# Patient Record
Sex: Female | Born: 1950
Health system: Southern US, Community
[De-identification: ages and names within clinical notes are randomized; demographics above are authoritative.]

## PROBLEM LIST (undated history)

## (undated) DIAGNOSIS — Z9889 Other specified postprocedural states: Secondary | ICD-10-CM

## (undated) DIAGNOSIS — Z8719 Personal history of other diseases of the digestive system: Secondary | ICD-10-CM

## (undated) DIAGNOSIS — I4891 Unspecified atrial fibrillation: Secondary | ICD-10-CM

## (undated) DIAGNOSIS — Z95810 Presence of automatic (implantable) cardiac defibrillator: Secondary | ICD-10-CM

## (undated) DIAGNOSIS — M1990 Unspecified osteoarthritis, unspecified site: Secondary | ICD-10-CM

## (undated) DIAGNOSIS — I959 Hypotension, unspecified: Secondary | ICD-10-CM

## (undated) DIAGNOSIS — R112 Nausea with vomiting, unspecified: Secondary | ICD-10-CM

## (undated) DIAGNOSIS — I639 Cerebral infarction, unspecified: Secondary | ICD-10-CM

## (undated) DIAGNOSIS — I447 Left bundle-branch block, unspecified: Secondary | ICD-10-CM

## (undated) DIAGNOSIS — I5020 Unspecified systolic (congestive) heart failure: Secondary | ICD-10-CM

## (undated) DIAGNOSIS — I340 Nonrheumatic mitral (valve) insufficiency: Secondary | ICD-10-CM

## (undated) DIAGNOSIS — E785 Hyperlipidemia, unspecified: Secondary | ICD-10-CM

## (undated) DIAGNOSIS — K219 Gastro-esophageal reflux disease without esophagitis: Secondary | ICD-10-CM

## (undated) HISTORY — PX: KNEE ARTHROSCOPY: SHX127

## (undated) HISTORY — PX: TONSILLECTOMY: SUR1361

---

## 1997-10-30 ENCOUNTER — Emergency Department (HOSPITAL_COMMUNITY): Admission: EM | Admit: 1997-10-30 | Discharge: 1997-10-30 | Payer: Self-pay | Admitting: Emergency Medicine

## 2001-09-23 ENCOUNTER — Encounter: Payer: Self-pay | Admitting: Emergency Medicine

## 2001-09-23 ENCOUNTER — Emergency Department (HOSPITAL_COMMUNITY): Admission: EM | Admit: 2001-09-23 | Discharge: 2001-09-23 | Payer: Self-pay | Admitting: Emergency Medicine

## 2001-10-08 ENCOUNTER — Emergency Department (HOSPITAL_COMMUNITY): Admission: EM | Admit: 2001-10-08 | Discharge: 2001-10-08 | Payer: Self-pay | Admitting: Emergency Medicine

## 2001-11-25 ENCOUNTER — Emergency Department (HOSPITAL_COMMUNITY): Admission: EM | Admit: 2001-11-25 | Discharge: 2001-11-26 | Payer: Self-pay | Admitting: Emergency Medicine

## 2002-02-03 ENCOUNTER — Emergency Department (HOSPITAL_COMMUNITY): Admission: EM | Admit: 2002-02-03 | Discharge: 2002-02-03 | Payer: Self-pay | Admitting: Emergency Medicine

## 2003-11-27 ENCOUNTER — Emergency Department (HOSPITAL_COMMUNITY): Admission: EM | Admit: 2003-11-27 | Discharge: 2003-11-27 | Payer: Self-pay | Admitting: Emergency Medicine

## 2004-08-17 ENCOUNTER — Emergency Department (HOSPITAL_COMMUNITY): Admission: EM | Admit: 2004-08-17 | Discharge: 2004-08-17 | Payer: Self-pay | Admitting: Emergency Medicine

## 2005-04-29 ENCOUNTER — Emergency Department (HOSPITAL_COMMUNITY): Admission: EM | Admit: 2005-04-29 | Discharge: 2005-04-29 | Payer: Self-pay | Admitting: Emergency Medicine

## 2007-04-15 ENCOUNTER — Ambulatory Visit: Payer: Self-pay | Admitting: Internal Medicine

## 2007-04-19 ENCOUNTER — Ambulatory Visit: Payer: Self-pay | Admitting: *Deleted

## 2007-07-31 ENCOUNTER — Encounter: Payer: Self-pay | Admitting: Family Medicine

## 2007-07-31 ENCOUNTER — Ambulatory Visit: Payer: Self-pay | Admitting: Internal Medicine

## 2007-07-31 LAB — CONVERTED CEMR LAB
Chlamydia, DNA Probe: NEGATIVE
GC Probe Amp, Genital: NEGATIVE

## 2007-08-07 ENCOUNTER — Ambulatory Visit (HOSPITAL_COMMUNITY): Admission: RE | Admit: 2007-08-07 | Discharge: 2007-08-07 | Payer: Self-pay | Admitting: Family Medicine

## 2007-08-15 ENCOUNTER — Encounter: Admission: RE | Admit: 2007-08-15 | Discharge: 2007-08-15 | Payer: Self-pay | Admitting: Family Medicine

## 2007-08-29 ENCOUNTER — Ambulatory Visit: Payer: Self-pay | Admitting: Internal Medicine

## 2007-08-29 ENCOUNTER — Encounter: Payer: Self-pay | Admitting: Family Medicine

## 2007-08-29 LAB — CONVERTED CEMR LAB
ALT: 10 units/L (ref 0–35)
AST: 14 units/L (ref 0–37)
Albumin: 4.2 g/dL (ref 3.5–5.2)
Alkaline Phosphatase: 90 units/L (ref 39–117)
BUN: 14 mg/dL (ref 6–23)
Basophils Absolute: 0 10*3/uL (ref 0.0–0.1)
Basophils Relative: 0 % (ref 0–1)
CO2: 24 meq/L (ref 19–32)
Calcium: 9.3 mg/dL (ref 8.4–10.5)
Chloride: 108 meq/L (ref 96–112)
Cholesterol: 172 mg/dL (ref 0–200)
Creatinine, Ser: 0.74 mg/dL (ref 0.40–1.20)
Eosinophils Absolute: 0.3 10*3/uL (ref 0.0–0.7)
Eosinophils Relative: 4 % (ref 0–5)
Glucose, Bld: 99 mg/dL (ref 70–99)
HCT: 39 % (ref 36.0–46.0)
HDL: 52 mg/dL (ref >39)
Helicobacter Pylori Antibody-IgG: >8 — ABNORMAL HIGH
Hemoglobin: 12.2 g/dL (ref 12.0–15.0)
LDL Cholesterol: 101 mg/dL — ABNORMAL HIGH (ref 0–99)
Lymphocytes Relative: 23 % (ref 12–46)
Lymphs Abs: 1.6 10*3/uL (ref 0.7–4.0)
MCHC: 31.3 g/dL (ref 30.0–36.0)
MCV: 93.3 fL (ref 78.0–100.0)
Monocytes Absolute: 0.5 10*3/uL (ref 0.1–1.0)
Monocytes Relative: 7 % (ref 3–12)
Neutro Abs: 4.5 10*3/uL (ref 1.7–7.7)
Neutrophils Relative %: 66 % (ref 43–77)
Platelets: 199 10*3/uL (ref 150–400)
Potassium: 4.1 meq/L (ref 3.5–5.3)
RBC: 4.18 M/uL (ref 3.87–5.11)
RDW: 13.9 % (ref 11.5–15.5)
Sodium: 142 meq/L (ref 135–145)
TSH: 2.472 microintl units/mL (ref 0.350–4.50)
Total Bilirubin: 0.4 mg/dL (ref 0.3–1.2)
Total CHOL/HDL Ratio: 3.3
Total Protein: 6.9 g/dL (ref 6.0–8.3)
Triglycerides: 97 mg/dL (ref <150)
VLDL: 19 mg/dL (ref 0–40)
WBC: 6.8 10*3/uL (ref 4.0–10.5)

## 2007-09-03 ENCOUNTER — Ambulatory Visit: Payer: Self-pay | Admitting: Internal Medicine

## 2007-09-16 ENCOUNTER — Ambulatory Visit: Payer: Self-pay | Admitting: Internal Medicine

## 2007-09-16 LAB — CONVERTED CEMR LAB
ALT: 9 units/L (ref 0–35)
AST: 14 units/L (ref 0–37)
Albumin: 4.3 g/dL (ref 3.5–5.2)
Alkaline Phosphatase: 83 units/L (ref 39–117)
BUN: 15 mg/dL (ref 6–23)
Basophils Absolute: 0 10*3/uL (ref 0.0–0.1)
Basophils Relative: 0 % (ref 0–1)
CO2: 22 meq/L (ref 19–32)
Calcium: 9.7 mg/dL (ref 8.4–10.5)
Chloride: 106 meq/L (ref 96–112)
Cholesterol: 171 mg/dL (ref 0–200)
Creatinine, Ser: 0.84 mg/dL (ref 0.40–1.20)
Eosinophils Absolute: 0.2 10*3/uL (ref 0.0–0.7)
Eosinophils Relative: 3 % (ref 0–5)
Glucose, Bld: 135 mg/dL — ABNORMAL HIGH (ref 70–99)
HCT: 37.2 % (ref 36.0–46.0)
HDL: 49 mg/dL (ref >39)
Hemoglobin: 12.1 g/dL (ref 12.0–15.0)
LDL Cholesterol: 111 mg/dL — ABNORMAL HIGH (ref 0–99)
Lymphocytes Relative: 28 % (ref 12–46)
Lymphs Abs: 1.9 10*3/uL (ref 0.7–4.0)
MCHC: 32.5 g/dL (ref 30.0–36.0)
MCV: 88.6 fL (ref 78.0–100.0)
Monocytes Absolute: 0.5 10*3/uL (ref 0.1–1.0)
Monocytes Relative: 7 % (ref 3–12)
Neutro Abs: 4.2 10*3/uL (ref 1.7–7.7)
Neutrophils Relative %: 62 % (ref 43–77)
Platelets: 221 10*3/uL (ref 150–400)
Potassium: 3.8 meq/L (ref 3.5–5.3)
RBC: 4.2 M/uL (ref 3.87–5.11)
RDW: 13.3 % (ref 11.5–15.5)
Sodium: 142 meq/L (ref 135–145)
Total Bilirubin: 0.8 mg/dL (ref 0.3–1.2)
Total CHOL/HDL Ratio: 3.5
Total Protein: 7.4 g/dL (ref 6.0–8.3)
Triglycerides: 57 mg/dL (ref <150)
VLDL: 11 mg/dL (ref 0–40)
WBC: 6.9 10*3/uL (ref 4.0–10.5)

## 2007-09-19 ENCOUNTER — Encounter: Payer: Self-pay | Admitting: Internal Medicine

## 2007-09-19 LAB — CONVERTED CEMR LAB: Hgb A1c MFr Bld: 5.1 % (ref 4.6–6.1)

## 2008-01-13 ENCOUNTER — Emergency Department (HOSPITAL_COMMUNITY): Admission: EM | Admit: 2008-01-13 | Discharge: 2008-01-13 | Payer: Self-pay | Admitting: Family Medicine

## 2008-11-20 ENCOUNTER — Ambulatory Visit: Payer: Self-pay | Admitting: Family Medicine

## 2008-11-20 ENCOUNTER — Encounter: Payer: Self-pay | Admitting: Family Medicine

## 2008-11-20 LAB — CONVERTED CEMR LAB
ALT: 11 units/L (ref 0–35)
AST: 15 units/L (ref 0–37)
Albumin: 4.6 g/dL (ref 3.5–5.2)
Alkaline Phosphatase: 95 units/L (ref 39–117)
BUN: 15 mg/dL (ref 6–23)
Basophils Absolute: 0 10*3/uL (ref 0.0–0.1)
Basophils Relative: 0 % (ref 0–1)
CO2: 23 meq/L (ref 19–32)
Calcium: 10.2 mg/dL (ref 8.4–10.5)
Chloride: 107 meq/L (ref 96–112)
Creatinine, Ser: 0.71 mg/dL (ref 0.40–1.20)
Eosinophils Absolute: 0.2 10*3/uL (ref 0.0–0.7)
Eosinophils Relative: 2 % (ref 0–5)
Glucose, Bld: 88 mg/dL (ref 70–99)
HCT: 39.6 % (ref 36.0–46.0)
Hemoglobin: 12.1 g/dL (ref 12.0–15.0)
Lymphocytes Relative: 28 % (ref 12–46)
Lymphs Abs: 2.3 10*3/uL (ref 0.7–4.0)
MCHC: 30.6 g/dL (ref 30.0–36.0)
MCV: 93.4 fL (ref 78.0–100.0)
Monocytes Absolute: 0.7 10*3/uL (ref 0.1–1.0)
Monocytes Relative: 8 % (ref 3–12)
Neutro Abs: 5 10*3/uL (ref 1.7–7.7)
Neutrophils Relative %: 62 % (ref 43–77)
Platelets: 225 10*3/uL (ref 150–400)
Potassium: 4 meq/L (ref 3.5–5.3)
Pro B Natriuretic peptide (BNP): 4 pg/mL (ref 0.0–100.0)
RBC: 4.24 M/uL (ref 3.87–5.11)
RDW: 13.6 % (ref 11.5–15.5)
Sodium: 141 meq/L (ref 135–145)
TSH: 4.078 microintl units/mL (ref 0.350–4.500)
Total Bilirubin: 0.6 mg/dL (ref 0.3–1.2)
Total Protein: 7.5 g/dL (ref 6.0–8.3)
Vit D, 25-Hydroxy: 40 ng/mL (ref 30–89)
WBC: 8.2 10*3/uL (ref 4.0–10.5)

## 2009-04-01 ENCOUNTER — Emergency Department (HOSPITAL_COMMUNITY): Admission: EM | Admit: 2009-04-01 | Discharge: 2009-04-01 | Payer: Self-pay | Admitting: Family Medicine

## 2009-10-13 ENCOUNTER — Emergency Department (HOSPITAL_COMMUNITY): Admission: EM | Admit: 2009-10-13 | Discharge: 2009-10-13 | Payer: Self-pay | Admitting: Emergency Medicine

## 2010-01-04 ENCOUNTER — Encounter (INDEPENDENT_AMBULATORY_CARE_PROVIDER_SITE_OTHER): Payer: Self-pay | Admitting: Family Medicine

## 2010-01-04 LAB — CONVERTED CEMR LAB
ALT: 15 units/L (ref 0–35)
AST: 18 units/L (ref 0–37)
Albumin: 4.1 g/dL (ref 3.5–5.2)
Alkaline Phosphatase: 109 units/L (ref 39–117)
BUN: 13 mg/dL (ref 6–23)
Basophils Absolute: 0 10*3/uL (ref 0.0–0.1)
Basophils Relative: 0 % (ref 0–1)
CO2: 24 meq/L (ref 19–32)
Calcium: 9.4 mg/dL (ref 8.4–10.5)
Chloride: 105 meq/L (ref 96–112)
Cholesterol: 207 mg/dL — ABNORMAL HIGH (ref 0–200)
Creatinine, Ser: 0.76 mg/dL (ref 0.40–1.20)
Eosinophils Absolute: 0.3 10*3/uL (ref 0.0–0.7)
Eosinophils Relative: 4 % (ref 0–5)
Glucose, Bld: 85 mg/dL (ref 70–99)
HCT: 34.6 % — ABNORMAL LOW (ref 36.0–46.0)
HDL: 43 mg/dL (ref >39)
Hemoglobin: 11.1 g/dL — ABNORMAL LOW (ref 12.0–15.0)
Hgb A1c MFr Bld: 5.5 % (ref <5.7)
LDL Cholesterol: 132 mg/dL — ABNORMAL HIGH (ref 0–99)
Lymphocytes Relative: 28 % (ref 12–46)
Lymphs Abs: 2.6 10*3/uL (ref 0.7–4.0)
MCHC: 32.1 g/dL (ref 30.0–36.0)
MCV: 89.6 fL (ref 78.0–100.0)
Monocytes Absolute: 0.7 10*3/uL (ref 0.1–1.0)
Monocytes Relative: 8 % (ref 3–12)
Neutro Abs: 5.6 10*3/uL (ref 1.7–7.7)
Neutrophils Relative %: 61 % (ref 43–77)
Platelets: 247 10*3/uL (ref 150–400)
Potassium: 4.1 meq/L (ref 3.5–5.3)
RBC: 3.86 M/uL — ABNORMAL LOW (ref 3.87–5.11)
RDW: 14.1 % (ref 11.5–15.5)
Sed Rate: 25 mm/hr — ABNORMAL HIGH (ref 0–22)
Sodium: 144 meq/L (ref 135–145)
Total Bilirubin: 0.4 mg/dL (ref 0.3–1.2)
Total CHOL/HDL Ratio: 4.8
Total Protein: 7 g/dL (ref 6.0–8.3)
Triglycerides: 162 mg/dL — ABNORMAL HIGH (ref <150)
VLDL: 32 mg/dL (ref 0–40)
WBC: 9.2 10*3/uL (ref 4.0–10.5)

## 2010-01-07 ENCOUNTER — Ambulatory Visit (HOSPITAL_COMMUNITY)
Admission: RE | Admit: 2010-01-07 | Discharge: 2010-01-07 | Payer: Self-pay | Source: Home / Self Care | Attending: Internal Medicine | Admitting: Internal Medicine

## 2010-02-07 ENCOUNTER — Encounter: Payer: Self-pay | Admitting: Family Medicine

## 2010-06-27 ENCOUNTER — Ambulatory Visit (INDEPENDENT_AMBULATORY_CARE_PROVIDER_SITE_OTHER): Payer: Self-pay

## 2010-06-27 ENCOUNTER — Inpatient Hospital Stay (INDEPENDENT_AMBULATORY_CARE_PROVIDER_SITE_OTHER)
Admission: RE | Admit: 2010-06-27 | Discharge: 2010-06-27 | Disposition: A | Discharge status: Home / Self Care | Payer: Self-pay | Source: Ambulatory Visit | Attending: Emergency Medicine | Admitting: Emergency Medicine

## 2010-06-27 DIAGNOSIS — M79609 Pain in unspecified limb: Secondary | ICD-10-CM

## 2010-09-29 ENCOUNTER — Inpatient Hospital Stay (INDEPENDENT_AMBULATORY_CARE_PROVIDER_SITE_OTHER)
Admission: RE | Admit: 2010-09-29 | Discharge: 2010-09-29 | Disposition: A | Discharge status: Home / Self Care | Payer: Self-pay | Source: Ambulatory Visit | Attending: Emergency Medicine | Admitting: Emergency Medicine

## 2010-09-29 ENCOUNTER — Ambulatory Visit (INDEPENDENT_AMBULATORY_CARE_PROVIDER_SITE_OTHER): Payer: Self-pay

## 2010-09-29 DIAGNOSIS — M779 Enthesopathy, unspecified: Secondary | ICD-10-CM

## 2010-12-19 ENCOUNTER — Encounter (HOSPITAL_BASED_OUTPATIENT_CLINIC_OR_DEPARTMENT_OTHER)
Admission: RE | Admit: 2010-12-19 | Discharge: 2010-12-19 | Disposition: A | Discharge status: Home / Self Care | Payer: Worker's Compensation | Source: Ambulatory Visit | Attending: Orthopedic Surgery | Admitting: Orthopedic Surgery

## 2010-12-19 ENCOUNTER — Other Ambulatory Visit: Payer: Self-pay | Admitting: Orthopedic Surgery

## 2010-12-19 ENCOUNTER — Encounter (HOSPITAL_BASED_OUTPATIENT_CLINIC_OR_DEPARTMENT_OTHER): Payer: Self-pay | Admitting: *Deleted

## 2010-12-19 NOTE — Progress Notes (Signed)
bmet-ekg done

## 2010-12-21 ENCOUNTER — Encounter (HOSPITAL_BASED_OUTPATIENT_CLINIC_OR_DEPARTMENT_OTHER)
Admission: RE | Disposition: A | Discharge status: Home / Self Care | Payer: Self-pay | Source: Ambulatory Visit | Attending: Orthopedic Surgery

## 2010-12-21 ENCOUNTER — Encounter (HOSPITAL_BASED_OUTPATIENT_CLINIC_OR_DEPARTMENT_OTHER): Payer: Self-pay | Admitting: *Deleted

## 2010-12-21 ENCOUNTER — Ambulatory Visit (HOSPITAL_BASED_OUTPATIENT_CLINIC_OR_DEPARTMENT_OTHER): Payer: Worker's Compensation | Admitting: Anesthesiology

## 2010-12-21 ENCOUNTER — Encounter (HOSPITAL_BASED_OUTPATIENT_CLINIC_OR_DEPARTMENT_OTHER): Payer: Self-pay | Admitting: Anesthesiology

## 2010-12-21 ENCOUNTER — Ambulatory Visit (HOSPITAL_BASED_OUTPATIENT_CLINIC_OR_DEPARTMENT_OTHER)
Admission: RE | Admit: 2010-12-21 | Discharge: 2010-12-21 | Disposition: A | Discharge status: Home / Self Care | Payer: Worker's Compensation | Source: Ambulatory Visit | Attending: Orthopedic Surgery | Admitting: Orthopedic Surgery

## 2010-12-21 DIAGNOSIS — K219 Gastro-esophageal reflux disease without esophagitis: Secondary | ICD-10-CM | POA: Insufficient documentation

## 2010-12-21 DIAGNOSIS — M24139 Other articular cartilage disorders, unspecified wrist: Secondary | ICD-10-CM | POA: Insufficient documentation

## 2010-12-21 DIAGNOSIS — M24439 Recurrent dislocation, unspecified wrist: Secondary | ICD-10-CM | POA: Insufficient documentation

## 2010-12-21 DIAGNOSIS — Z5333 Arthroscopic surgical procedure converted to open procedure: Secondary | ICD-10-CM | POA: Insufficient documentation

## 2010-12-21 DIAGNOSIS — Z01812 Encounter for preprocedural laboratory examination: Secondary | ICD-10-CM | POA: Insufficient documentation

## 2010-12-21 HISTORY — DX: Unspecified osteoarthritis, unspecified site: M19.90

## 2010-12-21 HISTORY — DX: Gastro-esophageal reflux disease without esophagitis: K21.9

## 2010-12-21 HISTORY — PX: WRIST ARTHROSCOPY: SHX838

## 2010-12-21 HISTORY — DX: Other specified postprocedural states: Z98.890

## 2010-12-21 HISTORY — DX: Other specified postprocedural states: R11.2

## 2010-12-21 SURGERY — ARTHROSCOPY, WRIST
Anesthesia: General | Site: Wrist | Laterality: Right | Wound class: Clean

## 2010-12-21 MED ORDER — MIDAZOLAM HCL 5 MG/5ML IJ SOLN
INTRAMUSCULAR | Status: DC | PRN
  Administered 2010-12-21: 2 mg via INTRAVENOUS

## 2010-12-21 MED ORDER — PROPOFOL 10 MG/ML IV EMUL
INTRAVENOUS | Status: DC | PRN
  Administered 2010-12-21: 200 mg via INTRAVENOUS

## 2010-12-21 MED ORDER — MORPHINE SULFATE 2 MG/ML IJ SOLN: 0.0500 mg/kg | INTRAMUSCULAR | Status: DC | PRN

## 2010-12-21 MED ORDER — FENTANYL CITRATE 0.05 MG/ML IJ SOLN
INTRAMUSCULAR | Status: DC | PRN
  Administered 2010-12-21: 50 ug via INTRAVENOUS
  Administered 2010-12-21 (×5): 25 ug via INTRAVENOUS

## 2010-12-21 MED ORDER — CEFAZOLIN SODIUM 1-5 GM-% IV SOLN
INTRAVENOUS | Status: DC | PRN
  Administered 2010-12-21: 1 g via INTRAVENOUS

## 2010-12-21 MED ORDER — CHLORHEXIDINE GLUCONATE 4 % EX LIQD: 60.0000 mL | Freq: Once | CUTANEOUS | Status: DC

## 2010-12-21 MED ORDER — ONDANSETRON HCL 4 MG/2ML IJ SOLN
INTRAMUSCULAR | Status: DC | PRN
  Administered 2010-12-21: 4 mg via INTRAVENOUS

## 2010-12-21 MED ORDER — DEXAMETHASONE SODIUM PHOSPHATE 4 MG/ML IJ SOLN
INTRAMUSCULAR | Status: DC | PRN
  Administered 2010-12-21: 10 mg via INTRAVENOUS

## 2010-12-21 MED ORDER — LACTATED RINGERS IV SOLN
INTRAVENOUS | Status: DC
  Administered 2010-12-21 (×2): via INTRAVENOUS

## 2010-12-21 MED ORDER — MIDAZOLAM HCL 2 MG/2ML IJ SOLN: 0.5000 mg | Freq: Once | INTRAMUSCULAR | Status: DC | PRN

## 2010-12-21 MED ORDER — BUPIVACAINE HCL (PF) 0.25 % IJ SOLN
INTRAMUSCULAR | Status: DC | PRN
  Administered 2010-12-21: 10 mL

## 2010-12-21 MED ORDER — OXYCODONE-ACETAMINOPHEN 5-325 MG PO TABS: 1.0000 | ORAL_TABLET | ORAL | Status: AC | PRN

## 2010-12-21 MED ORDER — PROMETHAZINE HCL 25 MG/ML IJ SOLN: 6.2500 mg | INTRAMUSCULAR | Status: DC | PRN

## 2010-12-21 MED ORDER — HYDROMORPHONE HCL PF 1 MG/ML IJ SOLN
0.2500 mg | INTRAMUSCULAR | Status: DC | PRN
  Administered 2010-12-21 (×2): 0.5 mg via INTRAVENOUS

## 2010-12-21 MED ORDER — LIDOCAINE HCL (CARDIAC) 20 MG/ML IV SOLN
INTRAVENOUS | Status: DC | PRN
  Administered 2010-12-21: 40 mg via INTRAVENOUS

## 2010-12-21 SURGICAL SUPPLY — 76 items
BANDAGE GAUZE ELAST BULKY 4 IN (GAUZE/BANDAGES/DRESSINGS) ×2 IMPLANT
BLADE CUDA 2.0 (BLADE) IMPLANT
BLADE EAR TYMPAN 2.5 60D BEAV (BLADE) ×2 IMPLANT
BLADE MINI RND TIP GREEN BEAV (BLADE) ×4 IMPLANT
BLADE SURG 15 STRL LF DISP TIS (BLADE) ×1 IMPLANT
BLADE SURG 15 STRL SS (BLADE) ×1
BNDG COHESIVE 3X5 TAN STRL LF (GAUZE/BANDAGES/DRESSINGS) ×4 IMPLANT
BNDG ESMARK 4X9 LF (GAUZE/BANDAGES/DRESSINGS) ×2 IMPLANT
BUR CUDA 2.9 (BURR) IMPLANT
BUR FULL RADIUS 2.0 (BURR) IMPLANT
BUR FULL RADIUS 2.9 (BURR) ×2 IMPLANT
BUR GATOR 2.9 (BURR) IMPLANT
BUR SPHERICAL 2.9 (BURR) IMPLANT
CANISTER OMNI JUG 16 LITER (MISCELLANEOUS) IMPLANT
CANISTER SUCTION 1200CC (MISCELLANEOUS) ×2 IMPLANT
CANISTER SUCTION 2500CC (MISCELLANEOUS) IMPLANT
CHLORAPREP W/TINT 26ML (MISCELLANEOUS) ×2 IMPLANT
CLOTH BEACON ORANGE TIMEOUT ST (SAFETY) ×2 IMPLANT
CORDS BIPOLAR (ELECTRODE) ×2 IMPLANT
COVER MAYO STAND STRL (DRAPES) ×4 IMPLANT
COVER TABLE BACK 60X90 (DRAPES) ×2 IMPLANT
CUFF TOURNIQUET SINGLE 18IN (TOURNIQUET CUFF) ×2 IMPLANT
DRAPE EXTREMITY T 121X128X90 (DRAPE) ×2 IMPLANT
DRAPE OEC MINIVIEW 54X84 (DRAPES) IMPLANT
DRAPE SURG 17X23 STRL (DRAPES) ×2 IMPLANT
DRSG KUZMA FLUFF (GAUZE/BANDAGES/DRESSINGS) IMPLANT
ELECT SMALL JOINT 90D BASC (ELECTRODE) IMPLANT
GAUZE XEROFORM 1X8 LF (GAUZE/BANDAGES/DRESSINGS) ×2 IMPLANT
GLOVE BIO SURGEON STRL SZ 6.5 (GLOVE) IMPLANT
GLOVE BIOGEL PI IND STRL 6.5 (GLOVE) ×1 IMPLANT
GLOVE BIOGEL PI IND STRL 7.0 (GLOVE) ×2 IMPLANT
GLOVE BIOGEL PI INDICATOR 6.5 (GLOVE) ×1
GLOVE BIOGEL PI INDICATOR 7.0 (GLOVE) ×2
GLOVE ECLIPSE 6.5 STRL STRAW (GLOVE) ×8 IMPLANT
GLOVE SURG ORTHO 8.0 STRL STRW (GLOVE) ×2 IMPLANT
GOWN BRE IMP PREV XXLGXLNG (GOWN DISPOSABLE) ×2 IMPLANT
GOWN PREVENTION PLUS XLARGE (GOWN DISPOSABLE) ×8 IMPLANT
IV NS IRRIG 3000ML ARTHROMATIC (IV SOLUTION) ×2 IMPLANT
NDL SAFETY ECLIPSE 18X1.5 (NEEDLE) ×2 IMPLANT
NEEDLE HYPO 18GX1.5 SHARP (NEEDLE) ×2
NEEDLE HYPO 22GX1.5 SAFETY (NEEDLE) ×2 IMPLANT
NEEDLE SPNL 18GX3.5 QUINCKE PK (NEEDLE) IMPLANT
NEEDLE TUOHY 20GX3.5 (NEEDLE) IMPLANT
NS IRRIG 1000ML POUR BTL (IV SOLUTION) IMPLANT
PACK BASIN DAY SURGERY FS (CUSTOM PROCEDURE TRAY) ×2 IMPLANT
PAD CAST 3X4 CTTN HI CHSV (CAST SUPPLIES) ×2 IMPLANT
PADDING CAST ABS 3INX4YD NS (CAST SUPPLIES) ×1
PADDING CAST ABS 4INX4YD NS (CAST SUPPLIES) ×1
PADDING CAST ABS COTTON 3X4 (CAST SUPPLIES) ×1 IMPLANT
PADDING CAST ABS COTTON 4X4 ST (CAST SUPPLIES) ×1 IMPLANT
PADDING CAST COTTON 3X4 STRL (CAST SUPPLIES) ×2
ROUTER HOODED VORTEX 2.9MM (BLADE) IMPLANT
SET ARTHROSCOPY TUBING (MISCELLANEOUS) ×1
SET ARTHROSCOPY TUBING LN (MISCELLANEOUS) ×1 IMPLANT
SET EXT MALE ROTATING LL 32IN (MISCELLANEOUS) ×2 IMPLANT
SET SM JOINT TUBING/CANN (CANNULA) IMPLANT
SLEEVE SCD COMPRESS KNEE MED (MISCELLANEOUS) ×2 IMPLANT
SPLINT PLASTER CAST XFAST 3X15 (CAST SUPPLIES) ×30 IMPLANT
SPLINT PLASTER XTRA FASTSET 3X (CAST SUPPLIES) ×30
SPONGE GAUZE 4X4 12PLY (GAUZE/BANDAGES/DRESSINGS) ×2 IMPLANT
STOCKINETTE 4X48 STRL (DRAPES) ×2 IMPLANT
SUCTION FRAZIER TIP 10 FR DISP (SUCTIONS) IMPLANT
SUT MERSILENE 4 0 P 3 (SUTURE) IMPLANT
SUT PDS AB 2-0 CT2 27 (SUTURE) IMPLANT
SUT STEEL 4 0 (SUTURE) ×2 IMPLANT
SUT VIC AB 2-0 PS2 27 (SUTURE) IMPLANT
SUT VIC AB 4-0 P2 18 (SUTURE) ×2 IMPLANT
SUT VICRYL 4-0 PS2 18IN ABS (SUTURE) IMPLANT
SUT VICRYL RAPID 5 0 P 3 (SUTURE) IMPLANT
SUT VICRYL RAPIDE 4/0 PS 2 (SUTURE) ×2 IMPLANT
SYR BULB 3OZ (MISCELLANEOUS) IMPLANT
SYR CONTROL 10ML LL (SYRINGE) ×2 IMPLANT
TUBE CONNECTING 20X1/4 (TUBING) IMPLANT
UNDERPAD 30X30 INCONTINENT (UNDERPADS AND DIAPERS) ×2 IMPLANT
WAND 1.5 MICROBLATOR (SURGICAL WAND) IMPLANT
WATER STERILE IRR 1000ML POUR (IV SOLUTION) ×2 IMPLANT

## 2010-12-21 NOTE — Anesthesia Procedure Notes (Signed)
Procedure Name: LMA Insertion Date/Time: 12/21/2010 8:50 AM Performed by: Signa Kell Pre-anesthesia Checklist: Patient identified, Emergency Drugs available, Suction available and Patient being monitored Patient Re-evaluated:Patient Re-evaluated prior to inductionOxygen Delivery Method: Circle System Utilized Preoxygenation: Pre-oxygenation with 100% oxygen Intubation Type: IV induction Ventilation: Mask ventilation without difficulty LMA: LMA with gastric port inserted LMA Size: 4.0 Number of attempts: 1 Airway Equipment and Method: bite block Placement Confirmation: positive ETCO2 Tube secured with: Tape Dental Injury: Teeth and Oropharynx as per pre-operative assessment

## 2010-12-21 NOTE — Brief Op Note (Signed)
12/21/2010  11:02 AM  PATIENT:  [name]  60 y.o. female  PRE-OPERATIVE DIAGNOSIS:  tfcc tear on right, subluxating ecu  POST-OPERATIVE DIAGNOSIS:  TFCC tear Right Wrist, Localized Arthritis   PROCEDURE:  Procedure(s): ARTHROSCOPY WRIST  SURGEON:  Surgeon(s): [name], MD  PHYSICIAN ASSISTANT:   ASSISTANTS: none   ANESTHESIA:   local and general  EBL:  Total I/O In: 1300 [I.V.:1300] Out: -   BLOOD ADMINISTERED:none  DRAINS: none   LOCAL MEDICATIONS USED:  MARCAINE 10CC  SPECIMEN:  No Specimen  DISPOSITION OF SPECIMEN:  N/A  COUNTS:  YES  TOURNIQUET:   Total Tourniquet Time Documented: Upper Arm (Right) - 75 minutes  DICTATION: .Other Dictation: Dictation Number [id number]  PLAN OF CARE: Discharge to home after PACU  PATIENT DISPOSITION:  PACU - hemodynamically stable.   Delay start of Pharmacological VTE agent (>24hrs) due to surgical blood loss or risk of bleeding:  {YES/NO/NOT APPLICABLE:20182

## 2010-12-21 NOTE — H&P (Signed)
[name] is a 60 year old right hand dominant female referred by the insurance company. She suffered an injury to her right hand using a hole punch. The injury occurred on 09-27-10. She complains of pain about her entire hand and wrist. She localizes this over the ulnar aspect and to a lesser extent on the radial side of the basilar area of her thumb. She was given Hydrocodone and placed in a splint which she states has not significantly helped. This has been at [facility].  X-rays reveal no fractures.   She has no prior history of injuries. No history of diabetes, thyroid problems, arthritis or gout. She complains of constant severe, throbbing and aching type pain with a feeling of swelling and weakness. Activity makes it worse. It will occasionally awaken her from sleep. She is not complaining of any numbness or tingling. Dorsiflexion increases her pain on the ulnar aspect.   Past Medical History: She has no allergies. She is on Rampadine, Zestril. She has had an arthroscopy of her knee in 1992.   Family Medical History: Positive for diabetes and high BP.  Social History: She does not smoke or drink. She is married and a Conservation officer, nature for [employer]  Review of Systems: Positive for glasses, otherwise negative for 14 points.  .  We have discussed with her arthroscopy of her wrist with debridement, repair triangular fibrocartilage with open reconstruction extensor carpi ulnaris sheath.  The risks and complications have been discussed.  I would not recommend doing anything to the ulnar nerve, this is to her right wrist.  She is scheduled for arthroscopy right wrist open reconstruction ECU tendon sheath with repair triangular fibrocartilage right wrist as outpatient at [facility].  She is allowed one-handed work if available, if not, out.  She is given a refill of Tylenol-3 #30, no refills.  [name] is an 60 y.o. female.   Chief Complaint: rt wrist pain HPI: see above  Past Medical  History  Diagnosis Date  . GERD (gastroesophageal reflux disease)   . Arthritis   . Complication of anesthesia   . PONV (postoperative nausea and vomiting)   . No pertinent past medical history     Past Surgical History  Procedure Date  . Tonsillectomy   . Knee arthroscopy     rt    History reviewed. No pertinent family history. Social History:  reports that she has never smoked. She does not have any smokeless tobacco history on file. She reports that she drinks alcohol. She reports that she does not use illicit drugs.  Allergies: No Known Allergies  Medications Prior to Admission  Medication Dose Route Frequency Provider Last Rate Last Dose  . lactated ringers infusion   Intravenous Continuous Zenon Mayo, MD 10 mL/hr at 12/21/10 [phone]     Medications Prior to Admission  Medication Sig Dispense Refill  . acetaminophen-codeine (TYLENOL #3) 300-30 MG per tablet Take 1 tablet by mouth every 4 (four) hours as needed.        . famotidine (PEPCID) 20 MG tablet Take 20 mg by mouth 2 (two) times daily as needed.        [name] levocetirizine (XYZAL) 5 MG tablet Take 5 mg by mouth 1 day or 1 dose.          Results for orders placed during the hospital encounter of 12/21/10 (from the past 48 hour(s))  POCT HEMOGLOBIN-HEMACUE     Status: Abnormal   Collection Time   12/21/10  7:24 AM      Component Value Range Comment   Hemoglobin 11.7 (*) 12.0 - 15.0 (g/dL)     No results found.   Pertinent items are noted in HPI.  Blood pressure 135/82, pulse 67, temperature 98.4 F (36.9 C), temperature source Oral, resp. rate 18, height 5' 4.5" (1.638 m), weight 68.04 kg (150 lb), SpO2 100.00%.  General appearance: alert, cooperative and appears stated age Head: Normocephalic, without obvious abnormality Neck: no adenopathy Resp: clear to auscultation bilaterally Cardio: regular rate and rhythm, S1, S2 normal, no murmur, click, rub or gallop GI: soft, non-tender; bowel sounds normal;  no masses,  no organomegaly Extremities: extremities normal, atraumatic, no cyanosis or edema Pulses: 2+ and symmetric Skin: Skin color, texture, turgor normal. No rashes or lesions Neurologic: Grossly normal Incision/Wound: na  Assessment/Plan Arthroscopy rt writ open repair ECU sheath  Alexandru Moorer R 12/21/2010, 8:34 AM

## 2010-12-21 NOTE — Transfer of Care (Signed)
Immediate Anesthesia Transfer of Care Note  Patient: [name]  Procedure(s) Performed:  ARTHROSCOPY WRIST - right wrist, repair TFCC on right, open reconstruction ECU sheath  Patient Location: PACU  Anesthesia Type: General  Level of Consciousness: sedated  Airway & Oxygen Therapy: Patient Spontanous Breathing and Patient connected to face mask  Post-op Assessment: Report given to PACU RN and Post -op Vital signs reviewed and stable  Post vital signs: Reviewed and stable  Complications: No apparent anesthesia complications

## 2010-12-21 NOTE — Anesthesia Postprocedure Evaluation (Signed)
  Anesthesia Post-op Note  Patient: [name]  Procedure(s) Performed:  ARTHROSCOPY WRIST - right wrist, repair TFCC on right, open reconstruction ECU sheath  Patient Location: PACU  Anesthesia Type: General  Level of Consciousness: awake  Airway and Oxygen Therapy: Patient Spontanous Breathing  Post-op Pain: none  Post-op Assessment: Post-op Vital signs reviewed  Post-op Vital Signs: stable  Complications: No apparent anesthesia complications

## 2010-12-21 NOTE — Op Note (Signed)
dictation number: [id number]

## 2010-12-21 NOTE — Anesthesia Preprocedure Evaluation (Addendum)
Anesthesia Evaluation  Patient identified by MRN, date of birth, ID band Patient awake    Reviewed: Allergy & Precautions, H&P , NPO status   History of Anesthesia Complications (+) PONV  Airway Mallampati: II      Dental  (+) Dental Advisory Given   Pulmonary neg pulmonary ROS,  clear to auscultation        Cardiovascular neg cardio ROS Regular Normal    Neuro/Psych    GI/Hepatic Neg liver ROS, GERD-  ,  Endo/Other  Negative Endocrine ROS  Renal/GU negative Renal ROS     Musculoskeletal negative musculoskeletal ROS (+)   Abdominal   Peds  Hematology negative hematology ROS (+)   Anesthesia Other Findings   Reproductive/Obstetrics                           Anesthesia Physical Anesthesia Plan  ASA: II  Anesthesia Plan: General   Post-op Pain Management:    Induction: Intravenous  Airway Management Planned: LMA  Additional Equipment:   Intra-op Plan:   Post-operative Plan: Extubation in OR  Informed Consent: I have reviewed the patients History and Physical, chart, labs and discussed the procedure including the risks, benefits and alternatives for the proposed anesthesia with the patient or authorized representative who has indicated his/her understanding and acceptance.   Dental advisory given  Plan Discussed with: CRNA  Anesthesia Plan Comments:         Anesthesia Quick Evaluation

## 2010-12-22 NOTE — Op Note (Signed)
[name], [name]               ACCOUNT NO.:  [id number]  MEDICAL RECORD NO.:  [id number]  LOCATION:                                 FACILITY:  PHYSICIAN:  Cindee Salt, M.D.       DATE OF BIRTH:  [birth year]  DATE OF PROCEDURE:  12/21/2010 DATE OF DISCHARGE:                              OPERATIVE REPORT   PREOPERATIVE DIAGNOSIS:  Triangular fibrocartilage complex tear with subluxation of extensor carpi ulnaris tendon sheath, right wrist.  POSTOPERATIVE DIAGNOSIS:  Triangular fibrocartilage complex tear with subluxation of extensor carpi ulnaris tendon sheath, right wrist.  OPERATION:  Arthroscopy of right wrist with debridement of TFCC central tear with repair of peripheral tear, open repair of the ECU sheath and TFCC, right wrist.  SURGEON:  Cindee Salt, M.D.  ANESTHESIA:  General with local infiltration.  PROCEDURE:  The patient was brought to the operating room where a general anesthetic was carried out without difficulty.  She was prepped using ChloraPrep, supine position, with the right arm free.  A 3-minute dry time was allowed.  Time-out taken for patient procedure.  The limb was placed in the arthroscopy tower.  10 pounds traction applied.  The joint inflated with 3-4 portal.  A transverse incision was made, deepened with a hemostat.  Blunt trocar was used to enter the joint. Joint was inspected.  The volar, radial, wrist ligaments were intact. The scapholunate ligament intact.  Cartilage showed no changes.  A tear of the TFCC was noted on its central aspect and also peripherally on the dorsal margin.  A small tear at the lunotriquetral joint was also noted. The midcarpal joint was then inspected after inflation through the ulnar midcarpal portal.  An irrigation catheter placed in the radial midcarpal portal.  The midcarpal joint was inspected.  There were no cartilage changes and no gross instability of scapholunate and lunotriquetral ligaments.  The scope was then  reintroduced centrally in the radial portal.  A debridement performed to the peripheral tear of the TFCC using a full radius shaver.  The central area was not debrided at this time.  The small tear of the lunotriquetral joint was also debrided with the full radius shaver.  The limb was then exsanguinated with an Esmarch bandage.  Tourniquet placed high on the arm, was inflated to 250 mmHg. An incision was made over the ulnar aspect of the wrist, carried down through subcutaneous tissue.  Bleeders were electrocauterized with bipolar.  Dorsal sensory nerve identified and protected.  The ECU sheath was then incised on its most volar aspect, leaving a cuff of tissue for later repair.  The TFCC was then isolated and distal margins.  Using an outside-in Whipple technique, two 18-gauge needles were placed.  These were inspected through the 3-4 portal.  Using the arthroscope, a repair was performed with two 4-0 Vicryl sutures into the peripheral tear. This was not repaired at this time.  A debridement of the TFCC was then performed, taken care to protect the 2 sutures.  The sutures were then tied over the dorsal capsule peripherally stabilizing the triangular fibrocartilage complex on the dorsal aspect.  Wound was irrigated.  The ECU sheath was  then repaired with figure-of-eight 4-0 Vicryl sutures. The subcutaneous tissue was repaired with interrupted 4-0 Vicryl and the skin with interrupted 4-0 Vicryl Rapide sutures.  Local infiltration to each of the incision was then made, 10 mL of Marcaine 0.25% plain was used.  A sterile compressive dressing, long-arm splint applied.  On deflation of the tourniquet, all fingers immediately pinked.  She was taken to the recovery room for observation in satisfactory condition.          ______________________________ Cindee Salt, M.D.     GK/MEDQ  D:  12/21/2010  T:  12/21/2010  Job:  [id number]

## 2010-12-23 ENCOUNTER — Encounter (HOSPITAL_BASED_OUTPATIENT_CLINIC_OR_DEPARTMENT_OTHER): Payer: Self-pay | Admitting: Orthopedic Surgery

## 2012-12-10 ENCOUNTER — Ambulatory Visit: Payer: Worker's Compensation | Attending: Internal Medicine

## 2013-01-20 ENCOUNTER — Ambulatory Visit: Payer: Worker's Compensation | Admitting: Internal Medicine

## 2013-02-21 ENCOUNTER — Ambulatory Visit: Payer: Worker's Compensation | Admitting: Internal Medicine

## 2013-04-09 ENCOUNTER — Ambulatory Visit: Payer: No Typology Code available for payment source | Attending: Internal Medicine | Admitting: Internal Medicine

## 2013-04-09 ENCOUNTER — Telehealth: Payer: Self-pay | Admitting: Internal Medicine

## 2013-04-09 VITALS — BP 149/94 | HR 81 | Temp 98.5°F | Resp 16 | Ht 64.0 in | Wt 181.0 lb

## 2013-04-09 DIAGNOSIS — Z8249 Family history of ischemic heart disease and other diseases of the circulatory system: Secondary | ICD-10-CM | POA: Insufficient documentation

## 2013-04-09 DIAGNOSIS — Z008 Encounter for other general examination: Secondary | ICD-10-CM | POA: Insufficient documentation

## 2013-04-09 DIAGNOSIS — Z0000 Encounter for general adult medical examination without abnormal findings: Secondary | ICD-10-CM | POA: Insufficient documentation

## 2013-04-09 DIAGNOSIS — Z23 Encounter for immunization: Secondary | ICD-10-CM

## 2013-04-09 DIAGNOSIS — K219 Gastro-esophageal reflux disease without esophagitis: Secondary | ICD-10-CM | POA: Insufficient documentation

## 2013-04-09 LAB — COMPLETE METABOLIC PANEL WITH GFR
ALK PHOS: 100 U/L (ref 39–117)
ALT: 15 U/L (ref 0–35)
AST: 19 U/L (ref 0–37)
Albumin: 4.2 g/dL (ref 3.5–5.2)
BUN: 13 mg/dL (ref 6–23)
CALCIUM: 9.3 mg/dL (ref 8.4–10.5)
CHLORIDE: 106 meq/L (ref 96–112)
CO2: 26 mEq/L (ref 19–32)
CREATININE: 0.71 mg/dL (ref 0.50–1.10)
GFR, Est African American: >89 mL/min
GFR, Est Non African American: >89 mL/min
Glucose, Bld: 83 mg/dL (ref 70–99)
Potassium: 4.6 mEq/L (ref 3.5–5.3)
Sodium: 143 mEq/L (ref 135–145)
Total Bilirubin: 1 mg/dL (ref 0.2–1.2)
Total Protein: 7 g/dL (ref 6.0–8.3)

## 2013-04-09 LAB — CBC WITH DIFFERENTIAL/PLATELET
BASOS ABS: 0 10*3/uL (ref 0.0–0.1)
BASOS PCT: 0 % (ref 0–1)
EOS PCT: 3 % (ref 0–5)
Eosinophils Absolute: 0.2 10*3/uL (ref 0.0–0.7)
HCT: 38.6 % (ref 36.0–46.0)
Hemoglobin: 13.3 g/dL (ref 12.0–15.0)
LYMPHS PCT: 26 % (ref 12–46)
Lymphs Abs: 1.9 10*3/uL (ref 0.7–4.0)
MCH: 28.7 pg (ref 26.0–34.0)
MCHC: 34.5 g/dL (ref 30.0–36.0)
MCV: 83.4 fL (ref 78.0–100.0)
Monocytes Absolute: 0.5 10*3/uL (ref 0.1–1.0)
Monocytes Relative: 7 % (ref 3–12)
NEUTROS ABS: 4.7 10*3/uL (ref 1.7–7.7)
Neutrophils Relative %: 64 % (ref 43–77)
PLATELETS: 200 10*3/uL (ref 150–400)
RBC: 4.63 MIL/uL (ref 3.87–5.11)
RDW: 14.1 % (ref 11.5–15.5)
WBC: 7.4 10*3/uL (ref 4.0–10.5)

## 2013-04-09 LAB — LIPID PANEL
CHOL/HDL RATIO: 4.7 ratio
CHOLESTEROL: 192 mg/dL (ref 0–200)
HDL: 41 mg/dL (ref >39)
LDL Cholesterol: 133 mg/dL — ABNORMAL HIGH (ref 0–99)
TRIGLYCERIDES: 91 mg/dL (ref <150)
VLDL: 18 mg/dL (ref 0–40)

## 2013-04-09 LAB — TSH: TSH: 4.053 u[IU]/mL (ref 0.350–4.500)

## 2013-04-09 LAB — POCT GLYCOSYLATED HEMOGLOBIN (HGB A1C): Hemoglobin A1C: 5

## 2013-04-09 NOTE — Progress Notes (Signed)
Pt is here to establish care. Pt reports having occasional dizziness and blurred vision.

## 2013-04-09 NOTE — Progress Notes (Signed)
Patient ID: [name], female   DOB: 1950/07/01, 63 y.o.   MRN: [id number]  CC: New patient  HPI: 63 year old female with no significant past medical history who presented to clinic to establish primary care provider. Patient is not on any medications and currently feels well. She reports occasional pain in ankles but says she has arthritis. She is able to walk with no assistance. No history of falls. No bleeds. No chest pain.   No Known Allergies Past Medical History  Diagnosis Date  . GERD (gastroesophageal reflux disease)   . Arthritis   . Complication of anesthesia   . PONV (postoperative nausea and vomiting)   . No pertinent past medical history    Current Outpatient Prescriptions on File Prior to Visit  Medication Sig Dispense Refill  . famotidine (PEPCID) 20 MG tablet Take 20 mg by mouth 2 (two) times daily as needed.        [name] levocetirizine (XYZAL) 5 MG tablet Take 5 mg by mouth 1 day or 1 dose.         No current facility-administered medications on file prior to visit.   Hypertension in family.  History   Social History  . Marital Status: Married    Spouse Name: N/A    Number of Children: N/A  . Years of Education: N/A   Occupational History  . Not on file.   Social History Main Topics  . Smoking status: Never Smoker   . Smokeless tobacco: Not on file  . Alcohol Use: Yes  . Drug Use: No  . Sexual Activity:    Other Topics Concern  . Not on file   Social History Narrative  . No narrative on file    Review of Systems  Constitutional: Negative for fever, chills, diaphoresis, activity change, appetite change and fatigue.  HENT: Negative for ear pain, nosebleeds, congestion, facial swelling, rhinorrhea, neck pain, neck stiffness and ear discharge.   Eyes: Negative for pain, discharge, redness, itching and visual disturbance.  Respiratory: Negative for cough, choking, chest tightness, shortness of breath, wheezing and stridor.   Cardiovascular: Negative  for chest pain, palpitations and leg swelling.  Gastrointestinal: Negative for abdominal distention.  Genitourinary: Negative for dysuria, urgency, frequency, hematuria, flank pain, decreased urine volume, difficulty urinating and dyspareunia.  Musculoskeletal: Negative for back pain, joint swelling, arthralgias and gait problem.  Neurological: Negative for dizziness, tremors, seizures, syncope, facial asymmetry, speech difficulty, weakness, light-headedness, numbness and headaches.  Hematological: Negative for adenopathy. Does not bruise/bleed easily.  Psychiatric/Behavioral: Negative for hallucinations, behavioral problems, confusion, dysphoric mood, decreased concentration and agitation.    Objective:   Filed Vitals:   04/09/13 1105  BP: 149/94  Pulse: 81  Temp: 98.5 F (36.9 C)  Resp: 16    Physical Exam  Constitutional: Appears well-developed and well-nourished. No distress.  HENT: Normocephalic. External right and left ear normal. Oropharynx is clear and moist.  Eyes: Conjunctivae and EOM are normal. PERRLA, no scleral icterus.  Neck: Normal ROM. Neck supple. No JVD. No tracheal deviation. No thyromegaly.  CVS: RRR, S1/S2 +, no murmurs, no gallops, no carotid bruit.  Pulmonary: Effort and breath sounds normal, no stridor, rhonchi, wheezes, rales.  Abdominal: Soft. BS +,  no distension, tenderness, rebound or guarding.  Musculoskeletal: Normal range of motion. No edema and no tenderness.  Lymphadenopathy: No lymphadenopathy noted, cervical, inguinal. Neuro: Alert. Normal reflexes, muscle tone coordination. No cranial nerve deficit. Skin: Skin is warm and dry. No rash noted. Not diaphoretic. No erythema.  No pallor.  Psychiatric: Normal mood and affect. Behavior, judgment, thought content normal.   Lab Results  Component Value Date   WBC 9.2 01/04/2010   HGB 11.7* 12/21/2010   HCT 34.6* 01/04/2010   MCV 89.6 01/04/2010   PLT 247 01/04/2010   Lab Results  Component Value  Date   CREATININE 0.76 01/04/2010   BUN 13 01/04/2010   NA 144 01/04/2010   K 4.1 01/04/2010   CL 105 01/04/2010   CO2 24 01/04/2010    Lab Results  Component Value Date   HGBA1C 5.5 01/04/2010   Lipid Panel     Component Value Date/Time   CHOL 207* 01/04/2010 2038   TRIG 162* 01/04/2010 2038   HDL 43 01/04/2010 2038   CHOLHDL 4.8 Ratio 01/04/2010 2038   VLDL 32 01/04/2010 2038   LDLCALC 132* 01/04/2010 2038       Assessment and plan:   Patient Active Problem List   Diagnosis Date Noted  . Preventative health care - Check CBC CMP, A1c, lipid panel TSH.  - Patient declined to have mammography and colonoscopy  - Flu shot to be given today  04/09/2013

## 2013-04-09 NOTE — Telephone Encounter (Signed)
Pt was here earlier today and scheduled to see a cardiologist but does not know why, also she she did not receive results for EKG. Please call back to clarify.

## 2013-04-09 NOTE — Patient Instructions (Signed)

## 2013-04-13 ENCOUNTER — Encounter: Payer: Self-pay | Admitting: Internal Medicine

## 2013-04-14 ENCOUNTER — Telehealth: Payer: Self-pay

## 2013-04-14 NOTE — Telephone Encounter (Signed)
Patient not available Left message on voice mail to return our call 

## 2013-04-16 ENCOUNTER — Ambulatory Visit: Payer: Worker's Compensation | Admitting: Cardiology

## 2013-05-08 ENCOUNTER — Ambulatory Visit: Payer: Worker's Compensation | Admitting: Internal Medicine

## 2013-06-04 ENCOUNTER — Ambulatory Visit: Payer: Worker's Compensation | Admitting: Internal Medicine

## 2013-06-04 ENCOUNTER — Ambulatory Visit: Payer: Self-pay

## 2013-07-16 HISTORY — PX: CARDIAC CATHETERIZATION: SHX172

## 2013-08-06 ENCOUNTER — Emergency Department (HOSPITAL_COMMUNITY): Payer: Medicaid Other

## 2013-08-06 ENCOUNTER — Inpatient Hospital Stay (HOSPITAL_COMMUNITY)
Admission: EM | Admit: 2013-08-06 | Discharge: 2013-08-11 | DRG: 280 | Disposition: A | Discharge status: Home / Self Care | Payer: Medicaid Other | Attending: Cardiology | Admitting: Cardiology

## 2013-08-06 ENCOUNTER — Encounter (HOSPITAL_COMMUNITY): Payer: Self-pay | Admitting: Emergency Medicine

## 2013-08-06 DIAGNOSIS — I059 Rheumatic mitral valve disease, unspecified: Secondary | ICD-10-CM | POA: Diagnosis present

## 2013-08-06 DIAGNOSIS — I509 Heart failure, unspecified: Secondary | ICD-10-CM | POA: Diagnosis present

## 2013-08-06 DIAGNOSIS — J9600 Acute respiratory failure, unspecified whether with hypoxia or hypercapnia: Secondary | ICD-10-CM

## 2013-08-06 DIAGNOSIS — Z91018 Allergy to other foods: Secondary | ICD-10-CM

## 2013-08-06 DIAGNOSIS — K219 Gastro-esophageal reflux disease without esophagitis: Secondary | ICD-10-CM | POA: Diagnosis present

## 2013-08-06 DIAGNOSIS — I447 Left bundle-branch block, unspecified: Secondary | ICD-10-CM | POA: Diagnosis present

## 2013-08-06 DIAGNOSIS — Z7982 Long term (current) use of aspirin: Secondary | ICD-10-CM

## 2013-08-06 DIAGNOSIS — I248 Other forms of acute ischemic heart disease: Secondary | ICD-10-CM | POA: Diagnosis present

## 2013-08-06 DIAGNOSIS — I5021 Acute systolic (congestive) heart failure: Secondary | ICD-10-CM | POA: Diagnosis present

## 2013-08-06 DIAGNOSIS — I5041 Acute combined systolic (congestive) and diastolic (congestive) heart failure: Principal | ICD-10-CM | POA: Diagnosis present

## 2013-08-06 DIAGNOSIS — Z79899 Other long term (current) drug therapy: Secondary | ICD-10-CM

## 2013-08-06 DIAGNOSIS — I959 Hypotension, unspecified: Secondary | ICD-10-CM | POA: Diagnosis present

## 2013-08-06 DIAGNOSIS — I501 Left ventricular failure: Secondary | ICD-10-CM

## 2013-08-06 DIAGNOSIS — I214 Non-ST elevation (NSTEMI) myocardial infarction: Secondary | ICD-10-CM | POA: Diagnosis present

## 2013-08-06 DIAGNOSIS — I340 Nonrheumatic mitral (valve) insufficiency: Secondary | ICD-10-CM | POA: Diagnosis present

## 2013-08-06 DIAGNOSIS — I428 Other cardiomyopathies: Secondary | ICD-10-CM | POA: Diagnosis present

## 2013-08-06 DIAGNOSIS — E876 Hypokalemia: Secondary | ICD-10-CM | POA: Diagnosis present

## 2013-08-06 DIAGNOSIS — I2489 Other forms of acute ischemic heart disease: Secondary | ICD-10-CM | POA: Diagnosis present

## 2013-08-06 HISTORY — DX: Hypotension, unspecified: I95.9

## 2013-08-06 HISTORY — DX: Unspecified systolic (congestive) heart failure: I50.20

## 2013-08-06 HISTORY — DX: Nonrheumatic mitral (valve) insufficiency: I34.0

## 2013-08-06 HISTORY — DX: Left bundle-branch block, unspecified: I44.7

## 2013-08-06 LAB — BASIC METABOLIC PANEL
ANION GAP: 16 — AB (ref 5–15)
BUN: 19 mg/dL (ref 6–23)
CALCIUM: 9.5 mg/dL (ref 8.4–10.5)
CHLORIDE: 107 meq/L (ref 96–112)
CO2: 19 mEq/L (ref 19–32)
CREATININE: 0.84 mg/dL (ref 0.50–1.10)
GFR calc non Af Amer: 72 mL/min — ABNORMAL LOW (ref >90)
GFR, EST AFRICAN AMERICAN: 83 mL/min — AB (ref >90)
Glucose, Bld: 140 mg/dL — ABNORMAL HIGH (ref 70–99)
Potassium: 3.5 mEq/L — ABNORMAL LOW (ref 3.7–5.3)
Sodium: 142 mEq/L (ref 137–147)

## 2013-08-06 LAB — CBC WITH DIFFERENTIAL/PLATELET
BASOS ABS: 0 10*3/uL (ref 0.0–0.1)
Basophils Relative: 0 % (ref 0–1)
EOS ABS: 0.1 10*3/uL (ref 0.0–0.7)
EOS PCT: 1 % (ref 0–5)
HEMATOCRIT: 41.3 % (ref 36.0–46.0)
Hemoglobin: 13.4 g/dL (ref 12.0–15.0)
LYMPHS PCT: 11 % — AB (ref 12–46)
Lymphs Abs: 1.6 10*3/uL (ref 0.7–4.0)
MCH: 29.3 pg (ref 26.0–34.0)
MCHC: 32.4 g/dL (ref 30.0–36.0)
MCV: 90.2 fL (ref 78.0–100.0)
MONO ABS: 0.8 10*3/uL (ref 0.1–1.0)
Monocytes Relative: 6 % (ref 3–12)
Neutro Abs: 11.5 10*3/uL — ABNORMAL HIGH (ref 1.7–7.7)
Neutrophils Relative %: 82 % — ABNORMAL HIGH (ref 43–77)
PLATELETS: 249 10*3/uL (ref 150–400)
RBC: 4.58 MIL/uL (ref 3.87–5.11)
RDW: 13.8 % (ref 11.5–15.5)
WBC: 14.1 10*3/uL — ABNORMAL HIGH (ref 4.0–10.5)

## 2013-08-06 LAB — I-STAT TROPONIN, ED: Troponin i, poc: 0.07 ng/mL (ref 0.00–0.08)

## 2013-08-06 LAB — PRO B NATRIURETIC PEPTIDE: PRO B NATRI PEPTIDE: 1317 pg/mL — AB (ref 0–125)

## 2013-08-06 MED ORDER — ALBUTEROL SULFATE (2.5 MG/3ML) 0.083% IN NEBU
INHALATION_SOLUTION | RESPIRATORY_TRACT | Status: AC
  Filled 2013-08-06: qty 6

## 2013-08-06 MED ORDER — IPRATROPIUM BROMIDE 0.02 % IN SOLN
RESPIRATORY_TRACT | Status: AC
  Filled 2013-08-06: qty 2.5

## 2013-08-06 NOTE — ED Notes (Signed)
Breathing easier.  Pt anxiety appears to have lessened.  NRB remains in place.  Family at bedside.

## 2013-08-06 NOTE — ED Provider Notes (Signed)
CSN: [id number]     Arrival date & time 08/06/13  2202 History   First MD Initiated Contact with Patient 08/06/13 2212     Chief Complaint  Patient presents with  . Shortness of Breath     (Consider location/radiation/quality/duration/timing/severity/associated sxs/prior Treatment) HPI 5 caveat patient dyspneic, unstable vital signs. Patient developed shortness of breath onset 4:30 PM today progressively worsening. She admits to mild cough denies chest pain denies other complaint. No treatment prior to coming here. Past Medical History  Diagnosis Date  . GERD (gastroesophageal reflux disease)   . Arthritis   . Complication of anesthesia   . PONV (postoperative nausea and vomiting)   . No pertinent past medical history    Past Surgical History  Procedure Laterality Date  . Tonsillectomy    . Knee arthroscopy      rt  . Wrist arthroscopy  12/21/2010    Procedure: ARTHROSCOPY WRIST;  Surgeon: [name], MD;  Location: [facility];  Service: Orthopedics;  Laterality: Right;  right wrist, repair TFCC on right, open reconstruction ECU sheath   History reviewed. No pertinent family history. History  Substance Use Topics  . Smoking status: Never Smoker   . Smokeless tobacco: Not on file  . Alcohol Use: No   OB History   Grav Para Term Preterm Abortions TAB SAB Ect Mult Living                 Review of Systems  Unable to perform ROS: Acuity of condition  Respiratory: Positive for cough and shortness of breath.       Allergies  Onion and Tomato  Home Medications   Prior to Admission medications   Medication Sig Start Date End Date Taking? Authorizing Provider  famotidine (PEPCID) 20 MG tablet Take 20 mg by mouth 2 (two) times daily as needed.      Historical Provider, MD  levocetirizine (XYZAL) 5 MG tablet Take 5 mg by mouth 1 day or 1 dose.      Historical Provider, MD   BP 89/58  Resp 28  Ht 5\' 5"  (1.651 m)  Wt 170 lb (77.111 kg)  BMI 28.29 kg/m2   SpO2 59% Physical Exam  Nursing note and vitals reviewed. Constitutional: She appears well-developed and well-nourished. She appears distressed.  Unable to speak more than one or 2 words at a time. Moderate to severe respiratory distress  HENT:  Head: Normocephalic and atraumatic.  Eyes: Conjunctivae are normal. Pupils are equal, round, and reactive to light.  Neck: Neck supple. No tracheal deviation present. No thyromegaly present.  Cardiovascular: Normal rate and regular rhythm.   No murmur heard. Pulmonary/Chest:  Diffuse coarse rhonchi  Abdominal: Soft. Bowel sounds are normal. She exhibits no distension. There is no tenderness.  Musculoskeletal: Normal range of motion. She exhibits no edema and no tenderness.  Neurological: She is alert. Coordination normal.  Skin: Skin is warm and dry. No rash noted.  Psychiatric:  Mildly anxious    ED Course  Procedures (including critical care time) Labs Review Labs Reviewed  CBC WITH DIFFERENTIAL  PRO B NATRIURETIC PEPTIDE  BASIC METABOLIC PANEL  I-STAT TROPOININ, ED    Imaging Review No results found.   EKG Interpretation   Date/Time:  Wednesday August 06 2013 22:36:13 EDT Ventricular Rate:  134 PR Interval:  82 QRS Duration: 147 QT Interval:  349 QTC Calculation: 521 R Axis:   63 Text Interpretation:  Sinus tachycardia Left bundle branch block No old  tracing to compare Confirmed by Ethelda Chick  MD, Cornelis Kluver [phone]) on 08/06/2013  10:43:29 PM      Results for orders placed during the hospital encounter of 08/06/13  CBC WITH DIFFERENTIAL      Result Value Ref Range   WBC 14.1 (*) 4.0 - 10.5 K/uL   RBC 4.58  3.87 - 5.11 MIL/uL   Hemoglobin 13.4  12.0 - 15.0 g/dL   HCT 30.1  60.1 - 09.3 %   MCV 90.2  78.0 - 100.0 fL   MCH 29.3  26.0 - 34.0 pg   MCHC 32.4  30.0 - 36.0 g/dL   RDW 23.5  57.3 - 22.0 %   Platelets 249  150 - 400 K/uL   Neutrophils Relative % 82 (*) 43 - 77 %   Neutro Abs 11.5 (*) 1.7 - 7.7 K/uL   Lymphocytes  Relative 11 (*) 12 - 46 %   Lymphs Abs 1.6  0.7 - 4.0 K/uL   Monocytes Relative 6  3 - 12 %   Monocytes Absolute 0.8  0.1 - 1.0 K/uL   Eosinophils Relative 1  0 - 5 %   Eosinophils Absolute 0.1  0.0 - 0.7 K/uL   Basophils Relative 0  0 - 1 %   Basophils Absolute 0.0  0.0 - 0.1 K/uL  PRO B NATRIURETIC PEPTIDE      Result Value Ref Range   Pro B Natriuretic peptide (BNP) 1317.0 (*) 0 - 125 pg/mL  BASIC METABOLIC PANEL      Result Value Ref Range   Sodium 142  137 - 147 mEq/L   Potassium 3.5 (*) 3.7 - 5.3 mEq/L   Chloride 107  96 - 112 mEq/L   CO2 19  19 - 32 mEq/L   Glucose, Bld 140 (*) 70 - 99 mg/dL   BUN 19  6 - 23 mg/dL   Creatinine, Ser 2.54  0.50 - 1.10 mg/dL   Calcium 9.5  8.4 - 27.0 mg/dL   GFR calc non Af Amer 72 (*) >90 mL/min   GFR calc Af Amer 83 (*) >90 mL/min   Anion gap 16 (*) 5 - 15  I-STAT TROPOININ, ED      Result Value Ref Range   Troponin i, poc 0.07  0.00 - 0.08 ng/mL   Comment 3           chest xray viewed by me Dg Chest Portable 1 View  08/06/2013   CLINICAL DATA:  Chest pain, shortness of breath and cough.  EXAM: PORTABLE CHEST - 1 VIEW  COMPARISON:  None.  FINDINGS: The lungs are well-aerated. Diffuse left-sided and right basilar airspace opacification is noted. This may reflect pulmonary edema or diffuse multifocal pneumonia. ARDS cannot be entirely excluded. No pleural effusion or pneumothorax is seen.  The cardiomediastinal silhouette is borderline enlarged. No acute osseous abnormalities are identified.  IMPRESSION: 1. Diffuse left-sided and right basilar airspace opacification noted. This may reflect mildly asymmetric pulmonary edema or diffuse multifocal pneumonia. ARDS cannot be entirely excluded. 2. Borderline cardiomegaly.   Electronically Signed   By: Roanna Raider M.D.   On: 08/06/2013 22:42    11:40 PM patient more comfortable after treatment with oxygen and nebulized albuterol and Atrovent MDM  BNP and chest x-ray are consistent with congestive  heart failure. Concern for pulmonary edema, and cardiogenic shock Cardiology called to evaluate patient in ED. cardiology suggests Lasix 40 mg IV. Will withhold nitrates as patient hypotensive, she will be admitted to step  down unit Final diagnoses:  None  Dx #1 pulmonary edema #2 hypoxia #3hyperglycemia CRITICAL CARE Performed by: Doug SouJACUBOWITZ,Blase Beckner Total critical care time: 40 minute Critical care time was exclusive of separately billable procedures and treating other patients. Critical care was necessary to treat or prevent imminent or life-threatening deterioration. Critical care was time spent personally by me on the following activities: development of treatment plan with patient and/or surrogate as well as nursing, discussions with consultants, evaluation of patient's response to treatment, examination of patient, obtaining history from patient or surrogate, ordering and performing treatments and interventions, ordering and review of laboratory studies, ordering and review of radiographic studies, pulse oximetry and re-evaluation of patient's condition.     Doug SouSam Tarra Pence, MD 08/07/13 [phone]

## 2013-08-06 NOTE — ED Notes (Signed)
C/o shortness of breath and chest pain that began approx 1700 tonight.

## 2013-08-06 NOTE — ED Notes (Signed)
Presents with original O2 sat <60 in triage.  Brought straight back to a room.  Pt weak, skin pink, cool, dry.  Dr Shela Commons to bedside.  Assisted pt to bed.  Audible moist crackles noted while assisting pt.  IV's initiated.  Xray called for portable film, completed.    HHN given per v.o. Dr Shela Commons.  Monitor shows wide complex tachycardia with occ PVC.

## 2013-08-06 NOTE — ED Notes (Signed)
Pt O2 sat 100% now on NRB.  Changed to [place] @5L .  Maintaining O2 sat at 96%.

## 2013-08-07 DIAGNOSIS — I248 Other forms of acute ischemic heart disease: Secondary | ICD-10-CM | POA: Diagnosis present

## 2013-08-07 DIAGNOSIS — I501 Left ventricular failure: Secondary | ICD-10-CM | POA: Diagnosis present

## 2013-08-07 DIAGNOSIS — J811 Chronic pulmonary edema: Secondary | ICD-10-CM

## 2013-08-07 DIAGNOSIS — I428 Other cardiomyopathies: Secondary | ICD-10-CM | POA: Diagnosis present

## 2013-08-07 DIAGNOSIS — I214 Non-ST elevation (NSTEMI) myocardial infarction: Secondary | ICD-10-CM | POA: Diagnosis present

## 2013-08-07 DIAGNOSIS — Z7982 Long term (current) use of aspirin: Secondary | ICD-10-CM | POA: Diagnosis not present

## 2013-08-07 DIAGNOSIS — I059 Rheumatic mitral valve disease, unspecified: Secondary | ICD-10-CM

## 2013-08-07 DIAGNOSIS — E876 Hypokalemia: Secondary | ICD-10-CM | POA: Diagnosis present

## 2013-08-07 DIAGNOSIS — K219 Gastro-esophageal reflux disease without esophagitis: Secondary | ICD-10-CM | POA: Diagnosis present

## 2013-08-07 DIAGNOSIS — I447 Left bundle-branch block, unspecified: Secondary | ICD-10-CM | POA: Diagnosis present

## 2013-08-07 DIAGNOSIS — J9600 Acute respiratory failure, unspecified whether with hypoxia or hypercapnia: Secondary | ICD-10-CM | POA: Diagnosis present

## 2013-08-07 DIAGNOSIS — Z91018 Allergy to other foods: Secondary | ICD-10-CM | POA: Diagnosis not present

## 2013-08-07 DIAGNOSIS — I959 Hypotension, unspecified: Secondary | ICD-10-CM | POA: Diagnosis present

## 2013-08-07 DIAGNOSIS — Z79899 Other long term (current) drug therapy: Secondary | ICD-10-CM | POA: Diagnosis not present

## 2013-08-07 DIAGNOSIS — R0609 Other forms of dyspnea: Secondary | ICD-10-CM

## 2013-08-07 DIAGNOSIS — I509 Heart failure, unspecified: Secondary | ICD-10-CM | POA: Diagnosis present

## 2013-08-07 DIAGNOSIS — R0989 Other specified symptoms and signs involving the circulatory and respiratory systems: Secondary | ICD-10-CM | POA: Diagnosis present

## 2013-08-07 DIAGNOSIS — I5041 Acute combined systolic (congestive) and diastolic (congestive) heart failure: Secondary | ICD-10-CM | POA: Diagnosis present

## 2013-08-07 LAB — TROPONIN I: Troponin I: 0.65 ng/mL (ref <0.30)

## 2013-08-07 LAB — MRSA PCR SCREENING: MRSA by PCR: NEGATIVE

## 2013-08-07 MED ORDER — SODIUM CHLORIDE 0.9 % IV SOLN: 250.0000 mL | INTRAVENOUS | Status: DC | PRN

## 2013-08-07 MED ORDER — ENOXAPARIN SODIUM 40 MG/0.4ML ~~LOC~~ SOLN
40.0000 mg | SUBCUTANEOUS | Status: DC
  Administered 2013-08-07: 40 mg via SUBCUTANEOUS
  Filled 2013-08-07: qty 0.4

## 2013-08-07 MED ORDER — SODIUM CHLORIDE 0.9 % IJ SOLN
3.0000 mL | Freq: Two times a day (BID) | INTRAMUSCULAR | Status: DC
  Administered 2013-08-07 – 2013-08-10 (×7): 3 mL via INTRAVENOUS

## 2013-08-07 MED ORDER — SODIUM CHLORIDE 0.9 % IJ SOLN: 3.0000 mL | INTRAMUSCULAR | Status: DC | PRN

## 2013-08-07 MED ORDER — ENOXAPARIN SODIUM 30 MG/0.3ML ~~LOC~~ SOLN
30.0000 mg | Freq: Once | SUBCUTANEOUS | Status: AC
  Administered 2013-08-07: 30 mg via SUBCUTANEOUS
  Filled 2013-08-07: qty 0.3

## 2013-08-07 MED ORDER — FUROSEMIDE 10 MG/ML IJ SOLN
INTRAMUSCULAR | Status: AC
  Filled 2013-08-07: qty 4

## 2013-08-07 MED ORDER — ACETAMINOPHEN 325 MG PO TABS
650.0000 mg | ORAL_TABLET | ORAL | Status: DC | PRN
Start: 2013-08-07 — End: 2013-08-11

## 2013-08-07 MED ORDER — ASPIRIN 81 MG PO CHEW
81.0000 mg | CHEWABLE_TABLET | ORAL | Status: AC
  Administered 2013-08-08: 81 mg via ORAL
  Filled 2013-08-07: qty 1

## 2013-08-07 MED ORDER — SODIUM CHLORIDE 0.9 % IJ SOLN
3.0000 mL | Freq: Two times a day (BID) | INTRAMUSCULAR | Status: DC
  Administered 2013-08-07: 3 mL via INTRAVENOUS

## 2013-08-07 MED ORDER — ASPIRIN EC 81 MG PO TBEC
81.0000 mg | DELAYED_RELEASE_TABLET | Freq: Every day | ORAL | Status: DC
  Administered 2013-08-07 – 2013-08-11 (×4): 81 mg via ORAL
  Filled 2013-08-07 (×6): qty 1

## 2013-08-07 MED ORDER — ENOXAPARIN SODIUM 80 MG/0.8ML ~~LOC~~ SOLN
75.0000 mg | Freq: Two times a day (BID) | SUBCUTANEOUS | Status: DC
  Administered 2013-08-07: 75 mg via SUBCUTANEOUS
  Filled 2013-08-07 (×3): qty 0.8

## 2013-08-07 MED ORDER — POTASSIUM CHLORIDE 10 MEQ/100ML IV SOLN
10.0000 meq | INTRAVENOUS | Status: AC
  Administered 2013-08-07 (×3): 10 meq via INTRAVENOUS
  Filled 2013-08-07 (×3): qty 100

## 2013-08-07 MED ORDER — FUROSEMIDE 10 MG/ML IJ SOLN
40.0000 mg | Freq: Once | INTRAMUSCULAR | Status: AC
  Administered 2013-08-07: 40 mg via INTRAVENOUS
  Filled 2013-08-07: qty 4

## 2013-08-07 MED ORDER — ENOXAPARIN SODIUM 40 MG/0.4ML ~~LOC~~ SOLN
35.0000 mg | Freq: Once | SUBCUTANEOUS | Status: DC
  Filled 2013-08-07: qty 0.4

## 2013-08-07 MED ORDER — POTASSIUM CHLORIDE 20 MEQ/15ML (10%) PO LIQD
40.0000 meq | Freq: Once | ORAL | Status: AC
  Administered 2013-08-07: 40 meq via ORAL
  Filled 2013-08-07: qty 30

## 2013-08-07 MED ORDER — ENOXAPARIN SODIUM 40 MG/0.4ML ~~LOC~~ SOLN: 35.0000 mg | Freq: Once | SUBCUTANEOUS | Status: DC

## 2013-08-07 MED ORDER — ONDANSETRON HCL 4 MG/2ML IJ SOLN: 4.0000 mg | Freq: Four times a day (QID) | INTRAMUSCULAR | Status: DC | PRN

## 2013-08-07 MED ORDER — FUROSEMIDE 10 MG/ML IJ SOLN
40.0000 mg | Freq: Two times a day (BID) | INTRAMUSCULAR | Status: DC
Start: 2013-08-07 — End: 2013-08-08
  Administered 2013-08-07 (×2): 40 mg via INTRAVENOUS
  Filled 2013-08-07 (×5): qty 4

## 2013-08-07 NOTE — Progress Notes (Addendum)
EF 25% New diagnose systolic heart failure. Discussed with patient.   Right and left heart cath tomorrow. NPO past midnight.  Is now able to lay flat.   HOLD AM LOVENOX.  Donato Schultz, MD

## 2013-08-07 NOTE — Care Management Note (Addendum)
    Page 1 of 1   08/11/2013     3:53:08 PM CARE MANAGEMENT NOTE 08/11/2013  Patient:  [name], [name]   Account Number:  [id number]  Date Initiated:  08/07/2013  Documentation initiated by:  Junius Creamer  Subjective/Objective Assessment:   adm w heart failure     Action/Plan:   lives w husband, pcp dr d Orpah Cobb   Anticipated DC Date:  08/11/2013   Anticipated DC Plan:  HOME W HOME HEALTH SERVICES  In-house referral  Clinical Social Worker      DC Associate Professor  CM consult  Medication Assistance  [facility] Program  [facility]  Follow-up appt scheduled      Choice offered to / List presented to:             Status of service:  Completed, signed off Medicare Important Message given?   (If response is "NO", the following Medicare IM given date fields will be blank) Date Medicare IM given:   Medicare IM given by:   Date Additional Medicare IM given:   Additional Medicare IM given by:    Discharge Disposition:  HOME/SELF CARE  Per UR Regulation:  Reviewed for med. necessity/level of care/duration of stay  If discussed at Long Length of Stay Meetings, dates discussed:    Comments:  08/11/13 Sidney Ace, RN, BSN [phone] Pt for dc home today with spouse.  Requests transportation home; states can ride the bus home.  Referral to CSW for bus pass.  Pt has no insurance, and is eligible for med assistance through [facility] program.  [facility] letter given with explanation of program benefits.  Pt is active pt at [facility]; follow up appt made for 08/19/13 at 2:30pm--appt info put in [facility] on [facility].  7/23 0958 debbie dowell rn,bsn will moniter for dc needs as pt progresses.

## 2013-08-07 NOTE — Progress Notes (Signed)
Echocardiogram 2D Echocardiogram has been performed.  Sirron Francesconi 08/07/2013, 3:02 PM

## 2013-08-07 NOTE — Progress Notes (Addendum)
Subjective:   Has mild cough, no subjective fevers. She states she is quite active walking 2-3 miles a day, they do not have a car she states. She does not recall having any chest pain. She had a few intermittent episodes of shortness of breath off and on over the past few days.  Her mother had heart failure.    Objective:  Vital Signs in the last 24 hours: Temp:  [97.4 F (36.3 C)-98.8 F (37.1 C)] 98.2 F (36.8 C) (07/23 1121) Pulse Rate:  [93-135] 108 (07/23 1123) Resp:  [15-32] 20 (07/23 1123) BP: (82-123)/(26-90) 94/54 mmHg (07/23 1123) SpO2:  [59 %-100 %] 94 % (07/23 1123) FiO2 (%):  [12 %] 12 % (07/22 2248) Weight:  [166 lb 7.2 oz (75.5 kg)-170 lb (77.111 kg)] 166 lb 7.2 oz (75.5 kg) (07/23 0323)  Intake/Output from previous day: 07/22 0701 - 07/23 0700 In: 250 [I.V.:50; IV Piggyback:200] Out: 1640 [Urine:1640]   Physical Exam: General: Well developed, well nourished, in no acute distress. Appears tired Head:  Normocephalic and atraumatic. No significant JVD appreciated Lungs: Clear to auscultation and percussion. Heart: Normal S1 and S2, S3 gallop.  No murmur, rubs or gallops.  Abdomen: soft, non-tender, positive bowel sounds. Mildly protuberant. Extremities: No clubbing or cyanosis. No edema. Neurologic: Alert and oriented x 3.    Lab Results:  Recent Labs  08/06/13 2211  WBC 14.1*  HGB 13.4  PLT 249    Recent Labs  08/06/13 2211  NA 142  K 3.5*  CL 107  CO2 19  GLUCOSE 140*  BUN 19  CREATININE 0.84    Recent Labs  08/07/13 0400  TROPONINI 0.65*   ProBNP 1300  Imaging: Dg Chest Portable 1 View  08/06/2013   CLINICAL DATA:  Chest pain, shortness of breath and cough.  EXAM: PORTABLE CHEST - 1 VIEW  COMPARISON:  None.  FINDINGS: The lungs are well-aerated. Diffuse left-sided and right basilar airspace opacification is noted. This may reflect pulmonary edema or diffuse multifocal pneumonia. ARDS cannot be entirely excluded. No pleural  effusion or pneumothorax is seen.  The cardiomediastinal silhouette is borderline enlarged. No acute osseous abnormalities are identified.  IMPRESSION: 1. Diffuse left-sided and right basilar airspace opacification noted. This may reflect mildly asymmetric pulmonary edema or diffuse multifocal pneumonia. ARDS cannot be entirely excluded. 2. Borderline cardiomegaly.   Electronically Signed   By: Roanna RaiderJeffery  Chang M.D.   On: 08/06/2013 22:42   Personally viewed.   Telemetry: Sinus tachycardia, bundle branch block Personally viewed.   EKG:  Left bundle branch block  Cardiac Studies:  Echocardiogram pending  Assessment/Plan:   63 year old female with presumed acute systolic heart failure with bilateral pulmonary edema pattern on chest x-ray, hypotension, left bundle branch block, mildly elevated troponin.  1. Presumed acute systolic heart failure-awaiting echocardiogram. Gallop appreciated. Orthopnea. Pulmonary edema. Chest x-ray with borderline cardiomegaly. ARDS cannot be excluded. White count is slightly elevated. No fevers. Mild cough noted. If echocardiogram is unrevealing, I will consult pulmonary. Currently she is not on any antibiotics. Continue with Lasix as tolerated. We may need to be think if hypotension continues. Unable to utilize beta blocker or ACE inhibitor because of hypotension.  She may very well require cardiac catheterization especially in light of her elevated troponin. Right and left heart catheterization may prove to be helpful.  2. Demand ischemia-her troponin is mildly elevated at 0.65 which is likely a representation of demand ischemia in the setting of heart failure. Nonetheless, we will  go ahead and increase her Lovenox to ACS dosing  3. Left bundle branch block-chronic  Discussed with family.  Elie Gragert 08/07/2013, 12:24 PM

## 2013-08-07 NOTE — Progress Notes (Addendum)
ANTICOAGULATION CONSULT NOTE - Initial Consult  Pharmacy Consult for lovenox Indication: chest pain/ACS  Allergies  Allergen Reactions  . Onion Other (See Comments)    gas  . Tomato Other (See Comments)    reflux    Patient Measurements: Height: 5\' 5"  (165.1 cm) Weight: 166 lb 7.2 oz (75.5 kg) IBW/kg (Calculated) : 57  Vital Signs: Temp: 98.2 F (36.8 C) (07/23 1121) Temp src: Oral (07/23 1121) BP: 94/54 mmHg (07/23 1123) Pulse Rate: 108 (07/23 1123)  Labs:  Recent Labs  08/06/13 2211 08/07/13 0400  HGB 13.4  --   HCT 41.3  --   PLT 249  --   CREATININE 0.84  --   TROPONINI  --  0.65*    Estimated Creatinine Clearance: 68.8 ml/min (by C-G formula based on Cr of 0.84).   Medical History: Past Medical History  Diagnosis Date  . GERD (gastroesophageal reflux disease)   . Arthritis   . Complication of anesthesia   . PONV (postoperative nausea and vomiting)   . No pertinent past medical history     Medications:  Prescriptions prior to admission  Medication Sig Dispense Refill  . albuterol (PROVENTIL HFA;VENTOLIN HFA) 108 (90 BASE) MCG/ACT inhaler Inhale 2 puffs into the lungs every 6 (six) hours as needed for wheezing or shortness of breath.      . ALPRAZolam (XANAX) 0.5 MG tablet Take 0.5 mg by mouth at bedtime as needed for anxiety.       Scheduled:  . aspirin EC  81 mg Oral Daily  . furosemide  40 mg Intravenous BID  . sodium chloride  3 mL Intravenous Q12H    Assessment: 63 yo female with SOB and elevated troponin to start lovenox for r/o ACS.  SCr= 0.84 and CrCl ~ 70. Patient received 40mg  lovenox at ~ 10am  Goal of Therapy:  Monitor platelets by anticoagulation protocol: Yes   Plan:  -Lovenox 30mg  sq (for total dose of 70mg ) followed by Lovenox 75mg  sq q12h -CBC ever 3 days  Harland German, Pharm D 08/07/2013 12:41 PM

## 2013-08-07 NOTE — H&P (Addendum)
History and Physical  Patient ID: [name] MRN: [id number], SOB: 26-Oct-1950 63 y.o. Date of Encounter: 08/07/2013, 12:51 AM  Primary Physician: [name], MD Primary Cardiologist: none  Chief Complaint: shortness of breath  HPI: 63 y.o. female with no known cardiac history who presented to [facility] on 08/07/2013 with complaints of acute shortness of breath. She and her husband provide history. She is not the best historian and her husband, who is an EMT and is currently in the room wearing a stethoscope, states that she does reveal her symptoms to him and tries to protect him. At first, the history was that this was acute onset but in talking with her, it sounds as though she has had symptoms for at least several days if not weeks. She has noted increased dyspnea over the last couple of days, worse at night when she lays down. Past 2 days it has been significantly worse. A little unclear but at some point in the last several weeks, she took one of her husband's lasix and reported improved breathing. Also took a nitro at some point and it improved her breathing. Tonight, on the day of admission, she was engaged in intercourse with her husband and became extremely short of breath. The EMT husband has a home pulse ox and found her hypoxic. At presentation here at the ER, she was 59% on RA, improved to 95+% on NRB. Pale, clammy and in extremis but has improved with oxygen.   EKG with sinus tachycardia and LBBB. Unclear if the LBBB is new but there is a note in the computer about presenting to her PCP with dizziness, EKG done which prompted a cardiology referral which she never followed up on (waiting on medicare). She had one done pre-op in 2012 but not in the system and not mentioned in the anesthesia H&P. She denies LE edema but her husband endorses dents with socks that have been worsening. Reports weight loss not weight gain.  Cxray with edema.  Labs are significant for BNP of  1300, troponin 0.07.  In the ER, she was given lasix 40 IV.   Past Medical History  Diagnosis Date  . GERD (gastroesophageal reflux disease)   . Arthritis   . Complication of anesthesia   . PONV (postoperative nausea and vomiting)   . No pertinent past medical history      Surgical History:  Past Surgical History  Procedure Laterality Date  . Tonsillectomy    . Knee arthroscopy      rt  . Wrist arthroscopy  12/21/2010    Procedure: ARTHROSCOPY WRIST;  Surgeon: [name], MD;  Location: [facility];  Service: Orthopedics;  Laterality: Right;  right wrist, repair TFCC on right, open reconstruction ECU sheath     Home Meds: Prior to Admission medications   Medication Sig Start Date End Date Taking? Authorizing Provider  albuterol (PROVENTIL HFA;VENTOLIN HFA) 108 (90 BASE) MCG/ACT inhaler Inhale 2 puffs into the lungs every 6 (six) hours as needed for wheezing or shortness of breath.   Yes Historical Provider, MD  ALPRAZolam Prudy Feeler) 0.5 MG tablet Take 0.5 mg by mouth at bedtime as needed for anxiety.   Yes Historical Provider, MD    Allergies:  Allergies  Allergen Reactions  . Onion Other (See Comments)    gas  . Tomato Other (See Comments)    reflux    History   Social History  . Marital Status: Married    Spouse Name: N/A  Number of Children: N/A  . Years of Education: N/A   Occupational History  . Not on file.   Social History Main Topics  . Smoking status: Never Smoker   . Smokeless tobacco: Not on file  . Alcohol Use: No  . Drug Use: No  . Sexual Activity: Not on file   Other Topics Concern  . Not on file   Social History Narrative  . No narrative on file     History reviewed. No pertinent family history.  Review of Systems: General: negative for chills, fever, night sweats or weight changes.  Cardiovascular: see HPI Dermatological: negative for rash Respiratory: negative for cough or wheezing Urologic: negative for  hematuria Abdominal: negative for nausea, vomiting, diarrhea, bright red blood per rectum, melena, or hematemesis Neurologic: negative for visual changes, syncope, or dizziness All other systems reviewed and are otherwise negative except as noted above.  Labs:   Lab Results  Component Value Date   WBC 14.1* 08/06/2013   HGB 13.4 08/06/2013   HCT 41.3 08/06/2013   MCV 90.2 08/06/2013   PLT 249 08/06/2013    Recent Labs Lab 08/06/13 2211  NA 142  K 3.5*  CL 107  CO2 19  BUN 19  CREATININE 0.84  CALCIUM 9.5  GLUCOSE 140*   No results found for this basename: CKTOTAL, CKMB, TROPONINI,  in the last 72 hours Lab Results  Component Value Date   CHOL 192 04/09/2013   HDL 41 04/09/2013   LDLCALC 133* 04/09/2013   TRIG 91 04/09/2013   No results found for this basename: DDIMER    Radiology/Studies:  Dg Chest Portable 1 View  08/06/2013   CLINICAL DATA:  Chest pain, shortness of breath and cough.  EXAM: PORTABLE CHEST - 1 VIEW  COMPARISON:  None.  FINDINGS: The lungs are well-aerated. Diffuse left-sided and right basilar airspace opacification is noted. This may reflect pulmonary edema or diffuse multifocal pneumonia. ARDS cannot be entirely excluded. No pleural effusion or pneumothorax is seen.  The cardiomediastinal silhouette is borderline enlarged. No acute osseous abnormalities are identified.  IMPRESSION: 1. Diffuse left-sided and right basilar airspace opacification noted. This may reflect mildly asymmetric pulmonary edema or diffuse multifocal pneumonia. ARDS cannot be entirely excluded. 2. Borderline cardiomegaly.   Electronically Signed   By: Roanna RaiderJeffery  Chang M.D.   On: 08/06/2013 22:42     EKG: sinus tachycardia, LBBB.  Physical Exam: Blood pressure 101/57, pulse 125, temperature 97.4 F (36.3 C), temperature source Rectal, resp. rate 22, height 5\' 5"  (1.651 m), weight 77.111 kg (170 lb), SpO2 95.00%. General: Well developed, well nourished, mild distress Head: Normocephalic,  atraumatic, sclera non-icteric, nares are without discharge Neck: Supple. Negative for carotid bruits. JVD at 12 cm. Lungs: decreased at bases, mildly labored breathing Heart: tachy, no m/r/g Abdomen: Soft, non-tender, non-distended with normoactive bowel sounds. No rebound/guarding. No obvious abdominal masses. Msk:  Strength and tone appear normal for age. Extremities: +1 edema. Warm and dry. No clubbing or cyanosis. Distal pedal pulses are 2+ and equal bilaterally. Neuro: Alert and oriented X 3. Moves all extremities spontaneously. Psych:  Responds to questions appropriately with a normal affect.    ASSESSMENT AND PLAN:  Problem List 1. Dyspnea and hypoxia: presentation most c\w acute decompensated heart failure, new diagnosis and undifferentiated 2. LBBB 3. GERD 4. Hypokalemia  63 y.o. female with no known cardiac history who presented [facility] on 08/07/2013 with complaints of sub-acute shortness of breath. She is not the best  historian and the picture is complicated by her EMT husband and her's self-treatment and self-diagnosis.   Her picture is most consistent with pulmonary edema (though cxray is a little atypical) and elevated BNP and JVD and what sounds like orthopnea/PND. Workup of de novo heart failure to include echo, and once stabilized, ischemic workup (LBBB suggestive of ischemia cause). Doubt that this is infectious (on xray differential, elevated WBC but is afebrile and history doesn't suggest it) but will keep in the back of mind.  Status is a little tenuous given that she is tachycardic, hypoxic (improved now 95% on 8 L [place]) but she is warm and well perfused. Renal fxn wnl.   Hold on beta blocker due to decompensation. Will consider ACEI if BP holds up after lasix administration.  Replaced K+.  Elevated glucose, check A1c.  Lovenox subq for DVT prophy. PPI   Signed, Adolm Joseph, Tahjae Durr C. MD 08/07/2013, 12:51 AM

## 2013-08-07 NOTE — ED Notes (Signed)
Report to [facility] on [facility].

## 2013-08-07 NOTE — Progress Notes (Signed)
Pt requesting to ambulate - pt stand by assist to the restroom and then walked approx 50 ft in the hallway - no c/o dizziness, weakness or nausea.  Pt returned to chair and bp taken - 87/63 (64), HR 107, 98% on RA.  No c/o pain or discomfort.  Family remains bedside - reminded pt that she needs to utilize her call light when wanting to get up - pt demonstrated the appropriate use of call light and verbalizes understanding.  Will continue to closely monitor.

## 2013-08-07 NOTE — Progress Notes (Signed)
CRITICAL VALUE ALERT  Critical value received:  Troponin 0.65  Date of notification:  08/07/2013  Time of notification:  0527  Critical value read back:Yes.    Nurse who received alert:  Neville Route, RN  MD notified (1st page):  Dr. Adolm Joseph  Time of first page:  0527  MD notified (2nd page):  Time of second page:  Responding MD:  Dr. Adolm Joseph  Time MD responded:  [phone]

## 2013-08-07 NOTE — ED Notes (Signed)
O2 sat remains upper 90's-100%.  O2 turned down to 4L via [place].

## 2013-08-08 ENCOUNTER — Encounter (HOSPITAL_COMMUNITY)
Admission: EM | Disposition: A | Discharge status: Home / Self Care | Payer: Self-pay | Source: Home / Self Care | Attending: Cardiology

## 2013-08-08 DIAGNOSIS — I509 Heart failure, unspecified: Secondary | ICD-10-CM

## 2013-08-08 DIAGNOSIS — I5021 Acute systolic (congestive) heart failure: Secondary | ICD-10-CM | POA: Diagnosis present

## 2013-08-08 HISTORY — PX: LEFT AND RIGHT HEART CATHETERIZATION WITH CORONARY ANGIOGRAM: SHX5449

## 2013-08-08 LAB — BASIC METABOLIC PANEL
ANION GAP: 14 (ref 5–15)
BUN: 17 mg/dL (ref 6–23)
CALCIUM: 9.1 mg/dL (ref 8.4–10.5)
CO2: 23 meq/L (ref 19–32)
CREATININE: 0.79 mg/dL (ref 0.50–1.10)
Chloride: 103 mEq/L (ref 96–112)
GFR calc Af Amer: >90 mL/min (ref >90)
GFR, EST NON AFRICAN AMERICAN: 86 mL/min — AB (ref >90)
Glucose, Bld: 112 mg/dL — ABNORMAL HIGH (ref 70–99)
Potassium: 3.4 mEq/L — ABNORMAL LOW (ref 3.7–5.3)
Sodium: 140 mEq/L (ref 137–147)

## 2013-08-08 LAB — POCT I-STAT 3, VENOUS BLOOD GAS (G3P V)
ACID-BASE EXCESS: 2 mmol/L (ref 0.0–2.0)
BICARBONATE: 26.4 meq/L — AB (ref 20.0–24.0)
O2 Saturation: 68 %
TCO2: 28 mmol/L (ref 0–100)
pCO2, Ven: 38.4 mmHg — ABNORMAL LOW (ref 45.0–50.0)
pH, Ven: 7.445 — ABNORMAL HIGH (ref 7.250–7.300)
pO2, Ven: 34 mmHg (ref 30.0–45.0)

## 2013-08-08 LAB — POCT I-STAT 3, ART BLOOD GAS (G3+)
ACID-BASE EXCESS: 3 mmol/L — AB (ref 0.0–2.0)
Bicarbonate: 26.5 mEq/L — ABNORMAL HIGH (ref 20.0–24.0)
O2 Saturation: 97 %
TCO2: 28 mmol/L (ref 0–100)
pCO2 arterial: 34.6 mmHg — ABNORMAL LOW (ref 35.0–45.0)
pH, Arterial: 7.492 — ABNORMAL HIGH (ref 7.350–7.450)
pO2, Arterial: 85 mmHg (ref 80.0–100.0)

## 2013-08-08 LAB — POCT ACTIVATED CLOTTING TIME: Activated Clotting Time: 202 seconds

## 2013-08-08 LAB — PROTIME-INR
INR: 1.09 (ref 0.00–1.49)
Prothrombin Time: 14.1 seconds (ref 11.6–15.2)

## 2013-08-08 SURGERY — LEFT AND RIGHT HEART CATHETERIZATION WITH CORONARY ANGIOGRAM
Anesthesia: LOCAL

## 2013-08-08 MED ORDER — CARVEDILOL 3.125 MG PO TABS
3.1250 mg | ORAL_TABLET | Freq: Two times a day (BID) | ORAL | Status: DC
  Administered 2013-08-08 – 2013-08-11 (×5): 3.125 mg via ORAL
  Filled 2013-08-08 (×9): qty 1

## 2013-08-08 MED ORDER — MIDAZOLAM HCL 2 MG/2ML IJ SOLN
INTRAMUSCULAR | Status: AC
  Filled 2013-08-08: qty 2

## 2013-08-08 MED ORDER — POTASSIUM CHLORIDE CRYS ER 20 MEQ PO TBCR
40.0000 meq | EXTENDED_RELEASE_TABLET | Freq: Two times a day (BID) | ORAL | Status: DC
  Administered 2013-08-08: 40 meq via ORAL
  Filled 2013-08-08 (×3): qty 2

## 2013-08-08 MED ORDER — NITROGLYCERIN 1 MG/10 ML FOR IR/CATH LAB
INTRA_ARTERIAL | Status: AC
  Filled 2013-08-08: qty 10

## 2013-08-08 MED ORDER — HEPARIN SODIUM (PORCINE) 1000 UNIT/ML IJ SOLN
INTRAMUSCULAR | Status: AC
  Filled 2013-08-08: qty 1

## 2013-08-08 MED ORDER — HEPARIN (PORCINE) IN NACL 2-0.9 UNIT/ML-% IJ SOLN
INTRAMUSCULAR | Status: AC
  Filled 2013-08-08: qty 1000

## 2013-08-08 MED ORDER — VERAPAMIL HCL 2.5 MG/ML IV SOLN
INTRAVENOUS | Status: AC
  Filled 2013-08-08: qty 2

## 2013-08-08 MED ORDER — LIDOCAINE HCL (PF) 1 % IJ SOLN
INTRAMUSCULAR | Status: AC
  Filled 2013-08-08: qty 30

## 2013-08-08 MED ORDER — SODIUM CHLORIDE 0.9 % IV SOLN: INTRAVENOUS | Status: AC

## 2013-08-08 MED ORDER — FUROSEMIDE 40 MG PO TABS
40.0000 mg | ORAL_TABLET | Freq: Two times a day (BID) | ORAL | Status: DC
  Administered 2013-08-08 – 2013-08-09 (×2): 40 mg via ORAL
  Filled 2013-08-08 (×5): qty 1

## 2013-08-08 MED ORDER — SODIUM CHLORIDE 0.9 % IV SOLN
INTRAVENOUS | Status: DC
  Administered 2013-08-08: 06:00:00 via INTRAVENOUS

## 2013-08-08 NOTE — Progress Notes (Signed)
R and L heart cath   - no CAD  - Wedge 11, EDP 18  Echo EF 20%  Non ischemic cardiomyopathy.  TSH normal Likely viral  BP 100 SPB.  Will start coreg 3.125 BID Tomorrow lisinopril 2.5mg  QD Start lasix 40mg  PO BID  BP limiting uptitration.   Discussed with Dr. Sanjuana Kava.   Transferred to floor.   Donato Schultz, MD

## 2013-08-08 NOTE — Progress Notes (Signed)
Pt transferred to cath lab with RN x 1 with assist; oxygen and tele accompany pt; family at bedside waiting in CCU waiting room-MD aware; questions encouraged and answered; will continue to monitor

## 2013-08-08 NOTE — Progress Notes (Signed)
Unable to aspirate sheath in the right brachial vein to check ACT prior to pull.

## 2013-08-08 NOTE — CV Procedure (Signed)
      Cardiac Catheterization Operative Report  [name] [id number] 7/24/201511:52 AM [name], MD  Procedure Performed:  1. Left Heart Catheterization 2. Selective Coronary Angiography 3. Right Heart Catheterization  Operator: Verne Carrow, MD  Acess: Right radial artery, right antecubital vein  Indication:   63 yo female with no history of cardiac disease admitted with CHF. LVEF=25% by echo. Cath to assess filling pressures and exclude CAD.                               Procedure Details: The risks, benefits, complications, treatment options, and expected outcomes were discussed with the patient. The patient and/or family concurred with the proposed plan, giving informed consent. The patient was brought to the cath lab after IV hydration was begun and oral premedication was given. The patient was further sedated with Versed. The right arm was prepped in the antecubital area and the radial area. An antecubital IV was in place in the right antecubital vein. I changed this to a 5 [place] sheath over a wire. I then used a balloon tipped catheter to perform right heart catheterization. I then turned my attention to the right radial artery. 1% lidocaine used for local anesthesia. I then inserted a 5 French sheath in the right radial artery. 3 mg Verapamil given through the sheath. 4000 units IV heparin was given. Standard diagnostic catheters were used to perform selective coronary angiography. A pigtail catheter was used to measure LV pressures. A Terumo hemostasis band was applied at the arteriotomy site. There were no immediate complications. The patient was taken to the recovery area in stable condition.   Hemodynamic Findings: Ao:   106/69             LV: 103/10/0 RA:  4              RV: 24/1/5 PA:  23/8 (mean 12) PCWP: 11 Fick Cardiac Output: 4.61 L/min Fick Cardiac Index:2.52 L/min/m2 Central Aortic Saturation: 97% Pulmonary Artery Saturation:  68%  Angiographic Findings:  Left main: No obstructive disease.   Left Anterior Descending Artery: Large caliber vessel that courses to the apex. There is a moderate caliber diagonal branch. No obstructive disease.   Circumflex Artery: Large caliber vessel with large caliber obtuse marginal branch. No obstructive disease.   Right Coronary Artery: Large dominant vessel with no obstructive disease.   Left Ventricular Angiogram: Deferred  Impression: 1. No angiographic evidence of CAD 2. Normal filling pressures.  3. Non-ischemic cardiomyopathy  Recommendations: Medical management. She appears to be adequately diuresed. Will changed Lasix to 40 mg po BID. Will add low dose Coreg 3.125 mg po BID. Will consider starting Lisinopril 2.5 mg po Qdaily in am if BP tolerates. Transfer to telemetry unit.        Complications:  None; patient tolerated the procedure well.

## 2013-08-08 NOTE — Progress Notes (Signed)
Site area: right brachial vein Site Prior to Removal:  Level 0 Pressure Applied For  10 MINUTES   Manual:   Yes by Eunice Blase RN holding area Patient Status During Pull:  No complications  Post Pull arm Site:  Level 0 Post Pull Instructions Given:  yes Post Pull Pulses Present: radial pulse present Dressing Applied:  tegaderm  Bedrest begins  Comments 12:40 3 ml removed from TR band, 9 ml left in band

## 2013-08-08 NOTE — Progress Notes (Addendum)
Dr. Clifton James talking w/patient. Instructions discussed w/patient

## 2013-08-08 NOTE — Progress Notes (Signed)
Potassium 40 BID ordered Donato Schultz, MD

## 2013-08-08 NOTE — Interval H&P Note (Signed)
History and Physical Interval Note:  08/08/2013 9:59 AM  [name]  has presented today for cardiac cath with the diagnosis of acute systolic heart failure, EF 20%.  The various methods of treatment have been discussed with the patient and family. After consideration of risks, benefits and other options for treatment, the patient has consented to  Procedure(s): LEFT AND RIGHT HEART CATHETERIZATION WITH CORONARY ANGIOGRAM (N/A) as a surgical intervention .  The patient's history has been reviewed, patient examined, no change in status, stable for surgery.  I have reviewed the patient's chart and labs.  Questions were answered to the patient's satisfaction.    Cath Lab Visit (complete for each Cath Lab visit)  Clinical Evaluation Leading to the Procedure:   ACS: Yes.    Non-ACS:    Anginal Classification: CCS III  Anti-ischemic medical therapy: No Therapy  Non-Invasive Test Results: No non-invasive testing performed  Prior CABG: No previous CABG        MCALHANY,CHRISTOPHER

## 2013-08-08 NOTE — H&P (View-Only) (Signed)
  Subjective:   Has mild cough, no subjective fevers. She states she is quite active walking 2-3 miles a day, they do not have a car she states. She does not recall having any chest pain. She had a few intermittent episodes of shortness of breath off and on over the past few days.  Her mother had heart failure.    Objective:  Vital Signs in the last 24 hours: Temp:  [97.4 F (36.3 C)-98.8 F (37.1 C)] 98.2 F (36.8 C) (07/23 1121) Pulse Rate:  [93-135] 108 (07/23 1123) Resp:  [15-32] 20 (07/23 1123) BP: (82-123)/(26-90) 94/54 mmHg (07/23 1123) SpO2:  [59 %-100 %] 94 % (07/23 1123) FiO2 (%):  [12 %] 12 % (07/22 2248) Weight:  [166 lb 7.2 oz (75.5 kg)-170 lb (77.111 kg)] 166 lb 7.2 oz (75.5 kg) (07/23 0323)  Intake/Output from previous day: 07/22 0701 - 07/23 0700 In: 250 [I.V.:50; IV Piggyback:200] Out: 1640 [Urine:1640]   Physical Exam: General: Well developed, well nourished, in no acute distress. Appears tired Head:  Normocephalic and atraumatic. No significant JVD appreciated Lungs: Clear to auscultation and percussion. Heart: Normal S1 and S2, S3 gallop.  No murmur, rubs or gallops.  Abdomen: soft, non-tender, positive bowel sounds. Mildly protuberant. Extremities: No clubbing or cyanosis. No edema. Neurologic: Alert and oriented x 3.    Lab Results:  Recent Labs  08/06/13 2211  WBC 14.1*  HGB 13.4  PLT 249    Recent Labs  08/06/13 2211  NA 142  K 3.5*  CL 107  CO2 19  GLUCOSE 140*  BUN 19  CREATININE 0.84    Recent Labs  08/07/13 0400  TROPONINI 0.65*   ProBNP 1300  Imaging: Dg Chest Portable 1 View  08/06/2013   CLINICAL DATA:  Chest pain, shortness of breath and cough.  EXAM: PORTABLE CHEST - 1 VIEW  COMPARISON:  None.  FINDINGS: The lungs are well-aerated. Diffuse left-sided and right basilar airspace opacification is noted. This may reflect pulmonary edema or diffuse multifocal pneumonia. ARDS cannot be entirely excluded. No pleural  effusion or pneumothorax is seen.  The cardiomediastinal silhouette is borderline enlarged. No acute osseous abnormalities are identified.  IMPRESSION: 1. Diffuse left-sided and right basilar airspace opacification noted. This may reflect mildly asymmetric pulmonary edema or diffuse multifocal pneumonia. ARDS cannot be entirely excluded. 2. Borderline cardiomegaly.   Electronically Signed   By: Jeffery  Chang M.D.   On: 08/06/2013 22:42   Personally viewed.   Telemetry: Sinus tachycardia, bundle branch block Personally viewed.   EKG:  Left bundle branch block  Cardiac Studies:  Echocardiogram pending  Assessment/Plan:   62-year-old female with presumed acute systolic heart failure with bilateral pulmonary edema pattern on chest x-ray, hypotension, left bundle branch block, mildly elevated troponin.  1. Presumed acute systolic heart failure-awaiting echocardiogram. Gallop appreciated. Orthopnea. Pulmonary edema. Chest x-ray with borderline cardiomegaly. ARDS cannot be excluded. White count is slightly elevated. No fevers. Mild cough noted. If echocardiogram is unrevealing, I will consult pulmonary. Currently she is not on any antibiotics. Continue with Lasix as tolerated. We may need to be think if hypotension continues. Unable to utilize beta blocker or ACE inhibitor because of hypotension.  She may very well require cardiac catheterization especially in light of her elevated troponin. Right and left heart catheterization may prove to be helpful.  2. Demand ischemia-her troponin is mildly elevated at 0.65 which is likely a representation of demand ischemia in the setting of heart failure. Nonetheless, we will   go ahead and increase her Lovenox to ACS dosing  3. Left bundle branch block-chronic  Discussed with family.  SKAINS, MARK 08/07/2013, 12:24 PM

## 2013-08-09 DIAGNOSIS — I509 Heart failure, unspecified: Secondary | ICD-10-CM

## 2013-08-09 DIAGNOSIS — I5021 Acute systolic (congestive) heart failure: Secondary | ICD-10-CM

## 2013-08-09 LAB — BASIC METABOLIC PANEL
Anion gap: 13 (ref 5–15)
BUN: 21 mg/dL (ref 6–23)
CALCIUM: 8.8 mg/dL (ref 8.4–10.5)
CO2: 23 mEq/L (ref 19–32)
Chloride: 104 mEq/L (ref 96–112)
Creatinine, Ser: 0.74 mg/dL (ref 0.50–1.10)
GFR calc Af Amer: >90 mL/min (ref >90)
GFR, EST NON AFRICAN AMERICAN: 88 mL/min — AB (ref >90)
Glucose, Bld: 112 mg/dL — ABNORMAL HIGH (ref 70–99)
Potassium: 3.9 mEq/L (ref 3.7–5.3)
Sodium: 140 mEq/L (ref 137–147)

## 2013-08-09 LAB — CBC
HEMATOCRIT: 39.7 % (ref 36.0–46.0)
HEMOGLOBIN: 12.7 g/dL (ref 12.0–15.0)
MCH: 29 pg (ref 26.0–34.0)
MCHC: 32 g/dL (ref 30.0–36.0)
MCV: 90.6 fL (ref 78.0–100.0)
Platelets: 229 10*3/uL (ref 150–400)
RBC: 4.38 MIL/uL (ref 3.87–5.11)
RDW: 14 % (ref 11.5–15.5)
WBC: 9.7 10*3/uL (ref 4.0–10.5)

## 2013-08-09 MED ORDER — RAMIPRIL 1.25 MG PO CAPS
1.2500 mg | ORAL_CAPSULE | Freq: Every day | ORAL | Status: DC
  Administered 2013-08-09: 1.25 mg via ORAL
  Filled 2013-08-09 (×2): qty 1

## 2013-08-09 NOTE — Progress Notes (Signed)
    Primary cardiologist: Dr. Donato Schultz  Subjective:   Feels better. No chest pain. Breathing improved in general.   Objective:   Temp:  [98 F (36.7 C)-98.5 F (36.9 C)] 98.5 F (36.9 C) (07/25 0437) Pulse Rate:  [75-98] 96 (07/25 0857) Resp:  [13-20] 19 (07/25 0437) BP: (90-119)/(59-86) 108/72 mmHg (07/25 0857) SpO2:  [94 %-100 %] 94 % (07/25 0437) Weight:  [164 lb 9.6 oz (74.662 kg)] 164 lb 9.6 oz (74.662 kg) (07/25 0437) Last BM Date: 08/07/13  Filed Weights   08/07/13 0323 08/08/13 0421 08/09/13 0437  Weight: 166 lb 7.2 oz (75.5 kg) 164 lb 3.9 oz (74.5 kg) 164 lb 9.6 oz (74.662 kg)    Intake/Output Summary (Last 24 hours) at 08/09/13 0908 Last data filed at 08/09/13 0500  Gross per 24 hour  Intake 589.17 ml  Output    250 ml  Net 339.17 ml    Telemetry: Sinus rhythm.  Exam:  General: Appears comfortable.  Lungs: No rales. Nonlabored.  Cardiac: RRR, no gallop.  Extremities: Trace edema.   Lab Results:  Basic Metabolic Panel:  Recent Labs Lab 08/06/13 2211 08/08/13 0220 08/09/13 0038  NA 142 140 140  K 3.5* 3.4* 3.9  CL 107 103 104  CO2 19 23 23   GLUCOSE 140* 112* 112*  BUN 19 17 21   CREATININE 0.84 0.79 0.74  CALCIUM 9.5 9.1 8.8    CBC:  Recent Labs Lab 08/06/13 2211 08/09/13 0038  WBC 14.1* 9.7  HGB 13.4 12.7  HCT 41.3 39.7  MCV 90.2 90.6  PLT 249 229    Cardiac Enzymes:  Recent Labs Lab 08/07/13 0400  TROPONINI 0.65*    BNP:  Recent Labs  08/06/13 2211  PROBNP 1317.0*    Coagulation:  Recent Labs Lab 08/08/13 0220  INR 1.09    Echocardiogram 08/07/2013: Study Conclusions  - Left ventricle: Diffuse hypokinesis with abnormal septal motion. The cavity size was severely dilated. Wall thickness was increased in a pattern of mild LVH. The estimated ejection fraction was 25%. Doppler parameters are consistent with both elevated ventricular end-diastolic filling pressure and elevated left atrial filling  pressure. - Aortic valve: There was trivial regurgitation. - Mitral valve: There was mild to moderate regurgitation. - Left atrium: The atrium was mildly dilated.    Medications:   Scheduled Medications: . aspirin EC  81 mg Oral Daily  . carvedilol  3.125 mg Oral BID WC  . furosemide  40 mg Oral BID  . potassium chloride  40 mEq Oral BID  . sodium chloride  3 mL Intravenous Q12H      PRN Medications:  sodium chloride, acetaminophen, ondansetron (ZOFRAN) IV, sodium chloride   Assessment:   1. Acute systolic heart failure with diagnosis of nonischemic cardiomyopathy, LVEF approximately 25%. Cardiac catheterization from 7/24 showed no angiographic evidence of CAD with normal filling pressures. Medical therapy being titrated.  2. Left bundle branch block, old.  3. Type 2 NSTEMI, likely secondary to heart failure.   Plan/Discussion:    Volume status seems improved, normal filling pressures at heart catheterization yesterday. Weight is down 6 pounds. We will try to continue to adjust medical therapy, start low-dose ACE inhibitor, hold diuretics for now given low normal blood pressure. Ambulate as tolerated. Possible discharge in the next 24-48 hours.   Jonelle Sidle, M.D., F.A.C.C.

## 2013-08-10 LAB — BASIC METABOLIC PANEL
Anion gap: 15 (ref 5–15)
BUN: 23 mg/dL (ref 6–23)
CALCIUM: 9.4 mg/dL (ref 8.4–10.5)
CO2: 21 mEq/L (ref 19–32)
Chloride: 102 mEq/L (ref 96–112)
Creatinine, Ser: 0.81 mg/dL (ref 0.50–1.10)
GFR calc Af Amer: 87 mL/min — ABNORMAL LOW (ref >90)
GFR, EST NON AFRICAN AMERICAN: 75 mL/min — AB (ref >90)
GLUCOSE: 110 mg/dL — AB (ref 70–99)
Potassium: 3.8 mEq/L (ref 3.7–5.3)
Sodium: 138 mEq/L (ref 137–147)

## 2013-08-10 LAB — CBC
HCT: 38.1 % (ref 36.0–46.0)
Hemoglobin: 12.1 g/dL (ref 12.0–15.0)
MCH: 28.7 pg (ref 26.0–34.0)
MCHC: 31.8 g/dL (ref 30.0–36.0)
MCV: 90.5 fL (ref 78.0–100.0)
PLATELETS: 238 10*3/uL (ref 150–400)
RBC: 4.21 MIL/uL (ref 3.87–5.11)
RDW: 13.9 % (ref 11.5–15.5)
WBC: 9.2 10*3/uL (ref 4.0–10.5)

## 2013-08-10 MED ORDER — DIGOXIN 0.25 MG/ML IJ SOLN
0.1250 mg | Freq: Once | INTRAMUSCULAR | Status: AC
  Administered 2013-08-10: 0.125 mg via INTRAVENOUS
  Filled 2013-08-10: qty 0.5

## 2013-08-10 NOTE — Progress Notes (Signed)
    Primary cardiologist: Dr. Donato Schultz  Subjective:   No chest pain. Dizzy when ambulating.   Objective:   Temp:  [98.5 F (36.9 C)-98.8 F (37.1 C)] 98.5 F (36.9 C) (07/26 0420) Pulse Rate:  [88-98] 98 (07/26 0800) Resp:  [18-19] 18 (07/26 0420) BP: (88-108)/(52-72) 88/57 mmHg (07/26 0800) SpO2:  [97 %-99 %] 99 % (07/26 0420) Weight:  [166 lb (75.297 kg)] 166 lb (75.297 kg) (07/26 0420) Last BM Date: 08/09/13  Filed Weights   08/08/13 0421 08/09/13 0437 08/10/13 0420  Weight: 164 lb 3.9 oz (74.5 kg) 164 lb 9.6 oz (74.662 kg) 166 lb (75.297 kg)    Intake/Output Summary (Last 24 hours) at 08/10/13 0814 Last data filed at 08/10/13 0421  Gross per 24 hour  Intake    480 ml  Output    950 ml  Net   -470 ml    Telemetry: Sinus rhythm.  Exam:  General: Appears comfortable.  Lungs: No rales. Nonlabored.  Cardiac: RRR, no gallop.  Extremities: Trace edema.   Lab Results:  Basic Metabolic Panel:  Recent Labs Lab 08/08/13 0220 08/09/13 0038 08/10/13 0510  NA 140 140 138  K 3.4* 3.9 3.8  CL 103 104 102  CO2 23 23 21   GLUCOSE 112* 112* 110*  BUN 17 21 23   CREATININE 0.79 0.74 0.81  CALCIUM 9.1 8.8 9.4    CBC:  Recent Labs Lab 08/06/13 2211 08/09/13 0038 08/10/13 0510  WBC 14.1* 9.7 9.2  HGB 13.4 12.7 12.1  HCT 41.3 39.7 38.1  MCV 90.2 90.6 90.5  PLT 249 229 238    Cardiac Enzymes:  Recent Labs Lab 08/07/13 0400  TROPONINI 0.65*    BNP:  Recent Labs  08/06/13 2211  PROBNP 1317.0*    Coagulation:  Recent Labs Lab 08/08/13 0220  INR 1.09    Echocardiogram 08/07/2013: Study Conclusions  - Left ventricle: Diffuse hypokinesis with abnormal septal motion. The cavity size was severely dilated. Wall thickness was increased in a pattern of mild LVH. The estimated ejection fraction was 25%. Doppler parameters are consistent with both elevated ventricular end-diastolic filling pressure and elevated left atrial filling  pressure. - Aortic valve: There was trivial regurgitation. - Mitral valve: There was mild to moderate regurgitation. - Left atrium: The atrium was mildly dilated.    Medications:   Scheduled Medications: . aspirin EC  81 mg Oral Daily  . carvedilol  3.125 mg Oral BID WC  . ramipril  1.25 mg Oral Daily  . sodium chloride  3 mL Intravenous Q12H    PRN Medications: sodium chloride, acetaminophen, ondansetron (ZOFRAN) IV, sodium chloride   Assessment:   1. Acute systolic heart failure with diagnosis of nonischemic cardiomyopathy, LVEF approximately 25%. Cardiac catheterization from 7/24 showed no angiographic evidence of CAD with normal filling pressures. Medical therapy being titrated.  2. Left bundle branch block, old.  3. Type 2 NSTEMI, likely secondary to heart failure.   Plan/Discussion:    Blood pressure is limiting titration of medical therapy. Will hold off on Altace, try to keep her on low-dose carvedilol and add digoxin for now. No standing diuretic  Ambulate today. Possible discharge in the next 24-48 hours. May want to get input from EP regarding Lifevest and potential followup later for consideration of ICD or CRT with cardiomyopathy left bundle-branch block.   Jonelle Sidle, M.D., F.A.C.C.

## 2013-08-10 NOTE — Progress Notes (Signed)
Ambulated with patient for a very short distance out the door into the hallway, and patient had to stop because she felt dizzy and light-headed.  We returned to the room.  Patient's BP 92/65 this evening, and she believes this is the cause.  Felt better once back in bed.  Will continue to monitor.  Arva Chafe

## 2013-08-11 ENCOUNTER — Encounter (HOSPITAL_COMMUNITY): Payer: Self-pay | Admitting: Physician Assistant

## 2013-08-11 DIAGNOSIS — I340 Nonrheumatic mitral (valve) insufficiency: Secondary | ICD-10-CM | POA: Diagnosis present

## 2013-08-11 DIAGNOSIS — J9600 Acute respiratory failure, unspecified whether with hypoxia or hypercapnia: Secondary | ICD-10-CM

## 2013-08-11 DIAGNOSIS — I428 Other cardiomyopathies: Secondary | ICD-10-CM | POA: Diagnosis present

## 2013-08-11 LAB — BASIC METABOLIC PANEL
Anion gap: 12 (ref 5–15)
BUN: 24 mg/dL — ABNORMAL HIGH (ref 6–23)
CALCIUM: 9 mg/dL (ref 8.4–10.5)
CO2: 21 meq/L (ref 19–32)
CREATININE: 0.77 mg/dL (ref 0.50–1.10)
Chloride: 104 mEq/L (ref 96–112)
GFR calc non Af Amer: 87 mL/min — ABNORMAL LOW (ref >90)
Glucose, Bld: 99 mg/dL (ref 70–99)
Potassium: 4.1 mEq/L (ref 3.7–5.3)
Sodium: 137 mEq/L (ref 137–147)

## 2013-08-11 MED ORDER — ASPIRIN 81 MG PO TBEC: 81.0000 mg | DELAYED_RELEASE_TABLET | Freq: Every day | ORAL | Status: DC

## 2013-08-11 MED ORDER — FUROSEMIDE 20 MG PO TABS: ORAL_TABLET | ORAL | Status: DC

## 2013-08-11 MED ORDER — CARVEDILOL 3.125 MG PO TABS
3.1250 mg | ORAL_TABLET | Freq: Two times a day (BID) | ORAL | Status: DC
Start: 2013-08-11 — End: 2013-11-20

## 2013-08-11 NOTE — Discharge Summary (Signed)
Discharge Summary   Patient ID: [name] MRN: [id number], DOB/AGE: [birth year] 63 y.o. Admit date: 08/06/2013 D/C date:     08/11/2013  Primary Care Provider: [name], MD Primary Cardiologist: Anne Fu  Primary Discharge Diagnoses:  1. Acute systolic CHF due to NICM, EF 25%  2. LBBB  3. Elevated troponin felt secondary to demand ischemia from CHF (normal cors)  4. Acute respiratory failure, resolved  5. Hypotension, prohibiting med titration 6. Mild-mod MR by echo 07/2013  Secondary Discharge Diagnoses:  1. GERD 2. Arthritis 3. H/o post-op nausea vomiting  Hospital Course: [name] is a 63 y/o F with history of GERD but no known cardiac history who presented to [facility] with complaints of increased dyspnea for several days, orthopnea, and sockline LEE. At some point in the last several weeks she had taken one of her husband's Lasix and nitroglycerin and it improved her breathing. On day of admission, she was engaged in intercourse with her husband and became extremely short of breath. Her husband has a home pulse ox and found her hypoxic. Per notes he is an EMT. At presentation in the ER she was 59% on RA, improved to 95% on NRB. EKG in the ER showed sinus tach with LBBB, unclear duration. Initial troponin was negative and pro BNP was 1300. CXR was concerning for edema. She was admitted for further evaluation and treatment of CHF with IV diuresis. 2D echo revealed diffuse hypokinesis, mild LVH, EF 25%, both elevated ventricular end-diastolic filling pressure and elevated left atrial filling pressure, mild-mod MR. Troponin bumped to 0.65 which was felt due to her CHF. She underwent cath 08/08/13 showing no angiographic evidence of CAD, normal filling pressures, thus this is a NICM. Etiology is not entirely clear. Recent TSH in 03/2013 was normal. There were no significant ventricular arrhythmias on telemetry thus given lack of data, Lifevest was not placed at discharge in  setting of NICM. Low dose beta blocker was added. Her low blood pressure prohibited further med titration - this why she is not on ACEI/ARB at this time. We can consider adding as outpatient. Digoxin was used x 1 during admission but the decision was made not to continue this on discharge. Due to hypotension, we will not send out on standing Lasix but will give her PRN Lasix rx. She was also advised not to take any meds that are not prescribed to her. She walks quite a bit (for transportation) thus social work will help with arranging discharge transportation. Today she feels well. Discharge weight is 167lb. Dr. Clifton James has seen and examined the patient today and feels she is stable for discharge.   Discharge Vitals: Blood pressure 96/64, pulse 78, temperature 97.9 F (36.6 C), temperature source Oral, resp. rate 16, height 5\' 5"  (1.651 m), weight 167 lb 9.6 oz (76.023 kg), SpO2 96.00%.  Labs: Lab Results  Component Value Date   WBC 9.2 08/10/2013   HGB 12.1 08/10/2013   HCT 38.1 08/10/2013   MCV 90.5 08/10/2013   PLT 238 08/10/2013     Recent Labs Lab 08/11/13 0045  NA 137  K 4.1  CL 104  CO2 21  BUN 24*  CREATININE 0.77  CALCIUM 9.0  GLUCOSE 99    Lab Results  Component Value Date   CHOL 192 04/09/2013   HDL 41 04/09/2013   LDLCALC 133* 04/09/2013   TRIG 91 04/09/2013    Diagnostic Studies/Procedures   Cath 08/08/13 Cardiac Catheterization Operative Report  [name]  [name]  [id number]  7/24/201511:52 AM  Doris CheadleADVANI, DEEPAK, MD  Procedure Performed:  1. Left Heart Catheterization 2. Selective Coronary Angiography 3. Right Heart Catheterization Operator: Verne Carrowhristopher McAlhany, MD  Acess: Right radial artery, right antecubital vein  Indication: 63 yo female with no history of cardiac disease admitted with CHF. LVEF=25% by echo. Cath to assess filling pressures and exclude CAD.  Procedure Details:  The risks, benefits, complications, treatment options, and expected outcomes were  discussed with the patient. The patient and/or family concurred with the proposed plan, giving informed consent. The patient was brought to the cath lab after IV hydration was begun and oral premedication was given. The patient was further sedated with Versed. The right arm was prepped in the antecubital area and the radial area. An antecubital IV was in place in the right antecubital vein. I changed this to a 5 [place] sheath over a wire. I then used a balloon tipped catheter to perform right heart catheterization. I then turned my attention to the right radial artery. 1% lidocaine used for local anesthesia. I then inserted a 5 French sheath in the right radial artery. 3 mg Verapamil given through the sheath. 4000 units IV heparin was given. Standard diagnostic catheters were used to perform selective coronary angiography. A pigtail catheter was used to measure LV pressures. A Terumo hemostasis band was applied at the arteriotomy site. There were no immediate complications. The patient was taken to the recovery area in stable condition.  Hemodynamic Findings:  Ao: 106/69  LV: 103/10/0  RA: 4  RV: 24/1/5  PA: 23/8 (mean 12)  PCWP: 11  Fick Cardiac Output: 4.61 L/min  Fick Cardiac Index:2.52 L/min/m2  Central Aortic Saturation: 97%  Pulmonary Artery Saturation: 68%  Angiographic Findings:  Left main: No obstructive disease.  Left Anterior Descending Artery: Large caliber vessel that courses to the apex. There is a moderate caliber diagonal branch. No obstructive disease.  Circumflex Artery: Large caliber vessel with large caliber obtuse marginal branch. No obstructive disease.  Right Coronary Artery: Large dominant vessel with no obstructive disease.  Left Ventricular Angiogram: Deferred  Impression:  1. No angiographic evidence of CAD  2. Normal filling pressures.  3. Non-ischemic cardiomyopathy  Recommendations: Medical management. She appears to be adequately diuresed. Will changed Lasix to 40  mg po BID. Will add low dose Coreg 3.125 mg po BID. Will consider starting Lisinopril 2.5 mg po Qdaily in am if BP tolerates. Transfer to telemetry unit.  Complications: None; patient tolerated the procedure well.   2D echo 08/07/13 - Left ventricle: Diffuse hypokinesis with abnormal septal motion. The cavity size was severely dilated. Wall thickness was increased in a pattern of mild LVH. The estimated ejection fraction was 25%. Doppler parameters are consistent with both elevated ventricular end-diastolic filling pressure and elevated left atrial filling pressure. - Aortic valve: There was trivial regurgitation. - Mitral valve: There was mild to moderate regurgitation. - Left atrium: The atrium was mildly dilated.  Dg Chest Portable 1 View 08/06/2013   CLINICAL DATA:  Chest pain, shortness of breath and cough.  EXAM: PORTABLE CHEST - 1 VIEW  COMPARISON:  None.  FINDINGS: The lungs are well-aerated. Diffuse left-sided and right basilar airspace opacification is noted. This may reflect pulmonary edema or diffuse multifocal pneumonia. ARDS cannot be entirely excluded. No pleural effusion or pneumothorax is seen.  The cardiomediastinal silhouette is borderline enlarged. No acute osseous abnormalities are identified.  IMPRESSION: 1. Diffuse left-sided and right basilar airspace opacification noted. This may reflect  mildly asymmetric pulmonary edema or diffuse multifocal pneumonia. ARDS cannot be entirely excluded. 2. Borderline cardiomegaly.   Electronically Signed   By: Roanna Raider M.D.   On: 08/06/2013 22:42    Discharge Medications   Current Discharge Medication List    START taking these medications   Details  aspirin EC 81 MG EC tablet Take 1 tablet (81 mg total) by mouth daily.    carvedilol (COREG) 3.125 MG tablet Take 1 tablet (3.125 mg total) by mouth 2 (two) times daily with a meal. Qty: 60 tablet, Refills: 6    furosemide (LASIX) 20 MG tablet Take 1 tablet (20 mg) by mouth once  daily as needed for fluid weight gain (more than 3 lbs from "dry weight"). Qty: 30 tablet, Refills: 3      STOP taking these medications     ALPRAZolam (XANAX) 0.5 MG tablet (not prescribed to patient)         Disposition   The patient will be discharged in stable condition to home. Discharge Instructions   Diet - low sodium heart healthy    Complete by:  As directed      Increase activity slowly    Complete by:  As directed   Do not take any medications that are prescribed to other people as this can be dangerous. Please call the office if you have any medicine questions.   No driving for 2 days. No lifting over 5 lbs for 1 week. No sexual activity for 1 week. Keep procedure sites clean & dry. If you notice increased pain, swelling, bleeding or pus, call/return!  You may shower, but no soaking baths/hot tubs/pools for 1 week.  Your" dry weight" (according to our hospital scale) is 167 pounds. Please weigh yourself when you get home and remember that number, since that will be your home "dry weight." Do not take Lasix if your blood pressure is less than 95 on the top number (systolic blood pressure).          Follow-up Information   Follow up with Ronie Spies, PA-C. (08/18/13 at 3:15pm)    Specialty:  Cardiology   Contact information:   [address] [place] [address] [phone]         Duration of Discharge Encounter: Greater than 30 minutes including physician and PA time.  Signed, Ronie Spies PA-C 08/11/2013, 10:19 AM

## 2013-08-11 NOTE — Progress Notes (Signed)
Patient: Danielle LeveringCheryl J Stimson / Admit Date: 08/06/2013 / Date of Encounter: 08/11/2013, 8:56 AM   Subjective: Feels great. No CP or SOB. Feels ready to go home but has no way to get there but walking.  Objective: Telemetry: NSR - very brief atrial tach yesterday PM Physical Exam: Blood pressure 96/64, pulse 78, temperature 97.9 F (36.6 C), temperature source Oral, resp. rate 16, height 5\' 5"  (1.651 m), weight 167 lb 9.6 oz (76.023 kg), SpO2 96.00%. General: Well developed, well nourished WF, in no acute distress. Head: Normocephalic, atraumatic, sclera non-icteric, no xanthomas, nares are without discharge. Neck: Negative for carotid bruits. JVP not elevated. Lungs: Clear bilaterally to auscultation without wheezes, rales, or rhonchi. Breathing is unlabored. Heart: RRR S1 S2 without murmurs, rubs, or gallops.  Abdomen: Soft, non-tender, non-distended with normoactive bowel sounds. No rebound/guarding. Extremities: No clubbing or cyanosis. No edema. Distal pedal pulses are 2+ and equal bilaterally. R brachial and radial cath sites with mild ecchymosis but no palpable hematoma Neuro: Alert and oriented X 3. Moves all extremities spontaneously. Psych:  Responds to questions appropriately with a normal affect.   Intake/Output Summary (Last 24 hours) at 08/11/13 0856 Last data filed at 08/10/13 1805  Gross per 24 hour  Intake    720 ml  Output    200 ml  Net    520 ml    Inpatient Medications:  . aspirin EC  81 mg Oral Daily  . carvedilol  3.125 mg Oral BID WC  . sodium chloride  3 mL Intravenous Q12H   Infusions:    Labs:  Recent Labs  08/10/13 0510 08/11/13 0045  NA 138 137  K 3.8 4.1  CL 102 104  CO2 21 21  GLUCOSE 110* 99  BUN 23 24*  CREATININE 0.81 0.77  CALCIUM 9.4 9.0   No results found for this basename: AST, ALT, ALKPHOS, BILITOT, PROT, ALBUMIN,  in the last 72 hours  Recent Labs  08/09/13 0038 08/10/13 0510  WBC 9.7 9.2  HGB 12.7 12.1  HCT 39.7 38.1  MCV  90.6 90.5  PLT 229 238    Radiology/Studies:  Dg Chest Portable 1 View  08/06/2013   CLINICAL DATA:  Chest pain, shortness of breath and cough.  EXAM: PORTABLE CHEST - 1 VIEW  COMPARISON:  None.  FINDINGS: The lungs are well-aerated. Diffuse left-sided and right basilar airspace opacification is noted. This may reflect pulmonary edema or diffuse multifocal pneumonia. ARDS cannot be entirely excluded. No pleural effusion or pneumothorax is seen.  The cardiomediastinal silhouette is borderline enlarged. No acute osseous abnormalities are identified.  IMPRESSION: 1. Diffuse left-sided and right basilar airspace opacification noted. This may reflect mildly asymmetric pulmonary edema or diffuse multifocal pneumonia. ARDS cannot be entirely excluded. 2. Borderline cardiomegaly.   Electronically Signed   By: Roanna RaiderJeffery  Chang M.D.   On: 08/06/2013 22:42     Assessment and Plan  1. Acute systolic CHF due to NICM, EF 25% 2. LBBB 3. Elevated troponin felt secondary to CHF (normal cors) 4. Acute respiratory failure, resolved 5. Hypotension, prohibiting med titration  -2.7 L net, -3lbs. Weight starting to creep up but BP remains soft. Continue Coreg. Will discuss whether or not to send her on any home Lasix - she walks everywhere for transportation so wonder if she might dry out too much in this heat. Care management consult to get her set up with med assistance programs and social work consult for getting home.  Signed, Ronie Spiesayna Dunn PA-C  I have personally seen and examined this patient with Ronie Spies, PA-C I agree with the assessment and plan as outlined above. She is much better. Diuresed well. Will discharge home today. Follow up Dr. Anne Fu. Prn Lasix and follow weights closely at home. Continue Coreg. No room for Ace-inh at this time with hypotension.   Tawonda Legaspi 08/11/2013 9:52 AM

## 2013-08-11 NOTE — Discharge Summary (Signed)
See full note this am. cdm 

## 2013-08-18 ENCOUNTER — Ambulatory Visit (INDEPENDENT_AMBULATORY_CARE_PROVIDER_SITE_OTHER): Payer: Self-pay | Admitting: Physician Assistant

## 2013-08-18 ENCOUNTER — Ambulatory Visit: Payer: Self-pay | Admitting: Physician Assistant

## 2013-08-18 ENCOUNTER — Telehealth: Payer: Self-pay | Admitting: Physician Assistant

## 2013-08-18 ENCOUNTER — Encounter: Payer: Self-pay | Admitting: Physician Assistant

## 2013-08-18 VITALS — BP 102/70 | HR 77 | Ht 64.0 in | Wt 169.9 lb

## 2013-08-18 DIAGNOSIS — I059 Rheumatic mitral valve disease, unspecified: Secondary | ICD-10-CM

## 2013-08-18 DIAGNOSIS — I5021 Acute systolic (congestive) heart failure: Secondary | ICD-10-CM

## 2013-08-18 DIAGNOSIS — I447 Left bundle-branch block, unspecified: Secondary | ICD-10-CM

## 2013-08-18 DIAGNOSIS — I340 Nonrheumatic mitral (valve) insufficiency: Secondary | ICD-10-CM

## 2013-08-18 DIAGNOSIS — I509 Heart failure, unspecified: Secondary | ICD-10-CM

## 2013-08-18 DIAGNOSIS — I5022 Chronic systolic (congestive) heart failure: Secondary | ICD-10-CM | POA: Insufficient documentation

## 2013-08-18 MED ORDER — LISINOPRIL 2.5 MG PO TABS: 2.5000 mg | ORAL_TABLET | Freq: Every day | ORAL | Status: DC

## 2013-08-18 NOTE — Progress Notes (Signed)
[address], [address] [place], [place] [address] Phone: [phone]  Date:  08/18/2013   Patient ID:  [name], DOB 15-Mar-1950, MRN [id number]   PCP:  [name], MD  Cardiologist:  Dr. Anne Fu   History of Present Illness: [name] is a 63 y.o. female with recently diagnosed acute systolic CHF (EF 91%) due to NICM (no significant CAD), LBBB, mild-mod MR by recent echo who presents for post-hospital followup. She had initially presented to [facility] 7/22 with SOB and hypoxia and was admitted for CHF and treated with IV diuresis. Troponin bumped to 0.65 felt due to demand ischemia as cath showed no angiographic evidence of CAD. Coreg was added. Her hypotension prohibited further med titration so she was not sent home on ACEI. She was given low dose PRN Lasix to use in case of weight gain.  She has done great since discharge. Her husband has EMT experience and they both have been diligently adhering to recording her daily weights and blood pressures. They are following a very strict low salt diet. She did have 2 lb weight gain upon discharge from the hospital but this resolved with a tablet of Lasix as instructed. She denies SOB, orthopnea, CP, or syncope. BP is mostly in the 100-1teen range as outpatient. She and her husband do not drive. She has been gradually increasing her walking distance and doing fine with this. Weight is up 2 lbs from hospital scale here but at home it has been steadily in the 166 range.  Recent Labs: 04/09/2013: ALT 15; HDL Cholesterol by NMR 41; LDL (calc) 133*; TSH 4.053  08/06/2013: Pro B Natriuretic peptide (BNP) 1317.0*  08/10/2013: Hemoglobin 12.1  08/11/2013: Creatinine 0.77; Potassium 4.1   Wt Readings from Last 3 Encounters:  08/18/13 169 lb 14.4 oz (77.066 kg)  08/11/13 167 lb 9.6 oz (76.023 kg)  08/11/13 167 lb 9.6 oz (76.023 kg)     Past Medical History  Diagnosis Date  . GERD (gastroesophageal reflux disease)   . Arthritis   .  PONV (postoperative nausea and vomiting)   . Systolic CHF     a. Dx 07/2013 - due to NICM. Cath normal cors. EF 25%.  [name] LBBB (left bundle branch block)   . Hypotension   . Mitral regurgitation     a. Mild-mod MR by echo 07/2013.    Current Outpatient Prescriptions  Medication Sig Dispense Refill  . aspirin EC 81 MG EC tablet Take 1 tablet (81 mg total) by mouth daily.      . carvedilol (COREG) 3.125 MG tablet Take 1 tablet (3.125 mg total) by mouth 2 (two) times daily with a meal.  60 tablet  6  . furosemide (LASIX) 20 MG tablet Take 1 tablet (20 mg) by mouth once daily as needed for fluid weight gain (more than 3 lbs from "dry weight").  30 tablet  3   No current facility-administered medications for this visit.    Allergies:   Onion and Tomato   Social History:  The patient  reports that she has never smoked. She does not have any smokeless tobacco history on file. She reports that she does not drink alcohol or use illicit drugs.   Family History:  The patient's family history includes Congestive Heart Failure in her mother.   ROS:  Please see the history of present illness.    All other systems reviewed and negative.   PHYSICAL EXAM:  VS:  BP 102/70  Pulse 77  Ht 5\' 4"  (1.626 m)  Wt 169 lb 14.4 oz (77.066 kg)  BMI 29.15 kg/m2 Well nourished, well developed, in no acute distress HEENT: normal Neck: no JVD Cardiac:  normal S1, S2; RRR; no murmur Lungs:  clear to auscultation bilaterally, no wheezing, rhonchi or rales Abd: soft, nontender, no hepatomegaly Ext: no edema, R radial and brachial sites free of ecchymosis, hematoma; good pulse Skin: warm and dry Neuro:  moves all extremities spontaneously, no focal abnormalities noted  EKG:  NSR possible LAE, LBBB 77bpm, no sig change from prior  ASSESSMENT AND PLAN:  1. Recent acute systolic CHF EF 25% due to NICM 2. Hypotension, improved 3. LBBB 4. Mild-mod MR by echo 07/2013  Patient is doing well. She seems really  dedicated to the information she received in the hospital regarding CHF. She even brought in her GREEN-RED-YELLOW zone card today with all of her weights logged on it. I encouraged her to keep up this trend. Given that BP's have improved, I would like to try her on lisinopril 2.5mg  daily. If her BP runs low or she feels poorly, she will call us. Will recheck BMET in 1 week. Plan to have her followup in 4 weeks with Dr. Anne FuSkains. At that time we will need to determine timing of reassessment of EF to determine if an ICD is something we need to think about. Since this is NICM, the observation period may be up to 9 months. I am hopeful we will see an improvement in her EF if she is able to tolerate gradual advancements of medical therapy.  Signed, Ronie Spiesayna Ty Osh[place], PA-C  08/18/2013 5:31 PM

## 2013-08-18 NOTE — Patient Instructions (Signed)
Start Lisinopril 2.5 mg daily    Lab work  bmet in 1 week  08/25/13 Monday    Your physician recommends that you schedule a follow-up appointment with Dr.Skains in 4 weeks

## 2013-08-18 NOTE — Telephone Encounter (Signed)
[name] is calling because she forgot to ask if she can take bath in the tub .[name] Please call .[name] Thanks

## 2013-08-18 NOTE — Telephone Encounter (Signed)
Spoke with [place], PA. Patient can take bath in tub today. Patient was notified.

## 2013-08-19 ENCOUNTER — Inpatient Hospital Stay: Payer: Self-pay | Admitting: Internal Medicine

## 2013-08-26 LAB — BASIC METABOLIC PANEL
BUN: 19 mg/dL (ref 6–23)
CALCIUM: 9.6 mg/dL (ref 8.4–10.5)
CO2: 23 meq/L (ref 19–32)
CREATININE: 0.82 mg/dL (ref 0.50–1.10)
Chloride: 103 mEq/L (ref 96–112)
Glucose, Bld: 93 mg/dL (ref 70–99)
Potassium: 4.5 mEq/L (ref 3.5–5.3)
Sodium: 138 mEq/L (ref 135–145)

## 2013-09-16 ENCOUNTER — Telehealth: Payer: Self-pay | Admitting: Cardiology

## 2013-09-16 NOTE — Telephone Encounter (Signed)
Please see documentation in the prior telephone note.

## 2013-09-16 NOTE — Telephone Encounter (Signed)
New problem   Pt is having problems with her bp going down and want to know if she need to take her current medication. Please call pt.

## 2013-09-16 NOTE — Telephone Encounter (Signed)
Pt blood pressure is 96/70 and after walking it is 105/80. Should she take her Cardivelol.?

## 2013-09-16 NOTE — Telephone Encounter (Signed)
Pt is not having any s/s.  She will continue to monitor HR and BP.  She will take carvedilol as rxed.

## 2013-09-23 ENCOUNTER — Inpatient Hospital Stay (HOSPITAL_COMMUNITY): Payer: Medicaid Other

## 2013-09-23 ENCOUNTER — Encounter (HOSPITAL_COMMUNITY): Payer: Self-pay | Admitting: Emergency Medicine

## 2013-09-23 ENCOUNTER — Telehealth: Payer: Self-pay | Admitting: *Deleted

## 2013-09-23 ENCOUNTER — Emergency Department (HOSPITAL_COMMUNITY): Payer: Medicaid Other

## 2013-09-23 ENCOUNTER — Inpatient Hospital Stay (HOSPITAL_COMMUNITY)
Admission: EM | Admit: 2013-09-23 | Discharge: 2013-09-26 | DRG: 065 | Disposition: A | Discharge status: Home / Self Care | Payer: Medicaid Other | Attending: Family Medicine | Admitting: Family Medicine

## 2013-09-23 DIAGNOSIS — Z8673 Personal history of transient ischemic attack (TIA), and cerebral infarction without residual deficits: Secondary | ICD-10-CM | POA: Diagnosis not present

## 2013-09-23 DIAGNOSIS — Z7902 Long term (current) use of antithrombotics/antiplatelets: Secondary | ICD-10-CM

## 2013-09-23 DIAGNOSIS — R29810 Facial weakness: Secondary | ICD-10-CM | POA: Diagnosis present

## 2013-09-23 DIAGNOSIS — I509 Heart failure, unspecified: Secondary | ICD-10-CM | POA: Diagnosis present

## 2013-09-23 DIAGNOSIS — I059 Rheumatic mitral valve disease, unspecified: Secondary | ICD-10-CM | POA: Diagnosis present

## 2013-09-23 DIAGNOSIS — Z8249 Family history of ischemic heart disease and other diseases of the circulatory system: Secondary | ICD-10-CM | POA: Diagnosis not present

## 2013-09-23 DIAGNOSIS — I639 Cerebral infarction, unspecified: Secondary | ICD-10-CM | POA: Diagnosis present

## 2013-09-23 DIAGNOSIS — K219 Gastro-esophageal reflux disease without esophagitis: Secondary | ICD-10-CM | POA: Diagnosis present

## 2013-09-23 DIAGNOSIS — I5021 Acute systolic (congestive) heart failure: Secondary | ICD-10-CM | POA: Diagnosis not present

## 2013-09-23 DIAGNOSIS — R4701 Aphasia: Secondary | ICD-10-CM | POA: Diagnosis not present

## 2013-09-23 DIAGNOSIS — R471 Dysarthria and anarthria: Secondary | ICD-10-CM | POA: Diagnosis present

## 2013-09-23 DIAGNOSIS — I959 Hypotension, unspecified: Secondary | ICD-10-CM | POA: Diagnosis not present

## 2013-09-23 DIAGNOSIS — I5022 Chronic systolic (congestive) heart failure: Secondary | ICD-10-CM | POA: Diagnosis present

## 2013-09-23 DIAGNOSIS — Z23 Encounter for immunization: Secondary | ICD-10-CM

## 2013-09-23 DIAGNOSIS — I6340 Cerebral infarction due to embolism of unspecified cerebral artery: Secondary | ICD-10-CM | POA: Diagnosis present

## 2013-09-23 DIAGNOSIS — I9589 Other hypotension: Secondary | ICD-10-CM | POA: Diagnosis present

## 2013-09-23 DIAGNOSIS — I340 Nonrheumatic mitral (valve) insufficiency: Secondary | ICD-10-CM

## 2013-09-23 DIAGNOSIS — Z7982 Long term (current) use of aspirin: Secondary | ICD-10-CM

## 2013-09-23 DIAGNOSIS — Z91018 Allergy to other foods: Secondary | ICD-10-CM | POA: Diagnosis not present

## 2013-09-23 DIAGNOSIS — I447 Left bundle-branch block, unspecified: Secondary | ICD-10-CM | POA: Diagnosis present

## 2013-09-23 DIAGNOSIS — M129 Arthropathy, unspecified: Secondary | ICD-10-CM | POA: Diagnosis present

## 2013-09-23 DIAGNOSIS — I428 Other cardiomyopathies: Secondary | ICD-10-CM

## 2013-09-23 HISTORY — DX: Cerebral infarction, unspecified: I63.9

## 2013-09-23 LAB — COMPREHENSIVE METABOLIC PANEL
ALK PHOS: 93 U/L (ref 39–117)
ALT: 15 U/L (ref 0–35)
AST: 31 U/L (ref 0–37)
Albumin: 3.4 g/dL — ABNORMAL LOW (ref 3.5–5.2)
Anion gap: 12 (ref 5–15)
BUN: 15 mg/dL (ref 6–23)
CO2: 21 meq/L (ref 19–32)
Calcium: 9.3 mg/dL (ref 8.4–10.5)
Chloride: 106 mEq/L (ref 96–112)
Creatinine, Ser: 0.68 mg/dL (ref 0.50–1.10)
GFR calc non Af Amer: >90 mL/min (ref >90)
GLUCOSE: 73 mg/dL (ref 70–99)
Potassium: 5.1 mEq/L (ref 3.7–5.3)
SODIUM: 139 meq/L (ref 137–147)
TOTAL PROTEIN: 7.1 g/dL (ref 6.0–8.3)
Total Bilirubin: 0.5 mg/dL (ref 0.3–1.2)

## 2013-09-23 LAB — I-STAT TROPONIN, ED: Troponin i, poc: 0 ng/mL (ref 0.00–0.08)

## 2013-09-23 LAB — DIFFERENTIAL
Basophils Absolute: 0 10*3/uL (ref 0.0–0.1)
Basophils Relative: 0 % (ref 0–1)
EOS ABS: 0.2 10*3/uL (ref 0.0–0.7)
Eosinophils Relative: 3 % (ref 0–5)
LYMPHS ABS: 2.3 10*3/uL (ref 0.7–4.0)
LYMPHS PCT: 32 % (ref 12–46)
Monocytes Absolute: 0.5 10*3/uL (ref 0.1–1.0)
Monocytes Relative: 7 % (ref 3–12)
NEUTROS PCT: 58 % (ref 43–77)
Neutro Abs: 4.2 10*3/uL (ref 1.7–7.7)

## 2013-09-23 LAB — CBC
HCT: 36.6 % (ref 36.0–46.0)
Hemoglobin: 12.3 g/dL (ref 12.0–15.0)
MCH: 29.3 pg (ref 26.0–34.0)
MCHC: 33.6 g/dL (ref 30.0–36.0)
MCV: 87.1 fL (ref 78.0–100.0)
PLATELETS: 221 10*3/uL (ref 150–400)
RBC: 4.2 MIL/uL (ref 3.87–5.11)
RDW: 13.5 % (ref 11.5–15.5)
WBC: 7.2 10*3/uL (ref 4.0–10.5)

## 2013-09-23 LAB — PROTIME-INR
INR: 1.02 (ref 0.00–1.49)
PROTHROMBIN TIME: 13.4 s (ref 11.6–15.2)

## 2013-09-23 LAB — HEMOGLOBIN A1C
Hgb A1c MFr Bld: 5.6 % (ref <5.7)
Mean Plasma Glucose: 114 mg/dL (ref <117)

## 2013-09-23 LAB — APTT: aPTT: 32 seconds (ref 24–37)

## 2013-09-23 LAB — LIPID PANEL
Cholesterol: 166 mg/dL (ref 0–200)
HDL: 41 mg/dL (ref >39)
LDL Cholesterol: 108 mg/dL — ABNORMAL HIGH (ref 0–99)
Total CHOL/HDL Ratio: 4 RATIO
Triglycerides: 83 mg/dL (ref <150)
VLDL: 17 mg/dL (ref 0–40)

## 2013-09-23 MED ORDER — ASPIRIN 325 MG PO TABS: 325.0000 mg | ORAL_TABLET | Freq: Every day | ORAL | Status: DC

## 2013-09-23 MED ORDER — SODIUM CHLORIDE 0.9 % IV SOLN: 250.0000 mL | INTRAVENOUS | Status: DC | PRN

## 2013-09-23 MED ORDER — ACETAMINOPHEN 325 MG PO TABS: 650.0000 mg | ORAL_TABLET | ORAL | Status: DC | PRN

## 2013-09-23 MED ORDER — STROKE: EARLY STAGES OF RECOVERY BOOK
Freq: Once | Status: DC
  Filled 2013-09-23: qty 1

## 2013-09-23 MED ORDER — ASPIRIN 300 MG RE SUPP: 300.0000 mg | Freq: Every day | RECTAL | Status: DC

## 2013-09-23 MED ORDER — SENNOSIDES-DOCUSATE SODIUM 8.6-50 MG PO TABS: 1.0000 | ORAL_TABLET | Freq: Every evening | ORAL | Status: DC | PRN

## 2013-09-23 MED ORDER — ENOXAPARIN SODIUM 40 MG/0.4ML ~~LOC~~ SOLN
40.0000 mg | SUBCUTANEOUS | Status: DC
  Administered 2013-09-23 – 2013-09-25 (×3): 40 mg via SUBCUTANEOUS
  Filled 2013-09-23 (×3): qty 0.4

## 2013-09-23 MED ORDER — ACETAMINOPHEN 650 MG RE SUPP: 650.0000 mg | RECTAL | Status: DC | PRN

## 2013-09-23 MED ORDER — SODIUM CHLORIDE 0.9 % IJ SOLN
3.0000 mL | Freq: Two times a day (BID) | INTRAMUSCULAR | Status: DC
  Administered 2013-09-23 – 2013-09-25 (×4): 3 mL via INTRAVENOUS

## 2013-09-23 MED ORDER — CARVEDILOL 3.125 MG PO TABS
3.1250 mg | ORAL_TABLET | Freq: Two times a day (BID) | ORAL | Status: DC
  Administered 2013-09-24 – 2013-09-25 (×4): 3.125 mg via ORAL
  Filled 2013-09-23 (×4): qty 1

## 2013-09-23 MED ORDER — SODIUM CHLORIDE 0.9 % IJ SOLN: 3.0000 mL | INTRAMUSCULAR | Status: DC | PRN

## 2013-09-23 NOTE — Telephone Encounter (Signed)
Received a call from patients husband stating that patient has been feeling weak, denies more weakness on one side than the other. Blood pressure 105/70, heart rate 72, and O2 sat 98%. Denies any facial drooping or headache. Did seem to be having a difficult time with crochet yesterday per husband. Recommended patient call PCP or go to ED. Husband states no PCP currently and he will take her to ED.

## 2013-09-23 NOTE — ED Notes (Signed)
Pt undressed, in gown, on monitor, continuous pulse oximetry and blood pressure cuff; family at bedside 

## 2013-09-23 NOTE — H&P (Signed)
[facility] Admission History and Physical Service Pager: [phone]  Patient name: [name] Medical record number: [id number] Date of birth: [birth year] Age: 63 y.o. Gender: female  Primary Care Provider: Lorayne Marek, MD Consultants: Neurology  Code Status: Full   Chief Complaint: Expressive aphasia and dysarthria   Assessment and Plan: [name] is a 63 y.o. female with a PMH significant for systolic CHF (EF 25%) and LBBB who presents with forehead-sparing right-sided facial droop, dysarthria, and expressive aphasia concerning for TIA vs. CVA.   #Possible TIA vs CVA: Patient presented with right-sided facial droop, dysarthria, and expressive aphasia concerning for TIA vs. CVA. Facial droop spares forehead, thus CNS etiology over Bell's palsy.  CT head showed no acute intracranial abnormality. Did not present in time to consider TPA. Possible embolic source as patient has mitral regurgitation that could predispose to left atrial enlargement and fibrillation. Regular rate and rhythm on exam and EKG, though could have paroxysmal afib. Possibly secondary to ischemia, particularly as a patient has a history of hypotension which increases risk of watershed infarcts. Failed outpatient therapy with ASA 81 mg.  -- admit to telemetry, Dr. Mingo Amber attending  --[place] neurology, appreciate recommendations --allow permissive HTN in the setting of acute CVA -- ASA 325 mg, consider addition of plavix.  -- EKG with LBBB, no evidence of ischemia; LBBB on previous EKGs  -- Neuro checks q4h -- Telemetry monitoring  -- F/u MRI/MRA -- F/u carotid dopplers -- Lipid panel, A1c -- PT/OT/speech consults   #Systolic CHF 2/2 to NICM: Clinically euvolemic on admission, at dry weight of 159lbs. Most recent echo (07/2013) showed EF 25%, diffuse hypokinesis, severely dilated cavity size, elevated ventricular end-diastolic filling and left artrial filling pressure, and  mild-moderate mitral regurgitation. Subsequent catheterization did not show evidence of CAD, thus systolic CHF felt to be secondary to NICM. Unclear etiology.  -- Continue  coreg 3.125 BID -- holding home lasix  -- Has Rx for low-dose lisinopril but does not take it due to fear of becoming hypotensive; consider starting for mortality benefit   #Hypotension, Hx of HTN: Appears to run in the 100s-110s/50-60s. Had intermittent hypotension during last hospitalization and therefore was started on prn Lasix (rather than daily).  Was not started on ACE/ARB during last hospitalization due to hypotension. Was started on low-dose ACE at last outpatient cardiology visit but patient does not take due to fear of becoming hypotensive.  -- Continue to monitor -- Per above, consider starting low-dose ACE for mortality benefit  --if continue with hypotension, consider gentle bolus 250 mL   FEN/GI: Regular diet, NS 50 mL/hr Prophylaxis: Lovenox   Disposition: admitted to family medicine teaching service for CVA work up.    History of Present Illness: [name] is a 63 y.o. female with a PMH significant for systolic CHF (EF 49%), LBBB, and mild-moderate mitral regurgitation who presents with forehead-sparing right-sided facial droop, dysarthria, and expressive aphasia concerning for TIA vs. CVA. Around 2pm yesterday, she and her family noticed slurred speech, word finding, left upper extremity weakness, and  "veering off to the left" while walking. After a nap, she woke up without deficits for the rest of the evening. This morning around 7:30am she woke up feeling extremely weak and with slurred speech, word finding, veering to the left while walking, and left upper extremity weakness. Per family, she has improved since this morning but continues to have some dysarthria and expressive aphasia. She no longer endorses left upper  extremity weakness or difficulty walking. Her headache is still present, about a 5/10,  and is diffuse. She does not normally get headaches. She denies numbness or tingling. She denies difficulty comprehending others.She endorses cloudy vision but has a history of cataracts. No cough, SOB, chest pain, palpitations, diarrhea, consitpation, melana, hemotchezia, dysuria.   After her hospitalization in 07/2013, in which she was newly diagnosed with systolic CHF (EF 51%) secondary to NICM, she has followed up with cardiology. She was started on a low-dose of lisinopril but does not take it as fears becoming hypotensive. She takes ASA and Coreg regularly. She takes lasix 23m prn when her weight is >3lbs above her dry weight of 159lbs.   Review Of Systems: Per HPI with the following additions: See HPI  Otherwise 12 point review of systems was performed and was unremarkable.  Patient Active Problem List   Diagnosis Date Noted  . CVA (cerebral infarction) 09/23/2013  . Chronic systolic CHF (congestive heart failure) 08/18/2013  . Non-ischemic cardiomyopathy 08/11/2013  . Acute respiratory failure 08/11/2013  . Mitral regurgitation   . Acute systolic CHF (congestive heart failure), NYHA class 4 08/08/2013  . Pulmonary edema cardiac cause 08/07/2013  . Left bundle branch block 08/07/2013  . Demand ischemia 08/07/2013  . Preventative health care 04/09/2013   Past Medical History: Past Medical History  Diagnosis Date  . GERD (gastroesophageal reflux disease)   . Arthritis   . PONV (postoperative nausea and vomiting)   . Systolic CHF     a. Dx 07/2013 - due to NICM. Cath normal cors. EF 25%.  .Marland KitchenLBBB (left bundle branch block)   . Hypotension   . Mitral regurgitation     a. Mild-mod MR by echo 07/2013.   Past Surgical History: Past Surgical History  Procedure Laterality Date  . Tonsillectomy    . Knee arthroscopy      rt  . Wrist arthroscopy  12/21/2010    Procedure: ARTHROSCOPY WRIST;  Surgeon: GWynonia Sours MD;  Location: [facility]  Service: Orthopedics;   Laterality: Right;  right wrist, repair TFCC on right, open reconstruction ECU sheath   Social History: History  Substance Use Topics  . Smoking status: Never Smoker   . Smokeless tobacco: Not on file  . Alcohol Use: No   Additional social history: Retired, worked as a cScientist, water quality  Please also refer to relevant sections of EMR.  Family History: Family History  Problem Relation Age of Onset  . Congestive Heart Failure Mother   Father - multiple myeloma.   Allergies and Medications: Allergies  Allergen Reactions  . Onion Other (See Comments)    gas  . Tomato Other (See Comments)    reflux   No current facility-administered medications on file prior to encounter.   Current Outpatient Prescriptions on File Prior to Encounter  Medication Sig Dispense Refill  . aspirin EC 81 MG EC tablet Take 1 tablet (81 mg total) by mouth daily.      . carvedilol (COREG) 3.125 MG tablet Take 1 tablet (3.125 mg total) by mouth 2 (two) times daily with a meal.  60 tablet  6  . furosemide (LASIX) 20 MG tablet Take 1 tablet (20 mg) by mouth once daily as needed for fluid weight gain (more than 3 lbs from "dry weight").  30 tablet  3    Objective: BP 106/42  Pulse 69  Temp(Src) 98.2 F (36.8 C) (Oral)  Resp 12  SpO2 100%  Exam: General: Well-appearing  woman in no acute distress. Dysarthria and mild expressive aphasia present.  HEENT: PERRLA. EOMI. MMM. NT/[place]. Cardiovascular: RRR no m/r/g. No carotid bruits. No JVD. Respiratory: CTAB. No wheezes or crackles. Normal WOB. Abdomen: +bs, non-distended. Mildly tender in the LLQ.  Extremities: +1 pitting edema of bilateral lower extremities up to ~10cm below patella  Skin: No lesions present.  Neuro: CN II-XII intact. Right facial droop without forehead involvement. Slow FNF. Normal RAM and heel-to-shin. Abnormal Romberg (had to sit down after ~10 seconds). Slow gait without veering. 5/5 strength throughout with exception of right hand grip of 3/5.  Normal sensation to light touch throughout.   Labs and Imaging: CBC BMET   Recent Labs Lab 09/23/13 1029  WBC 7.2  HGB 12.3  HCT 36.6  PLT 221    Recent Labs Lab 09/23/13 1029  NA 139  K 5.1  CL 106  CO2 21  BUN 15  CREATININE 0.68  GLUCOSE 73  CALCIUM 9.3     CT head 9/8: IMPRESSION:  No acute intracranial abnormality.  EKG: LBBB (present on prior EKGs)  Verlin Dike, Med Student 09/23/2013, 4:05 PM Acting Intern, [place] Intern pager: [phone], text pages welcome  Upper Level Addendum:  I have seen and evaluated this patient along with MS Ms. Foye Deer and reviewed the above note, making necessary revisions in [facility].   Clearance Coots, MD Family Medicine PGY-2

## 2013-09-23 NOTE — ED Notes (Signed)
Pt in via EMS c/o dysphasia and headache since yesterday, today c/o dizziness and generalized fatigue. Pt was unable to type on the computer or complete normal tasks for her like crocheting. Pt alert and oriented at this time, continues to c/o headache. CBG 123 for EMS, IV started in right hand 20g.

## 2013-09-23 NOTE — ED Notes (Signed)
Admission team at bedside.

## 2013-09-24 MED ORDER — CLOPIDOGREL BISULFATE 75 MG PO TABS
75.0000 mg | ORAL_TABLET | Freq: Every day | ORAL | Status: DC
  Administered 2013-09-24 – 2013-09-25 (×2): 75 mg via ORAL
  Filled 2013-09-24 (×2): qty 1

## 2013-09-24 MED ORDER — PNEUMOCOCCAL VAC POLYVALENT 25 MCG/0.5ML IJ INJ
0.5000 mL | INJECTION | INTRAMUSCULAR | Status: AC
  Administered 2013-09-25: 0.5 mL via INTRAMUSCULAR
  Filled 2013-09-24: qty 0.5

## 2013-09-24 MED ORDER — SODIUM CHLORIDE 0.9 % IV SOLN: 250.0000 mL | INTRAVENOUS | Status: DC | PRN

## 2013-09-24 NOTE — Progress Notes (Signed)
CARE MANAGEMENT NOTE 09/24/2013  Patient:  [name], [name]   Account Number:  [id number]  Date Initiated:  09/24/2013  Documentation initiated by:  Jiles Crocker  Subjective/Objective Assessment:   ADMITTED FOR STROKE WORKUP     Action/Plan:   CM FOLLOWING FOR DCP   Anticipated DC Date:  09/27/2013   Anticipated DC Plan:  AWAITING FOR PT/OT EVALS FOR DISPOSITION NEEDS     DC Planning Services  CM consult         Status of service:  In process, will continue to follow Medicare Important Message given?   (If response is "NO", the following Medicare IM given date fields will be blank)  Per UR Regulation:  Reviewed for med. necessity/level of care/duration of stay  Comments:  9/9/2015Abelino Derrick RN,BSN,MHA [phone]

## 2013-09-24 NOTE — Progress Notes (Signed)
*  PRELIMINARY RESULTS* Vascular Ultrasound Carotid Duplex (Doppler) has been completed.  Preliminary findings: Bilateral:  1-39% ICA stenosis.  Vertebral artery flow is antegrade.     Farrel Demark, RDMS, RVT  09/24/2013, 10:18 AM

## 2013-09-24 NOTE — Evaluation (Signed)
Speech Language Pathology Evaluation Patient Details Name: [name] MRN: [id number] DOB: 10-26-1950 Today's Date: 09/24/2013 Time: 1430- 1510    Problem List:  Patient Active Problem List   Diagnosis Date Noted  . CVA (cerebral infarction) 09/23/2013  . Chronic systolic CHF (congestive heart failure) 08/18/2013  . Non-ischemic cardiomyopathy 08/11/2013  . Acute respiratory failure 08/11/2013  . Mitral regurgitation   . Acute systolic CHF (congestive heart failure), NYHA class 4 08/08/2013  . Pulmonary edema cardiac cause 08/07/2013  . Left bundle branch block 08/07/2013  . Demand ischemia 08/07/2013  . Preventative health care 04/09/2013   Past Medical History:  Past Medical History  Diagnosis Date  . GERD (gastroesophageal reflux disease)   . Arthritis   . PONV (postoperative nausea and vomiting)   . Systolic CHF     a. Dx 07/2013 - due to NICM. Cath normal cors. EF 25%.  [name] LBBB (left bundle branch block)   . Hypotension   . Mitral regurgitation     a. Mild-mod MR by echo 07/2013.   Past Surgical History:  Past Surgical History  Procedure Laterality Date  . Tonsillectomy    . Knee arthroscopy      rt  . Wrist arthroscopy  12/21/2010    Procedure: ARTHROSCOPY WRIST;  Surgeon: [name], MD;  Location: [facility];  Service: Orthopedics;  Laterality: Right;  right wrist, repair TFCC on right, open reconstruction ECU sheath   HPI:  63 yo F with recent diagnosis of HFrEF with EF of 25% and LBBB who presents with findings concerning for CVA.  Day prior to admission, she had increasing dysarthria and Right sided facial droop as well as difficulty ambulating.  SYmptoms progressed to day of admission and therefore family brought her to ED.  Negative CT scan, findings consistent with TIA versus stroke. MRI/MRA of head confirmed Acute ischemic right parietal lobe infarct involving the Right MCA.  Presented outside window for TPA.    Assessment / Plan /  Recommendation Clinical Impression  Patient presents with speech that is nearing her baseline level of function, with occassional motor planning errors and hesitations noted during conversation.  Speech is intelligible, and pt is aware and usually able to self-correct articulation errors.  Pt. agrees that is does not need f/u ST at this time, as her speech symptoms are quickly resolving.    SLP Assessment  Patient does not need any further Speech Lanaguage Pathology Services    Follow Up Recommendations  None;24 hour supervision/assistance    Frequency and Duration        Pertinent Vitals/Pain Pain Assessment: No/denies pain   SLP Goals     SLP Evaluation Prior Functioning  Cognitive/Linguistic Baseline: Within functional limits Type of Home: Apartment  Lives With: Spouse;Son (94 year old son currently without job) Available Help at Discharge: Family;Available 24 hours/day Education: Associates degree Vocation: Workers comp (Tree surgeon)   Cognition  Overall Cognitive Status: Within Systems developer for tasks assessed Arousal/Alertness: Awake/alert Orientation Level: Oriented X4 Attention: Divided Divided Attention: Appears intact Memory: Appears intact Awareness: Appears intact Problem Solving: Appears intact Safety/Judgment: Appears intact    Comprehension  Auditory Comprehension Overall Auditory Comprehension: Appears within functional limits for tasks assessed Yes/No Questions: Within Functional Limits Commands: Within Functional Limits Conversation: Complex Visual Recognition/Discrimination Discrimination: Not tested Reading Comprehension Reading Status: Within funtional limits    Expression Expression Primary Mode of Expression: Verbal Verbal Expression Overall Verbal Expression: Appears within functional limits for tasks assessed Initiation:  No impairment Level of Generative/Spontaneous Verbalization: Conversation Repetition: No impairment Naming:  No impairment Pragmatics: No impairment Written Expression Dominant Hand: Right Written Expression: Not tested   Oral / Motor Oral Motor/Sensory Function Overall Oral Motor/Sensory Function: Appears within functional limits for tasks assessed Motor Speech Overall Motor Speech: Appears within functional limits for tasks assessed Respiration: Within functional limits Phonation: Normal Resonance: Within functional limits Articulation: Impaired Level of Impairment: Conversation Intelligibility: Intelligible Motor Planning: Impaired Level of Impairment: Press photographer Errors: Inconsistent;Aware;Groping for words Effective Techniques: Slow rate;Increased vocal intensity;Over-articulate   GO     Maryjo Rochester T 09/24/2013, 3:13 PM

## 2013-09-24 NOTE — Progress Notes (Signed)
FMTS Attending Daily Note:  Renold Don MD  [phone] pager  Family Practice pager:  [phone] I have seen and examined this patient and have reviewed their chart. I have discussed this patient with the resident. I agree with the resident's findings, assessment and care plan.  Additionally:  - Unusual findings in that MRI did not match her Right sided weakness.  Unclear dominant hand and speech dominance.   - Needs echo.  This would change management if we noted thrombus.   - Does not have a PCP.  Can establish her with [facility] or [facility].   - Dietician per family request.  Case Management for help obtaining Plavix outside of the hospital.   Tobey Grim, MD 09/24/2013 3:58 PM

## 2013-09-24 NOTE — Consult Note (Signed)
Referring Physician: Gwendolyn Grant    Chief Complaint: Stroke  HPI:                                                                                                                                         [name] is an 63 y.o. female with known CHF who was taking ASA daily.  Patient awoke on Tuesday not feeling well. Her husband noted a right facial droop and slurred speech. He was concerned for stroke and called 9-1-1.  Patient was not a tPA candidate due to not knowing her last known well.  Initial CT scan of head was normal but follow up MRI showed a right brain CVA. Currently patients main complaint is slurred speech.  Denies HA, vertigo, double vision, difficulty swallowing, arm or leg weakness, confusion, or visual disturbances.  Date last known well: Date: 09/23/2013 Time last known well: Unable to determine tPA Given: No: out of window  Past Medical History  Diagnosis Date  . GERD (gastroesophageal reflux disease)   . Arthritis   . PONV (postoperative nausea and vomiting)   . Systolic CHF     a. Dx 07/2013 - due to NICM. Cath normal cors. EF 25%.  [name] LBBB (left bundle branch block)   . Hypotension   . Mitral regurgitation     a. Mild-mod MR by echo 07/2013.    Past Surgical History  Procedure Laterality Date  . Tonsillectomy    . Knee arthroscopy      rt  . Wrist arthroscopy  12/21/2010    Procedure: ARTHROSCOPY WRIST;  Surgeon: [name], MD;  Location: [facility];  Service: Orthopedics;  Laterality: Right;  right wrist, repair TFCC on right, open reconstruction ECU sheath    Family History  Problem Relation Age of Onset  . Congestive Heart Failure Mother    Social History:  reports that she has never smoked. She does not have any smokeless tobacco history on file. She reports that she does not drink alcohol or use illicit drugs.  Allergies:  Allergies  Allergen Reactions  . Onion Other (See Comments)    gas  . Tomato Other (See Comments)    reflux     Medications:  Scheduled: .  stroke: mapping our early stages of recovery book   Does not apply Once  . carvedilol  3.125 mg Oral BID WC  . clopidogrel  75 mg Oral Daily  . enoxaparin (LOVENOX) injection  40 mg Subcutaneous Q24H  . [START ON 09/25/2013] pneumococcal 23 valent vaccine  0.5 mL Intramuscular Tomorrow-1000  . sodium chloride  3 mL Intravenous Q12H    ROS:                                                                                                                                       History obtained from the patient  General ROS: negative for - chills, fatigue, fever, night sweats, weight gain or weight loss Psychological ROS: negative for - behavioral disorder, hallucinations, memory difficulties, mood swings or suicidal ideation Ophthalmic ROS: negative for - blurry vision, double vision, eye pain or loss of vision ENT ROS: negative for - epistaxis, nasal discharge, oral lesions, sore throat, tinnitus or vertigo Allergy and Immunology ROS: negative for - hives or itchy/watery eyes Hematological and Lymphatic ROS: negative for - bleeding problems, bruising or swollen lymph nodes Endocrine ROS: negative for - galactorrhea, hair pattern changes, polydipsia/polyuria or temperature intolerance Respiratory ROS: negative for - cough, hemoptysis, shortness of breath or wheezing Cardiovascular ROS: negative for - chest pain, dyspnea on exertion, edema or irregular heartbeat Gastrointestinal ROS: negative for - abdominal pain, diarrhea, hematemesis, nausea/vomiting or stool incontinence Genito-Urinary ROS: negative for - dysuria, hematuria, incontinence or urinary frequency/urgency Musculoskeletal ROS: negative for - joint swelling or muscular weakness Neurological ROS: as noted in HPI Dermatological ROS: negative for rash and skin lesion  changes  Neurologic Examination:                                                                                                      Blood pressure 107/46, pulse 76, temperature 97.7 F (36.5 C), temperature source Oral, resp. rate 20, SpO2 98.00%.   General: NAD Mental Status: Alert, oriented, thought content appropriate.  Speech dysarthric without evidence of aphasia.  Able to follow 3 step commands without difficulty. Cranial Nerves: II: Discs flat bilaterally; Visual fields grossly normal, pupils equal, round, reactive to light and accommodation III,IV, VI: ptosis not present, extra-ocular motions intact bilaterally V,VII: smile asymmetric on the right, facial light touch sensation normal bilaterally VIII: hearing normal bilaterally IX,X: gag reflex present XI: bilateral shoulder shrug XII: midline tongue extension without atrophy or fasciculations  Motor: Right : Upper extremity  5/5    Left:     Upper extremity   5/5  Lower extremity   5/5     Lower extremity   5/5 Tone and bulk:normal tone throughout; no atrophy noted Sensory: Pinprick and light touch intact throughout, bilaterally with neglect of the left arm on DSS Deep Tendon Reflexes:  Right: Upper Extremity   Left: Upper extremity   biceps (C-5 to C-6) 2/4   biceps (C-5 to C-6) 2/4 tricep (C7) 2/4    triceps (C7) 2/4 Brachioradialis (C6) 2/4  Brachioradialis (C6) 2/4  Lower Extremity Lower Extremity  quadriceps (L-2 to L-4) 2/4   quadriceps (L-2 to L-4) 2/4 Achilles (S1) 2/4   Achilles (S1) 2/4  Plantars: Right: downgoing   Left: downgoing Cerebellar: normal finger-to-nose,  normal heel-to-shin test Gait: not tested CV: pulses palpable throughout    Lab Results: Basic Metabolic Panel:  Recent Labs Lab 09/23/13 1029  NA 139  K 5.1  CL 106  CO2 21  GLUCOSE 73  BUN 15  CREATININE 0.68  CALCIUM 9.3    Liver Function Tests:  Recent Labs Lab 09/23/13 1029  AST 31  ALT 15  ALKPHOS 93  BILITOT  0.5  PROT 7.1  ALBUMIN 3.4*   No results found for this basename: LIPASE, AMYLASE,  in the last 168 hours No results found for this basename: AMMONIA,  in the last 168 hours  CBC:  Recent Labs Lab 09/23/13 1029  WBC 7.2  NEUTROABS 4.2  HGB 12.3  HCT 36.6  MCV 87.1  PLT 221    Cardiac Enzymes: No results found for this basename: CKTOTAL, CKMB, CKMBINDEX, TROPONINI,  in the last 168 hours  Lipid Panel:  Recent Labs Lab 09/23/13 1503  CHOL 166  TRIG 83  HDL 41  CHOLHDL 4.0  VLDL 17  LDLCALC 161*    CBG: No results found for this basename: GLUCAP,  in the last 168 hours  Microbiology: Results for orders placed during the hospital encounter of 08/06/13  MRSA PCR SCREENING     Status: None   Collection Time    08/07/13  3:22 AM      Result Value Ref Range Status   MRSA by PCR NEGATIVE  NEGATIVE Final   Comment:            The GeneXpert MRSA Assay (FDA     approved for NASAL specimens     only), is one component of a     comprehensive MRSA colonization     surveillance program. It is not     intended to diagnose MRSA     infection nor to guide or     monitor treatment for     MRSA infections.    Coagulation Studies:  Recent Labs  09/23/13 1029  LABPROT 13.4  INR 1.02    Imaging: Ct Head Wo Contrast  09/23/2013   CLINICAL DATA:  Frontal headaches  EXAM: CT HEAD WITHOUT CONTRAST  TECHNIQUE: Contiguous axial images were obtained from the base of the skull through the vertex without intravenous contrast.  COMPARISON:  None.  FINDINGS: The bony calvarium is intact. The ventricles are of normal size and configuration. No findings to suggest acute hemorrhage, acute infarction or space-occupying mass lesion are noted.  IMPRESSION: No acute intracranial abnormality.   Electronically Signed   By: Alcide Clever M.D.   On: 09/23/2013 11:19   Mr Brain Wo Contrast  09/23/2013   CLINICAL DATA:  Right-sided facial droop, dysarthria, expressive aphasia.  Concern for TIA  versus stroke.  EXAM: MRI HEAD WITHOUT CONTRAST  MRA HEAD WITHOUT CONTRAST  TECHNIQUE: Multiplanar, multiecho pulse sequences of the brain and surrounding structures were obtained without intravenous contrast. Angiographic images of the head were obtained using MRA technique without contrast.  COMPARISON:  Prior CT from earlier the same day.  FINDINGS: MRI HEAD FINDINGS  There is a new wedge-shaped area of restricted diffusion involving the cortical gray matter and subcortical white matter of the posterior right MCA territory in the right parietal lobe (series 4, image 20). Finding is consistent with acute ischemic infarct. Overall size of the infarct measures 1.8 x 3.5 cm. No associated intracranial hemorrhage. Minimal gyral edema seen on T2/FLAIR weighted sequence within this region. No significant mass effect.  Mild generalized atrophy present. Additional scattered patchy T2/FLAIR hyperintensity seen within the deep white matter both cerebral hemispheres noted, likely related to mild chronic small vessel ischemic disease.  No mass lesion or midline shift. Ventricles are normal in size without evidence of hydrocephalus. No extra-axial fluid collection.  Craniocervical junction is normal. Pituitary gland within normal limits. No acute abnormality seen about either orbit.  Calvarium and scalp soft tissues within normal limits.  Paranasal sinuses and mastoid air cells are clear.  MRA HEAD FINDINGS  ANTERIOR CIRCULATION:  Visualized distal cervical segments of the internal carotid arteries are widely patent with antegrade flow. Petrous and cavernous segments are widely patent. A1 segments are symmetric and well opacified. Anterior communicating artery within normal limits. Anterior cerebral arteries well opacified.  Right right MCA bifurcation is normal. The distal right MCA branches are grossly opacified, with no proximal branch occlusion.  Left M1 segment, left MCA bifurcation, and distal left MCA branches are  within normal limits. M1 segment is well opacified without evidence of proximal branch occlusion or hemodynamically significant stenosis.  POSTERIOR CIRCULATION:  The left vertebral artery is dominant. Posterior inferior cerebral arteries within normal limits. Vertebrobasilar junction and basilar artery are widely patent with antegrade flow. Posterior cerebral arteries and superior cerebellar arteries widely patent. No hemodynamically significant stenosis or proximal branch occlusion seen within the posterior circulation.  No intracranial aneurysm.  IMPRESSION: MRI HEAD IMPRESSION:  1. Acute ischemic right parietal lobe infarct involving the posterior right MCA territory. No associated hemorrhage or significant mass effect. 2. Atrophy with mild chronic small vessel ischemic changes involving the supratentorial white matter.  MRA HEAD IMPRESSION:  Unremarkable MRA of the brain with no hemodynamically significant stenosis, proximal branch occlusion, or other abnormality identified.   Electronically Signed   By: Rise Mu M.D.   On: 09/23/2013 22:39   Mr Maxine Glenn Head/brain Wo Cm  09/23/2013   CLINICAL DATA:  Right-sided facial droop, dysarthria, expressive aphasia. Concern for TIA versus stroke.  EXAM: MRI HEAD WITHOUT CONTRAST  MRA HEAD WITHOUT CONTRAST  TECHNIQUE: Multiplanar, multiecho pulse sequences of the brain and surrounding structures were obtained without intravenous contrast. Angiographic images of the head were obtained using MRA technique without contrast.  COMPARISON:  Prior CT from earlier the same day.  FINDINGS: MRI HEAD FINDINGS  There is a new wedge-shaped area of restricted diffusion involving the cortical gray matter and subcortical white matter of the posterior right MCA territory in the right parietal lobe (series 4, image 20). Finding is consistent with acute ischemic infarct. Overall size of the infarct measures 1.8 x 3.5 cm. No associated intracranial hemorrhage. Minimal gyral edema  seen on T2/FLAIR weighted sequence within this region. No significant mass effect.  Mild  generalized atrophy present. Additional scattered patchy T2/FLAIR hyperintensity seen within the deep white matter both cerebral hemispheres noted, likely related to mild chronic small vessel ischemic disease.  No mass lesion or midline shift. Ventricles are normal in size without evidence of hydrocephalus. No extra-axial fluid collection.  Craniocervical junction is normal. Pituitary gland within normal limits. No acute abnormality seen about either orbit.  Calvarium and scalp soft tissues within normal limits.  Paranasal sinuses and mastoid air cells are clear.  MRA HEAD FINDINGS  ANTERIOR CIRCULATION:  Visualized distal cervical segments of the internal carotid arteries are widely patent with antegrade flow. Petrous and cavernous segments are widely patent. A1 segments are symmetric and well opacified. Anterior communicating artery within normal limits. Anterior cerebral arteries well opacified.  Right right MCA bifurcation is normal. The distal right MCA branches are grossly opacified, with no proximal branch occlusion.  Left M1 segment, left MCA bifurcation, and distal left MCA branches are within normal limits. M1 segment is well opacified without evidence of proximal branch occlusion or hemodynamically significant stenosis.  POSTERIOR CIRCULATION:  The left vertebral artery is dominant. Posterior inferior cerebral arteries within normal limits. Vertebrobasilar junction and basilar artery are widely patent with antegrade flow. Posterior cerebral arteries and superior cerebellar arteries widely patent. No hemodynamically significant stenosis or proximal branch occlusion seen within the posterior circulation.  No intracranial aneurysm.  IMPRESSION: MRI HEAD IMPRESSION:  1. Acute ischemic right parietal lobe infarct involving the posterior right MCA territory. No associated hemorrhage or significant mass effect. 2. Atrophy  with mild chronic small vessel ischemic changes involving the supratentorial white matter.  MRA HEAD IMPRESSION:  Unremarkable MRA of the brain with no hemodynamically significant stenosis, proximal branch occlusion, or other abnormality identified.   Electronically Signed   By: Rise Mu M.D.   On: 09/23/2013 22:39       Assessment and plan discussed with with attending physician and they are in agreement.    Felicie Morn PA-C [facility] [phone]  09/24/2013, 2:29 PM   Assessment: 63 y.o. female with acute right brain CVA which likely is cardio embolic in source.  On exam patient shows slurred speech and right lower facial droop.  Etiology of the right lower facial droop is not entirely clear as her stroke is on the right, however cannot rule out micro emboli not visualized on MRI.   Stroke Risk Factors - none  1. HgbA1c,  2. PT consult, OT consult, Speech consult 3. Echocardiogram 4. Prophylactic therapy-Antiplatelet med: Plavix - dose 75 mg daily 5. Risk factor modification 6. Telemetry monitoring 7. Frequent neuro checks 8. Stroke MD to follow  Patient seen and examined together with physician assistant and I concur with the assessment and plan.  Wyatt Portela, MD

## 2013-09-24 NOTE — Evaluation (Signed)
Physical Therapy Evaluation Patient Details Name: COLE KLUGH MRN: [id number] DOB: 1950/03/29 Today's Date: 09/24/2013   History of Present Illness  Gaylia Kassel. Zalewski is a 63 y.o. Female admitted 09/23/13 with c/o Expressive aphasia and dysarthria. MRI showed Acute ischemic right parietal lobe infarct involving the posterior right MCA territory.   Clinical Impression  Pt admitted with above. Pt's primary complaint is her speech. Pt functioning near baseline however con't to have balance and higher level balance deficits. Pt aware she would benefit from use of cane for outside ambulation due to uneven terrain. Pt with decreased activity tolerance due to h/o CHF. Pt with good home set up and support. Acute PT to follow to address balance and higher level balance deficits.    Follow Up Recommendations No PT follow up;Supervision/Assistance - 24 hour    Equipment Recommendations  None recommended by PT (pt has cane she can use of outside mobility)    Recommendations for Other Services       Precautions / Restrictions Precautions Precautions: Fall Restrictions Weight Bearing Restrictions: No      Mobility  Bed Mobility Overal bed mobility: Modified Independent             General bed mobility comments: pt sitting EOB with OT upon PT arrival  Transfers Overall transfer level: Needs assistance Equipment used: None Transfers: Sit to/from Stand Sit to Stand: Min guard         General transfer comment: min guard for safety due to first time getting up  Ambulation/Gait Ambulation/Gait assistance: Min guard Ambulation Distance (Feet): 600 Feet Assistive device: None Gait Pattern/deviations: Step-through pattern;Decreased stride length   Gait velocity interpretation: Below normal speed for age/gender General Gait Details: no episodes of LOB however mildly unsteady. + SOB, pt reports "I don't have the energy I did since I've gotten CHF."  Stairs Stairs: Yes Stairs  assistance: Min guard Stair Management: One rail Right;Step to pattern Number of Stairs: 4 General stair comments: + SOB requiring seated rest break  Wheelchair Mobility    Modified Rankin (Stroke Patients Only) Modified Rankin (Stroke Patients Only) Pre-Morbid Rankin Score: No significant disability Modified Rankin: Slight disability     Balance                                 Standardized Balance Assessment Standardized Balance Assessment : Dynamic Gait Index   Dynamic Gait Index Level Surface: Normal Change in Gait Speed: Normal Gait with Horizontal Head Turns: Normal Gait with Vertical Head Turns: Normal Gait and Pivot Turn: Normal Step Over Obstacle: Mild Impairment Step Around Obstacles: Mild Impairment Steps: Mild Impairment Total Score: 21       Pertinent Vitals/Pain Pain Assessment: No/denies pain    Home Living Family/patient expects to be discharged to:: Private residence Living Arrangements: Spouse/significant other Available Help at Discharge: Family;Available 24 hours/day Type of Home: Apartment Home Access: Level entry     Home Layout: One level Home Equipment: Grab bars - tub/shower      Prior Function Level of Independence: Independent         Comments: pt's husband reports that he and son can do the cooking and cleaning     Hand Dominance   Dominant Hand: Right    Extremity/Trunk Assessment   Upper Extremity Assessment: Defer to OT evaluation           Lower Extremity Assessment: Overall WFL for tasks assessed  Cervical / Trunk Assessment: Normal  Communication   Communication: Expressive difficulties (word finding difficulties)  Cognition Arousal/Alertness: Awake/alert Behavior During Therapy: WFL for tasks assessed/performed Overall Cognitive Status: Within Functional Limits for tasks assessed                      General Comments      Exercises Other Exercises Other Exercises: Pt  demonstrated find motor coorindation activity of thumb-to-fingers and engagement in her hobbies (crochet, cards) to increase fine motor function.       Assessment/Plan    PT Assessment Patient needs continued PT services  PT Diagnosis Difficulty walking   PT Problem List Decreased strength;Decreased activity tolerance;Decreased balance;Decreased mobility  PT Treatment Interventions DME instruction;Gait training;Stair training;Functional mobility training;Therapeutic activities;Therapeutic exercise;Balance training;Neuromuscular re-education   PT Goals (Current goals can be found in the Care Plan section) Acute Rehab PT Goals Patient Stated Goal: home PT Goal Formulation: With patient Time For Goal Achievement: 10/01/13 Potential to Achieve Goals: Good Additional Goals Additional Goal #1: Pt to score >52 on the Berg Balance Test to indicate minimal falls risk.    Frequency Min 3X/week   Barriers to discharge        Co-evaluation               End of Session Equipment Utilized During Treatment: Gait belt Activity Tolerance: Patient tolerated treatment well Patient left: in chair;with call bell/phone within reach;with family/visitor present Nurse Communication: Mobility status         Time: 0915-0940 PT Time Calculation (min): 25 min   Charges:   PT Evaluation $Initial PT Evaluation Tier I: 1 Procedure PT Treatments $Gait Training: 8-22 mins   PT G CodesMarcene Brawn 09/24/2013, 11:11 AM  Lewis Shock, PT, DPT Pager #: [phone] Office #: [phone]

## 2013-09-24 NOTE — Discharge Summary (Signed)
Family Medicine Teaching [facility] Discharge Summary  Patient name: [name] Medical record number: [id number] Date of birth: [birth year] Age: 63 y.o. Gender: female Date of Admission: 09/23/2013  Date of Discharge: 09/26/2013 Admitting Physician: Tobey Grim, MD  Primary Care Provider: [name], MD Consultants: Neurology, Cardiology   Indication for Hospitalization: Acute ischemic right parietal infarct  Discharge Diagnoses/Problem List:  Acute ischemic right parietal infarct Systolic CHF 2/2 to NICM Hypotension  Disposition: Discharge to home  Discharge Condition: Stable  Discharge Exam:  General: Well-appearing woman in no acute distress. Dysarthria and word finding present but improved since admission.  HEENT: PERRLA. EOMI. MMM. NT/[place].  Cardiovascular: RRR no m/r/g. S3 present. No carotid bruits. No JVD.  Respiratory: CTAB. No wheezes or crackles. Normal WOB.  Abdomen: +bs, non-distended, non-tender.  Extremities: No lower extremity edema.  Skin: No lesions present.  Neuro: CN II-XII intact. Right facial droop without forehead involvement. 5/5 strength and sensation intact throughout. Dysarthria and word finding improved significantly since admission with only minimal reminaing deficit   Brief Hospital Course:  [name] is a 63 y.o. female with a PMH significant for systolic CHF (EF 82%) and LBBB who presents with forehead-sparing right-sided facial droop, dysarthria, and word finding difficulties with evidence of acute ischemic right parietal infarct on MRI (9/8).   Acute ischemic right parietal infarct: Patient presented with forehead-sparing right-sided facial droop, dysarthria, and word finding and was found to have evidence of acute ischemic right parietal infarct on MRI (9/8). Symptoms improved since admission with only minimal dysarthria and word finding upon discharge. Neuroanatomy of right parietal infarct and right facial droop inconsistent,  but cannot rule out micro emoblic not visualized on MRI. Initial CT head negative, did not present in time to consider TPA. Echo (9/10) showed EF 15%, normal cavity size, diffuse hypokinesis, modderate mitral regurgitation, mildly dilated LA and no evidence of thrombus. TEE (9/11) did not show evidence of thrombus. Unclear stroke etiology, though she is high-risk for cardioembolic source given CHF with EF of 15% (decreased from 25% in 07/2013) with mitral regurgitation and mild atrial enlargement, which could predispose stasis and afib. No afib on telemetry thus far, though could be paroxysmal. Other possible etiologies include systemic hypoperfusion in setting of hypotension and CHF or thrombosis from vascular disease, though patient does not have classic RFs including diabetes, HTN, HLD. She failed outpatient therapy on ASA 81 and thus Plavix was started upon admission. Anti-coagulation with warfarin was considered but cardiology recommended against it without visualized LV thrombus or known atrial fibrillation, though dilated cardiomyopathy does increase her risk of cardioembolic events. They recommended long-term monitoring for atrial fibrillation with a Linq monitor and close-follow up in the CHF clinic. She will follow-up regarding loop recorder and possible pacemaker placement as an outpatient. She will continue on Plavix for secondary prevention of CVAs.   Her initial EKG showed a LBBB, consistent with prior EKGs. Her HgA1c was 5.6 and lipids were choles 166, HDL 41, LDL 108. ASCVD risk 3.6%, however, in setting of ischemic event started on atorvostatin .  Her carotid dopplers did not have significant stenosis (1-39% stenosis bilaterally). PT/OT do not recommend outpatient follow-up.   Worsening systolic CHF (EF 95-->62%) 2/2 to NICM: Clinically euvolemic since admission. Echo in 07/2013 showed EF 25%, diffuse hypokinesis, severely dilated cavity size, elevated ventricular end-diastolic filling and  left artrial filling pressure, and mild-moderate mitral regurgitation. Subsequent catheterization did not show evidence of CAD, thus systolic CHF felt to be  secondary to NICM. Echo (9/10) showed EF 15%, normal cavity size, diffuse hypokinesis, modderate mitral regurgitation, mildly dilated LA, and no evidence of thrombus. TEE (9/11) did not show evidence of thrombus. Per above, anti-coagulation with warfarin was considered but cardiology recommended against it without visualized LV thrombus or known atrial fibrillation, though dilated cardiomyopathy does increase her risk of cardioembolic events. They recommended long-term monitoring for atrial fibrillation with a Linq monitor and close-follow up in the CHF clinic. She will follow-up regarding loop recorder and possible pacemaker placement as an outpatient. She will continue on Plavix for secondary prevention of CVAs.   Unclear etiology of CHF, though could represent sequelae of myocarditis without presence of risk factors. Deferred cardiac MRI to evaluate for myocarditis as will not alter management. No history of alcohol use. No evidence of restrictive disease on echo that would point to infiltrative disease. She was continued on home Coreg 3.125mg . Her home prn Lasix was not used during admission as she was euvolemic. She was not started on a low-dose ACEi because she was hypotensive throughout admission and did not want to precipitate further hypotension in setting of recent CVA, per neurology/cardiology recommendations.    Hypotension: Runs in the 100s-110s/50-60s. Had intermittent hypotension during last hospitalization and therefore was started on prn Lasix (rather than daily). Was not started on ACE/ARB during last hospitalization due to hypotension. She was not started on a low-dose ACEi to as she was hypotensive throughout admission and did not want to precipitate further hypotension in setting of recent CVA.   Issues for Follow Up:  1) Follow-up  with cardiology regarding possible placement of loop recorder, pacemaker 2) Consider adding low-dose ACEi for mortality benefit if BP allows it  Significant Procedures: TTE and TEE  Significant Labs and Imaging:   Recent Labs Lab 09/23/13 1029  WBC 7.2  HGB 12.3  HCT 36.6  PLT 221    Recent Labs Lab 09/23/13 1029  NA 139  K 5.1  CL 106  CO2 21  GLUCOSE 73  BUN 15  CREATININE 0.68  CALCIUM 9.3  ALKPHOS 93  AST 31  ALT 15  ALBUMIN 3.4*    HgA1c 5.6  Lipids (cholesterol 166, triglyercides 83, HDL 41, LDL 108)   Imaging/Diagnostic Tests:   Ct Head Wo Contrast 09/23/2013  IMPRESSION: No acute intracranial abnormality.   Mr Brain Wo Contrast 09/23/2013  1. Acute ischemic right parietal lobe infarct involving the  posterior right MCA territory. No associated hemorrhage or  significant mass effect.  2. Atrophy with mild chronic small vessel ischemic changes involving  the supratentorial white matter.   Mr Maxine Glenn Head/brain Wo Contrast 09/23/2013  Unremarkable MRA of the brain with no hemodynamically significant stenosis, proximal branch occlusion, or other abnormality identified.   Carotid dopplers: 09/24/2013  - The vertebral arteries appear patent with antegrade flow. - Findings consistent with 1-39 percent stenosis involving the right internal carotid artery and the left internal carotid artery.   Results/Tests Pending at Time of Discharge: None.  Discharge Medications:    Medication List    STOP taking these medications       aspirin 81 MG EC tablet      TAKE these medications       atorvastatin 40 MG tablet  Commonly known as:  LIPITOR  Take 1 tablet (40 mg total) by mouth daily at 6 PM.     carvedilol 3.125 MG tablet  Commonly known as:  COREG  Take 1 tablet (3.125 mg total) by  mouth 2 (two) times daily with a meal.     clopidogrel 75 MG tablet  Commonly known as:  PLAVIX  Take 1 tablet (75 mg total) by mouth daily.     furosemide 20 MG tablet   Commonly known as:  LASIX  Take 1 tablet (20 mg) by mouth once daily as needed for fluid weight gain (more than 3 lbs from "dry weight").        Discharge Instructions: Please refer to Patient Instructions section of EMR for full details.  Patient was counseled important signs and symptoms that should prompt return to medical care, changes in medications, dietary instructions, activity restrictions, and follow up appointments.   Follow-Up Appointments: Follow-up Information   Follow up with [name], MD On 09/30/2013. (at 10:45 Garage Code [id number] )    Specialty:  Cardiology   Contact information:   [address] [place].  [address] [place] [address] [phone]       Call SETHI,PRAMOD, MD. (For a follow up appointment )    Specialties:  Neurology, Radiology   Contact information:   [address] [place] [address] [phone]      Hardie Pulley Acting Intern, [facility]  I have seen the above patient with student doctor Sharrell Ku and agree with the note.  Everlene Other DO Family Medicine PGY-3

## 2013-09-24 NOTE — H&P (Signed)
FMTS Attending Admission Note: Renold Don MD Personal pager:  [phone] FPTS Service Pager:  [phone]  I  have seen and examined this patient, reviewed their chart. I have discussed this patient with the resident. I agree with the resident's findings, assessment and care plan.  Additionally: 63 yo F with recent diagnosis of HFrEF with EF of 25% and LBBB who presents with findings concerning for CVA.  Day prior to admission, she had increasing dysarthria and Right sided facial droop as well as difficulty ambulating.  SYmptoms progressed to day of admission and therefore family brought her to ED.  Negative CT scan, findings consistent with TIA versus stroke.  On my examination, still with some residual dysarthria and Right hand grip weakness.  As over 24 hours, likely diagnosis of CVA confirmed with MRI/MRA of head.  Presented outside window for thrombolysis.    Exam: Gen:  Elderly female, lying in bed, NAD Heart:  Grade III/VI SEM noted.   Lungs:  Clear anterior chest Abd:  Benign Neuro:  Some dysarthria with most words.  Can produce coherent sentences, not aphasic.  Right handgrip strength 3/5, Rigth forearm is 4/5.  Left is 5/5.  BL LE's symmetric 5/5 strength.  Finger to nose WNL.  Imp/Plan: 1.  Acute CVA:   - R MCA distribution.  Unusual finding in light of Right sided weakness and dysarthria.   - Carotid dopplers pending.  She needs to have maximized medical therapy, including plavix as was on ASA prior to admit - Needs statin - Agree with speech/PT/OT.  Awaiting their input  2.  HFrEF: - Fairly new diagnosis as of 2 months ago. - Agree with maximization of medical therapy as BP allows. - do not see that she is on ACE at home.  Recommend starting this.  On record review, looks like this was recommended at Cardiology outpt appt last month.   - if hypotension persists, may need to get cards involved in patient with HF with decreased EF and persistent hypotension.    Tobey Grim,  MD 09/24/2013 9:45 AM

## 2013-09-24 NOTE — Progress Notes (Signed)
Family Medicine Teaching Service Daily Progress Note Intern Pager: [phone]  Patient name: [name] Medical record number: [id number] Date of birth: 1950/02/24 Age: 63 y.o. Gender: female  Primary Care Provider: [name], MD Consultants: Neurology Code Status: Full  Pt Overview and Major Events to Date:  9/8: Patient admitted with forehead-sparing right-sided facial droop, dysarthria, expressive aphasia; evidence of acute ischemic right parietal infarct  Assessment and Plan:  [name] is a 63 y.o. female with a PMH significant for systolic CHF (EF 86%) and LBBB who presents with forehead-sparing right-sided facial droop, dysarthria, and expressive aphasia with evidence of acute ischemic right parietal infarct on MRI (9/8).   Acute ischemic right parietal infarct: Patient presented with forehead-sparing right-sided facial droop, dysarthria, and expressive aphasia and was found to have evidence of acute ischemic right parietal infarct on MRI (9/8). Unclear if stroke neuroanatomy fits with clinical picture (she is R-handed). Initial CT head negative, did not present in time to consider TPA.  Possible cardioembolic source as patient has mitral regurgitation that could predispose to left atrial enlargement and fibrillation. High-risk for cardiac source given severe CHF with EF of 25%. Regular rate and rhythm on exam and EKG, though could have paroxysmal afib. Possibly secondary to systemic hypoperfusion, as a patient has a history of hypotension which could predispose to watershed infarcts. Possibly secondary to thrombosis from vascular disease, though patient does not have classic RFs including diabetes, HTN, HLD.  -- Failed outpatient therapy with ASA 81; d/c ASA and start Plavix  -- MRI/MRA (9/8): Acute ischemic right parietal lobe infarct involving the posterior right MCA territory. No associated hemorrhage or significant mass effect. - Stroke team to see today given  inconsistent findings on exam and infarct location -- F/u echo -- Neuro checks q4h  -- Telemetry monitoring  -- EKG with LBBB, no evidence of ischemia; LBBB on previous EKGs  -- HgA1c 5.6; Lipids (choles 166, HDL 41, LDL 108) --> ASCVD risk 3.6%, statin not indicated -- Carotid dopplers without signficant stenosis (1-39% bilaterally) -- PT/OT do not recommend follow-up  Systolic CHF 2/2 to NICM: Clinically euvolemic on admission, at dry weight of 159lbs. Most recent echo (07/2013) showed EF 25%, diffuse hypokinesis, severely dilated cavity size, elevated ventricular end-diastolic filling and left artrial filling pressure, and mild-moderate mitral regurgitation. Subsequent catheterization did not show evidence of CAD, thus systolic CHF felt to be secondary to NICM. Unclear etiology.  -- Continue home Coreg 3.125 BID -- Holding home Lasix (  prn) --  Consider starting low dose ACEi  #Hypotension: Appears to run in the 100s-110s/50-60s. Had intermittent hypotension during last hospitalization and therefore was started on prn Lasix (rather than daily). Was not started on ACE/ARB during last hospitalization due to hypotension. Unclear why initially started on an ACE rather than a beta-blocker.  -- Continue to monitor  -- Starting low dose ACEi  FEN/GI: Regular diet, KVO Prophylaxis: Lovenox   Disposition: Telemetry; Possible D/C later today.   Subjective:  Reports that her HA has improved since admission. She feels like her word finding and speech have improved. She reports no trouble with gait or strength. She denies SOB, CP, palpitations, or trouble ambulating.   Objective: Temp:  [97.2 F (36.2 C)-98.7 F (37.1 C)] 98.1 F (36.7 C) (09/09 0400) Pulse Rate:  [60-91] 73 (09/09 0400) Resp:  [12-20] 16 (09/09 0400) BP: (94-131)/(42-92) 94/55 mmHg (09/09 0400) SpO2:  [96 %-100 %] 97 % (09/09 0400)  Physical Exam General: Well-appearing woman in no  acute distress. Dysarthria and word  finding present but improved since admission. HEENT: PERRLA. EOMI. MMM. NT/[place].  Cardiovascular: RRR no m/r/g. S3 present. No carotid bruits. No JVD.  Respiratory: CTAB. No wheezes or crackles. Normal WOB.  Abdomen: +bs, non-distended, non-tender.  Extremities: Trace pitting edema in bilateral lower extremities.  Skin: No lesions present.  Neuro: CN II-XII intact. Right facial droop without forehead involvement. 5/5 strength throughout, right handgrip strength improved. Dysarthria improved since yesterday. Difficulty with word finding though can pronounce words and understand others.   Laboratory:  Recent Labs Lab 09/23/13 1029  WBC 7.2  HGB 12.3  HCT 36.6  PLT 221    Recent Labs Lab 09/23/13 1029  NA 139  K 5.1  CL 106  CO2 21  BUN 15  CREATININE 0.68  CALCIUM 9.3  PROT 7.1  BILITOT 0.5  ALKPHOS 93  ALT 15  AST 31  GLUCOSE 73   HgA1c 5.6 Lipids (cholesterol 166, triglyercides 83, HDL 41, LDL 108)  Imaging/Diagnostic Tests: Ct Head Wo Contrast      09/23/2013 IMPRESSION: No acute intracranial abnormality.     Mr Brain Wo Contrast      09/23/2013 1. Acute ischemic right parietal lobe infarct involving the  posterior right MCA territory. No associated hemorrhage or  significant mass effect.  2. Atrophy with mild chronic small vessel ischemic changes involving  the supratentorial white matter.   Mr Maxine Glenn Head/brain Wo Contrast    09/23/2013 Unremarkable MRA of the brain with no hemodynamically significant stenosis, proximal branch occlusion, or other abnormality identified.    Carotid dopplers: 09/24/2013 Summary: - The vertebral arteries appear patent with antegrade flow. - Findings consistent with 1-39 percent stenosis involving the right internal carotid artery and the left internal carotid artery.   Resa Miner, Med Student 09/24/2013, 7:22 AM Acting Intern, [facility] FPTS Intern pager: [phone], text pages welcome  I have seen and  evaluated the above patient.  Addendum in blue. My physical exam is below:  General: Well-appearing, NAD.  Cardiovascular: RRR no m/r/g. Respiratory: CTAB. No rales, rhonchi, or wheezing.  Extremities: Trace edema bilaterally.  Neuro: CN II-XII intact. Mild right facial droop noted. Word finding difficulty noted. 4/5 RUE weakness noted.   Everlene Other DO Family Medicine PGY-3

## 2013-09-24 NOTE — Progress Notes (Signed)
Occupational Therapy Evaluation Patient Details Name: [name] MRN: [id number] DOB: 1950-05-02 Today's Date: 09/24/2013    History of Present Illness [name] is a 63 y.o. Female admitted 09/23/13 with c/o Expressive aphasia and dysarthria. MRI showed Acute ischemic right parietal lobe infarct involving the posterior right MCA territory.    Clinical Impression   PTA pt lived at home with her husband and son and was independent with ADLs and functional mobility. Pt family reports that they will be with pt 24/7 for assistance and supervision. Pt currently at min guard level for ADLs and functional mobility for safety with first time up. However, pt will quickly progress to Supervision. Encouraged pt to read out loud and continue to converse to work on expressive language. No further acute OT needs. Educated pt/family on signs and symptoms of stroke.     Follow Up Recommendations  No OT follow up;Supervision - Intermittent    Equipment Recommendations  Tub/shower seat    Recommendations for Other Services       Precautions / Restrictions Precautions Precautions: Fall Restrictions Weight Bearing Restrictions: No      Mobility Bed Mobility Overal bed mobility: Modified Independent                Transfers Overall transfer level: Needs assistance Equipment used: None Transfers: Sit to/from Stand Sit to Stand: Min guard         General transfer comment: Min guard for safety.          ADL Overall ADL's : Needs assistance/impaired Eating/Feeding: Independent;Sitting   Grooming: Min guard;Standing   Upper Body Bathing: Set up;Sitting   Lower Body Bathing: Sit to/from stand;Min guard   Upper Body Dressing : Set up;Sitting   Lower Body Dressing: Min guard;Sit to/from stand   Toilet Transfer: Min guard;Ambulation;Regular Social worker and Hygiene: Min guard;Sit to/from stand       Functional mobility during ADLs: Min  guard General ADL Comments: Pt presents with expressive communication difficulties, but reports these are improving. Strength WFL in Bil UEs with minimal weakness in RUE (shoulder and triceps) and slightly decreased coordination. Pt able to remove socks sitting EOB and ambulate to bathroom with min guard assist. Pt educated on fine motor coordination activites and reports that she enjoys playing cards and crocheting, and encouraged pt to continue these activities. Pt read headlines and captions from her newspaper out loud to work on expressive language deficits. Educated pt and family on signs and symptoms of stroke and getting medical assistance ASAP if symptoms occur.  Despite reporting that she is Right handed, pt used Left hand for most activities. She does report that her Right hand is tender from the IV so difficulty to distinguish R inattention vs pain.      Vision  Pt wears glasses at all times and reports no change from baseline. Pt has no apparent visual deficits.                    Perception Perception Perception Tested?: No   Praxis Praxis Praxis tested?: Within functional limits    Pertinent Vitals/Pain Pain Assessment: No/denies pain     Hand Dominance Right   Extremity/Trunk Assessment Upper Extremity Assessment Upper Extremity Assessment: Overall WFL for tasks assessed   Lower Extremity Assessment Lower Extremity Assessment: Defer to PT evaluation   Cervical / Trunk Assessment Cervical / Trunk Assessment: Normal   Communication Communication Communication: Expressive difficulties   Cognition Arousal/Alertness: Awake/alert Behavior  During Therapy: WFL for tasks assessed/performed Overall Cognitive Status: Within Functional Limits for tasks assessed                        Exercises Exercises: Other exercises Other Exercises Other Exercises: Pt demonstrated find motor coorindation activity of thumb-to-fingers and engagement in her hobbies  (crochet, cards) to increase fine motor function.         Home Living Family/patient expects to be discharged to:: Private residence Living Arrangements: Spouse/significant other;Children Available Help at Discharge: Family;Available 24 hours/day Type of Home: Apartment Home Access: Level entry     Home Layout: One level     Bathroom Shower/Tub: Tub/shower unit Shower/tub characteristics: Engineer, building services: Standard     Home Equipment: Grab bars - tub/shower          Prior Functioning/Environment Level of Independence: Independent        Comments: pt's husband reports that he and son can do the cooking and cleaning                              End of Session  Activity Tolerance: Patient tolerated treatment well Patient left: Other (comment);with family/visitor present (sitting EOB with PT)   Time: 1610-9604 OT Time Calculation (min): 38 min (some time spent with MD in room) Charges:  OT General Charges $OT Visit: 1 Procedure OT Evaluation $Initial OT Evaluation Tier I: 1 Procedure OT Treatments $Self Care/Home Management : 8-22 mins  Rae Lips [phone] 09/24/2013, 9:46 AM

## 2013-09-25 DIAGNOSIS — I509 Heart failure, unspecified: Secondary | ICD-10-CM

## 2013-09-25 DIAGNOSIS — I5022 Chronic systolic (congestive) heart failure: Secondary | ICD-10-CM

## 2013-09-25 DIAGNOSIS — I9589 Other hypotension: Secondary | ICD-10-CM | POA: Diagnosis present

## 2013-09-25 DIAGNOSIS — I059 Rheumatic mitral valve disease, unspecified: Secondary | ICD-10-CM

## 2013-09-25 DIAGNOSIS — I5021 Acute systolic (congestive) heart failure: Secondary | ICD-10-CM

## 2013-09-25 MED ORDER — ATORVASTATIN CALCIUM 40 MG PO TABS
40.0000 mg | ORAL_TABLET | Freq: Every day | ORAL | Status: DC
  Administered 2013-09-25: 40 mg via ORAL
  Filled 2013-09-25: qty 1

## 2013-09-25 MED ORDER — SENNOSIDES-DOCUSATE SODIUM 8.6-50 MG PO TABS
1.0000 | ORAL_TABLET | Freq: Every day | ORAL | Status: DC
  Administered 2013-09-25: 1 via ORAL
  Filled 2013-09-25: qty 1

## 2013-09-25 MED ORDER — POLYETHYLENE GLYCOL 3350 17 G PO PACK
17.0000 g | PACK | Freq: Every day | ORAL | Status: DC
  Administered 2013-09-25: 17 g via ORAL
  Filled 2013-09-25: qty 1

## 2013-09-25 MED ORDER — LISINOPRIL 2.5 MG PO TABS
2.5000 mg | ORAL_TABLET | Freq: Every day | ORAL | Status: DC
  Filled 2013-09-25: qty 1

## 2013-09-25 NOTE — Progress Notes (Signed)
Talked to patient's spouse, patient not in room at this time about DCP/ unable to afford Plavix; Spouse stated that they are on a fixed income and having difficulty affording their medication; Prescription coupon and application for medication assistance through [employer] patient assistance drug company - application to be filled out by the PCP and the patient and mailed off to the prescription drug company for to see if she qualifies for medication assistance ongoing; Plavix is generic and spouse stated that a month supply at their pharmacy is around $15.00 - he has the funds available to fill that prescription at discharge and they will be working on a plan to continue to get their medication; B Ave Filter RN,BSN,MHA  Patient Assistance Connection through [employer] medication at no cost to patients who meet program eligibility requirements. This component of the program is made possible through the [employer] for [employer]. Eligibility requirements include: Patient must be a [place] citizen or resident and be under the care of a licensed healthcare provider authorized to prescribe, dispense and administer medicine in the [place]  Patient must have no insurance coverage or no access to the prescribed product or treatment via their insurance  Patient must not be eligible for Medicare or Medicaid  See program application for Medicare Part D eligibility criteria Patient must meet the following financial criteria:  Annual household income of ?250% of the current Federal Poverty Level for all non-Oncology/non-Hematology Products  Annual household income of ?500% of the current Federal Poverty Level for all Oncology and Hematology Products

## 2013-09-25 NOTE — Progress Notes (Signed)
Nutrition Education Note  RD consulted per patient request for nutrition education regarding new onset CHF.  RD provided "Low Sodium Nutrition Therapy" handout from the [employer]. Reviewed patient's dietary recall. Provided examples on ways to decrease sodium intake in diet. Discouraged intake of processed foods and use of salt shaker. Encouraged fresh fruits and vegetables as well as whole grain sources of carbohydrates to maximize fiber intake.   RD discussed why it is important for patient to adhere to diet recommendations, and emphasized the role of fluids, foods to avoid, and importance of weighing self daily. Teach back method used.  Expect great compliance.  Current diet order is Heart Healthy, patient is consuming approximately 80% of meals at this time. Labs and medications reviewed. No further nutrition interventions warranted at this time. RD contact information provided. If additional nutrition issues arise, please re-consult RD.   Ian Malkin RD, LDN Inpatient Clinical Dietitian Pager: [phone] After Hours Pager: [phone]

## 2013-09-25 NOTE — Progress Notes (Signed)
Family Medicine Teaching Service Daily Progress Note Intern Pager: [phone]  Patient name: [name] Medical record number: [id number] Date of birth: 05/06/1950 Age: 63 y.o. Gender: female  Primary Care Provider: [name], MD Consultants: Neurology Code Status: Full  Pt Overview and Major Events to Date:  9/8: Patient admitted with forehead-sparing right-sided facial droop, dysarthria, expressive aphasia 9/9: MRI showing acute ischemic right parietal infarct, carotid dopplers normal  9/10: ECHO  Assessment and Plan:  [name] is a 63 y.o. female with a PMH significant for systolic CHF (EF 87%) and LBBB who presents with forehead-sparing right-sided facial droop, dysarthria, and expressive aphasia with evidence of acute ischemic right parietal infarct on MRI (9/8).   Acute ischemic right parietal infarct: Patient presented with forehead-sparing right-sided facial droop, dysarthria, and expressive aphasia and was found to have evidence of acute ischemic right parietal infarct on MRI (9/8). Etiology of right facial droop unclear, per neuro cannot r/o micro emoblic not visualized on MRI. Initial CT head negative, did not present in time to consider TPA. Possible cardioembolic source as patient has mitral regurgitation that could predispose to left atrial enlargement and fibrillation. High-risk for cardiac source given severe CHF with EF of 25%. Regular rate and rhythm on exam and EKG, though could have paroxysmal afib. Possibly secondary to systemic hypoperfusion, as a patient has a history of hypotension which could predispose to watershed infarcts. Possibly secondary to thrombosis from vascular disease, though patient does not have classic RFs including diabetes, HTN, HLD.  -- Neurology consulted, they note that cannot r/o micro emboli not visualized on MRI causing clinical sx (i.e. Right facial droop) not consistent with infarct location -- Failed outpatient therapy with ASA 81;  d/c ASA and start Plavix  -- MRI/MRA (9/8): Acute ischemic right parietal lobe infarct involving the posterior right MCA territory. No associated hemorrhage or significant mass effect.  -- F/u echo  -- Neuro checks q4h  -- Telemetry monitoring  -- EKG with LBBB, no evidence of ischemia; LBBB on previous EKGs  -- HgA1c 5.6; Lipids (choles 166, HDL 41, LDL 108) --> ASCVD risk 3.6%, statin not indicated  -- Carotid dopplers without signficant stenosis (1-39% bilaterally)  -- PT/OT do not recommend follow-up   Systolic CHF 2/2 to NICM: Clinically euvolemic on admission, at dry weight of 159lbs. Most recent echo (07/2013) showed EF 25%, diffuse hypokinesis, severely dilated cavity size, elevated ventricular end-diastolic filling and left artrial filling pressure, and mild-moderate mitral regurgitation. Subsequent catheterization did not show evidence of CAD, thus systolic CHF felt to be secondary to NICM. Unclear etiology.  -- Continue home Coreg 3.125 BID  -- Holding home Lasix (20mg  prn)  -- Started on lisinopril 2.5mg   #Hypotension: Appears to run in the 100s-110s/50-60s. Had intermittent hypotension during last hospitalization and therefore was started on prn Lasix (rather than daily). Was not started on ACE/ARB during last hospitalization due to hypotension. Unclear why initially started on an ACE rather than a beta-blocker.  -- Continue to monitor  -- Started on lisinopril 2.5mg   FEN/GI: Regular diet, KVO  Prophylaxis: Lovenox   Disposition: discharge today pending ECHO   Subjective: Reports her speech and word finding has improved. No weakness or numbness. No SOB, CP, palpitations. No difficulty with gait. She reports minimal SOB with exertion at home.   Objective: Temp:  [97.7 F (36.5 C)-98.6 F (37 C)] 97.9 F (36.6 C) (09/10 0548) Pulse Rate:  [62-80] 62 (09/10 0548) Resp:  [16-20] 18 (09/10 0548) BP: (102-118)/(46-76)  118/76 mmHg (09/10 0548) SpO2:  [96 %-100 %] 98 % (09/10  0548)  Physical Exam: General: Well-appearing woman in no acute distress.  HEENT: PERRLA. EOMI. MMM. NT/Holden.  Cardiovascular: RRR no m/r/g. S3 present. No carotid bruits. No JVD.  Respiratory: CTAB. No wheezes or crackles. Normal WOB.  Abdomen: +bs, non-distended, non-tender.  Extremities: Trace pedal edema in bilaterally. Skin: No lesions present.  Neuro: CN II-XII intact. Right facial droop without forehead involvement, improving. 5/5 strength throughout, with exception of 4/5 right grip strength. Dysarthria improved since yesterday. Difficulty with word finding though can pronounce words and understand others, improved since yesterday.   Laboratory:  Recent Labs Lab 09/23/13 1029  WBC 7.2  HGB 12.3  HCT 36.6  PLT 221    Recent Labs Lab 09/23/13 1029  NA 139  K 5.1  CL 106  CO2 21  BUN 15  CREATININE 0.68  CALCIUM 9.3  PROT 7.1  BILITOT 0.5  ALKPHOS 93  ALT 15  AST 31  GLUCOSE 73   HgA1c 5.6  Lipids (cholesterol 166, triglyercides 83, HDL 41, LDL 108)   Imaging/Diagnostic Tests:  Ct Head Wo Contrast 09/23/2013  IMPRESSION: No acute intracranial abnormality.  Mr Brain Wo Contrast 09/23/2013  1. Acute ischemic right parietal lobe infarct involving the  posterior right MCA territory. No associated hemorrhage or  significant mass effect.  2. Atrophy with mild chronic small vessel ischemic changes involving  the supratentorial white matter.   Mr Maxine Glenn Head/brain Wo Contrast 09/23/2013  Unremarkable MRA of the brain with no hemodynamically significant stenosis, proximal branch occlusion, or other abnormality identified.   Carotid dopplers: 09/24/2013  Summary: - The vertebral arteries appear patent with antegrade flow. - Findings consistent with 1-39 percent stenosis involving the right internal carotid artery and the left internal carotid artery.  [name], Med Student 09/25/2013, 7:20 AM Acting Intern, [facility] FPTS Intern pager:  [phone], text pages welcome  Upper Level Addendum:  I have seen and evaluated this patient along with MS [name] and reviewed the above note, making necessary revisions in [facility].   General: Well-appearing woman in no acute distress.   Cardiovascular: RRR no m/r/g.   Respiratory: CTAB. No wheezes or crackles. Normal WOB.  Abdomen: +bs, non-distended, non-tender.  Extremities: Trace pedal edema in bilaterally. Skin: No lesions present.  Neuro:  5/5 strength throughout, with exception of 4/5 right grip strength. Dysarthria improved since yesterday. Difficulty with word finding though can pronounce words and understand others, improved since yesterday.  Clare Gandy, MD Family Medicine PGY-2

## 2013-09-25 NOTE — Progress Notes (Signed)
  Echocardiogram 2D Echocardiogram has been performed.  [name] FRANCES 09/25/2013, 10:35 AM

## 2013-09-25 NOTE — Progress Notes (Signed)
Physical Therapy Treatment Patient Details Name: [name] MRN: [id number] DOB: October 04, 1950 Today's Date: 09/25/2013    History of Present Illness [name] is a 63 y.o. Female admitted 09/23/13 with c/o Expressive aphasia and dysarthria. MRI showed Acute ischemic right parietal lobe infarct involving the posterior right MCA territory.     PT Comments    Pt with some notable balance difficulties today; primarily with high level activities and when distracted. Recommend use of cane upon D/C home and OPPT for neuro rehab. No further acute needs warranted.   Follow Up Recommendations  Outpatient PT;Supervision/Assistance - 24 hour     Equipment Recommendations       Recommendations for Other Services       Precautions / Restrictions Precautions Precautions: Fall Restrictions Weight Bearing Restrictions: No    Mobility  Bed Mobility Overal bed mobility: Modified Independent             General bed mobility comments: pt without dificulty with bed mobility   Transfers Overall transfer level: Needs assistance Equipment used: None Transfers: Sit to/from Stand Sit to Stand: Supervision         General transfer comment: supervision for safety; slight sway  Ambulation/Gait Ambulation/Gait assistance: Supervision;Min guard Ambulation Distance (Feet): 500 Feet Assistive device: None Gait Pattern/deviations: Step-through pattern;Shuffle;Decreased stride length;Staggering left;Staggering right;Scissoring Gait velocity: decreased Gait velocity interpretation: Below normal speed for age/gender General Gait Details: pt with multiple staggered steps and scissor steps which required min guard to steady; encouraged use of cane upon returning home and OPPT   Stairs Stairs: Yes Stairs assistance: Min guard Stair Management: One rail Right;Alternating pattern;Forwards Number of Stairs: 3    Wheelchair Mobility    Modified Rankin (Stroke Patients  Only) Modified Rankin (Stroke Patients Only) Pre-Morbid Rankin Score: No significant disability Modified Rankin: Slight disability     Balance Overall balance assessment: Needs assistance                           High level balance activites: Head turns;Side stepping;Turns;Sudden stops;Direction changes High Level Balance Comments: pt challenged with high level balance activities ^ and also to pick objects up off ground, pt with staggered steps and scissor gt at times; min guard required    Cognition Arousal/Alertness: Awake/alert Behavior During Therapy: WFL for tasks assessed/performed Overall Cognitive Status: Within Functional Limits for tasks assessed                      Exercises      General Comments General comments (skin integrity, edema, etc.): educated pt and husband on D/C recommendations for OPPT and use of cane for balacne       Pertinent Vitals/Pain Pain Assessment: No/denies pain    Home Living                      Prior Function            PT Goals (current goals can now be found in the care plan section) Acute Rehab PT Goals Patient Stated Goal: home today PT Goal Formulation: With patient Time For Goal Achievement: 10/01/13 Potential to Achieve Goals: Good Progress towards PT goals: Progressing toward goals    Frequency  Min 3X/week    PT Plan Discharge plan needs to be updated    Co-evaluation             End of Session Equipment Utilized During Treatment: Gait  belt Activity Tolerance: Patient tolerated treatment well Patient left: in bed;with call bell/phone within reach;with nursing/sitter in room;with family/visitor present     Time: 3086-5784 PT Time Calculation (min): 15 min  Charges:  $Gait Training: 8-22 mins                    G Codes:      [name], Beecher  [phone] 09/25/2013, 5:17 PM

## 2013-09-25 NOTE — Progress Notes (Signed)
Stroke Team Progress Note  HISTORY [name] is an 63 y.o. female with known CHF who was taking ASA daily. Patient awoke on Tuesday not feeling well. Her husband noted a right facial droop and slurred speech. He was concerned for stroke and called 9-1-1. Patient was not a tPA candidate due to not knowing her last known well. Initial CT scan of head was normal but follow up MRI showed a right brain CVA. Currently patients main complaint is slurred speech.  Denies HA, vertigo, double vision, difficulty swallowing, arm or leg weakness, confusion, or visual disturbances.  Date last known well: Date: 09/23/2013  Time last known well: Unable to determine  tPA Given: No: out of window    . She was admitted to the neuro floor bed for further evaluation and treatment.  SUBJECTIVE Her  family is at the bedside.  Overall she feels her condition is improving She has no left body symptoms but MRI shows acute right frontal mca branch embolic infarct and interestingly no acute left brain infarct to explain the right face weakness.  OBJECTIVE Most recent Vital Signs: Filed Vitals:   09/25/13 0220 09/25/13 0548 09/25/13 0942 09/25/13 1410  BP: 109/55 118/76 104/60 116/69  Pulse: 64 62 72 72  Temp: 98.1 F (36.7 C) 97.9 F (36.6 C) 97.8 F (36.6 C) 97.8 F (36.6 C)  TempSrc: Oral Oral Oral Oral  Resp: SpO2: 99% 98% 98% 99%   CBG (last 3)  No results found for this basename: GLUCAP,  in the last 72 hours  IV Fluid Intake:     MEDICATIONS  .  stroke: mapping our early stages of recovery book   Does not apply Once  . atorvastatin  40 mg Oral q1800  . carvedilol  3.125 mg Oral BID WC  . clopidogrel  75 mg Oral Daily  . enoxaparin (LOVENOX) injection  40 mg Subcutaneous Q24H  . lisinopril  2.5 mg Oral Daily  . polyethylene glycol  17 g Oral Daily  . senna-docusate  1 tablet Oral Daily  . sodium chloride  3 mL Intravenous Q12H   PRN:  sodium chloride, acetaminophen,  acetaminophen, sodium chloride  Diet:  Cardiac   Activity:  Bedrest  DVT Prophylaxis:  scds CLINICALLY SIGNIFICANT STUDIES Basic Metabolic Panel:  Recent Labs Lab 09/23/13 1029  NA 139  K 5.1  CL 106  CO2 21  GLUCOSE 73  BUN 15  CREATININE 0.68  CALCIUM 9.3   Liver Function Tests:  Recent Labs Lab 09/23/13 1029  AST 31  ALT 15  ALKPHOS 93  BILITOT 0.5  PROT 7.1  ALBUMIN 3.4*   CBC:  Recent Labs Lab 09/23/13 1029  WBC 7.2  NEUTROABS 4.2  HGB 12.3  HCT 36.6  MCV 87.1  PLT 221   Coagulation:  Recent Labs Lab 09/23/13 1029  LABPROT 13.4  INR 1.02   Cardiac Enzymes: No results found for this basename: CKTOTAL, CKMB, CKMBINDEX, TROPONINI,  in the last 168 hours Urinalysis: No results found for this basename: COLORURINE, APPERANCEUR, LABSPEC, PHURINE, GLUCOSEU, HGBUR, BILIRUBINUR, KETONESUR, PROTEINUR, UROBILINOGEN, NITRITE, LEUKOCYTESUR,  in the last 168 hours Lipid Panel    Component Value Date/Time   CHOL 166 09/23/2013 1503   TRIG 83 09/23/2013 1503   HDL 41 09/23/2013 1503   CHOLHDL 4.0 09/23/2013 1503   VLDL 17 09/23/2013 1503   LDLCALC 108* 09/23/2013 1503   HgbA1C  Lab Results  Component Value Date   HGBA1C 5.6  09/23/2013    Urine Drug Screen:   No results found for this basename: labopia, cocainscrnur, labbenz, amphetmu, thcu, labbarb    Alcohol Level: No results found for this basename: ETH,  in the last 168 hours  Mr Brain Wo Contrast  09/23/2013   CLINICAL DATA:  Right-sided facial droop, dysarthria, expressive aphasia. Concern for TIA versus stroke.  EXAM: MRI HEAD WITHOUT CONTRAST  MRA HEAD WITHOUT CONTRAST  TECHNIQUE: Multiplanar, multiecho pulse sequences of the brain and surrounding structures were obtained without intravenous contrast. Angiographic images of the head were obtained using MRA technique without contrast.  COMPARISON:  Prior CT from earlier the same day.  FINDINGS: MRI HEAD FINDINGS  There is a new wedge-shaped area of restricted  diffusion involving the cortical gray matter and subcortical white matter of the posterior right MCA territory in the right parietal lobe (series 4, image 20). Finding is consistent with acute ischemic infarct. Overall size of the infarct measures 1.8 x 3.5 cm. No associated intracranial hemorrhage. Minimal gyral edema seen on T2/FLAIR weighted sequence within this region. No significant mass effect.  Mild generalized atrophy present. Additional scattered patchy T2/FLAIR hyperintensity seen within the deep white matter both cerebral hemispheres noted, likely related to mild chronic small vessel ischemic disease.  No mass lesion or midline shift. Ventricles are normal in size without evidence of hydrocephalus. No extra-axial fluid collection.  Craniocervical junction is normal. Pituitary gland within normal limits. No acute abnormality seen about either orbit.  Calvarium and scalp soft tissues within normal limits.  Paranasal sinuses and mastoid air cells are clear.  MRA HEAD FINDINGS  ANTERIOR CIRCULATION:  Visualized distal cervical segments of the internal carotid arteries are widely patent with antegrade flow. Petrous and cavernous segments are widely patent. A1 segments are symmetric and well opacified. Anterior communicating artery within normal limits. Anterior cerebral arteries well opacified.  Right right MCA bifurcation is normal. The distal right MCA branches are grossly opacified, with no proximal branch occlusion.  Left M1 segment, left MCA bifurcation, and distal left MCA branches are within normal limits. M1 segment is well opacified without evidence of proximal branch occlusion or hemodynamically significant stenosis.  POSTERIOR CIRCULATION:  The left vertebral artery is dominant. Posterior inferior cerebral arteries within normal limits. Vertebrobasilar junction and basilar artery are widely patent with antegrade flow. Posterior cerebral arteries and superior cerebellar arteries widely patent. No  hemodynamically significant stenosis or proximal branch occlusion seen within the posterior circulation.  No intracranial aneurysm.  IMPRESSION: MRI HEAD IMPRESSION:  1. Acute ischemic right parietal lobe infarct involving the posterior right MCA territory. No associated hemorrhage or significant mass effect. 2. Atrophy with mild chronic small vessel ischemic changes involving the supratentorial white matter.  MRA HEAD IMPRESSION:  Unremarkable MRA of the brain with no hemodynamically significant stenosis, proximal branch occlusion, or other abnormality identified.   Electronically Signed   By: Rise Mu M.D.   On: 09/23/2013 22:39   Mr Maxine Glenn Head/brain Wo Cm  09/23/2013   CLINICAL DATA:  Right-sided facial droop, dysarthria, expressive aphasia. Concern for TIA versus stroke.  EXAM: MRI HEAD WITHOUT CONTRAST  MRA HEAD WITHOUT CONTRAST  TECHNIQUE: Multiplanar, multiecho pulse sequences of the brain and surrounding structures were obtained without intravenous contrast. Angiographic images of the head were obtained using MRA technique without contrast.  COMPARISON:  Prior CT from earlier the same day.  FINDINGS: MRI HEAD FINDINGS  There is a new wedge-shaped area of restricted diffusion involving the cortical gray matter and  subcortical white matter of the posterior right MCA territory in the right parietal lobe (series 4, image 20). Finding is consistent with acute ischemic infarct. Overall size of the infarct measures 1.8 x 3.5 cm. No associated intracranial hemorrhage. Minimal gyral edema seen on T2/FLAIR weighted sequence within this region. No significant mass effect.  Mild generalized atrophy present. Additional scattered patchy T2/FLAIR hyperintensity seen within the deep white matter both cerebral hemispheres noted, likely related to mild chronic small vessel ischemic disease.  No mass lesion or midline shift. Ventricles are normal in size without evidence of hydrocephalus. No extra-axial fluid  collection.  Craniocervical junction is normal. Pituitary gland within normal limits. No acute abnormality seen about either orbit.  Calvarium and scalp soft tissues within normal limits.  Paranasal sinuses and mastoid air cells are clear.  MRA HEAD FINDINGS  ANTERIOR CIRCULATION:  Visualized distal cervical segments of the internal carotid arteries are widely patent with antegrade flow. Petrous and cavernous segments are widely patent. A1 segments are symmetric and well opacified. Anterior communicating artery within normal limits. Anterior cerebral arteries well opacified.  Right right MCA bifurcation is normal. The distal right MCA branches are grossly opacified, with no proximal branch occlusion.  Left M1 segment, left MCA bifurcation, and distal left MCA branches are within normal limits. M1 segment is well opacified without evidence of proximal branch occlusion or hemodynamically significant stenosis.  POSTERIOR CIRCULATION:  The left vertebral artery is dominant. Posterior inferior cerebral arteries within normal limits. Vertebrobasilar junction and basilar artery are widely patent with antegrade flow. Posterior cerebral arteries and superior cerebellar arteries widely patent. No hemodynamically significant stenosis or proximal branch occlusion seen within the posterior circulation.  No intracranial aneurysm.  IMPRESSION: MRI HEAD IMPRESSION:  1. Acute ischemic right parietal lobe infarct involving the posterior right MCA territory. No associated hemorrhage or significant mass effect. 2. Atrophy with mild chronic small vessel ischemic changes involving the supratentorial white matter.  MRA HEAD IMPRESSION:  Unremarkable MRA of the brain with no hemodynamically significant stenosis, proximal branch occlusion, or other abnormality identified.   Electronically Signed   By: Rise Mu M.D.   On: 09/23/2013 22:39       MRI of the brain  As above  MRA of the brain  As above  Carotid Doppler   pending  2D Echocardiogram  EF 15 % reduced from 25 % in July 2015  CXR  pending  EKG  done.   Therapy Recommendations pending Physical Exam   Pleasant elderly Caucasian lady not in distress.Awake alert. Afebrile. Head is nontraumatic. Neck is supple without bruit. Hearing is normal. Cardiac exam no murmur or gallop. Lungs are clear to auscultation. Distal pulses are well felt. Neurological Exam : Awake alert oriented x 3 normal speech and language. Mild right lower face asymmetry. Tongue midline. No drift. Mild diminished fine finger movements on right. Orbits leftt over right upper extremity. Mild right grip weak.. Normal sensation . Normal coordination. ASSESSMENT [name] is a 63 y.o. female presenting with slurred speech and right face weakness.  MRI shows embolic right frontal MCA branch infarct but surprisingly no left brain infarct.Suspect cardiogenic embolism from cardiomyopathy with low EF but no definite clot noted on echo. On Aspirin 325 mg prior to admission. Now on Plavix for secondary stroke prevention. Patient with resultant  Mild right face weakness Stroke work up underway.   Congestive heartFailure  with low EF  LDL 108  HbA1c 5.6%   Hospital day # 2  TREATMENT/PLAN  Continue  Plavix  for secondary stroke prevention.  Consult cardiology to discuss anticoagulation need. If they agree no need for further tests else consider TEE & loop recorder implant  Statin for elevated LDL  Check Dopplers  Stroke team will follow I have personally examined this patient, reviewed notes, independently viewed imaging studies, participated in medical decision making and plan of care. I have made any additions or clarifications directly to the above note. Agree with note above.    Delia Heady, MD Medical Director [facility] Pager: [phone] 09/25/2013 5:06 PM          To contact Stroke Continuity provider, please refer to [email or web address]. After  hours, contact General Neurology To contact Stroke Continuity provider, please refer to [email or web address]. After hours, contact General Neurology

## 2013-09-25 NOTE — Progress Notes (Signed)
FMTS Attending Daily Note:  Renold Don MD  [phone] pager  Family Practice pager:  [phone] I have seen and examined this patient and have reviewed their chart. I have discussed this patient with the resident. I agree with the resident's findings, assessment and care plan.  Additionally:  - Patient up walking around hallway with PT on my exam.  Feels better, looks like she is doing much better overall.  Dysarthria still present, but improved and only mild currently.  - Unfortunately Echo today revealed severe reduction in EF and diffuse hypokinesis.  No thrombus. - Stroke area is mainly MCA, doesn't necessarily correlate with watershed area between PCA/MCA, though it is posterior section of MCA.  Hypotension plus decreased cardiac output as cause of stroke? - Will touch base with Heart Failure team today due to her persistent hypotension and worsening of heart failure.   - Await neuro input with this new information.    Tobey Grim, MD 09/25/2013 12:21 PM

## 2013-09-25 NOTE — Consult Note (Signed)
 Cardiology Consultation Note  Patient ID: [name], MRN: [id number], DOB/AGE: 03/11/1950 63 y.o. Admit date: 09/23/2013   Date of Consult: 09/25/2013 Primary Physician: ADVANI, DEEPAK, MD Primary Cardiologist: Dr. Skains  Chief Complaint: slurred speech Reason for Consult: stroke, ? coumadin  HPI: [name] is a 63 y.o. female with recently diagnosed systolic CHF in 07/2013 (EF 25%) due to NICM (no significant CAD), LBBB, mild-mod MR who presented to [facility] 09/23/2013 with a stroke.   She is memorable in that she and her husband do not have a car so she walks quite a bit. Her husband is an EMT. On her initial admission in 07/2013, she had presented to [facility] with SOB and hypoxia and was admitted for CHF and treated with IV diuresis. Troponin bumped to 0.65 felt due to demand ischemia as cath showed no angiographic evidence of CAD. Low dose Coreg was added. Her hypotension prohibited further med titration so she was not sent home on ACEI. She was given low dose PRN Lasix to use in case of weight gain. When I saw her back in clinic on 08/18/13 she was doing well and we were able to add lisinopril 2.5mg daily. The patient did not take this because of fears of becoming hypotensive.  She presented this admission 09/23/13 with right-sided facial droop, dysarthria, and expressive aphasia. On Monday 09/22/13 she had a headache which was unusual along with some word finding difficulty. There was also some left upper extremity weakness. After a nap, she woke up without deficits for the rest of the evening. This following morning on 09/23/13, she awoke feeling extremely weak "as a kitten" and with slurred speech, word finding, veering to the left while walking, and left upper extremity weakness. She came to the ED where MRI of the head showed acute ischemic right parietal lobe infarct involving the posterior right MCA territory without hemorrhage or mass effect; unremarkable MRI and carotid duplex. Her  symptoms have gradually improved. She does still have mild word-finding difficulty. Her aspirin has been changed to Plavix due to stroke. Prior to this event, she had been doing well - she and her husband walk 45 minutes per day and she had not had any recent problems with this. She sometimes gets chest discomfort if she bends over, but no exertional chest pain. She occasionally feels her heart pound "harder" at night but not fast or irregular. She denies, weight changes, LEE, orthopnea, bleeding, syncope. She only has to take Lasix very rarely and last did so on Monday. Labs grossly unremarkable including CMET, CBC; troponin neg x1, A1C 5.6, LDL 108.  Low dose ACEI was ordered but not given due to hypotension. She has been normotensive this admission with softest BP 94/55. 2D echo shows worsening of EF to 15%, diffuse HK, grade 1 diastolic dysfunction, high ventricular filling pressures, trivial AI, moderate MR.   Past Medical History  Diagnosis Date  . GERD (gastroesophageal reflux disease)   . Arthritis   . PONV (postoperative nausea and vomiting)   . Systolic CHF     a. Dx 07/2013 - due to NICM. Cath normal cors. EF 25%.  . LBBB (left bundle branch block)   . Hypotension   . Mitral regurgitation     a. Mild-mod MR by echo 07/2013.      Most Recent Cardiac Studies: 2D Echo 09/25/13 - Left ventricle: The cavity size was normal. Wall thickness was normal. The estimated ejection fraction was 15%. Diffuse hypokinesis. Doppler parameters are   consistent with abnormal left ventricular relaxation (grade 1 diastolic dysfunction). Doppler parameters are consistent with high ventricular filling pressure. - Aortic valve: There was trivial regurgitation. - Mitral valve: There was moderate regurgitation. - Left atrium: The atrium was mildly dilated. Impressions: - Severe global reduction in LV function; grade 1 diastolic dysfunction and elevated left ventricular filling pressures, mild LAE; moderate MR.  Compared to 08/07/13, LV function remains severely reduced.   2D echo 07/2013 - Left ventricle: Diffuse hypokinesis with abnormal septal motion. The cavity size was severely dilated. Wall thickness was increased in a pattern of mild LVH. The estimated ejection fraction was 25%. Doppler parameters are consistent with both elevated ventricular end-diastolic filling pressure and elevated left atrial filling pressure. - Aortic valve: There was trivial regurgitation. - Mitral valve: There was mild to moderate regurgitation. - Left atrium: The atrium was mildly dilated.  Carotid duplex 09/24/13  1-39% bilateral stenosis involving the R/LICA  Cath 08/08/13 Cardiac Catheterization Operative Report  [name]  [id number]  7/24/201511:52 AM  ADVANI, DEEPAK, MD  Procedure Performed:  1. Left Heart Catheterization 2. Selective Coronary Angiography 3. Right Heart Catheterization Operator: Christopher McAlhany, MD  Acess: Right radial artery, right antecubital vein  Indication: 62 yo female with no history of cardiac disease admitted with CHF. LVEF=25% by echo. Cath to assess filling pressures and exclude CAD.  Procedure Details:  The risks, benefits, complications, treatment options, and expected outcomes were discussed with the patient. The patient and/or family concurred with the proposed plan, giving informed consent. The patient was brought to the cath lab after IV hydration was begun and oral premedication was given. The patient was further sedated with Versed. The right arm was prepped in the antecubital area and the radial area. An antecubital IV was in place in the right antecubital vein. I changed this to a 5 French sheath over a wire. I then used a balloon tipped catheter to perform right heart catheterization. I then turned my attention to the right radial artery. 1% lidocaine used for local anesthesia. I then inserted a 5 French sheath in the right radial artery. 3 mg Verapamil given  through the sheath. 4000 units IV heparin was given. Standard diagnostic catheters were used to perform selective coronary angiography. A pigtail catheter was used to measure LV pressures. A Terumo hemostasis band was applied at the arteriotomy site. There were no immediate complications. The patient was taken to the recovery area in stable condition.  Hemodynamic Findings:  Ao: 106/69  LV: 103/10/0  RA: 4  RV: 24/1/5  PA: 23/8 (mean 12)  PCWP: 11  Fick Cardiac Output: 4.61 L/min  Fick Cardiac Index:2.52 L/min/m2  Central Aortic Saturation: 97%  Pulmonary Artery Saturation: 68%  Angiographic Findings:  Left main: No obstructive disease.  Left Anterior Descending Artery: Large caliber vessel that courses to the apex. There is a moderate caliber diagonal branch. No obstructive disease.  Circumflex Artery: Large caliber vessel with large caliber obtuse marginal branch. No obstructive disease.  Right Coronary Artery: Large dominant vessel with no obstructive disease.  Left Ventricular Angiogram: Deferred  Impression:  1. No angiographic evidence of CAD  2. Normal filling pressures.  3. Non-ischemic cardiomyopathy  Recommendations: Medical management. She appears to be adequately diuresed. Will changed Lasix to 40 mg po BID. Will add low dose Coreg 3.125 mg po BID. Will consider starting Lisinopril 2.5 mg po Qdaily in am if BP tolerates. Transfer to telemetry unit.  Complications: None; patient tolerated the procedure well.       Surgical History:  Past Surgical History  Procedure Laterality Date  . Tonsillectomy    . Knee arthroscopy      rt  . Wrist arthroscopy  12/21/2010    Procedure: ARTHROSCOPY WRIST;  Surgeon: Gary R Kuzma, MD;  Location: [facility];  Service: Orthopedics;  Laterality: Right;  right wrist, repair TFCC on right, open reconstruction ECU sheath     Home Meds: Prior to Admission medications   Medication Sig Start Date End Date Taking? Authorizing  Provider  aspirin EC 81 MG EC tablet Take 1 tablet (81 mg total) by mouth daily. 08/11/13  Yes Dayna N Dunn, PA-C  carvedilol (COREG) 3.125 MG tablet Take 1 tablet (3.125 mg total) by mouth 2 (two) times daily with a meal. 08/11/13  Yes Dayna N Dunn, PA-C  furosemide (LASIX) 20 MG tablet Take 1 tablet (20 mg) by mouth once daily as needed for fluid weight gain (more than 3 lbs from "dry weight"). 08/11/13  Yes Dayna N Dunn, PA-C    Inpatient Medications:  .  stroke: mapping our early stages of recovery book   Does not apply Once  . atorvastatin  40 mg Oral q1800  . carvedilol  3.125 mg Oral BID WC  . clopidogrel  75 mg Oral Daily  . enoxaparin (LOVENOX) injection  40 mg Subcutaneous Q24H  . lisinopril  2.5 mg Oral Daily  . polyethylene glycol  17 g Oral Daily  . senna-docusate  1 tablet Oral Daily  . sodium chloride  3 mL Intravenous Q12H      Allergies:  Allergies  Allergen Reactions  . Onion Other (See Comments)    gas  . Tomato Other (See Comments)    reflux    History   Social History  . Marital Status: Married    Spouse Name: N/A    Number of Children: N/A  . Years of Education: N/A   Occupational History  . Not on file.   Social History Main Topics  . Smoking status: Never Smoker   . Smokeless tobacco: Not on file  . Alcohol Use: No  . Drug Use: No  . Sexual Activity: Not on file   Other Topics Concern  . 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      Family History  Problem Relation Age of Onset  . Congestive Heart Failure Mother      Review of Systems: All other systems reviewed and are otherwise negative except as noted above.  Labs:  Lab Results  Component Value Date   WBC 7.2 09/23/2013   HGB 12.3 09/23/2013   HCT 36.6 09/23/2013   MCV 87.1 09/23/2013   PLT 221 09/23/2013    Recent Labs Lab 09/23/13 1029  NA 139  K 5.1  CL 106  CO2 21  BUN 15  CREATININE 0.68  CALCIUM 9.3  PROT 7.1  BILITOT 0.5   ALKPHOS 93  ALT 15  AST 31  GLUCOSE 73   Lab Results  Component Value Date   CHOL 166 09/23/2013   HDL 41 09/23/2013   LDLCALC 108* 09/23/2013   TRIG 83 09/23/2013    Radiology/Studies:  Ct Head Wo Contrast  09/23/2013   CLINICAL DATA:  Frontal headaches  EXAM: CT HEAD WITHOUT CONTRAST  TECHNIQUE: Contiguous axial images were obtained from the base of the skull through the vertex without intravenous contrast.  COMPARISON:  None.  FINDINGS:   The bony calvarium is intact. The ventricles are of normal size and configuration. No findings to suggest acute hemorrhage, acute infarction or space-occupying mass lesion are noted.  IMPRESSION: No acute intracranial abnormality.   Electronically Signed   By: Mark  Lukens M.D.   On: 09/23/2013 11:19   Mr Brain Wo Contrast  09/23/2013   CLINICAL DATA:  Right-sided facial droop, dysarthria, expressive aphasia. Concern for TIA versus stroke.  EXAM: MRI HEAD WITHOUT CONTRAST  MRA HEAD WITHOUT CONTRAST  TECHNIQUE: Multiplanar, multiecho pulse sequences of the brain and surrounding structures were obtained without intravenous contrast. Angiographic images of the head were obtained using MRA technique without contrast.  COMPARISON:  Prior CT from earlier the same day.  FINDINGS: MRI HEAD FINDINGS  There is a new wedge-shaped area of restricted diffusion involving the cortical gray matter and subcortical white matter of the posterior right MCA territory in the right parietal lobe (series 4, image 20). Finding is consistent with acute ischemic infarct. Overall size of the infarct measures 1.8 x 3.5 cm. No associated intracranial hemorrhage. Minimal gyral edema seen on T2/FLAIR weighted sequence within this region. No significant mass effect.  Mild generalized atrophy present. Additional scattered patchy T2/FLAIR hyperintensity seen within the deep white matter both cerebral hemispheres noted, likely related to mild chronic small vessel ischemic disease.  No mass lesion or  midline shift. Ventricles are normal in size without evidence of hydrocephalus. No extra-axial fluid collection.  Craniocervical junction is normal. Pituitary gland within normal limits. No acute abnormality seen about either orbit.  Calvarium and scalp soft tissues within normal limits.  Paranasal sinuses and mastoid air cells are clear.  MRA HEAD FINDINGS  ANTERIOR CIRCULATION:  Visualized distal cervical segments of the internal carotid arteries are widely patent with antegrade flow. Petrous and cavernous segments are widely patent. A1 segments are symmetric and well opacified. Anterior communicating artery within normal limits. Anterior cerebral arteries well opacified.  Right right MCA bifurcation is normal. The distal right MCA branches are grossly opacified, with no proximal branch occlusion.  Left M1 segment, left MCA bifurcation, and distal left MCA branches are within normal limits. M1 segment is well opacified without evidence of proximal branch occlusion or hemodynamically significant stenosis.  POSTERIOR CIRCULATION:  The left vertebral artery is dominant. Posterior inferior cerebral arteries within normal limits. Vertebrobasilar junction and basilar artery are widely patent with antegrade flow. Posterior cerebral arteries and superior cerebellar arteries widely patent. No hemodynamically significant stenosis or proximal branch occlusion seen within the posterior circulation.  No intracranial aneurysm.  IMPRESSION: MRI HEAD IMPRESSION:  1. Acute ischemic right parietal lobe infarct involving the posterior right MCA territory. No associated hemorrhage or significant mass effect. 2. Atrophy with mild chronic small vessel ischemic changes involving the supratentorial white matter.  MRA HEAD IMPRESSION:  Unremarkable MRA of the brain with no hemodynamically significant stenosis, proximal branch occlusion, or other abnormality identified.   Electronically Signed   By: Benjamin  McClintock M.D.   On:  09/23/2013 22:39   Mr Mra Head/brain Wo Cm  09/23/2013   CLINICAL DATA:  Right-sided facial droop, dysarthria, expressive aphasia. Concern for TIA versus stroke.  EXAM: MRI HEAD WITHOUT CONTRAST  MRA HEAD WITHOUT CONTRAST  TECHNIQUE: Multiplanar, multiecho pulse sequences of the brain and surrounding structures were obtained without intravenous contrast. Angiographic images of the head were obtained using MRA technique without contrast.  COMPARISON:  Prior CT from earlier the same day.  FINDINGS: MRI HEAD FINDINGS  There is a new   wedge-shaped area of restricted diffusion involving the cortical gray matter and subcortical white matter of the posterior right MCA territory in the right parietal lobe (series 4, image 20). Finding is consistent with acute ischemic infarct. Overall size of the infarct measures 1.8 x 3.5 cm. No associated intracranial hemorrhage. Minimal gyral edema seen on T2/FLAIR weighted sequence within this region. No significant mass effect.  Mild generalized atrophy present. Additional scattered patchy T2/FLAIR hyperintensity seen within the deep white matter both cerebral hemispheres noted, likely related to mild chronic small vessel ischemic disease.  No mass lesion or midline shift. Ventricles are normal in size without evidence of hydrocephalus. No extra-axial fluid collection.  Craniocervical junction is normal. Pituitary gland within normal limits. No acute abnormality seen about either orbit.  Calvarium and scalp soft tissues within normal limits.  Paranasal sinuses and mastoid air cells are clear.  MRA HEAD FINDINGS  ANTERIOR CIRCULATION:  Visualized distal cervical segments of the internal carotid arteries are widely patent with antegrade flow. Petrous and cavernous segments are widely patent. A1 segments are symmetric and well opacified. Anterior communicating artery within normal limits. Anterior cerebral arteries well opacified.  Right right MCA bifurcation is normal. The distal right  MCA branches are grossly opacified, with no proximal branch occlusion.  Left M1 segment, left MCA bifurcation, and distal left MCA branches are within normal limits. M1 segment is well opacified without evidence of proximal branch occlusion or hemodynamically significant stenosis.  POSTERIOR CIRCULATION:  The left vertebral artery is dominant. Posterior inferior cerebral arteries within normal limits. Vertebrobasilar junction and basilar artery are widely patent with antegrade flow. Posterior cerebral arteries and superior cerebellar arteries widely patent. No hemodynamically significant stenosis or proximal branch occlusion seen within the posterior circulation.  No intracranial aneurysm.  IMPRESSION: MRI HEAD IMPRESSION:  1. Acute ischemic right parietal lobe infarct involving the posterior right MCA territory. No associated hemorrhage or significant mass effect. 2. Atrophy with mild chronic small vessel ischemic changes involving the supratentorial white matter.  MRA HEAD IMPRESSION:  Unremarkable MRA of the brain with no hemodynamically significant stenosis, proximal branch occlusion, or other abnormality identified.   Electronically Signed   By: Benjamin  McClintock M.D.   On: 09/23/2013 22:39    EKG: NSR 76bpm LBBB  Physical Exam: Blood pressure 116/69, pulse 72, temperature 97.8 F (36.6 C), temperature source Oral, resp. rate 18, SpO2 99.00%. General: Well developed, well nourished, in no acute distress. Head: Normocephalic, atraumatic, sclera non-icteric, no xanthomas, nares are without discharge.  Neck: Negative for carotid bruits. JVD not elevated. Lungs: Clear bilaterally to auscultation without wheezes, rales, or rhonchi. Breathing is unlabored. Heart: RRR with S1 S2. No murmurs, rubs, or gallops appreciated. Abdomen: Soft, non-tender, non-distended with normoactive bowel sounds. No hepatomegaly. No rebound/guarding. No obvious abdominal masses. Msk:  Strength and tone appear normal for  age. Extremities: No clubbing or cyanosis. No edema.  Distal pedal pulses are 2+ and equal bilaterally. Neuro: Alert and oriented X 3. No facial asymmetry. Mild slurred speech. Psych:  Responds to questions appropriately with a normal affect.   Assessment and Plan:   1. Acute ischemic R parietal lobe infarct - will discuss with Dr. Marianne Golightly. Agree that TEE would be helpful to discern if there is a LAA or LV clot. Even if no clot exists we can consider empiric anticoagulation since LV function is so poor. If this is the case would still consider loop to exclude occult AF (because if LV function ever normalizes, we would be   faced with the decision of what to do regarding anticoag). See below for further thoughts. Endoscopy is closed now but I will leave a message for tomorrow's team to help schedule her TEE. I have tentatively written orders.  2. Chronic systolic CHF due to NICM with worsening EF (25%->15%), chronic LBBB, elevated filling pressures on echo - she is clinically stable. As usual, BP prohibits any medication titration or even trial of diuresis. I sent a message to Lindsey/Trish to please ask CHF team to weigh in on her situation tomorrow. We may need to consider CRT +/- D sooner given LBBB/low EF. Will ask nursing to begin weighing the patient and monitoring I/O's.   3. Chronic hypotension - will d/c lisinopril since her BP is soft even without administration, particularly not to drop in the setting of recent stroke.  Signed, Dayna Dunn PA-C 09/25/2013, 4:18 PM  The patient was seen, examined and discussed with Dayna Dunn, PA-C and I agree with the above.   A 62 year old female who was diagnosed with non-ischemic CMP in July 2015 (normal coronaries, CO 4.6 Liters, normal filling pressures on right sided cath) who presented with acute ischemic right parietal stroke (confirmed on MRI). Sine the admission for CHF in July for CHF her treatment options were limited as she has significant  chronic hypotension - and was only on carvedilol 3.125 mg po BID and recently added lisinopril 2.5 mg po daily (currently held for hypotension and stroke). Repeated echo showed normal size left ventricle, LVEF 15%, impaired relaxation, high filling pressures, moderate MR and mildly dilated left atrium. The etiology of her systolic dysfunction is unknown, but based on anatomy might be fairly new. She stays active and based on her report NYHA I - walks for 45 minutes daily. However, with worsening function, increasing filling pressures (no clinical signs of fluid overload) and limited treatment options we will consult advanced HF services for their input on future therapy. She might require rather early BiV ICD as improvement without therapy is unlikely. MRI might be helpful in evaluation of possible myocarditis.   With regards to her stroke we will advise to proceed with TEE to evaluate for LAA, LV cavity thrombus. So far no documented a-fib. With severe LV dysfunction and ischemic stroke I would suggest starting anticoagulation with coumadine.   Blakley Michna H 09/25/2013    

## 2013-09-26 ENCOUNTER — Encounter (HOSPITAL_COMMUNITY)
Admission: EM | Disposition: A | Discharge status: Home / Self Care | Payer: Self-pay | Source: Home / Self Care | Attending: Family Medicine

## 2013-09-26 ENCOUNTER — Encounter (HOSPITAL_COMMUNITY): Payer: Self-pay | Admitting: Gastroenterology

## 2013-09-26 DIAGNOSIS — I959 Hypotension, unspecified: Secondary | ICD-10-CM

## 2013-09-26 DIAGNOSIS — I6340 Cerebral infarction due to embolism of unspecified cerebral artery: Principal | ICD-10-CM

## 2013-09-26 DIAGNOSIS — I447 Left bundle-branch block, unspecified: Secondary | ICD-10-CM

## 2013-09-26 DIAGNOSIS — I428 Other cardiomyopathies: Secondary | ICD-10-CM

## 2013-09-26 DIAGNOSIS — I059 Rheumatic mitral valve disease, unspecified: Secondary | ICD-10-CM

## 2013-09-26 HISTORY — PX: TEE WITHOUT CARDIOVERSION: SHX5443

## 2013-09-26 SURGERY — ECHOCARDIOGRAM, TRANSESOPHAGEAL
Anesthesia: Moderate Sedation

## 2013-09-26 MED ORDER — SODIUM CHLORIDE 0.9 % IV SOLN
INTRAVENOUS | Status: DC
  Administered 2013-09-26: 500 mL via INTRAVENOUS

## 2013-09-26 MED ORDER — ATORVASTATIN CALCIUM 40 MG PO TABS: 40.0000 mg | ORAL_TABLET | Freq: Every day | ORAL | Status: DC

## 2013-09-26 MED ORDER — MIDAZOLAM HCL 5 MG/ML IJ SOLN
INTRAMUSCULAR | Status: AC
  Filled 2013-09-26: qty 1

## 2013-09-26 MED ORDER — CLOPIDOGREL BISULFATE 75 MG PO TABS: 75.0000 mg | ORAL_TABLET | Freq: Every day | ORAL | Status: DC

## 2013-09-26 MED ORDER — CLOPIDOGREL BISULFATE 75 MG PO TABS
75.0000 mg | ORAL_TABLET | Freq: Every day | ORAL | Status: DC
  Administered 2013-09-26: 75 mg via ORAL
  Filled 2013-09-26 (×2): qty 1

## 2013-09-26 MED ORDER — FENTANYL CITRATE 0.05 MG/ML IJ SOLN
INTRAMUSCULAR | Status: AC
  Filled 2013-09-26: qty 2

## 2013-09-26 MED ORDER — BUTAMBEN-TETRACAINE-BENZOCAINE 2-2-14 % EX AERO
INHALATION_SPRAY | CUTANEOUS | Status: DC | PRN
  Administered 2013-09-26 (×2): 1 via TOPICAL

## 2013-09-26 MED ORDER — FENTANYL CITRATE 0.05 MG/ML IJ SOLN
INTRAMUSCULAR | Status: DC | PRN
  Administered 2013-09-26 (×2): 25 ug via INTRAVENOUS

## 2013-09-26 MED ORDER — MIDAZOLAM HCL 10 MG/2ML IJ SOLN
INTRAMUSCULAR | Status: DC | PRN
  Administered 2013-09-26: 1 mg via INTRAVENOUS
  Administered 2013-09-26: 2 mg via INTRAVENOUS

## 2013-09-26 NOTE — CV Procedure (Addendum)
Procedure: TEE  Indication: CVA  Sedation: Versed 3 mg IV, Fentanyl 50 mcg IV  Findings: Please see echo section for full report.  The left ventricle was moderately dilated. There was severe global hypokinesis, EF 15-20%.  No LV thrombus was visualized.  Mild left atrial enlargement with no LAA thrombus. Normal RV size and systolic function.  Normal RA size.  Moderate central mitral regurgitation likely due to annular dilatation.  Normal-appearing aortic valve.  No significant thoracic aortic plaque.  Bubble study was weakly positive with a few bubbles crossing.  Possible small PFO.    Impression:  No LV or LA thrombus noted.  No definite evidence to anticoagulate her in the absence of visualized LV thrombus or known atrial fibrillation, though agree that dilated cardiomyopathy certainly raises risk.  I think that she needs long-term monitoring for atrial fibrillation with a Linq monitor, will talk with EP about placement.  She will need close followup, will arrange in the CHF clinic for next week.  For now, would treat with Plavix as recommended by neurology.   [name] 09/26/2013 2:26 PM  I spoke with Dr. Graciela Husbands, as she is a couple of months post-diagnosis of cardiomyopathy without improvement and has LBBB, he would consider putting in BiV-ICD soon with atrial lead that could monitor for atrial fibrillation.  This would be done after discharge.  He will speak with her today.   [name] 09/26/2013 3:26 PM

## 2013-09-26 NOTE — Discharge Instructions (Signed)
Please follow-up closely with cardiology and neurology. Take medications as prescribed.   Seek care if you become short of breath, gain weight, or tire more easily than normal.    Heart Failure Heart failure is a condition in which the heart has trouble pumping blood. This means your heart does not pump blood efficiently for your body to work well. In some cases of heart failure, fluid may back up into your lungs or you may have swelling (edema) in your lower legs. Heart failure is usually a long-term (chronic) condition. It is important for you to take good care of yourself and follow your health care provider's treatment plan. CAUSES  Some health conditions can cause heart failure. Those health conditions include:  High blood pressure (hypertension). Hypertension causes the heart muscle to work harder than normal. When pressure in the blood vessels is high, the heart needs to pump (contract) with more force in order to circulate blood throughout the body. High blood pressure eventually causes the heart to become stiff and weak.  Coronary artery disease (CAD). CAD is the buildup of cholesterol and fat (plaque) in the arteries of the heart. The blockage in the arteries deprives the heart muscle of oxygen and blood. This can cause chest pain and may lead to a heart attack. High blood pressure can also contribute to CAD.  Heart attack (myocardial infarction). A heart attack occurs when one or more arteries in the heart become blocked. The loss of oxygen damages the muscle tissue of the heart. When this happens, part of the heart muscle dies. The injured tissue does not contract as well and weakens the heart's ability to pump blood.  Abnormal heart valves. When the heart valves do not open and close properly, it can cause heart failure. This makes the heart muscle pump harder to keep the blood flowing.  Heart muscle disease (cardiomyopathy or myocarditis). Heart muscle disease is damage to the heart  muscle from a variety of causes. These can include drug or alcohol abuse, infections, or unknown reasons. These can increase the risk of heart failure.  Lung disease. Lung disease makes the heart work harder because the lungs do not work properly. This can cause a strain on the heart, leading it to fail.  Diabetes. Diabetes increases the risk of heart failure. High blood sugar contributes to high fat (lipid) levels in the blood. Diabetes can also cause slow damage to tiny blood vessels that carry important nutrients to the heart muscle. When the heart does not get enough oxygen and food, it can cause the heart to become weak and stiff. This leads to a heart that does not contract efficiently.  Other conditions can contribute to heart failure. These include abnormal heart rhythms, thyroid problems, and low blood counts (anemia). Certain unhealthy behaviors can increase the risk of heart failure, including:  Being overweight.  Smoking or chewing tobacco.  Eating foods high in fat and cholesterol.  Abusing illicit drugs or alcohol.  Lacking physical activity. SYMPTOMS  Heart failure symptoms may vary and can be hard to detect. Symptoms may include:  Shortness of breath with activity, such as climbing stairs.  Persistent cough.  Swelling of the feet, ankles, legs, or abdomen.  Unexplained weight gain.  Difficulty breathing when lying flat (orthopnea).  Waking from sleep because of the need to sit up and get more air.  Rapid heartbeat.  Fatigue and loss of energy.  Feeling light-headed, dizzy, or close to fainting.  Loss of appetite.  Nausea.  Increased urination during the night (nocturia). DIAGNOSIS  A diagnosis of heart failure is based on your history, symptoms, physical examination, and diagnostic tests. Diagnostic tests for heart failure may include:  Echocardiography.  Electrocardiography.  Chest X-ray.  Blood tests.  Exercise stress test.  Cardiac  angiography.  Radionuclide scans. TREATMENT  Treatment is aimed at managing the symptoms of heart failure. Medicines, behavioral changes, or surgical intervention may be necessary to treat heart failure.  Medicines to help treat heart failure may include:  Angiotensin-converting enzyme (ACE) inhibitors. This type of medicine blocks the effects of a blood protein called angiotensin-converting enzyme. ACE inhibitors relax (dilate) the blood vessels and help lower blood pressure.  Angiotensin receptor blockers (ARBs). This type of medicine blocks the actions of a blood protein called angiotensin. Angiotensin receptor blockers dilate the blood vessels and help lower blood pressure.  Water pills (diuretics). Diuretics cause the kidneys to remove salt and water from the blood. The extra fluid is removed through urination. This loss of extra fluid lowers the volume of blood the heart pumps.  Beta blockers. These prevent the heart from beating too fast and improve heart muscle strength.  Digitalis. This increases the force of the heartbeat.  Healthy behavior changes include:  Obtaining and maintaining a healthy weight.  Stopping smoking or chewing tobacco.  Eating heart-healthy foods.  Limiting or avoiding alcohol.  Stopping illicit drug use.  Physical activity as directed by your health care provider.  Surgical treatment for heart failure may include:  A procedure to open blocked arteries, repair damaged heart valves, or remove damaged heart muscle tissue.  A pacemaker to improve heart muscle function and control certain abnormal heart rhythms.  An internal cardioverter defibrillator to treat certain serious abnormal heart rhythms.  A left ventricular assist device (LVAD) to assist the pumping ability of the heart. HOME CARE INSTRUCTIONS   Take medicines only as directed by your health care provider. Medicines are important in reducing the workload of your heart, slowing the  progression of heart failure, and improving your symptoms.  Do not stop taking your medicine unless directed by your health care provider.  Do not skip any dose of medicine.  Refill your prescriptions before you run out of medicine. Your medicines are needed every day.  Engage in moderate physical activity if directed by your health care provider. Moderate physical activity can benefit some people. The elderly and people with severe heart failure should consult with a health care provider for physical activity recommendations.  Eat heart-healthy foods. Food choices should be free of trans fat and low in saturated fat, cholesterol, and salt (sodium). Healthy choices include fresh or frozen fruits and vegetables, fish, lean meats, legumes, fat-free or low-fat dairy products, and whole grain or high fiber foods. Talk to a dietitian to learn more about heart-healthy foods.  Limit sodium if directed by your health care provider. Sodium restriction may reduce symptoms of heart failure in some people. Talk to a dietitian to learn more about heart-healthy seasonings.  Use healthy cooking methods. Healthy cooking methods include roasting, grilling, broiling, baking, poaching, steaming, or stir-frying. Talk to a dietitian to learn more about healthy cooking methods.  Limit fluids if directed by your health care provider. Fluid restriction may reduce symptoms of heart failure in some people.  Weigh yourself every day. Daily weights are important in the early recognition of excess fluid. You should weigh yourself every morning after you urinate and before you eat breakfast. Wear the same amount of  clothing each time you weigh yourself. Record your daily weight. Provide your health care provider with your weight record.  Monitor and record your blood pressure if directed by your health care provider.  Check your pulse if directed by your health care provider.  Lose weight if directed by your health care  provider. Weight loss may reduce symptoms of heart failure in some people.  Stop smoking or chewing tobacco. Nicotine makes your heart work harder by causing your blood vessels to constrict. Do not use nicotine gum or patches before talking to your health care provider.  Keep all follow-up visits as directed by your health care provider. This is important.  Limit alcohol intake to no more than 1 drink per day for nonpregnant women and 2 drinks per day for men. One drink equals 12 ounces of beer, 5 ounces of wine, or 1 ounces of hard liquor. Drinking more than that is harmful to your heart. Tell your health care provider if you drink alcohol several times a week. Talk with your health care provider about whether alcohol is safe for you. If your heart has already been damaged by alcohol or you have severe heart failure, drinking alcohol should be stopped completely.  Stop illicit drug use.  Stay up-to-date with immunizations. It is especially important to prevent respiratory infections through current pneumococcal and influenza immunizations.  Manage other health conditions such as hypertension, diabetes, thyroid disease, or abnormal heart rhythms as directed by your health care provider.  Learn to manage stress.  Plan rest periods when fatigued.  Learn strategies to manage high temperatures. If the weather is extremely hot:  Avoid vigorous physical activity.  Use air conditioning or fans or seek a cooler location.  Avoid caffeine and alcohol.  Wear loose-fitting, lightweight, and light-colored clothing.  Learn strategies to manage cold temperatures. If the weather is extremely cold:  Avoid vigorous physical activity.  Layer clothes.  Wear mittens or gloves, a hat, and a scarf when going outside.  Avoid alcohol.  Obtain ongoing education and support as needed.  Participate in or seek rehabilitation as needed to maintain or improve independence and quality of life. SEEK MEDICAL  CARE IF:   Your weight increases by 03 lb/1.4 kg in 1 day or 05 lb/2.3 kg in a week.  You have increasing shortness of breath that is unusual for you.  You are unable to participate in your usual physical activities.  You tire easily.  You cough more than normal, especially with physical activity.  You have any or more swelling in areas such as your hands, feet, ankles, or abdomen.  You are unable to sleep because it is hard to breathe.  You feel like your heart is beating fast (palpitations).  You become dizzy or light-headed upon standing up. SEEK IMMEDIATE MEDICAL CARE IF:   You have difficulty breathing.  There is a change in mental status such as decreased alertness or difficulty with concentration.  You have a pain or discomfort in your chest.  You have an episode of fainting (syncope). MAKE SURE YOU:   Understand these instructions.  Will watch your condition.  Will get help right away if you are not doing well or get worse. Document Released: 01/02/2005 Document Revised: 05/19/2013 Document Reviewed: 02/02/2012 [facility] Patient Information 2015 [place], [place]. This information is not intended to replace advice given to you by your health care provider. Make sure you discuss any questions you have with your health care provider.

## 2013-09-26 NOTE — H&P (View-Only) (Signed)
Cardiology Consultation Note  Patient ID: [name], MRN: [id number], DOB/AGE: 05-24-1950 63 y.o. Admit date: 09/23/2013   Date of Consult: 09/25/2013 Primary Physician: [name], MD Primary Cardiologist: Dr. Anne Fu  Chief Complaint: slurred speech Reason for Consult: stroke, ? coumadin  HPI: [name] is a 63 y.o. female with recently diagnosed systolic CHF in 07/2013 (EF 25%) due to NICM (no significant CAD), LBBB, mild-mod MR who presented to [facility] 09/23/2013 with a stroke.   She is memorable in that she and her husband do not have a car so she walks quite a bit. Her husband is an EMT. On her initial admission in 07/2013, she had presented to [facility] with SOB and hypoxia and was admitted for CHF and treated with IV diuresis. Troponin bumped to 0.65 felt due to demand ischemia as cath showed no angiographic evidence of CAD. Low dose Coreg was added. Her hypotension prohibited further med titration so she was not sent home on ACEI. She was given low dose PRN Lasix to use in case of weight gain. When I saw her back in clinic on 08/18/13 she was doing well and we were able to add lisinopril 2.5mg  daily. The patient did not take this because of fears of becoming hypotensive.  She presented this admission 09/23/13 with right-sided facial droop, dysarthria, and expressive aphasia. On Monday 09/22/13 she had a headache which was unusual along with some word finding difficulty. There was also some left upper extremity weakness. After a nap, she woke up without deficits for the rest of the evening. This following morning on 09/23/13, she awoke feeling extremely weak "as a kitten" and with slurred speech, word finding, veering to the left while walking, and left upper extremity weakness. She came to the ED where MRI of the head showed acute ischemic right parietal lobe infarct involving the posterior right MCA territory without hemorrhage or mass effect; unremarkable MRI and carotid duplex. Her  symptoms have gradually improved. She does still have mild word-finding difficulty. Her aspirin has been changed to Plavix due to stroke. Prior to this event, she had been doing well - she and her husband walk 45 minutes per day and she had not had any recent problems with this. She sometimes gets chest discomfort if she bends over, but no exertional chest pain. She occasionally feels her heart pound "harder" at night but not fast or irregular. She denies, weight changes, LEE, orthopnea, bleeding, syncope. She only has to take Lasix very rarely and last did so on Monday. Labs grossly unremarkable including CMET, CBC; troponin neg x1, A1C 5.6, LDL 108.  Low dose ACEI was ordered but not given due to hypotension. She has been normotensive this admission with softest BP 94/55. 2D echo shows worsening of EF to 15%, diffuse HK, grade 1 diastolic dysfunction, high ventricular filling pressures, trivial AI, moderate MR.   Past Medical History  Diagnosis Date  . GERD (gastroesophageal reflux disease)   . Arthritis   . PONV (postoperative nausea and vomiting)   . Systolic CHF     a. Dx 07/2013 - due to NICM. Cath normal cors. EF 25%.  [name] LBBB (left bundle branch block)   . Hypotension   . Mitral regurgitation     a. Mild-mod MR by echo 07/2013.      Most Recent Cardiac Studies: 2D Echo 09/25/13 - Left ventricle: The cavity size was normal. Wall thickness was normal. The estimated ejection fraction was 15%. Diffuse hypokinesis. Doppler parameters are  consistent with abnormal left ventricular relaxation (grade 1 diastolic dysfunction). Doppler parameters are consistent with high ventricular filling pressure. - Aortic valve: There was trivial regurgitation. - Mitral valve: There was moderate regurgitation. - Left atrium: The atrium was mildly dilated. Impressions: - Severe global reduction in LV function; grade 1 diastolic dysfunction and elevated left ventricular filling pressures, mild LAE; moderate MR.  Compared to 08/07/13, LV function remains severely reduced.   2D echo 07/2013 - Left ventricle: Diffuse hypokinesis with abnormal septal motion. The cavity size was severely dilated. Wall thickness was increased in a pattern of mild LVH. The estimated ejection fraction was 25%. Doppler parameters are consistent with both elevated ventricular end-diastolic filling pressure and elevated left atrial filling pressure. - Aortic valve: There was trivial regurgitation. - Mitral valve: There was mild to moderate regurgitation. - Left atrium: The atrium was mildly dilated.  Carotid duplex 09/24/13  1-39% bilateral stenosis involving the R/LICA  Cath 08/08/13 Cardiac Catheterization Operative Report  [name]  [id number]  7/24/201511:52 AM  [name], MD  Procedure Performed:  1. Left Heart Catheterization 2. Selective Coronary Angiography 3. Right Heart Catheterization Operator: Verne Carrow, MD  Acess: Right radial artery, right antecubital vein  Indication: 63 yo female with no history of cardiac disease admitted with CHF. LVEF=25% by echo. Cath to assess filling pressures and exclude CAD.  Procedure Details:  The risks, benefits, complications, treatment options, and expected outcomes were discussed with the patient. The patient and/or family concurred with the proposed plan, giving informed consent. The patient was brought to the cath lab after IV hydration was begun and oral premedication was given. The patient was further sedated with Versed. The right arm was prepped in the antecubital area and the radial area. An antecubital IV was in place in the right antecubital vein. I changed this to a 5 [place] sheath over a wire. I then used a balloon tipped catheter to perform right heart catheterization. I then turned my attention to the right radial artery. 1% lidocaine used for local anesthesia. I then inserted a 5 French sheath in the right radial artery. 3 mg Verapamil given  through the sheath. 4000 units IV heparin was given. Standard diagnostic catheters were used to perform selective coronary angiography. A pigtail catheter was used to measure LV pressures. A Terumo hemostasis band was applied at the arteriotomy site. There were no immediate complications. The patient was taken to the recovery area in stable condition.  Hemodynamic Findings:  Ao: 106/69  LV: 103/10/0  RA: 4  RV: 24/1/5  PA: 23/8 (mean 12)  PCWP: 11  Fick Cardiac Output: 4.61 L/min  Fick Cardiac Index:2.52 L/min/m2  Central Aortic Saturation: 97%  Pulmonary Artery Saturation: 68%  Angiographic Findings:  Left main: No obstructive disease.  Left Anterior Descending Artery: Large caliber vessel that courses to the apex. There is a moderate caliber diagonal branch. No obstructive disease.  Circumflex Artery: Large caliber vessel with large caliber obtuse marginal branch. No obstructive disease.  Right Coronary Artery: Large dominant vessel with no obstructive disease.  Left Ventricular Angiogram: Deferred  Impression:  1. No angiographic evidence of CAD  2. Normal filling pressures.  3. Non-ischemic cardiomyopathy  Recommendations: Medical management. She appears to be adequately diuresed. Will changed Lasix to 40 mg po BID. Will add low dose Coreg 3.125 mg po BID. Will consider starting Lisinopril 2.5 mg po Qdaily in am if BP tolerates. Transfer to telemetry unit.  Complications: None; patient tolerated the procedure well.  Surgical History:  Past Surgical History  Procedure Laterality Date  . Tonsillectomy    . Knee arthroscopy      rt  . Wrist arthroscopy  12/21/2010    Procedure: ARTHROSCOPY WRIST;  Surgeon: [name], MD;  Location: [facility];  Service: Orthopedics;  Laterality: Right;  right wrist, repair TFCC on right, open reconstruction ECU sheath     Home Meds: Prior to Admission medications   Medication Sig Start Date End Date Taking? Authorizing  Provider  aspirin EC 81 MG EC tablet Take 1 tablet (81 mg total) by mouth daily. 08/11/13  Yes Dayna N Dunn, PA-C  carvedilol (COREG) 3.125 MG tablet Take 1 tablet (3.125 mg total) by mouth 2 (two) times daily with a meal. 08/11/13  Yes Dayna N Dunn, PA-C  furosemide (LASIX) 20 MG tablet Take 1 tablet (20 mg) by mouth once daily as needed for fluid weight gain (more than 3 lbs from "dry weight"). 08/11/13  Yes Laurann Montana, PA-C    Inpatient Medications:  .  stroke: mapping our early stages of recovery book   Does not apply Once  . atorvastatin  40 mg Oral q1800  . carvedilol  3.125 mg Oral BID WC  . clopidogrel  75 mg Oral Daily  . enoxaparin (LOVENOX) injection  40 mg Subcutaneous Q24H  . lisinopril  2.5 mg Oral Daily  . polyethylene glycol  17 g Oral Daily  . senna-docusate  1 tablet Oral Daily  . sodium chloride  3 mL Intravenous Q12H      Allergies:  Allergies  Allergen Reactions  . Onion Other (See Comments)    gas  . Tomato Other (See Comments)    reflux    History   Social History  . Marital Status: Married    Spouse Name: N/A    Number of Children: N/A  . Years of Education: N/A   Occupational History  . Not on file.   Social History Main Topics  . Smoking status: Never Smoker   . Smokeless tobacco: Not on file  . Alcohol Use: No  . Drug Use: No  . Sexual Activity: Not on file   Other Topics Concern  . 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      Family History  Problem Relation Age of Onset  . Congestive Heart Failure Mother      Review of Systems: All other systems reviewed and are otherwise negative except as noted above.  Labs:  Lab Results  Component Value Date   WBC 7.2 09/23/2013   HGB 12.3 09/23/2013   HCT 36.6 09/23/2013   MCV 87.1 09/23/2013   PLT 221 09/23/2013    Recent Labs Lab 09/23/13 1029  NA 139  K 5.1  CL 106  CO2 21  BUN 15  CREATININE 0.68  CALCIUM 9.3  PROT 7.1  BILITOT 0.5   ALKPHOS 93  ALT 15  AST 31  GLUCOSE 73   Lab Results  Component Value Date   CHOL 166 09/23/2013   HDL 41 09/23/2013   LDLCALC 161* 09/23/2013   TRIG 83 09/23/2013    Radiology/Studies:  Ct Head Wo Contrast  09/23/2013   CLINICAL DATA:  Frontal headaches  EXAM: CT HEAD WITHOUT CONTRAST  TECHNIQUE: Contiguous axial images were obtained from the base of the skull through the vertex without intravenous contrast.  COMPARISON:  None.  FINDINGS:  The bony calvarium is intact. The ventricles are of normal size and configuration. No findings to suggest acute hemorrhage, acute infarction or space-occupying mass lesion are noted.  IMPRESSION: No acute intracranial abnormality.   Electronically Signed   By: Alcide Clever M.D.   On: 09/23/2013 11:19   Mr Brain Wo Contrast  09/23/2013   CLINICAL DATA:  Right-sided facial droop, dysarthria, expressive aphasia. Concern for TIA versus stroke.  EXAM: MRI HEAD WITHOUT CONTRAST  MRA HEAD WITHOUT CONTRAST  TECHNIQUE: Multiplanar, multiecho pulse sequences of the brain and surrounding structures were obtained without intravenous contrast. Angiographic images of the head were obtained using MRA technique without contrast.  COMPARISON:  Prior CT from earlier the same day.  FINDINGS: MRI HEAD FINDINGS  There is a new wedge-shaped area of restricted diffusion involving the cortical gray matter and subcortical white matter of the posterior right MCA territory in the right parietal lobe (series 4, image 20). Finding is consistent with acute ischemic infarct. Overall size of the infarct measures 1.8 x 3.5 cm. No associated intracranial hemorrhage. Minimal gyral edema seen on T2/FLAIR weighted sequence within this region. No significant mass effect.  Mild generalized atrophy present. Additional scattered patchy T2/FLAIR hyperintensity seen within the deep white matter both cerebral hemispheres noted, likely related to mild chronic small vessel ischemic disease.  No mass lesion or  midline shift. Ventricles are normal in size without evidence of hydrocephalus. No extra-axial fluid collection.  Craniocervical junction is normal. Pituitary gland within normal limits. No acute abnormality seen about either orbit.  Calvarium and scalp soft tissues within normal limits.  Paranasal sinuses and mastoid air cells are clear.  MRA HEAD FINDINGS  ANTERIOR CIRCULATION:  Visualized distal cervical segments of the internal carotid arteries are widely patent with antegrade flow. Petrous and cavernous segments are widely patent. A1 segments are symmetric and well opacified. Anterior communicating artery within normal limits. Anterior cerebral arteries well opacified.  Right right MCA bifurcation is normal. The distal right MCA branches are grossly opacified, with no proximal branch occlusion.  Left M1 segment, left MCA bifurcation, and distal left MCA branches are within normal limits. M1 segment is well opacified without evidence of proximal branch occlusion or hemodynamically significant stenosis.  POSTERIOR CIRCULATION:  The left vertebral artery is dominant. Posterior inferior cerebral arteries within normal limits. Vertebrobasilar junction and basilar artery are widely patent with antegrade flow. Posterior cerebral arteries and superior cerebellar arteries widely patent. No hemodynamically significant stenosis or proximal branch occlusion seen within the posterior circulation.  No intracranial aneurysm.  IMPRESSION: MRI HEAD IMPRESSION:  1. Acute ischemic right parietal lobe infarct involving the posterior right MCA territory. No associated hemorrhage or significant mass effect. 2. Atrophy with mild chronic small vessel ischemic changes involving the supratentorial white matter.  MRA HEAD IMPRESSION:  Unremarkable MRA of the brain with no hemodynamically significant stenosis, proximal branch occlusion, or other abnormality identified.   Electronically Signed   By: Rise Mu M.D.   On:  09/23/2013 22:39   Mr Maxine Glenn Head/brain Wo Cm  09/23/2013   CLINICAL DATA:  Right-sided facial droop, dysarthria, expressive aphasia. Concern for TIA versus stroke.  EXAM: MRI HEAD WITHOUT CONTRAST  MRA HEAD WITHOUT CONTRAST  TECHNIQUE: Multiplanar, multiecho pulse sequences of the brain and surrounding structures were obtained without intravenous contrast. Angiographic images of the head were obtained using MRA technique without contrast.  COMPARISON:  Prior CT from earlier the same day.  FINDINGS: MRI HEAD FINDINGS  There is a new  wedge-shaped area of restricted diffusion involving the cortical gray matter and subcortical white matter of the posterior right MCA territory in the right parietal lobe (series 4, image 20). Finding is consistent with acute ischemic infarct. Overall size of the infarct measures 1.8 x 3.5 cm. No associated intracranial hemorrhage. Minimal gyral edema seen on T2/FLAIR weighted sequence within this region. No significant mass effect.  Mild generalized atrophy present. Additional scattered patchy T2/FLAIR hyperintensity seen within the deep white matter both cerebral hemispheres noted, likely related to mild chronic small vessel ischemic disease.  No mass lesion or midline shift. Ventricles are normal in size without evidence of hydrocephalus. No extra-axial fluid collection.  Craniocervical junction is normal. Pituitary gland within normal limits. No acute abnormality seen about either orbit.  Calvarium and scalp soft tissues within normal limits.  Paranasal sinuses and mastoid air cells are clear.  MRA HEAD FINDINGS  ANTERIOR CIRCULATION:  Visualized distal cervical segments of the internal carotid arteries are widely patent with antegrade flow. Petrous and cavernous segments are widely patent. A1 segments are symmetric and well opacified. Anterior communicating artery within normal limits. Anterior cerebral arteries well opacified.  Right right MCA bifurcation is normal. The distal right  MCA branches are grossly opacified, with no proximal branch occlusion.  Left M1 segment, left MCA bifurcation, and distal left MCA branches are within normal limits. M1 segment is well opacified without evidence of proximal branch occlusion or hemodynamically significant stenosis.  POSTERIOR CIRCULATION:  The left vertebral artery is dominant. Posterior inferior cerebral arteries within normal limits. Vertebrobasilar junction and basilar artery are widely patent with antegrade flow. Posterior cerebral arteries and superior cerebellar arteries widely patent. No hemodynamically significant stenosis or proximal branch occlusion seen within the posterior circulation.  No intracranial aneurysm.  IMPRESSION: MRI HEAD IMPRESSION:  1. Acute ischemic right parietal lobe infarct involving the posterior right MCA territory. No associated hemorrhage or significant mass effect. 2. Atrophy with mild chronic small vessel ischemic changes involving the supratentorial white matter.  MRA HEAD IMPRESSION:  Unremarkable MRA of the brain with no hemodynamically significant stenosis, proximal branch occlusion, or other abnormality identified.   Electronically Signed   By: Rise Mu M.D.   On: 09/23/2013 22:39    EKG: NSR 76bpm LBBB  Physical Exam: Blood pressure 116/69, pulse 72, temperature 97.8 F (36.6 C), temperature source Oral, resp. rate 18, SpO2 99.00%. General: Well developed, well nourished, in no acute distress. Head: Normocephalic, atraumatic, sclera non-icteric, no xanthomas, nares are without discharge.  Neck: Negative for carotid bruits. JVD not elevated. Lungs: Clear bilaterally to auscultation without wheezes, rales, or rhonchi. Breathing is unlabored. Heart: RRR with S1 S2. No murmurs, rubs, or gallops appreciated. Abdomen: Soft, non-tender, non-distended with normoactive bowel sounds. No hepatomegaly. No rebound/guarding. No obvious abdominal masses. Msk:  Strength and tone appear normal for  age. Extremities: No clubbing or cyanosis. No edema.  Distal pedal pulses are 2+ and equal bilaterally. Neuro: Alert and oriented X 3. No facial asymmetry. Mild slurred speech. Psych:  Responds to questions appropriately with a normal affect.   Assessment and Plan:   1. Acute ischemic R parietal lobe infarct - will discuss with Dr. Delton See. Agree that TEE would be helpful to discern if there is a LAA or LV clot. Even if no clot exists we can consider empiric anticoagulation since LV function is so poor. If this is the case would still consider loop to exclude occult AF (because if LV function ever normalizes, we would be  faced with the decision of what to do regarding anticoag). See below for further thoughts. Endoscopy is closed now but I will leave a message for tomorrow's team to help schedule her TEE. I have tentatively written orders.  2. Chronic systolic CHF due to NICM with worsening EF (25%->15%), chronic LBBB, elevated filling pressures on echo - she is clinically stable. As usual, BP prohibits any medication titration or even trial of diuresis. I sent a message to Lindsey/Trish to please ask CHF team to weigh in on her situation tomorrow. We may need to consider CRT +/- D sooner given LBBB/low EF. Will ask nursing to begin weighing the patient and monitoring I/O's.   3. Chronic hypotension - will d/c lisinopril since her BP is soft even without administration, particularly not to drop in the setting of recent stroke.  Signed, Kriste Basque Dunn PA-C 09/25/2013, 4:18 PM  The patient was seen, examined and discussed with Ronie Spies, PA-C and I agree with the above.   A 63 year old female who was diagnosed with non-ischemic CMP in July 2015 (normal coronaries, CO 4.6 Liters, normal filling pressures on right sided cath) who presented with acute ischemic right parietal stroke (confirmed on MRI). Sine the admission for CHF in July for CHF her treatment options were limited as she has significant  chronic hypotension - and was only on carvedilol 3.125 mg po BID and recently added lisinopril 2.5 mg po daily (currently held for hypotension and stroke). Repeated echo showed normal size left ventricle, LVEF 15%, impaired relaxation, high filling pressures, moderate MR and mildly dilated left atrium. The etiology of her systolic dysfunction is unknown, but based on anatomy might be fairly new. She stays active and based on her report NYHA I - walks for 45 minutes daily. However, with worsening function, increasing filling pressures (no clinical signs of fluid overload) and limited treatment options we will consult advanced HF services for their input on future therapy. She might require rather early BiV ICD as improvement without therapy is unlikely. MRI might be helpful in evaluation of possible myocarditis.   With regards to her stroke we will advise to proceed with TEE to evaluate for LAA, LV cavity thrombus. So far no documented a-fib. With severe LV dysfunction and ischemic stroke I would suggest starting anticoagulation with coumadine.   Lars Masson 09/25/2013

## 2013-09-26 NOTE — Progress Notes (Signed)
Family Medicine Teaching Service Daily Progress Note Intern Pager: [phone]  Patient name: [name] Medical record number: [id number] Date of birth: 06-Aug-1950 Age: 63 y.o. Gender: female  Primary Care Provider: [name], MD Consultants: Neurology, Cardiology Code Status: Full  Pt Overview and Major Events to Date:  9/8: Patient admitted with forehead-sparing right-sided facial droop, dysarthria, expressive aphasia  9/9: MRI showed acute ischemic right parietal infarct, normal carotid dopplers 9/10: Echo shows decreased EF (25% --> 15%), no thrombus   Assessment and Plan:  [name] is a 63 y.o. female with a PMH significant for systolic CHF (EF 28%) and LBBB who presents with forehead-sparing right-sided facial droop, dysarthria, and expressive aphasia with evidence of acute ischemic right parietal infarct on MRI (9/8) and worsening systolic CHF on echo (EF 15%).   Acute ischemic right parietal infarct: Patient presented with forehead-sparing right-sided facial droop, dysarthria, and word finding and was found to have evidence of acute ischemic right parietal infarct on MRI (9/8). Symptoms improving since admission. Etiology of right facial droop unclear, but cannot r/o micro emoblic not visualized on MRI. Initial CT head negative, did not present in time to consider TPA. High-risk for cardioembolic source given CHF with EF of 15% (decreased from 25% in 07/2013) with mitral regurgitation and mild atrial enlargement, which could predispose stasis and afib. No afib on telemetry thus far, though could be paroxysmal. Possibly secondary to systemic hypoperfusion in setting of hypotension and CHF, which could predispose to watershed infarct. Possibly secondary to thrombosis from vascular disease, though patient does not have classic RFs including diabetes, HTN, HLD.  -- Neurology consulted, appreciate recommendations  -- Failed outpatient therapy with ASA 81; d/c ASA and started  Plavix.  Now on Coumadin only (see below). -- MRI/MRA (9/8): Acute ischemic right parietal lobe infarct involving the posterior right MCA territory. No associated hemorrhage or significant mass effect.  -- Echo (9/10): EF 15%, diffuse hypokinesis, mod mitral regurgitation, mildly dilated LA -- Neuro checks q4h  -- Telemetry monitoring  -- EKG with LBBB, no evidence of ischemia; LBBB on previous EKGs  -- HgA1c 5.6; Lipids (choles 166, HDL 41, LDL 108) --> ASCVD risk 3.6%, statin not indicated  -- Carotid dopplers without signficant stenosis (1-39% bilaterally)  -- PT/OT do not recommend follow-up   Worsening systolic CHF (EF 41-->32%) 2/2 to NICM: Clinically euvolemic since admission. Echo in 07/2013 showed EF 25%, diffuse hypokinesis, severely dilated cavity size, elevated ventricular end-diastolic filling and left artrial filling pressure, and mild-moderate mitral regurgitation. Subsequent catheterization did not show evidence of CAD, thus systolic CHF felt to be secondary to NICM. Unclear etiology, though could represent sequelae of myocarditis without presence of risk factors. No history of alcohol use. No evidence of restrictive disease on echo that would point to infiltrative disease.  -- Cardiology consulted, appreciate recommendations  --  Echo (9/10): EF 15%, normal cavity size, diffuse hypokinesis, mod mitral regurgitation, mildly dilated LA -- TEE on 9/11 to assess for LA appendage, LV cavity thrombus -- Neurology and cardiology suggest anti-coagulation with warfarin in setting of decreased EF, possible cardioembolic event -- Will defer cardiac MRI to evaluate for myocarditis as will not alter management  -- Will re-adress loop recorder with cardiology -- Cardiology will f/u about need for early BiV ICD placement  -- Continue home Coreg 3.125 BID; d/c lisinopril to prevent hypotension in setting of recent CVA -- Holding home Lasix (  prn)   #Hypotension: Appears to run in the  100s-110s/50-60s. Had  intermittent hypotension during last hospitalization and therefore was started on prn Lasix (rather than daily). Was not started on ACE/ARB during last hospitalization due to hypotension.  -- Continue to monitor  -- Cardiology recommending d/c lisinopril to prevent hypotension in setting of recent CVA  FEN/GI: Regular diet, KVO  Prophylaxis: Lovenox   Disposition: Telemetry  Subjective: Spirits are down today. Reports that her word finding and dysarthria continue to improve. Denies weakness or tingling. Denies SOB, orthopnea, PND.   Objective: Temp:  [97.8 F (36.6 C)-98.3 F (36.8 C)] 97.8 F (36.6 C) (09/11 0534) Pulse Rate:  [62-76] 71 (09/11 0534) Resp:  [16-18] 16 (09/11 0534) BP: (98-118)/(51-69) 113/56 mmHg (09/11 0534) SpO2:  [98 %-100 %] 98 % (09/11 0136) Weight:  [165 lb 11.2 oz (75.161 kg)] 165 lb 11.2 oz (75.161 kg) (09/11 0534)  Physical Exam: General: Well-appearing woman in no acute distress.  HEENT: PERRLA. EOMI. MMM. NT/[place].  Cardiovascular: RRR no m/r/g. S3 present. No carotid bruits. No JVD.  Respiratory: CTAB. No wheezes or crackles. Normal WOB.  Abdomen: +bs, non-distended, non-tender.  Extremities: No pedal edema.  Skin: No lesions present.  Neuro: CN II-XII intact. Right facial droop without forehead involvement, continues to improve. 5/5 strength throughout, with exception of 4/5 right grip strength. Dysarthria improved since yesterday. Difficulty with word finding though can pronounce words and understand others, continues to improve (now minimally present).  Laboratory:  Recent Labs Lab 09/23/13 1029  WBC 7.2  HGB 12.3  HCT 36.6  PLT 221    Recent Labs Lab 09/23/13 1029  NA 139  K 5.1  CL 106  CO2 21  BUN 15  CREATININE 0.68  CALCIUM 9.3  PROT 7.1  BILITOT 0.5  ALKPHOS 93  ALT 15  AST 31  GLUCOSE 73   HgA1c 5.6  Lipids (cholesterol 166, triglyercides 83, HDL 41, LDL 108)   Imaging/Diagnostic Tests:  Ct Head  Wo Contrast 09/23/2013  IMPRESSION: No acute intracranial abnormality.  Mr Brain Wo Contrast 09/23/2013  1. Acute ischemic right parietal lobe infarct involving the  posterior right MCA territory. No associated hemorrhage or  significant mass effect.  2. Atrophy with mild chronic small vessel ischemic changes involving  the supratentorial white matter.  Mr Maxine Glenn Head/brain Wo Contrast 09/23/2013  Unremarkable MRA of the brain with no hemodynamically significant stenosis, proximal branch occlusion, or other abnormality identified.   Carotid dopplers: 09/24/2013  Summary: - The vertebral arteries appear patent with antegrade flow. - Findings consistent with 1-39 percent stenosis involving the right internal carotid artery and the left internal carotid artery.  Resa Miner, Med Student 09/26/2013, 7:13 AM Acting Intern, [facility] FPTS Intern pager: [phone], text pages welcome  I have seen and evaluated the above patient.  Addendum in blue. Physical exam: General: Well-appearing woman in no acute distress.  Cardiovascular: RRR no m/r/g. Respiratory: CTAB. Extremities: No LE edema.  Neuro: CN II-XII intact. 5/5 strength throughout. No dysarthria noted today.  Everlene Other DO Family Medicine PGY-3

## 2013-09-26 NOTE — Progress Notes (Signed)
Discharge orders received. Pt for discharge home today. IV d/c'd. Pt given discharge instructions with verbalized understanding. Family in room to assist with discharge. Staff brought pt downstairs via wheelchair.   

## 2013-09-26 NOTE — Progress Notes (Signed)
FMTS Attending Daily Note:  Renold Don MD  [phone] pager  Family Practice pager:  [phone] I have seen and examined this patient and have reviewed their chart. I have discussed this patient with the resident. I agree with the resident's findings, assessment and care plan.  Additionally:  - Patient continues to improve. - TEE negative.   - Plan for CRT/ICD outpt.  Loop recorder at that time. - agree plavix rather than coumadin for her - FU with cards/EP, heart failure, and [facility] as PCP - OK to DC home today.   Tobey Grim, MD 09/26/2013 5:03 PM

## 2013-09-26 NOTE — Progress Notes (Signed)
  Echocardiogram Echocardiogram Transesophageal has been performed.  Georgian Co 09/26/2013, 2:34 PM

## 2013-09-26 NOTE — Consult Note (Signed)
ELECTROPHYSIOLOGY CONSULT NOTE  Patient ID: [name], MRN: [id number], DOB/AGE: 02-22-1950 63 y.o. Admit date: 09/23/2013 Date of Consult: 09/26/2013  Primary Physician: [name], MD Primary Cardiologist: ms  Chief Complaint:  ? Loop recorder   HPI [name] is a 63 y.o. female  Presented originally acute heart failure and was found to have an ejection fraction of 20-25%. Catheterization demonstrated no coronary disease. Left bundle branch block QRS duration of 150 ms was noted. She was treated medically. Up titration has been by hypotension.   She has not had syncope. She's had no tachy  Palpitations  although she occasionally feels her heart beating hard at night.  She has been working hard on improving her functional status. She is now walking about 45 minutes a day. There is mild shortness of breath.    Past Medical History  Diagnosis Date  . GERD (gastroesophageal reflux disease)   . Arthritis   . PONV (postoperative nausea and vomiting)   . Systolic CHF     a. Dx 07/2013 - due to NICM. Cath normal cors. EF 25%.  [name] LBBB (left bundle branch block)   . Hypotension   . Mitral regurgitation     a. Mild-mod MR by echo 07/2013.  . Stroke       Surgical History:  Past Surgical History  Procedure Laterality Date  . Tonsillectomy    . Knee arthroscopy      rt  . Wrist arthroscopy  12/21/2010    Procedure: ARTHROSCOPY WRIST;  Surgeon: [name], MD;  Location:  [facility];  Service: Orthopedics;  Laterality: Right;  right wrist, repair TFCC on right, open reconstruction ECU sheath     Home Meds: Prior to Admission medications   Medication Sig Start Date End Date Taking? Authorizing Provider  aspirin EC 81 MG EC tablet Take 1 tablet (81 mg total) by mouth daily. 08/11/13  Yes Dayna N Dunn, PA-C  carvedilol (COREG) 3.125 MG tablet Take 1 tablet (3.125 mg total) by mouth 2 (two) times daily with a meal. 08/11/13  Yes Dayna N Dunn, PA-C    furosemide (LASIX) 20 MG tablet Take 1 tablet (20 mg) by mouth once daily as needed for fluid weight gain (more than 3 lbs from "dry weight"). 08/11/13  Yes Laurann Montana, PA-C    Inpatient Medications:  .  stroke: mapping our early stages of recovery book   Does not apply Once  . atorvastatin  40 mg Oral q1800  . carvedilol  3.125 mg Oral BID WC  . clopidogrel  75 mg Oral Daily  . enoxaparin (LOVENOX) injection  40 mg Subcutaneous Q24H  . polyethylene glycol  17 g Oral Daily  . senna-docusate  1 tablet Oral Daily  . sodium chloride  3 mL Intravenous Q12H    Allergies:  Allergies  Allergen Reactions  . Onion Other (See Comments)    gas  . Tomato Other (See Comments)    reflux    History   Social History  . Marital Status: Married    Spouse Name: N/A    Number of Children: N/A  . Years of Education: N/A   Occupational History  . Not on file.   Social History Main Topics  . Smoking status: Never Smoker   . Smokeless tobacco: Not on file  . Alcohol Use: No  . Drug Use: No  . Sexual Activity: Not on file   Other Topics Concern  . 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      Family History  Problem Relation Age of Onset  . Congestive Heart Failure Mother      ROS:  Please see the history of present illness.     All other systems reviewed and negative.    Physical Exam: Blood pressure 115/73, pulse 73, temperature 98.2 F (36.8 C), temperature source Oral, resp. rate 15, weight 165 lb 11.2 oz (75.161 kg), SpO2 97.00%. General: Well developed, well nourished female in no acute distress. Head: Normocephalic, atraumatic, sclera non-icteric, no xanthomas, nares are without discharge. EENT: normal Lymph Nodes:  none Back: without scoliosis/kyphosis , no CVA tendersness Neck: Negative for carotid bruits. JVD not elevated. Lungs: Clear bilaterally to auscultation without wheezes, rales, or rhonchi. Breathing is  unlabored. Heart: RRR with S1 S2. No  murmur , rubs, or gallops appreciated. Abdomen: Soft, non-tender, non-distended with normoactive bowel sounds. No hepatomegaly. No rebound/guarding. No obvious abdominal masses. Msk:  Strength and tone appear normal for age. Extremities: No clubbing or cyanosis. No edema.  Distal pedal pulses are 2+ and equal bilaterally. Skin: Warm and Dry Neuro: Alert and oriented X 3. CN III-XII intact Grossly normal sensory and motor function . Psych:  Responds to questions appropriately with a normal affect.      Labs: Cardiac Enzymes No results found for this basename: CKTOTAL, CKMB, TROPONINI,  in the last 72 hours CBC Lab Results  Component Value Date   WBC 7.2 09/23/2013   HGB 12.3 09/23/2013   HCT 36.6 09/23/2013   MCV 87.1 09/23/2013   PLT 221 09/23/2013   PROTIME: No results found for this basename: LABPROT, INR,  in the last 72 hours Chemistry  Recent Labs Lab 09/23/13 1029  NA 139  K 5.1  CL 106  CO2 21  BUN 15  CREATININE 0.68  CALCIUM 9.3  PROT 7.1  BILITOT 0.5  ALKPHOS 93  ALT 15  AST 31  GLUCOSE 73   Lipids Lab Results  Component Value Date   CHOL 166 09/23/2013   HDL 41 09/23/2013   LDLCALC 098* 09/23/2013   TRIG 83 09/23/2013   BNP Pro B Natriuretic peptide (BNP)  Date/Time Value Ref Range Status  08/06/2013 10:11 PM 1317.0* 0 - 125 pg/mL Final  11/20/2008  8:25 PM 4.0  0.0-100.0 pg/mL Final   Miscellaneous No results found for this basename: DDIMER    Radiology/Studies:  Ct Head Wo Contrast  09/23/2013   CLINICAL DATA:  Frontal headaches  EXAM: CT HEAD WITHOUT CONTRAST  TECHNIQUE: Contiguous axial images were obtained from the base of the skull through the vertex without intravenous contrast.  COMPARISON:  None.  FINDINGS: The bony calvarium is intact. The ventricles are of normal size and configuration. No findings to suggest acute hemorrhage, acute infarction or space-occupying mass lesion are noted.  IMPRESSION: No acute  intracranial abnormality.   Electronically Signed   By: Alcide Clever M.D.   On: 09/23/2013 11:19   Mr Brain Wo Contrast  09/23/2013   CLINICAL DATA:  Right-sided facial droop, dysarthria, expressive aphasia. Concern for TIA versus stroke.  EXAM: MRI HEAD WITHOUT CONTRAST  MRA HEAD WITHOUT CONTRAST  TECHNIQUE: Multiplanar, multiecho pulse sequences of the brain and surrounding structures were obtained without intravenous contrast. Angiographic images of the head were obtained using MRA technique without contrast.  COMPARISON:  Prior CT from earlier the same day.  FINDINGS: MRI HEAD  FINDINGS  There is a new wedge-shaped area of restricted diffusion involving the cortical gray matter and subcortical white matter of the posterior right MCA territory in the right parietal lobe (series 4, image 20). Finding is consistent with acute ischemic infarct. Overall size of the infarct measures 1.8 x 3.5 cm. No associated intracranial hemorrhage. Minimal gyral edema seen on T2/FLAIR weighted sequence within this region. No significant mass effect.  Mild generalized atrophy present. Additional scattered patchy T2/FLAIR hyperintensity seen within the deep white matter both cerebral hemispheres noted, likely related to mild chronic small vessel ischemic disease.  No mass lesion or midline shift. Ventricles are normal in size without evidence of hydrocephalus. No extra-axial fluid collection.  Craniocervical junction is normal. Pituitary gland within normal limits. No acute abnormality seen about either orbit.  Calvarium and scalp soft tissues within normal limits.  Paranasal sinuses and mastoid air cells are clear.  MRA HEAD FINDINGS  ANTERIOR CIRCULATION:  Visualized distal cervical segments of the internal carotid arteries are widely patent with antegrade flow. Petrous and cavernous segments are widely patent. A1 segments are symmetric and well opacified. Anterior communicating artery within normal limits. Anterior cerebral  arteries well opacified.  Right right MCA bifurcation is normal. The distal right MCA branches are grossly opacified, with no proximal branch occlusion.  Left M1 segment, left MCA bifurcation, and distal left MCA branches are within normal limits. M1 segment is well opacified without evidence of proximal branch occlusion or hemodynamically significant stenosis.  POSTERIOR CIRCULATION:  The left vertebral artery is dominant. Posterior inferior cerebral arteries within normal limits. Vertebrobasilar junction and basilar artery are widely patent with antegrade flow. Posterior cerebral arteries and superior cerebellar arteries widely patent. No hemodynamically significant stenosis or proximal branch occlusion seen within the posterior circulation.  No intracranial aneurysm.  IMPRESSION: MRI HEAD IMPRESSION:  1. Acute ischemic right parietal lobe infarct involving the posterior right MCA territory. No associated hemorrhage or significant mass effect. 2. Atrophy with mild chronic small vessel ischemic changes involving the supratentorial white matter.  MRA HEAD IMPRESSION:  Unremarkable MRA of the brain with no hemodynamically significant stenosis, proximal branch occlusion, or other abnormality identified.   Electronically Signed   By: Rise Mu M.D.   On: 09/23/2013 22:39   Mr Maxine Glenn Head/brain Wo Cm  09/23/2013   CLINICAL DATA:  Right-sided facial droop, dysarthria, expressive aphasia. Concern for TIA versus stroke.  EXAM: MRI HEAD WITHOUT CONTRAST  MRA HEAD WITHOUT CONTRAST  TECHNIQUE: Multiplanar, multiecho pulse sequences of the brain and surrounding structures were obtained without intravenous contrast. Angiographic images of the head were obtained using MRA technique without contrast.  COMPARISON:  Prior CT from earlier the same day.  FINDINGS: MRI HEAD FINDINGS  There is a new wedge-shaped area of restricted diffusion involving the cortical gray matter and subcortical white matter of the posterior right  MCA territory in the right parietal lobe (series 4, image 20). Finding is consistent with acute ischemic infarct. Overall size of the infarct measures 1.8 x 3.5 cm. No associated intracranial hemorrhage. Minimal gyral edema seen on T2/FLAIR weighted sequence within this region. No significant mass effect.  Mild generalized atrophy present. Additional scattered patchy T2/FLAIR hyperintensity seen within the deep white matter both cerebral hemispheres noted, likely related to mild chronic small vessel ischemic disease.  No mass lesion or midline shift. Ventricles are normal in size without evidence of hydrocephalus. No extra-axial fluid collection.  Craniocervical junction is normal. Pituitary gland within normal limits. No acute abnormality  seen about either orbit.  Calvarium and scalp soft tissues within normal limits.  Paranasal sinuses and mastoid air cells are clear.  MRA HEAD FINDINGS  ANTERIOR CIRCULATION:  Visualized distal cervical segments of the internal carotid arteries are widely patent with antegrade flow. Petrous and cavernous segments are widely patent. A1 segments are symmetric and well opacified. Anterior communicating artery within normal limits. Anterior cerebral arteries well opacified.  Right right MCA bifurcation is normal. The distal right MCA branches are grossly opacified, with no proximal branch occlusion.  Left M1 segment, left MCA bifurcation, and distal left MCA branches are within normal limits. M1 segment is well opacified without evidence of proximal branch occlusion or hemodynamically significant stenosis.  POSTERIOR CIRCULATION:  The left vertebral artery is dominant. Posterior inferior cerebral arteries within normal limits. Vertebrobasilar junction and basilar artery are widely patent with antegrade flow. Posterior cerebral arteries and superior cerebellar arteries widely patent. No hemodynamically significant stenosis or proximal branch occlusion seen within the posterior  circulation.  No intracranial aneurysm.  IMPRESSION: MRI HEAD IMPRESSION:  1. Acute ischemic right parietal lobe infarct involving the posterior right MCA territory. No associated hemorrhage or significant mass effect. 2. Atrophy with mild chronic small vessel ischemic changes involving the supratentorial white matter.  MRA HEAD IMPRESSION:  Unremarkable MRA of the brain with no hemodynamically significant stenosis, proximal branch occlusion, or other abnormality identified.   Electronically Signed   By: Rise Mu M.D.   On: 09/23/2013 22:39    EKG: sinus rhythm with left bundle branch block and a QRS duration of 150 ms   Assessment and Plan:  Nonischemic cardiomyopathy  Congestive heart failure-chronic-systolic  Hypotension  Left bundle branch block  Stroke  The patient has had a stroke in the context of nonischemic myopathy. There has been persistent left ventricular dysfunction with perhaps interval worsening with ejection fraction about 15%. She has not tolerated ideal dosing guidelines directed therapy because of hypotension.  It seems to me that she will likely come to CRT D. Implantation in the near future, mid October and will have been 3 months since she presented. The fact there has been no interval improvement I think portends poorly for recovery and at that juncture it would be appropriate to pursue implantation. At that point atrial monitoring would be available and as such, it would not pursue link/loop recorder insertion now  I Dr. DM from the heart failure service will be following her as well as the patient and her family. I  Sherryl Manges

## 2013-09-26 NOTE — Progress Notes (Signed)
Endoscopy nurses came to transport pt downstairs for her TEE. Family aware.

## 2013-09-26 NOTE — Interval H&P Note (Signed)
History and Physical Interval Note:  09/26/2013 2:03 PM  [name]  has presented today for surgery, with the diagnosis of stroke  The various methods of treatment have been discussed with the patient and family. After consideration of risks, benefits and other options for treatment, the patient has consented to  Procedure(s): TRANSESOPHAGEAL ECHOCARDIOGRAM (TEE) (N/A) as a surgical intervention .  The patient's history has been reviewed, patient examined, no change in status, stable for surgery.  I have reviewed the patient's chart and labs.  Questions were answered to the patient's satisfaction.     Lina Hitch [employer]

## 2013-09-26 NOTE — Progress Notes (Signed)
PIV malfunctioned. Staffs unable to restart PIV. IV Team Maralyn Sago) notified. Will continue to monitor.  Sim Boast, RN

## 2013-09-29 ENCOUNTER — Encounter (HOSPITAL_COMMUNITY): Payer: Self-pay | Admitting: Cardiology

## 2013-09-29 NOTE — Progress Notes (Signed)
PCP: Family Medicine EP: Dr Graciela Husbands.  Primary Cardiologist: Dr Shirlee Latch  Neurologist: Dr Pearlean Brownie  HPI: [name] is a 63 y.o. female with recently diagnosed systolic CHF in 07/2013 (EF 25%) due to NICM (no significant CAD), LBBB, mild-mod MR who presented to [facility] 09/23/2013 with a stroke.   Admitted 9/8 with right sided facial droop and expressive aphasia, CVA on MRI. Had TEE EF 15% . Evaluated by Dr Graciela Husbands while hospitalized.   She returns for post hospital follow up. She continues to recover from CVA. Still with a little difficulty word finding.  Denies SOB/PND/Orrthopnea. Weight at home 154-160 pounds. Taking lasix 3-4 times over the last week. Able to walk 4-5 blocks. Taking all medications. Has  Follow up with Dr Graciela Husbands for possible CRT-D. Medicaid pending. Not currently .    PMH: 1. Nonischemic cardiomyopathy: Echo (7/15) with severe LV dilation, EF 25% with diffuse hypokinesis, mild to moderate MR.  RHC/ LHC 08/08/13 with RA 4, PA 23/8, PCWP 11, CI 2.52; no CAD.  TEE (9/15) with EF 15-20%, severe global hypokinesis, no LV thrombus, moderate MR, normal RV.   2. CVA: 9/15 with dysarthria.  Carotids 9/15 with no significant CAD.  3. LBBB  Labs 09/23/13 K 5.1 Creatinine 0.68 Cholesterol 166 TGl 83 HDL 41, LDL 108  ECG (8/15): NSR, LBBB with QRS 150 msec   SH: lives with her son and husband. Does not drink or smoke. Currently not working.   FH: Mom had A fib and Heart Failure. Deceased 82        Sister has lupus  ROS: All systems negative except as listed in HPI, PMH and Problem List.   Current Outpatient Prescriptions  Medication Sig Dispense Refill  . atorvastatin (LIPITOR) 40 MG tablet Take 1 tablet (40 mg total) by mouth daily at 6 PM.  30 tablet  0  . carvedilol (COREG) 3.125 MG tablet Take 1 tablet (3.125 mg total) by mouth 2 (two) times daily with a meal.  60 tablet  6  . clopidogrel (PLAVIX) 75 MG tablet Take 1 tablet (75 mg total) by mouth daily.  30 tablet  0  .  furosemide (LASIX) 20 MG tablet Take 1 tablet (20 mg) by mouth once daily as needed for fluid weight gain (more than 3 lbs from "dry weight").  30 tablet  3   No current facility-administered medications for this encounter.     PHYSICAL EXAM: Filed Vitals:   09/30/13 1045  BP: 104/68  Pulse: 64  Resp: 18  Weight: 166 lb 8 oz (75.524 kg)  SpO2: 100%    General:  Well appearing. No resp difficulty. Husband and son present HEENT: normal Neck: supple. JVP flat. Carotids 2+ bilaterally; no bruits. No lymphadenopathy or thryomegaly appreciated. Cor: PMI normal. Regular rate & rhythm. No rubs, gallops or murmurs. Lungs: clear Abdomen: soft, nontender, nondistended. No hepatosplenomegaly. No bruits or masses. Good bowel sounds. Extremities: no cyanosis, clubbing, rash, edema Neuro: alert & orientedx3, cranial nerves grossly intact. Moves all 4 extremities w/o difficulty. Affect pleasant. Difficult word finding.    ASSESSMENT & PLAN:  1. Chronic Systolic Heart Failure: Nonischemic cardiomyopathy.  Cath without CAD in 07/2013. EF 15-20% per recent TEE. NYHA II. Volume status stable.  She has a LBBB. Cause of cardiomyopathy uncertain, ?prior myocarditis.  - Continue lasix 20 mg as needed.  - Continue carvedilol 3.125 mg twice a day - Add lisinopril  1.25 mg in am and pm will need to watch BP closely (  was hypotensive last time lisinopril was attempted). - Check BMET in 1 week.  - When she has Medicaid, will arrange cardiac MRI to look for infiltrative disease.  - She has LBBB and has a persistently depressed EF. She has been seen by Dr. Graciela Husbands.  Plan for CRT-D when Medicaid goes through.  - Reinforced daily weights, low salt food choices, and limiting fluid intake to < 2 liters per day.  2. LBBB: Has follow up with Dr Graciela Husbands for possible CRT-D  3. CVA: ?Embolic.  No atrial fibrillation has been detected so far.  She will continue Plavix for now.  When she has CRT-D implanted, will have atrial  lead to monitor for atrial fibrillation.  If this is identified, will need anticoagulation.  4. Hyperlipidemia: Check lipids/LFTs in 2 months.   Follow up 1 week for BMET. Follow up in 2 weeks   CLEGG,AMY NP-C  11:31 AM  Patient seen with NP, agree with the above note.  She is doing well currently.  Will work on getting her on low dose lisinopril.  When Medicaid goes through, will arrange for cardiac MRI and likely CRT-D.  CRT-D device will allow monitoring for atrial fibrillation (recent CVA).   [name] 09/30/2013 1:34 PM

## 2013-09-30 ENCOUNTER — Ambulatory Visit (HOSPITAL_COMMUNITY)
Admit: 2013-09-30 | Discharge: 2013-09-30 | Disposition: A | Discharge status: Home / Self Care | Payer: Medicaid Other | Source: Ambulatory Visit | Attending: Cardiology | Admitting: Cardiology

## 2013-09-30 ENCOUNTER — Encounter (HOSPITAL_COMMUNITY): Payer: Self-pay

## 2013-09-30 VITALS — BP 104/68 | HR 64 | Resp 18 | Wt 166.5 lb

## 2013-09-30 DIAGNOSIS — I69922 Dysarthria following unspecified cerebrovascular disease: Secondary | ICD-10-CM | POA: Diagnosis not present

## 2013-09-30 DIAGNOSIS — I447 Left bundle-branch block, unspecified: Secondary | ICD-10-CM | POA: Diagnosis not present

## 2013-09-30 DIAGNOSIS — I509 Heart failure, unspecified: Secondary | ICD-10-CM | POA: Insufficient documentation

## 2013-09-30 DIAGNOSIS — I6350 Cerebral infarction due to unspecified occlusion or stenosis of unspecified cerebral artery: Secondary | ICD-10-CM | POA: Diagnosis not present

## 2013-09-30 DIAGNOSIS — I5022 Chronic systolic (congestive) heart failure: Secondary | ICD-10-CM

## 2013-09-30 DIAGNOSIS — Z7902 Long term (current) use of antithrombotics/antiplatelets: Secondary | ICD-10-CM | POA: Diagnosis not present

## 2013-09-30 DIAGNOSIS — Z8249 Family history of ischemic heart disease and other diseases of the circulatory system: Secondary | ICD-10-CM | POA: Diagnosis not present

## 2013-09-30 DIAGNOSIS — E785 Hyperlipidemia, unspecified: Secondary | ICD-10-CM | POA: Diagnosis not present

## 2013-09-30 DIAGNOSIS — I428 Other cardiomyopathies: Secondary | ICD-10-CM | POA: Diagnosis not present

## 2013-09-30 DIAGNOSIS — Z79899 Other long term (current) drug therapy: Secondary | ICD-10-CM | POA: Diagnosis not present

## 2013-09-30 DIAGNOSIS — I639 Cerebral infarction, unspecified: Secondary | ICD-10-CM

## 2013-09-30 DIAGNOSIS — I059 Rheumatic mitral valve disease, unspecified: Secondary | ICD-10-CM | POA: Diagnosis not present

## 2013-09-30 MED ORDER — LISINOPRIL 2.5 MG PO TABS: 1.2500 mg | ORAL_TABLET | Freq: Two times a day (BID) | ORAL | Status: DC

## 2013-09-30 NOTE — Patient Instructions (Signed)
Follow up in 1 week for lab work  Follow up in 2 weeks wit Dr Shirlee Latch  Take lisinopril 1.25 mg twice a day  Do the following things EVERYDAY: 1) Weigh yourself in the morning before breakfast. Write it down and keep it in a log. 2) Take your medicines as prescribed 3) Eat low salt foods-Limit salt (sodium) to 2000 mg per day.  4) Stay as active as you can everyday 5) Limit all fluids for the day to less than 2 liters

## 2013-09-30 NOTE — Discharge Summary (Signed)
Family Medicine Teaching Service  Discharge Note : Attending Jeff Ladaysha Soutar MD Pager [phone] Inpatient Team Pager:  [phone]  I have reviewed this patient and the patient's chart and have discussed discharge planning with the resident at the time of discharge. I agree with the discharge plan as above.    

## 2013-10-01 NOTE — ED Provider Notes (Signed)
CSN: [id number]     Arrival date & time 09/23/13  0946 History   First MD Initiated Contact with Patient 09/23/13 [phone]     Chief Complaint  Patient presents with  . Stroke Symptoms     (Consider location/radiation/quality/duration/timing/severity/associated sxs/prior Treatment) HPI  63 year old female with expressive aphasia and problems with coordination in bilateral upper extremities. Stuttering symptoms over the past day and a half. Worse this morning which is why she eventually came in for evaluation. Speech stuttering and at times cannot get out what she wants to say. Typed something on the computer and then realized it didn't make sense. Last night was having difficulty doing crochet. Mild HA. No other pain complaints. Denies any dizziness, lightheadedness or acute visual complaints. No history similar symptoms prior to about a day ago.  Past Medical History  Diagnosis Date  . GERD (gastroesophageal reflux disease)   . Arthritis   . PONV (postoperative nausea and vomiting)   . Systolic CHF     a. Dx 07/2013 - due to NICM. Cath normal cors. EF 25%.  [name] LBBB (left bundle branch block)   . Hypotension   . Mitral regurgitation     a. Mild-mod MR by echo 07/2013.  . Stroke    Past Surgical History  Procedure Laterality Date  . Tonsillectomy    . Knee arthroscopy      rt  . Wrist arthroscopy  12/21/2010    Procedure: ARTHROSCOPY WRIST;  Surgeon: [name], MD;  Location: [facility];  Service: Orthopedics;  Laterality: Right;  right wrist, repair TFCC on right, open reconstruction ECU sheath  . Tee without cardioversion N/A 09/26/2013    Procedure: TRANSESOPHAGEAL ECHOCARDIOGRAM (TEE);  Surgeon: [name], MD;  Location: [facility];  Service: Cardiovascular;  Laterality: N/A;   Family History  Problem Relation Age of Onset  . Congestive Heart Failure Mother    History  Substance Use Topics  . Smoking status: Never Smoker   . Smokeless tobacco: Not on  file  . Alcohol Use: No   OB History   Grav Para Term Preterm Abortions TAB SAB Ect Mult Living                 Review of Systems    Allergies  Onion and Tomato  Home Medications   Prior to Admission medications   Medication Sig Start Date End Date Taking? Authorizing Provider  carvedilol (COREG) 3.125 MG tablet Take 1 tablet (3.125 mg total) by mouth 2 (two) times daily with a meal. 08/11/13  Yes Dayna N Dunn, PA-C  furosemide (LASIX) 20 MG tablet Take 1 tablet (20 mg) by mouth once daily as needed for fluid weight gain (more than 3 lbs from "dry weight"). 08/11/13  Yes Dayna N Dunn, PA-C  atorvastatin (LIPITOR) 40 MG tablet Take 1 tablet (40 mg total) by mouth daily at 6 PM. 09/26/13   Tommie Sams, DO  clopidogrel (PLAVIX) 75 MG tablet Take 1 tablet (75 mg total) by mouth daily. 09/26/13   Tommie Sams, DO  lisinopril (ZESTRIL) 2.5 MG tablet Take 0.5 tablets (1.25 mg total) by mouth 2 (two) times daily. 09/30/13   Amy D Clegg, NP   BP 108/89  Pulse 93  Temp(Src) 98.2 F (36.8 C) (Oral)  Resp 20  Wt 165 lb 11.2 oz (75.161 kg)  SpO2 98% Physical Exam  Nursing note and vitals reviewed. Constitutional: She appears well-developed and well-nourished. No distress.  HENT:  Head: Normocephalic  and atraumatic.  Eyes: Conjunctivae are normal. Right eye exhibits no discharge. Left eye exhibits no discharge.  Neck: Neck supple.  Cardiovascular: Normal rate, regular rhythm and normal heart sounds.  Exam reveals no gallop and no friction rub.   No murmur heard. Pulmonary/Chest: Effort normal and breath sounds normal. No respiratory distress.  Abdominal: Soft. She exhibits no distension. There is no tenderness.  Musculoskeletal: She exhibits no edema and no tenderness.  Neurological: She is alert.  Mild expressive aphasia. Patient with some stuttering and difficulty with word finding but mostly understandable. Cranial nerve are intact. Perhaps some mild decreased strength right grip. No  motor deficits noted otherwise. Good finger to nose bilaterally.  Skin: Skin is warm and dry.  Psychiatric: She has a normal mood and affect. Her behavior is normal. Thought content normal.    ED Course  Procedures (including critical care time) Labs Review Labs Reviewed  COMPREHENSIVE METABOLIC PANEL - Abnormal; Notable for the following:    Albumin 3.4 (*)    All other components within normal limits  LIPID PANEL - Abnormal; Notable for the following:    LDL Cholesterol 108 (*)    All other components within normal limits  PROTIME-INR  APTT  CBC  DIFFERENTIAL  HEMOGLOBIN A1C  I-STAT TROPOININ, ED    Imaging Review No results found.   EKG Interpretation   Date/Time:  Tuesday September 23 2013 09:56:04 EDT Ventricular Rate:  76 PR Interval:  132 QRS Duration: 124 QT Interval:  397 QTC Calculation: 446 R Axis:   81 Text Interpretation:  Sinus rhythm Left bundle branch block ED PHYSICIAN  INTERPRETATION AVAILABLE IN [facility] Confirmed by TEST, Record  ([id number]) on 09/25/2013 7:20:20 AM      MDM   Final diagnoses:  Cerebral infarction due to embolism of cerebral artery  Chronic systolic CHF (congestive heart failure)  Left bundle branch block  Non-ischemic cardiomyopathy  Acute systolic CHF (congestive heart failure), NYHA class 4  Mitral regurgitation  Chronic hypotension    63 year old female with symptoms consistent with a CVA. CT of the head w/o acute abnormality. Admitted for further workup.    Raeford Razor, MD 10/01/13 [phone]

## 2013-10-02 ENCOUNTER — Encounter: Payer: Self-pay | Admitting: Internal Medicine

## 2013-10-02 ENCOUNTER — Encounter (HOSPITAL_COMMUNITY): Payer: Self-pay

## 2013-10-07 ENCOUNTER — Ambulatory Visit (HOSPITAL_COMMUNITY)
Admission: RE | Admit: 2013-10-07 | Discharge: 2013-10-07 | Disposition: A | Discharge status: Home / Self Care | Payer: Medicaid Other | Source: Ambulatory Visit | Attending: Internal Medicine | Admitting: Internal Medicine

## 2013-10-07 ENCOUNTER — Inpatient Hospital Stay (HOSPITAL_COMMUNITY): Payer: Self-pay

## 2013-10-07 DIAGNOSIS — I5022 Chronic systolic (congestive) heart failure: Secondary | ICD-10-CM

## 2013-10-07 DIAGNOSIS — I509 Heart failure, unspecified: Secondary | ICD-10-CM | POA: Insufficient documentation

## 2013-10-07 LAB — BASIC METABOLIC PANEL
Anion gap: 14 (ref 5–15)
BUN: 14 mg/dL (ref 6–23)
CALCIUM: 9.4 mg/dL (ref 8.4–10.5)
CO2: 21 mEq/L (ref 19–32)
CREATININE: 0.65 mg/dL (ref 0.50–1.10)
Chloride: 106 mEq/L (ref 96–112)
GFR calc non Af Amer: >90 mL/min (ref >90)
Glucose, Bld: 115 mg/dL — ABNORMAL HIGH (ref 70–99)
Potassium: 3.9 mEq/L (ref 3.7–5.3)
Sodium: 141 mEq/L (ref 137–147)

## 2013-10-10 ENCOUNTER — Ambulatory Visit: Payer: Self-pay | Admitting: Cardiology

## 2013-10-13 ENCOUNTER — Telehealth (HOSPITAL_COMMUNITY): Payer: Self-pay | Admitting: Cardiology

## 2013-10-13 MED ORDER — CLOPIDOGREL BISULFATE 75 MG PO TABS: 75.0000 mg | ORAL_TABLET | Freq: Every day | ORAL | Status: DC

## 2013-10-13 NOTE — Telephone Encounter (Signed)
Pt left voicemail to request refill on Plavix As requested med refill sent into pharmacy

## 2013-10-14 ENCOUNTER — Telehealth (HOSPITAL_COMMUNITY): Payer: Self-pay | Admitting: Vascular Surgery

## 2013-10-14 ENCOUNTER — Encounter (HOSPITAL_COMMUNITY): Payer: Self-pay

## 2013-10-14 NOTE — Telephone Encounter (Signed)
Refill Plavix 

## 2013-10-15 MED ORDER — CLOPIDOGREL BISULFATE 75 MG PO TABS: 75.0000 mg | ORAL_TABLET | Freq: Every day | ORAL | Status: DC

## 2013-10-15 NOTE — Telephone Encounter (Signed)
As requested, med refills sent into pharmacy 

## 2013-10-20 ENCOUNTER — Ambulatory Visit (HOSPITAL_COMMUNITY)
Admission: RE | Admit: 2013-10-20 | Discharge: 2013-10-20 | Disposition: A | Discharge status: Home / Self Care | Payer: Medicaid Other | Source: Ambulatory Visit | Attending: Internal Medicine | Admitting: Internal Medicine

## 2013-10-20 ENCOUNTER — Encounter (HOSPITAL_COMMUNITY): Payer: Self-pay

## 2013-10-20 VITALS — BP 110/78 | HR 68 | Wt 167.6 lb

## 2013-10-20 DIAGNOSIS — E785 Hyperlipidemia, unspecified: Secondary | ICD-10-CM | POA: Diagnosis not present

## 2013-10-20 DIAGNOSIS — I340 Nonrheumatic mitral (valve) insufficiency: Secondary | ICD-10-CM | POA: Insufficient documentation

## 2013-10-20 DIAGNOSIS — I5022 Chronic systolic (congestive) heart failure: Secondary | ICD-10-CM | POA: Insufficient documentation

## 2013-10-20 DIAGNOSIS — I428 Other cardiomyopathies: Secondary | ICD-10-CM | POA: Diagnosis not present

## 2013-10-20 DIAGNOSIS — Z7902 Long term (current) use of antithrombotics/antiplatelets: Secondary | ICD-10-CM | POA: Insufficient documentation

## 2013-10-20 DIAGNOSIS — I429 Cardiomyopathy, unspecified: Secondary | ICD-10-CM

## 2013-10-20 DIAGNOSIS — Z79899 Other long term (current) drug therapy: Secondary | ICD-10-CM | POA: Insufficient documentation

## 2013-10-20 DIAGNOSIS — Z8249 Family history of ischemic heart disease and other diseases of the circulatory system: Secondary | ICD-10-CM | POA: Diagnosis not present

## 2013-10-20 DIAGNOSIS — Z8673 Personal history of transient ischemic attack (TIA), and cerebral infarction without residual deficits: Secondary | ICD-10-CM | POA: Insufficient documentation

## 2013-10-20 DIAGNOSIS — I9589 Other hypotension: Secondary | ICD-10-CM | POA: Diagnosis not present

## 2013-10-20 DIAGNOSIS — I447 Left bundle-branch block, unspecified: Secondary | ICD-10-CM | POA: Insufficient documentation

## 2013-10-20 NOTE — Patient Instructions (Signed)
Doing great.  Start taking lisinopril 1.25 mg (1/2 tablet of 2.5 mg) in the morning and 1.25 mg (1/2 tablet of 2.5 mg) in the evening.  Follow up next Tuesday for labs. You can come anytime between 8:30 and 4pm.  Follow up in 1 month  Call any issues.  Do the following things EVERYDAY: 1) Weigh yourself in the morning before breakfast. Write it down and keep it in a log. 2) Take your medicines as prescribed 3) Eat low salt foods-Limit salt (sodium) to 2000 mg per day.  4) Stay as active as you can everyday 5) Limit all fluids for the day to less than 2 liters 6)

## 2013-10-20 NOTE — Progress Notes (Signed)
Patient ID: [name], female   DOB: 01-14-1951, 63 y.o.   MRN: [id number] PCP: Family Medicine EP: Dr Graciela HusbandsKlein.  Primary Cardiologist: Dr Shirlee LatchMcLean  Neurologist: Dr Pearlean BrownieSethi  HPI: [name] is a 63 y.o. female with chronic systolic CHF due to NICM (no significant CAD), LBBB, mild-mod MR and CVA (09/2013).  Admitted 9/8 with right sided facial droop and expressive aphasia, CVA on MRI. Had TEE EF 15% . Evaluated by Dr Graciela HusbandsKlein while hospitalized.   Follow up for Heart Failure: Last visit asked her to start on lisinopril 1.25 mg BID, however she did not start because she says her BP has been too low; SBP 105-110. Overall feeling pretty good. Reports chest hurts a little some when she bends over. Denies SOB, orthopnea, PND or edema. Able to walk about from the bus stop to entrance of the hospital with no issues. Weight at home 161 lbs. Takes lasix every 3-4 days. Taking medications as prescribed. Medicaid still pending. Following a low salt diet and drinking less than 2L a day. Able to walk 30 minutes at a time if weather is ok.   PMH: 1. Nonischemic cardiomyopathy: Echo (7/15) with severe LV dilation, EF 25% with diffuse hypokinesis, mild to moderate MR.  RHC/ LHC 08/08/13 with RA 4, PA 23/8, PCWP 11, CI 2.52; no CAD.  TEE (9/15) with EF 15-20%, severe global hypokinesis, no LV thrombus, moderate MR, normal RV.   2. CVA: 9/15 with dysarthria.  Carotids 9/15 with no significant CAD.  3. LBBB  Labs 09/23/13 K 5.1 Creatinine 0.68 Cholesterol 166 TGl 83 HDL 41, LDL 108 10/07/13: K 3.9, creatinine 0.65  SH: lives with her son and husband. Does not drink or smoke. Currently not working.   FH: Mom had A fib and Heart Failure. Deceased [id number]        Sister has lupus  ROS: All systems negative except as listed in HPI, PMH and Problem List.   Current Outpatient Prescriptions  Medication Sig Dispense Refill  . atorvastatin (LIPITOR) 40 MG tablet Take 1 tablet (40 mg total) by mouth daily at 6 PM.  30 tablet  0   . carvedilol (COREG) 3.125 MG tablet Take 1 tablet (3.125 mg total) by mouth 2 (two) times daily with a meal.  60 tablet  6  . clopidogrel (PLAVIX) 75 MG tablet Take 1 tablet (75 mg total) by mouth daily.  30 tablet  2  . furosemide (LASIX) 20 MG tablet Take 1 tablet (20 mg) by mouth once daily as needed for fluid weight gain (more than 3 lbs from "dry weight").  30 tablet  3   No current facility-administered medications for this encounter.    Filed Vitals:   10/20/13 1050  BP: 110/78  Pulse: 68  Weight: 167 lb 9.6 oz (76.023 kg)  SpO2: 100%    PHYSICAL EXAM: General:  Well appearing. No resp difficulty. Husband and son present HEENT: normal Neck: supple. JVP flat. Carotids 2+ bilaterally; no bruits. No lymphadenopathy or thryomegaly appreciated. Cor: PMI normal. Regular rate & rhythm. No rubs, gallops or murmurs. Lungs: clear Abdomen: soft, nontender, nondistended. No hepatosplenomegaly. No bruits or masses. Good bowel sounds. Extremities: no cyanosis, clubbing, rash, edema Neuro: alert & orientedx3, cranial nerves grossly intact. Moves all 4 extremities w/o difficulty. Affect pleasant. Difficult word finding.    ASSESSMENT & PLAN:  1. Chronic Systolic Heart Failure: NICM Cath without CAD in 07/2013. EF 15-20% per TEE (09/2013) - NYHA II symptoms and volume status stable.  Will continue lasix 20 mg PRN.  - Will continue coreg 3.125 mg BID.  - Patient was supposed to start lisinopril 1.25 mg BID last visit, however did not because scared her BP was too low. Discussed SBP 110 stable and to please try to start taking. Call if any dizziness. Check BMET in 1 week. - When she has Medicaid, will arrange cardiac MRI to look for infiltrative disease.  - She has LBBB and has a persistently depressed EF. She has been seen by Dr. Graciela Husbands.  Plan for CRT-D when Medicaid goes through.  - Reinforced daily weights, low salt food choices, and limiting fluid intake to < 2 liters per day.  2. LBBB: Has  follow up with Dr Graciela Husbands for possible CRT-D  3. CVA: ?Embolic.  No atrial fibrillation has been detected so far.  She will continue Plavix for now.  When she has CRT-D implanted, will have atrial lead to monitor for atrial fibrillation.  If this is identified, will need anticoagulation.  4. Hyperlipidemia: Check lipids/LFTs in 2 months.   F/U 1 month, labs 1 week Ulla Potash B NP-C  10:54 AM

## 2013-10-27 ENCOUNTER — Encounter (HOSPITAL_COMMUNITY): Payer: Self-pay

## 2013-10-28 ENCOUNTER — Encounter: Payer: Self-pay | Admitting: *Deleted

## 2013-10-28 ENCOUNTER — Ambulatory Visit (HOSPITAL_COMMUNITY)
Admission: RE | Admit: 2013-10-28 | Discharge: 2013-10-28 | Disposition: A | Discharge status: Home / Self Care | Payer: Medicaid Other | Source: Ambulatory Visit | Attending: Cardiology | Admitting: Cardiology

## 2013-10-28 DIAGNOSIS — I5022 Chronic systolic (congestive) heart failure: Secondary | ICD-10-CM | POA: Insufficient documentation

## 2013-10-28 LAB — BASIC METABOLIC PANEL
Anion gap: 12 (ref 5–15)
BUN: 14 mg/dL (ref 6–23)
CHLORIDE: 106 meq/L (ref 96–112)
CO2: 21 mEq/L (ref 19–32)
CREATININE: 0.72 mg/dL (ref 0.50–1.10)
Calcium: 9.4 mg/dL (ref 8.4–10.5)
GFR calc non Af Amer: 89 mL/min — ABNORMAL LOW (ref >90)
Glucose, Bld: 89 mg/dL (ref 70–99)
Potassium: 4.2 mEq/L (ref 3.7–5.3)
Sodium: 139 mEq/L (ref 137–147)

## 2013-10-30 ENCOUNTER — Ambulatory Visit (INDEPENDENT_AMBULATORY_CARE_PROVIDER_SITE_OTHER): Payer: Medicaid Other | Admitting: Internal Medicine

## 2013-10-30 ENCOUNTER — Encounter: Payer: Self-pay | Admitting: *Deleted

## 2013-10-30 ENCOUNTER — Encounter: Payer: Self-pay | Admitting: Internal Medicine

## 2013-10-30 VITALS — BP 104/62 | HR 60 | Ht 65.0 in | Wt 166.2 lb

## 2013-10-30 DIAGNOSIS — I5022 Chronic systolic (congestive) heart failure: Secondary | ICD-10-CM

## 2013-10-30 DIAGNOSIS — I429 Cardiomyopathy, unspecified: Secondary | ICD-10-CM | POA: Diagnosis not present

## 2013-10-30 DIAGNOSIS — I428 Other cardiomyopathies: Secondary | ICD-10-CM

## 2013-10-30 NOTE — Progress Notes (Signed)
    Patient Care Team: Deepak Advani, MD as PCP - General (Internal Medicine)   HPI  [name] is a 62 y.o. female Seen following hospital consultation regarding loop recorder placement for cryptogenic stroke. It was noted at that time that she had presented with acute heart failure in July 2015. and was found to have an ejection fraction 20-25%. Catheterization demonstrated no coronary disease. She is also noted to have left bundle branch block. Medication up titration was limited by hypotension. It was felt that CRT would be appropriate if her LV function remains depressed.    Past Medical History  Diagnosis Date  . GERD (gastroesophageal reflux disease)   . Arthritis   . PONV (postoperative nausea and vomiting)   . Systolic CHF     a. Dx 07/2013 - due to NICM. Cath normal cors. EF 25%.  . LBBB (left bundle branch block)   . Hypotension   . Mitral regurgitation     a. Mild-mod MR by echo 07/2013.  . Stroke     Past Surgical History  Procedure Laterality Date  . Tonsillectomy    . Knee arthroscopy      rt  . Wrist arthroscopy  12/21/2010    Procedure: ARTHROSCOPY WRIST;  Surgeon: Gary R Kuzma, MD;  Location: [facility];  Service: Orthopedics;  Laterality: Right;  right wrist, repair TFCC on right, open reconstruction ECU sheath  . Tee without cardioversion N/A 09/26/2013    Procedure: TRANSESOPHAGEAL ECHOCARDIOGRAM (TEE);  Surgeon: Dalton S McLean, MD;  Location: [facility] ENDOSCOPY;  Service: Cardiovascular;  Laterality: N/A;    Current Outpatient Prescriptions  Medication Sig Dispense Refill  . atorvastatin (LIPITOR) 40 MG tablet Take 1 tablet (40 mg total) by mouth daily at 6 PM.  30 tablet  0  . carvedilol (COREG) 3.125 MG tablet Take 1 tablet (3.125 mg total) by mouth 2 (two) times daily with a meal.  60 tablet  6  . clopidogrel (PLAVIX) 75 MG tablet Take 1 tablet (75 mg total) by mouth daily.  30 tablet  2  . furosemide (LASIX) 20 MG tablet Take 20 mg  by mouth daily as needed for fluid.      . lisinopril (PRINIVIL,ZESTRIL) 2.5 MG tablet Take 0.625 mg by mouth daily.       No current facility-administered medications for this visit.    Allergies  Allergen Reactions  . Onion Other (See Comments)    gas  . Tomato Other (See Comments)    reflux    Review of Systems negative except from HPI and PMH  Physical Exam BP 104/62  Pulse 60  Ht 5' 5" (1.651 m)  Wt 166 lb 3.2 oz (75.388 kg)  BMI 27.66 kg/m2 Well developed and well nourished in no acute distress HENT normal E scleral and icterus clear Neck Supple JVP flat; carotids brisk and full Clear to ausculation  Regular rate and rhythm, no murmurs gallops or rub Soft with active bowel sounds No clubbing cyanosis  Edema Alert and oriented, mild residual weakness Skin Warm and Dry  ECG demonstrates sinus rhythm at 60 with intervals 13/13/43 Left bundle branch block  Assessment and  Plan   Nonischemic cardio myopathy  Congestive heart failure-chronic systolic  Left bundle branch block  Prior stroke with residual weakness   She is appropriately considered for CRT-D in the event that her heart muscle function has not demonstrated recovery since the middle of September. That would be greater than 3 months.   We discussed potential benefits as well as risks including but not limited to inappropriate shocks heart failure lead dislodgment and puncture. She understands and is willing to proceed.  Interim we'll begin her on Aldactone in hopes that we can address her floor without her need for a potassium wASTING diuretic

## 2013-10-30 NOTE — Patient Instructions (Signed)
Your physician recommends that you continue on your current medications as directed. Please refer to the Current Medication list given to you today.  Your physician has requested that you have an echocardiogram. Echocardiography is a painless test that uses sound waves to create images of your heart. It provides your doctor with information about the size and shape of your heart and how well your heart's chambers and valves are working. This procedure takes approximately one hour. There are no restrictions for this procedure.  You are scheduled for Monday 10/19 at 10:30 a.m.  Please call and speak with Roanna Raider, RN if you cannot make this echocardiogram appointment.  [phone]

## 2013-10-31 ENCOUNTER — Telehealth: Payer: Self-pay | Admitting: *Deleted

## 2013-10-31 DIAGNOSIS — Z01812 Encounter for preprocedural laboratory examination: Secondary | ICD-10-CM

## 2013-10-31 DIAGNOSIS — I5022 Chronic systolic (congestive) heart failure: Secondary | ICD-10-CM

## 2013-10-31 NOTE — Telephone Encounter (Signed)
Spoke with patient about pre procedure lab work on 10/19 when she is in the office for echo. Scheduled wound check for 11/2. Letter of instructions left at front desk for patient pick up. Patient verbalized understanding and agreeable to plan.

## 2013-11-03 ENCOUNTER — Ambulatory Visit (HOSPITAL_COMMUNITY): Payer: Medicaid Other | Attending: Cardiology | Admitting: Radiology

## 2013-11-03 ENCOUNTER — Other Ambulatory Visit (INDEPENDENT_AMBULATORY_CARE_PROVIDER_SITE_OTHER): Payer: Medicaid Other | Admitting: *Deleted

## 2013-11-03 DIAGNOSIS — I5022 Chronic systolic (congestive) heart failure: Secondary | ICD-10-CM

## 2013-11-03 DIAGNOSIS — I429 Cardiomyopathy, unspecified: Secondary | ICD-10-CM | POA: Diagnosis not present

## 2013-11-03 DIAGNOSIS — Z01812 Encounter for preprocedural laboratory examination: Secondary | ICD-10-CM

## 2013-11-03 DIAGNOSIS — I428 Other cardiomyopathies: Secondary | ICD-10-CM

## 2013-11-03 LAB — BASIC METABOLIC PANEL
BUN: 18 mg/dL (ref 6–23)
CO2: 26 meq/L (ref 19–32)
Calcium: 9.2 mg/dL (ref 8.4–10.5)
Chloride: 105 mEq/L (ref 96–112)
Creatinine, Ser: 0.8 mg/dL (ref 0.4–1.2)
GFR: 78.82 mL/min (ref >60.00)
Glucose, Bld: 90 mg/dL (ref 70–99)
Potassium: 3.8 mEq/L (ref 3.5–5.1)
Sodium: 139 mEq/L (ref 135–145)

## 2013-11-03 LAB — CBC WITH DIFFERENTIAL/PLATELET
BASOS ABS: 0 10*3/uL (ref 0.0–0.1)
Basophils Relative: 0.3 % (ref 0.0–3.0)
Eosinophils Absolute: 0.3 10*3/uL (ref 0.0–0.7)
Eosinophils Relative: 4.3 % (ref 0.0–5.0)
HCT: 38.8 % (ref 36.0–46.0)
Hemoglobin: 13.2 g/dL (ref 12.0–15.0)
Lymphocytes Relative: 22.5 % (ref 12.0–46.0)
Lymphs Abs: 1.8 10*3/uL (ref 0.7–4.0)
MCHC: 34 g/dL (ref 30.0–36.0)
MCV: 87.6 fl (ref 78.0–100.0)
MONO ABS: 0.5 10*3/uL (ref 0.1–1.0)
Monocytes Relative: 6.7 % (ref 3.0–12.0)
NEUTROS PCT: 66.2 % (ref 43.0–77.0)
Neutro Abs: 5.3 10*3/uL (ref 1.4–7.7)
PLATELETS: 219 10*3/uL (ref 150.0–400.0)
RBC: 4.44 Mil/uL (ref 3.87–5.11)
RDW: 14.8 % (ref 11.5–15.5)
WBC: 7.9 10*3/uL (ref 4.0–10.5)

## 2013-11-03 NOTE — Progress Notes (Signed)
Echocardiogram performed.  

## 2013-11-04 DIAGNOSIS — K219 Gastro-esophageal reflux disease without esophagitis: Secondary | ICD-10-CM | POA: Diagnosis not present

## 2013-11-04 DIAGNOSIS — I5022 Chronic systolic (congestive) heart failure: Secondary | ICD-10-CM | POA: Diagnosis not present

## 2013-11-04 DIAGNOSIS — Z79899 Other long term (current) drug therapy: Secondary | ICD-10-CM | POA: Diagnosis not present

## 2013-11-04 DIAGNOSIS — I340 Nonrheumatic mitral (valve) insufficiency: Secondary | ICD-10-CM | POA: Diagnosis not present

## 2013-11-04 DIAGNOSIS — I509 Heart failure, unspecified: Secondary | ICD-10-CM | POA: Diagnosis present

## 2013-11-04 DIAGNOSIS — I447 Left bundle-branch block, unspecified: Secondary | ICD-10-CM | POA: Diagnosis not present

## 2013-11-04 DIAGNOSIS — E785 Hyperlipidemia, unspecified: Secondary | ICD-10-CM | POA: Diagnosis not present

## 2013-11-04 DIAGNOSIS — I429 Cardiomyopathy, unspecified: Secondary | ICD-10-CM | POA: Diagnosis not present

## 2013-11-04 DIAGNOSIS — Z8673 Personal history of transient ischemic attack (TIA), and cerebral infarction without residual deficits: Secondary | ICD-10-CM | POA: Diagnosis not present

## 2013-11-04 DIAGNOSIS — Z7902 Long term (current) use of antithrombotics/antiplatelets: Secondary | ICD-10-CM | POA: Diagnosis not present

## 2013-11-04 MED ORDER — CHLORHEXIDINE GLUCONATE 4 % EX LIQD
60.0000 mL | Freq: Once | CUTANEOUS | Status: DC
  Filled 2013-11-04: qty 60

## 2013-11-04 MED ORDER — SODIUM CHLORIDE 0.9 % IV SOLN
INTRAVENOUS | Status: DC
  Administered 2013-11-05: 07:00:00 via INTRAVENOUS

## 2013-11-04 MED ORDER — SODIUM CHLORIDE 0.9 % IR SOLN
80.0000 mg | Status: DC
  Filled 2013-11-04: qty 2

## 2013-11-04 MED ORDER — CEFAZOLIN SODIUM-DEXTROSE 2-3 GM-% IV SOLR: 2.0000 g | INTRAVENOUS | Status: DC

## 2013-11-05 ENCOUNTER — Encounter (HOSPITAL_COMMUNITY): Payer: Self-pay | Admitting: General Practice

## 2013-11-05 ENCOUNTER — Ambulatory Visit (HOSPITAL_COMMUNITY)
Admission: RE | Admit: 2013-11-05 | Discharge: 2013-11-06 | Disposition: A | Discharge status: Home / Self Care | Payer: Medicaid Other | Source: Ambulatory Visit | Attending: Internal Medicine | Admitting: Internal Medicine

## 2013-11-05 ENCOUNTER — Encounter (HOSPITAL_COMMUNITY)
Admission: RE | Disposition: A | Discharge status: Home / Self Care | Payer: Self-pay | Source: Ambulatory Visit | Attending: Internal Medicine

## 2013-11-05 DIAGNOSIS — Z79899 Other long term (current) drug therapy: Secondary | ICD-10-CM | POA: Insufficient documentation

## 2013-11-05 DIAGNOSIS — I447 Left bundle-branch block, unspecified: Secondary | ICD-10-CM | POA: Insufficient documentation

## 2013-11-05 DIAGNOSIS — I428 Other cardiomyopathies: Secondary | ICD-10-CM

## 2013-11-05 DIAGNOSIS — Z95810 Presence of automatic (implantable) cardiac defibrillator: Secondary | ICD-10-CM

## 2013-11-05 DIAGNOSIS — I429 Cardiomyopathy, unspecified: Secondary | ICD-10-CM | POA: Insufficient documentation

## 2013-11-05 DIAGNOSIS — I340 Nonrheumatic mitral (valve) insufficiency: Secondary | ICD-10-CM | POA: Insufficient documentation

## 2013-11-05 DIAGNOSIS — K219 Gastro-esophageal reflux disease without esophagitis: Secondary | ICD-10-CM | POA: Diagnosis present

## 2013-11-05 DIAGNOSIS — I5022 Chronic systolic (congestive) heart failure: Secondary | ICD-10-CM | POA: Diagnosis present

## 2013-11-05 DIAGNOSIS — I639 Cerebral infarction, unspecified: Secondary | ICD-10-CM | POA: Diagnosis present

## 2013-11-05 DIAGNOSIS — Z8673 Personal history of transient ischemic attack (TIA), and cerebral infarction without residual deficits: Secondary | ICD-10-CM | POA: Insufficient documentation

## 2013-11-05 DIAGNOSIS — Z7902 Long term (current) use of antithrombotics/antiplatelets: Secondary | ICD-10-CM | POA: Insufficient documentation

## 2013-11-05 DIAGNOSIS — E785 Hyperlipidemia, unspecified: Secondary | ICD-10-CM | POA: Insufficient documentation

## 2013-11-05 HISTORY — DX: Hyperlipidemia, unspecified: E78.5

## 2013-11-05 HISTORY — DX: Presence of automatic (implantable) cardiac defibrillator: Z95.810

## 2013-11-05 HISTORY — PX: BI-VENTRICULAR IMPLANTABLE CARDIOVERTER DEFIBRILLATOR  (CRT-D): SHX5747

## 2013-11-05 HISTORY — PX: BI-VENTRICULAR IMPLANTABLE CARDIOVERTER DEFIBRILLATOR: SHX5459

## 2013-11-05 LAB — SURGICAL PCR SCREEN
MRSA, PCR: NEGATIVE
Staphylococcus aureus: POSITIVE — AB

## 2013-11-05 SURGERY — BI-VENTRICULAR IMPLANTABLE CARDIOVERTER DEFIBRILLATOR  (CRT-D)
Anesthesia: LOCAL

## 2013-11-05 MED ORDER — MUPIROCIN 2 % EX OINT
TOPICAL_OINTMENT | CUTANEOUS | Status: AC
  Filled 2013-11-05: qty 22

## 2013-11-05 MED ORDER — ATORVASTATIN CALCIUM 40 MG PO TABS: 40.0000 mg | ORAL_TABLET | Freq: Every day | ORAL | Status: DC

## 2013-11-05 MED ORDER — MIDAZOLAM HCL 5 MG/5ML IJ SOLN
INTRAMUSCULAR | Status: AC
  Filled 2013-11-05: qty 5

## 2013-11-05 MED ORDER — MUPIROCIN 2 % EX OINT
TOPICAL_OINTMENT | Freq: Two times a day (BID) | CUTANEOUS | Status: DC
  Administered 2013-11-05: 1 via NASAL
  Administered 2013-11-05 – 2013-11-06 (×2): via NASAL

## 2013-11-05 MED ORDER — HEPARIN (PORCINE) IN NACL 2-0.9 UNIT/ML-% IJ SOLN
INTRAMUSCULAR | Status: AC
  Filled 2013-11-05: qty 500

## 2013-11-05 MED ORDER — INFLUENZA VAC SPLIT QUAD 0.5 ML IM SUSY
0.5000 mL | PREFILLED_SYRINGE | INTRAMUSCULAR | Status: AC
  Administered 2013-11-06: 0.5 mL via INTRAMUSCULAR
  Filled 2013-11-05: qty 0.5

## 2013-11-05 MED ORDER — LIDOCAINE HCL (PF) 1 % IJ SOLN
INTRAMUSCULAR | Status: AC
  Filled 2013-11-05: qty 60

## 2013-11-05 MED ORDER — SODIUM CHLORIDE 0.9 % IV SOLN
INTRAVENOUS | Status: AC
  Administered 2013-11-05: 13:00:00 via INTRAVENOUS

## 2013-11-05 MED ORDER — CARVEDILOL 3.125 MG PO TABS
3.1250 mg | ORAL_TABLET | Freq: Two times a day (BID) | ORAL | Status: DC
  Administered 2013-11-05 – 2013-11-06 (×2): 3.125 mg via ORAL
  Filled 2013-11-05: qty 1

## 2013-11-05 MED ORDER — ACETAMINOPHEN 325 MG PO TABS
325.0000 mg | ORAL_TABLET | ORAL | Status: DC | PRN
  Administered 2013-11-05 – 2013-11-06 (×2): 650 mg via ORAL
  Filled 2013-11-05 (×2): qty 2

## 2013-11-05 MED ORDER — CEFAZOLIN SODIUM-DEXTROSE 2-3 GM-% IV SOLR
INTRAVENOUS | Status: AC
  Filled 2013-11-05: qty 50

## 2013-11-05 MED ORDER — CEFAZOLIN SODIUM 1-5 GM-% IV SOLN
1.0000 g | Freq: Four times a day (QID) | INTRAVENOUS | Status: AC
  Administered 2013-11-05 – 2013-11-06 (×3): 1 g via INTRAVENOUS
  Filled 2013-11-05 (×3): qty 50

## 2013-11-05 MED ORDER — CLOPIDOGREL BISULFATE 75 MG PO TABS
75.0000 mg | ORAL_TABLET | Freq: Every day | ORAL | Status: DC
  Administered 2013-11-06: 75 mg via ORAL
  Filled 2013-11-05: qty 1

## 2013-11-05 MED ORDER — FENTANYL CITRATE 0.05 MG/ML IJ SOLN
INTRAMUSCULAR | Status: AC
  Filled 2013-11-05: qty 2

## 2013-11-05 MED ORDER — LIDOCAINE HCL (PF) 1 % IJ SOLN
INTRAMUSCULAR | Status: AC
  Filled 2013-11-05: qty 30

## 2013-11-05 MED ORDER — ONDANSETRON HCL 4 MG/2ML IJ SOLN: 4.0000 mg | Freq: Four times a day (QID) | INTRAMUSCULAR | Status: DC | PRN

## 2013-11-05 MED ORDER — LISINOPRIL 2.5 MG PO TABS: 1.2500 mg | ORAL_TABLET | Freq: Every day | ORAL | Status: DC

## 2013-11-05 MED ORDER — FUROSEMIDE 20 MG PO TABS: 20.0000 mg | ORAL_TABLET | Freq: Every day | ORAL | Status: DC | PRN

## 2013-11-05 NOTE — Progress Notes (Signed)
UR Completed Sanayah Munro Graves-Bigelow, RN,BSN [phone]  

## 2013-11-05 NOTE — Op Note (Signed)
NAMEANYAH, MORALEZ NO.:  [id number]  MEDICAL RECORD NO.:  [id number]  LOCATION:  [facility]                         FACILITY:  [facility]  PHYSICIAN:  Duke Salvia, MD, FACCDATE OF BIRTH:  1950-06-03  DATE OF PROCEDURE:  11/05/2013 DATE OF DISCHARGE:                              OPERATIVE REPORT   PREOPERATIVE DIAGNOSES:  Nonischemic cardiomyopathy and congestive heart failure with left bundle-branch block.  POSTOPERATIVE DIAGNOSES:  Nonischemic cardiomyopathy and congestive heart failure with left bundle-branch block.  PROCEDURES:  Dual-chamber defibrillator implantation with left ventricular lead placement and high voltage lead assessment.  DESCRIPTION OF PROCEDURE:  Following obtaining informed consent, the patient was brought to the electrophysiology laboratory and placed on the fluoroscopic table in supine position.  After routine prep and drape of the left upper chest, lidocaine was infiltrated in the prepectoral subclavicular region.  Incision was made and carried down to layer of the prepectoral fascia using electrocautery and sharp dissection.  A pocket was formed similarly.  Hemostasis was obtained.  Thereafter, attention was turned to gain access to the extrathoracic left subclavian vein, which was accomplished without difficulty without the aspiration or puncture of the artery.  Three separate venipunctures were accomplished.  Guidewires were placed and retained and sequentially a 9, 9.5 and 7-French sheaths were placed, which were passed a Medtronic 6935M single coil defibrillator lead, serial number [id number], a St. Jude 135 CS cannulation sheath and a Medtronic 5076 45-cm active fixation atrial lead, serial number [id number].  Under fluoroscopic guidance, the RV lead was manipulated to the apex where the bipolar R-wave was 6.1 with a pace impedance of 670, a threshold of 0.5 V at 0.5 milliseconds.  Current of threshold 0.8 mA and there was no  diaphragmatic pacing at 10 V.  Current of injury was brisk. This lead was secured to the prepectoral fascia.  We then were able to get a guidewire into the coronary sinus without difficulty.  However, on attempts to pass a St. Jude 135 sheath, I ended up dissecting the proximal coronary sinus.  We ended up removing the sheath, putting a Medtronic MB2.  This sheath also seemed to catch at the os of the coronary sinus.  We placed a second Wholey wire and over the double wire, we were able to pass the sheath into the midportion of the coronary sinus.  Contrast venography demonstrated no lateral branches.  We then went exploring with the wire and found a high anterolateral branch into which we deployed a Medtronic 4298 88-cm lead, serial number [id number].  It was deployed to the junction between the mid and distal third.  There was diaphragmatic stimulation across all RV coil configuration, but with the 2.3 configuration, the LV amplitude was 1.4 mV with a pace impedance of 708, a threshold of 1 V at 0.5 milliseconds.  Current of threshold 1.6 mA.  There was no diaphragmatic pacing at 10 V.  The 9.5-French sheath had been removed previously at the removal of the 135 St. Jude sheath.  At this point, the RA lead was deployed to the right atrial appendage where the bipolar P-wave was 5.6 with a pace impedance of 1027, threshold of 1.1 V  at 0.5 milliseconds. Current of threshold 1.5 mA and there was no diaphragmatic pacing at 10 V.  The current of injury was brisk.  This lead was secured to the prepectoral fascia.  The CS deployment sheath was then removed and the CS lead was secured to the prepectoral fascia with fluoroscopic stability having been demonstrated.  The Leads were then attached to Medtronic Viva ICD, serial number [id number].  Through the device, the bipolar R-wave was 10.6 with a pace impedance of 513, a threshold of 0.5 V at 0.4 milliseconds.  The P-wave was 3.9 with a pace  impedance of 665 with a threshold of 1 V at 0.4 milliseconds and the LV impedance was 702 with a threshold of 1.5 V at 0.4 milliseconds.  High-voltage impedance was 86 ohms.  The pocket was copiously irrigated with antibiotic containing saline solution. Hemostasis was assured.  Surgicel was placed on the posterior, the anterior and cephalad aspect of the pocket.  The device and the leads were placed in the pocket secured to the prepectoral fascia.  The wound was then closed in 2 layers in normal fashion.  The wound was washed, dried, and a benzoin, Steri-Strip dressing was applied.  Needle counts, sponge counts, and instrument counts were correct at the end of procedure according to the staff.  The patient tolerated the procedure without apparent complication.     Duke SalviaSteven C. Klein, MD, [facility] CenterFACC     SCK/MEDQ  D:  11/05/2013  T:  11/05/2013  Job:  [id number]

## 2013-11-05 NOTE — Interval H&P Note (Signed)
ICD Criteria  Current LVEF:25% ;Obtained < 1 month ago.  NYHA Functional Classification: Class II  Heart Failure History:  Yes, Duration of heart failure since onset is 3 to 9 months  Non-Ischemic Dilated Cardiomyopathy History:  Yes, timeframe is 3 to 9 months  Atrial Fibrillation/Atrial Flutter:  No.  Ventricular Tachycardia History:  No.  Cardiac Arrest History:  No  History of Syndromes with Risk of Sudden Death:  No.  Previous ICD:  No.  Electrophysiology Study: No.  Prior MI: No.  PPM: No.  OSA:  No  Patient Life Expectancy of >=1 year: Yes.  Anticoagulation Therapy:  Patient is not on  anticoagulation therapy  Beta Blocker Therapy:  Yes.   Ace Inhibitor/ARB Therapy:  Yes.History and Physical Interval Note:  11/05/2013 7:41 AM  [name]  has presented today for surgery, with the diagnosis of chs  The various methods of treatment have been discussed with the patient and family. After consideration of risks, benefits and other options for treatment, the patient has consented to  Procedure(s): BI-VENTRICULAR IMPLANTABLE CARDIOVERTER DEFIBRILLATOR  (CRT-D) (N/A) as a surgical intervention .  The patient's history has been reviewed, patient examined, no change in status, stable for surgery.  I have reviewed the patient's chart and labs.  Questions were answered to the patient's satisfaction.     Sherryl Manges

## 2013-11-05 NOTE — CV Procedure (Signed)
Nimco Mchenry Real [id number]  [id number]  Preop LE:XNTZ CHF LBBB Postop Dx same/   Procedure: Dual chamber ICD implant with LV lead placement and HV lead assessment Cx: CS dissection without hemodynamic effect  EBL: Minimal    Dictation number  [id number]  Sherryl Manges, MD 11/05/2013 10:39 AM

## 2013-11-05 NOTE — Discharge Summary (Signed)
Discharge Summary   Patient ID: [name] MRN: [id number], DOB/AGE: [birth year] 65 y.o. Admit date: 11/05/2013 D/C date:     11/06/2013  Primary Cardiologist: Dr. Graciela Husbands   Principal Problem:   Non-ischemic cardiomyopathy Active Problems:   Left bundle branch block   Mitral regurgitation   Chronic systolic congestive heart failure, NYHA class 2   Stroke   GERD (gastroesophageal reflux disease)   HLD (hyperlipidemia)    Admission Dates: 11/05/13-11/06/13 Discharge Diagnosis: NICM and LBBB s/p dual chamber ICD implant with LV lead placement and HV lead assessment.  Device implanted: Medtronic Viva ICD, serial number [id number].    HPI: [name] is a 63 y.o. female with a history of GERD, MR, NICM/ chronic systolic CHF (EF 25%), LBBB, and previous CVA who presented to [facility] today for implantation of a dual chamber ICD implant with LV lead placement and HV lead assessment.  She was initiailly seen following hospital consultation regarding loop recorder placement for cryptogenic stroke. It was noted at that time that she had presented with acute heart failure in July 2015 and was found to have an ejection fraction 20-25%. Catheterization demonstrated no coronary disease. She is also noted to have left bundle branch block. Medication up titration was limited by hypotension. It was felt that CRT would be appropriate if her LV function remained depressed.  Hospital Course  NICM/ chronic systolic CHF (EF 36%):  -- Repeat ECHO on 11/03/13 with EF 25-30%, which was improved from 15% in 09/2013. There was also diffuse Hypokinesis, dyskinesis of the anteroseptal myocardium, and akinesis of the basal-midinferior myocardium. G1DD. Mild AR, mild MR. Mild LA dilation. Her EF remained under <35% and CRT was felt to be indicated.  -- She underwent CRT-D placement on 11/05/13 -- Continue lisinopril 1.25mg  qd and Coreg 3.125mg  BID. -- Per Dr. Graciela Husbands, planned to start her on aldactone, but  not yet started. Will defer this to outpatient follow up in CHF clinic  Previous CVA: -- Continue statin and plavix  Mitral regurgitation  -- Mild on repeat ECHO this admission    The patient has had an uncomplicated hospital course and is recovering well. She has been seen by Dr. Graciela Husbands today and deemed ready for discharge home. All follow-up appointments have been scheduled. Discharge medications are listed below. CXR with stable leads and device interrogation this morning revealed the device was functioning normally.    Discharge Vitals: Blood pressure 121/76, pulse 81, temperature 98 F (36.7 C), temperature source Oral, resp. rate 18, height  (1.651 m), weight 168 lb 1.6 oz (76.25 kg), SpO2 100.00%.  Labs: Lab Results  Component Value Date   WBC 7.9 11/03/2013   HGB 13.2 11/03/2013   HCT 38.8 11/03/2013   MCV 87.6 11/03/2013   PLT 219.0 11/03/2013     Recent Labs Lab 11/03/13 1006  NA 139  K 3.8  CL 105  CO2 26  BUN 18  CREATININE 0.8  CALCIUM 9.2  GLUCOSE 90    Lab Results  Component Value Date   CHOL 166 09/23/2013   HDL 41 09/23/2013   LDLCALC 644* 09/23/2013   TRIG 83 09/23/2013     Diagnostic Studies/Procedures    DATE OF PROCEDURE: 11/05/2013  OPERATIVE REPORT  PREOPERATIVE DIAGNOSES: Nonischemic cardiomyopathy and congestive heart  failure with left bundle-branch block.  POSTOPERATIVE DIAGNOSES: Nonischemic cardiomyopathy and congestive  heart failure with left bundle-branch block.  PROCEDURES: Dual-chamber defibrillator implantation with left  ventricular lead placement and high voltage  lead assessment.  DESCRIPTION OF PROCEDURE: Following obtaining informed consent, the  patient was brought to the electrophysiology laboratory and placed on  the fluoroscopic table in supine position. After routine prep and drape  of the left upper chest, lidocaine was infiltrated in the prepectoral  subclavicular region. Incision was made and carried down to  layer of  the prepectoral fascia using electrocautery and sharp dissection. A  pocket was formed similarly. Hemostasis was obtained.  Thereafter, attention was turned to gain access to the extrathoracic  left subclavian vein, which was accomplished without difficulty without  the aspiration or puncture of the artery. Three separate venipunctures  were accomplished. Guidewires were placed and retained and sequentially  a 9, 9.5 and 7-French sheaths were placed, which were passed a Medtronic  6935M single coil defibrillator lead, serial number [id number], a St.  Jude 135 CS cannulation sheath and a Medtronic 5076 45-cm active  fixation atrial lead, serial number [id number].  Under fluoroscopic guidance, the RV lead was manipulated to the apex  where the bipolar R-wave was 6.1 with a pace impedance of 670, a  threshold of 0.5 V at 0.5 milliseconds. Current of threshold 0.8 mA and  there was no diaphragmatic pacing at 10 V. Current of injury was brisk.  This lead was secured to the prepectoral fascia.  We then were able to get a guidewire into the coronary sinus without  difficulty. However, on attempts to pass a St. Jude 135 sheath, I ended  up dissecting the proximal coronary sinus. We ended up removing the  sheath, putting a Medtronic MB2. This sheath also seemed to catch at  the os of the coronary sinus. We placed a second Wholey wire and over  the double wire, we were able to pass the sheath into the midportion of  the coronary sinus. Contrast venography demonstrated no lateral  branches. We then went exploring with the wire and found a high  anterolateral branch into which we deployed a Medtronic 4298 88-cm lead,  serial number [id number]. It was deployed to the junction between the  mid and distal third. There was diaphragmatic stimulation across all RV  coil configuration, but with the 2.3 configuration, the LV amplitude was  1.4 mV with a pace impedance of 708, a threshold of 1 V at  0.5  milliseconds. Current of threshold 1.6 mA. There was no diaphragmatic  pacing at 10 V. The 9.5-French sheath had been removed previously at  the removal of the 135 St. Jude sheath. At this point, the RA lead was  deployed to the right atrial appendage where the bipolar P-wave was 5.6  with a pace impedance of 1027, threshold of 1.1 V at 0.5 milliseconds.  Current of threshold 1.5 mA and there was no diaphragmatic pacing at 10  V. The current of injury was brisk. This lead was secured to the  prepectoral fascia.  The CS deployment sheath was then removed and the CS lead was secured to  the prepectoral fascia with fluoroscopic stability having been  demonstrated.  The Leads were then attached to Medtronic Viva ICD, serial number  [id number]. Through the device, the bipolar R-wave was 10.6 with a pace  impedance of 513, a threshold of 0.5 V at 0.4 milliseconds. The P-wave  was 3.9 with a pace impedance of 665 with a threshold of 1 V at 0.4  milliseconds and the LV impedance was 702 with a threshold of 1.5 V at  0.4 milliseconds. High-voltage impedance was 86 ohms. The  pocket was  copiously irrigated with antibiotic containing saline solution.  Hemostasis was assured. Surgicel was placed on the posterior, the  anterior and cephalad aspect of the pocket. The device and the leads  were placed in the pocket secured to the prepectoral fascia. The wound  was then closed in 2 layers in normal fashion. The wound was washed,  dried, and a benzoin, Steri-Strip dressing was applied. Needle counts,  sponge counts, and instrument counts were correct at the end of  procedure according to the staff. The patient tolerated the procedure  without apparent complication.  Duke SalviaSteven C. Klein, MD, [facility] HospitalFACC     2D ECHO 11/03/2013 LV EF: 25% - 30% Study Conclusions - Left ventricle: The cavity size was normal. Wall thickness was normal. Systolic function was severely reduced. The estimated ejection fraction  was in the range of 25% to 30%. Diffuse hypokinesis. There is dyskinesis of the anteroseptal myocardium. There is akinesis of the basal-midinferior myocardium. Doppler parameters are consistent with abnormal left ventricular relaxation (grade 1 diastolic dysfunction). - Aortic valve: There was mild regurgitation. - Mitral valve: Calcified annulus. Mildly thickened, mildly calcified leaflets . There was mild regurgitation. - Left atrium: The atrium was mildly dilated. Impressions: - When compared to prior study, EF has improved.     Discharge Medications     Medication List         atorvastatin 40 MG tablet  Commonly known as:  LIPITOR  Take 1 tablet (40 mg total) by mouth daily at 6 PM.     carvedilol 3.125 MG tablet  Commonly known as:  COREG  Take 1 tablet (3.125 mg total) by mouth 2 (two) times daily with a meal.     clopidogrel 75 MG tablet  Commonly known as:  PLAVIX  Take 1 tablet (75 mg total) by mouth daily.     furosemide 20 MG tablet  Commonly known as:  LASIX  Take 20 mg by mouth daily as needed for fluid.     lisinopril 2.5 MG tablet  Commonly known as:  PRINIVIL,ZESTRIL  Take 0.625 mg by mouth daily.        Disposition   The patient will be discharged in stable condition to home.  Follow-up Information   Follow up with [facility] On 11/17/2013. (@ 4:30pm in  [address] in the device clinic for wound check in the device clinic)    Contact information:   [address] [place] [id number] [phone]      [facility] On 11/20/2013. (@ 10:15 am)    Contact information:   [address] [id number] [place] [address] [phone]        Duration of Discharge Encounter: Greater than 30 minutes including physician and PA time.  SignedJanee Morn, Vessie Olmsted R PA-C 11/06/2013, 11:07 AM

## 2013-11-05 NOTE — H&P (View-Only) (Signed)
Patient Care Team: Doris Cheadleeepak Advani, MD as PCP - General (Internal Medicine)   HPI  [name] is a 63 y.o. female Seen following hospital consultation regarding loop recorder placement for cryptogenic stroke. It was noted at that time that she had presented with acute heart failure in July 2015. and was found to have an ejection fraction 20-25%. Catheterization demonstrated no coronary disease. She is also noted to have left bundle branch block. Medication up titration was limited by hypotension. It was felt that CRT would be appropriate if her LV function remains depressed.    Past Medical History  Diagnosis Date  . GERD (gastroesophageal reflux disease)   . Arthritis   . PONV (postoperative nausea and vomiting)   . Systolic CHF     a. Dx 07/2013 - due to NICM. Cath normal cors. EF 25%.  [name]. LBBB (left bundle branch block)   . Hypotension   . Mitral regurgitation     a. Mild-mod MR by echo 07/2013.  . Stroke     Past Surgical History  Procedure Laterality Date  . Tonsillectomy    . Knee arthroscopy      rt  . Wrist arthroscopy  12/21/2010    Procedure: ARTHROSCOPY WRIST;  Surgeon: Nicki ReaperGary R Kuzma, MD;  Location: [facility];  Service: Orthopedics;  Laterality: Right;  right wrist, repair TFCC on right, open reconstruction ECU sheath  . Tee without cardioversion N/A 09/26/2013    Procedure: TRANSESOPHAGEAL ECHOCARDIOGRAM (TEE);  Surgeon: Laurey Moralealton S McLean, MD;  [facility];  Service: Cardiovascular;  Laterality: N/A;    Current Outpatient Prescriptions  Medication Sig Dispense Refill  . atorvastatin (LIPITOR) 40 MG tablet Take 1 tablet (40 mg total) by mouth daily at 6 PM.  30 tablet  0  . carvedilol (COREG) 3.125 MG tablet Take 1 tablet (3.125 mg total) by mouth 2 (two) times daily with a meal.  60 tablet  6  . clopidogrel (PLAVIX) 75 MG tablet Take 1 tablet (75 mg total) by mouth daily.  30 tablet  2  . furosemide (LASIX) 20 MG tablet Take 20 mg  by mouth daily as needed for fluid.      [name]. lisinopril (PRINIVIL,ZESTRIL) 2.5 MG tablet Take 0.625 mg by mouth daily.       No current facility-administered medications for this visit.    Allergies  Allergen Reactions  . Onion Other (See Comments)    gas  . Tomato Other (See Comments)    reflux    Review of Systems negative except from HPI and PMH  Physical Exam BP 104/62  Pulse 60  Ht 5\' 5"  (1.651 m)  Wt 166 lb 3.2 oz (75.388 kg)  BMI 27.66 kg/m2 Well developed and well nourished in no acute distress HENT normal E scleral and icterus clear Neck Supple JVP flat; carotids brisk and full Clear to ausculation  Regular rate and rhythm, no murmurs gallops or rub Soft with active bowel sounds No clubbing cyanosis  Edema Alert and oriented, mild residual weakness Skin Warm and Dry  ECG demonstrates sinus rhythm at 60 with intervals 13/13/43 Left bundle branch block  Assessment and  Plan   Nonischemic cardio myopathy  Congestive heart failure-chronic systolic  Left bundle branch block  Prior stroke with residual weakness   She is appropriately considered for CRT-D in the event that her heart muscle function has not demonstrated recovery since the middle of September. That would be greater than 3 months.  We discussed potential benefits as well as risks including but not limited to inappropriate shocks heart failure lead dislodgment and puncture. She understands and is willing to proceed.  Interim we'll begin her on Aldactone in hopes that we can address her floor without her need for a potassium wASTING diuretic

## 2013-11-06 ENCOUNTER — Ambulatory Visit (HOSPITAL_COMMUNITY): Payer: Medicaid Other

## 2013-11-06 ENCOUNTER — Encounter (HOSPITAL_COMMUNITY): Payer: Self-pay | Admitting: Physician Assistant

## 2013-11-06 DIAGNOSIS — I447 Left bundle-branch block, unspecified: Secondary | ICD-10-CM | POA: Diagnosis not present

## 2013-11-06 DIAGNOSIS — K219 Gastro-esophageal reflux disease without esophagitis: Secondary | ICD-10-CM | POA: Diagnosis not present

## 2013-11-06 DIAGNOSIS — I429 Cardiomyopathy, unspecified: Secondary | ICD-10-CM | POA: Diagnosis not present

## 2013-11-06 DIAGNOSIS — I5022 Chronic systolic (congestive) heart failure: Secondary | ICD-10-CM | POA: Diagnosis not present

## 2013-11-06 NOTE — Progress Notes (Signed)
Pt discharged to home per MD order. Pt received and reviewed all discharge instructions and medication information including follow-up appointments and prescription information. Pt verbalized understanding. Pt alert and oriented at discharge with no complaints of pain. Pt IV and telemetry box removed prior to discharge. Pt escorted to private vehicle via wheelchair by guest services volunteer. [name]  

## 2013-11-06 NOTE — Discharge Instructions (Signed)

## 2013-11-06 NOTE — Progress Notes (Signed)
   ELECTROPHYSIOLOGY ROUNDING NOTE    Patient Name: [name] Date of Encounter: 11/06/2013    SUBJECTIVE:Patient feels well this morning.  No chest pain or shortness of breath.   TELEMETRY: Reviewed telemetry pt in sinus rhythm with ventricular pacing: Filed Vitals:   11/05/13 1215 11/05/13 1718 11/05/13 2048 11/06/13 0643  BP: 101/73 103/53 111/44 121/76  Pulse: 93 90 87 81  Temp:   98.8 F (37.1 C) 98 F (36.7 C)  TempSrc:   Oral Oral  Resp:   18 18  Height:      Weight:    168 lb 1.6 oz (76.25 kg)  SpO2:   98% 100%    Intake/Output Summary (Last 24 hours) at 11/06/13 0721 Last data filed at 11/06/13 0317  Gross per 24 hour  Intake 321.67 ml  Output    800 ml  Net -478.33 ml    CURRENT MEDICATIONS: . atorvastatin  40 mg Oral q1800  . carvedilol  3.125 mg Oral BID WC  . clopidogrel  75 mg Oral Daily  . Influenza vac split quadrivalent PF  0.5 mL Intramuscular Tomorrow-1000  . lisinopril  1.25 mg Oral Daily  . mupirocin ointment   Nasal BID    LABS: Basic Metabolic Panel:  Recent Labs  41/93/79 1006  NA 139  K 3.8  CL 105  CO2 26  GLUCOSE 90  BUN 18  CREATININE 0.8  CALCIUM 9.2   CBC:  Recent Labs  11/03/13 1006  WBC 7.9  NEUTROABS 5.3  HGB 13.2  HCT 38.8  MCV 87.6  PLT 219.0    Radiology/Studies:  Final result pending, leads in stable position. Some tension on RV lead  PHYSICAL EXAM .vs Well developed and nourished in no acute distress HENT normal Neck supple with JVP-flat Clear No hematoma Regular rate and rhythm, no murmurs or gallops Abd-soft with active BS No Clubbing cyanosis edema Skin-warm and dry A & Oriented  Grossly normal sensory and motor function   DEVICE INTERROGATION: Device interrogation pending  Principal Problem:   Non-ischemic cardiomyopathy Active Problems:   Left bundle branch block   Mitral regurgitation   Chronic systolic congestive heart failure, NYHA class 2   Stroke   GERD  (gastroesophageal reflux disease)   HLD (hyperlipidemia)    Wound care, restrictions, shock plan reviewed with patient.  Routine follow up scheduled.

## 2013-11-17 ENCOUNTER — Ambulatory Visit (INDEPENDENT_AMBULATORY_CARE_PROVIDER_SITE_OTHER): Payer: Medicaid Other | Admitting: *Deleted

## 2013-11-17 DIAGNOSIS — I428 Other cardiomyopathies: Secondary | ICD-10-CM

## 2013-11-17 DIAGNOSIS — I429 Cardiomyopathy, unspecified: Secondary | ICD-10-CM

## 2013-11-17 DIAGNOSIS — I5022 Chronic systolic (congestive) heart failure: Secondary | ICD-10-CM | POA: Diagnosis not present

## 2013-11-17 LAB — MDC_IDC_ENUM_SESS_TYPE_INCLINIC
Battery Remaining Longevity: 112 mo
Brady Statistic AS VS Percent: 1.33 %
Brady Statistic RA Percent Paced: 9.17 %
Brady Statistic RV Percent Paced: 4.56 %
HIGH POWER IMPEDANCE MEASURED VALUE: 190 Ohm
HIGH POWER IMPEDANCE MEASURED VALUE: 62 Ohm
Lead Channel Impedance Value: 456 Ohm
Lead Channel Impedance Value: 646 Ohm
Lead Channel Pacing Threshold Amplitude: 0.25 V
Lead Channel Pacing Threshold Pulse Width: 0.4 ms
Lead Channel Pacing Threshold Pulse Width: 0.5 ms
Lead Channel Sensing Intrinsic Amplitude: 12.125 mV
Lead Channel Sensing Intrinsic Amplitude: 8.625 mV
Lead Channel Setting Pacing Amplitude: 3.5 V
Lead Channel Setting Pacing Amplitude: 3.5 V
Lead Channel Setting Pacing Pulse Width: 0.4 ms
Lead Channel Setting Sensing Sensitivity: 0.3 mV
MDC IDC MSMT BATTERY VOLTAGE: 3.06 V
MDC IDC MSMT LEADCHNL LV PACING THRESHOLD AMPLITUDE: 1.125 V
MDC IDC MSMT LEADCHNL RA SENSING INTR AMPL: 4.375 mV
MDC IDC MSMT LEADCHNL RA SENSING INTR AMPL: 4.625 mV
MDC IDC MSMT LEADCHNL RV PACING THRESHOLD AMPLITUDE: 0.75 V
MDC IDC MSMT LEADCHNL RV PACING THRESHOLD PULSEWIDTH: 0.4 ms
MDC IDC SESS DTM: 20151102165852
MDC IDC SET LEADCHNL LV PACING AMPLITUDE: 2 V
MDC IDC SET LEADCHNL LV PACING PULSEWIDTH: 0.5 ms
MDC IDC SET ZONE DETECTION INTERVAL: 350 ms
MDC IDC STAT BRADY AP VP PERCENT: 8.99 %
MDC IDC STAT BRADY AP VS PERCENT: 0.17 %
MDC IDC STAT BRADY AS VP PERCENT: 89.51 %
Zone Setting Detection Interval: 250 ms
Zone Setting Detection Interval: 300 ms
Zone Setting Detection Interval: 450 ms

## 2013-11-17 NOTE — Progress Notes (Signed)
Wound check appointment. Wound without redness or edema. Incision edges approximated, wound well healed. Normal device function. Thresholds, sensing, and impedances consistent with implant measurements. Device programmed at 3.5V for extra safety margin until 3 month visit. Histogram distribution appropriate for patient and level of activity. No mode switches or ventricular arrhythmias noted. Patient educated about wound care, arm mobility, lifting restrictions, shock plan. ROV with GT on 2/2 @ 10:45am.

## 2013-11-19 ENCOUNTER — Telehealth: Payer: Self-pay | Admitting: Internal Medicine

## 2013-11-19 NOTE — Telephone Encounter (Signed)
New message     Pt recently got a pacemaker---she has questions

## 2013-11-19 NOTE — Telephone Encounter (Signed)
Answered patient's questions about her ability to wear jewelry.

## 2013-11-20 ENCOUNTER — Encounter (HOSPITAL_COMMUNITY): Payer: Self-pay

## 2013-11-20 ENCOUNTER — Ambulatory Visit (HOSPITAL_COMMUNITY)
Admission: RE | Admit: 2013-11-20 | Discharge: 2013-11-20 | Disposition: A | Discharge status: Home / Self Care | Payer: Medicaid Other | Source: Ambulatory Visit | Attending: Cardiology | Admitting: Cardiology

## 2013-11-20 VITALS — BP 129/79 | HR 71 | Resp 18 | Wt 168.5 lb

## 2013-11-20 DIAGNOSIS — I639 Cerebral infarction, unspecified: Secondary | ICD-10-CM | POA: Diagnosis not present

## 2013-11-20 DIAGNOSIS — I9589 Other hypotension: Secondary | ICD-10-CM | POA: Insufficient documentation

## 2013-11-20 DIAGNOSIS — I447 Left bundle-branch block, unspecified: Secondary | ICD-10-CM | POA: Insufficient documentation

## 2013-11-20 DIAGNOSIS — E785 Hyperlipidemia, unspecified: Secondary | ICD-10-CM | POA: Diagnosis not present

## 2013-11-20 DIAGNOSIS — I5022 Chronic systolic (congestive) heart failure: Secondary | ICD-10-CM | POA: Insufficient documentation

## 2013-11-20 LAB — BASIC METABOLIC PANEL
ANION GAP: 14 (ref 5–15)
BUN: 19 mg/dL (ref 6–23)
CALCIUM: 9.5 mg/dL (ref 8.4–10.5)
CO2: 21 meq/L (ref 19–32)
CREATININE: 0.66 mg/dL (ref 0.50–1.10)
Chloride: 103 mEq/L (ref 96–112)
GFR calc Af Amer: >90 mL/min (ref >90)
Glucose, Bld: 111 mg/dL — ABNORMAL HIGH (ref 70–99)
Potassium: 3.9 mEq/L (ref 3.7–5.3)
Sodium: 138 mEq/L (ref 137–147)

## 2013-11-20 MED ORDER — ATORVASTATIN CALCIUM 40 MG PO TABS: 40.0000 mg | ORAL_TABLET | Freq: Every day | ORAL | Status: DC

## 2013-11-20 MED ORDER — CLOPIDOGREL BISULFATE 75 MG PO TABS: 75.0000 mg | ORAL_TABLET | Freq: Every day | ORAL | Status: DC

## 2013-11-20 MED ORDER — LISINOPRIL 2.5 MG PO TABS
1.2500 mg | ORAL_TABLET | Freq: Two times a day (BID) | ORAL | Status: DC
Start: 2013-11-20 — End: 2014-01-02

## 2013-11-20 MED ORDER — CARVEDILOL 3.125 MG PO TABS
3.1250 mg | ORAL_TABLET | Freq: Two times a day (BID) | ORAL | Status: DC
Start: 2013-11-20 — End: 2014-01-02

## 2013-11-20 MED ORDER — FUROSEMIDE 20 MG PO TABS: 20.0000 mg | ORAL_TABLET | Freq: Every day | ORAL | Status: DC | PRN

## 2013-11-20 NOTE — Progress Notes (Signed)
Patient ID: [name], female   DOB: 20-Aug-1950, 63 y.o.   MRN: [id number]  PCP: Family Medicine EP: Dr Graciela HusbandsKlein.  Primary Cardiologist: Dr Shirlee LatchMcLean  Neurologist: Dr Pearlean BrownieSethi  HPI: [name] is a 63 y.o. female with chronic systolic CHF due to NICM (no significant CAD) s/p CRT-D, LBBB, mild-mod MR and CVA (09/2013).  Admitted 9/8 with right sided facial droop and expressive aphasia, CVA on MRI. Had TEE EF 15% . Evaluated by Dr Graciela HusbandsKlein while hospitalized.   Follow up for Heart Failure: Last visit asked her to start on lisinopril 1.25 mg BID which she tolerated. She also underwent CRT-D implantation on 11/05/13. Reports she is feeling much better and has a lot more energy. Denies SOB, orthopnea, CP or edema. Denies dizziness. SBP at hom 110-112. Able to walk ~ 1 mile before having to stop and trying to walk every day. Has needed lasix 1-3 x in the past week. Trying to follow low salt diet and drinking less than 2L a day. Weight 163-165 lbs.   PMH: 1. Nonischemic cardiomyopathy: Echo (7/15) with severe LV dilation, EF 25% with diffuse hypokinesis, mild to moderate MR.  RHC/ LHC 08/08/13 with RA 4, PA 23/8, PCWP 11, CI 2.52; no CAD.  TEE (9/15) with EF 15-20%, severe global hypokinesis, no LV thrombus, moderate MR, normal RV.  Echo (10/2013): EF 25-30%, grade I DD, mild AI.  2. CVA: 9/15 with dysarthria.  Carotids 9/15 with no significant CAD.  3. LBBB- Implant Medtronic CRT-D (10/2013)  Labs 09/23/13 K 5.1 Creatinine 0.68 Cholesterol 166 TGl 83 HDL 41, LDL 108 10/07/13: K 3.9, creatinine 0.65 11/03/13: K 3.8, creatinine 0.8  SH: lives with her son and husband. Does not drink or smoke. Currently not working.   FH: Mom had A fib and Heart Failure. Deceased [id number]        Sister has lupus  ROS: All systems negative except as listed in HPI, PMH and Problem List.   Current Outpatient Prescriptions  Medication Sig Dispense Refill  . atorvastatin (LIPITOR) 40 MG tablet Take 1 tablet (40 mg total) by  mouth daily at 6 PM. 30 tablet 0  . carvedilol (COREG) 3.125 MG tablet Take 1 tablet (3.125 mg total) by mouth 2 (two) times daily with a meal. 60 tablet 6  . clopidogrel (PLAVIX) 75 MG tablet Take 1 tablet (75 mg total) by mouth daily. 30 tablet 2  . furosemide (LASIX) 20 MG tablet Take 20 mg by mouth daily as needed for fluid.    [name]. lisinopril (PRINIVIL,ZESTRIL) 2.5 MG tablet Take 1.25 mg by mouth daily.     No current facility-administered medications for this encounter.    Filed Vitals:   11/20/13 1028  BP: 129/79  Pulse: 71  Resp: 18  Weight: 168 lb 8 oz (76.431 kg)  SpO2: 97%    PHYSICAL EXAM: General:  Well appearing. No resp difficulty. Husband and son present HEENT: normal Neck: supple. JVP flat. Carotids 2+ bilaterally; no bruits. No lymphadenopathy or thryomegaly appreciated. Cor: PMI normal. Regular rate & rhythm. No rubs, gallops or murmurs. Lungs: clear Abdomen: soft, nontender, nondistended. No hepatosplenomegaly. No bruits or masses. Good bowel sounds. Extremities: no cyanosis, clubbing, rash, edema Neuro: alert & orientedx3, cranial nerves grossly intact. Moves all 4 extremities w/o difficulty. Affect pleasant. Difficult word finding.    ASSESSMENT & PLAN:  1. Chronic Systolic Heart Failure: NICM s/p CRT-D, Cath without CAD in 07/2013. EF 15-20% per TEE (09/2013) - NYHA II symptoms and volume  status stable. Will continue lasix 20 mg PRN.  - Will continue coreg 3.125 mg BID.  - Will increase lisinopril to 1.25 mg BID. Check BMET today.  - Check to see whether patient has Medicaid next visit because if she does would arrange for cMRI to assess for infiltrative disease.  - Reinforced daily weights, low salt food choices, and limiting fluid intake to < 2 liters per day.  2. LBBB: - s/p CRT-D. Feels much better. 3. CVA: ?Embolic.  No atrial fibrillation has been detected so far.  She will continue Plavix for now.  - Interrogate ICD next visit once more than a few  weeks out to assess for any atrial fibrillation. If she does have any Afib will need to have anticoagulation.  4. Hyperlipidemia:  - Last lipids ok.     F/U 1 month Ulla Potash B NP-C  10:41 AM

## 2013-11-20 NOTE — Patient Instructions (Signed)
Doing great.  Increase your lisinopril to 1.25 mg (1/2 tablet) twice a day. Call if any dizziness.  Follow up in 1 month  Do the following things EVERYDAY: 1) Weigh yourself in the morning before breakfast. Write it down and keep it in a log. 2) Take your medicines as prescribed 3) Eat low salt foods-Limit salt (sodium) to 2000 mg per day.  4) Stay as active as you can everyday 5) Limit all fluids for the day to less than 2 liters 6)

## 2013-11-21 ENCOUNTER — Ambulatory Visit: Payer: Self-pay | Admitting: Neurology

## 2013-12-03 ENCOUNTER — Ambulatory Visit (INDEPENDENT_AMBULATORY_CARE_PROVIDER_SITE_OTHER): Payer: Self-pay | Admitting: Neurology

## 2013-12-03 ENCOUNTER — Telehealth: Payer: Self-pay | Admitting: *Deleted

## 2013-12-03 ENCOUNTER — Encounter: Payer: Self-pay | Admitting: Neurology

## 2013-12-03 VITALS — BP 124/76 | HR 73 | Ht 65.5 in | Wt 168.6 lb

## 2013-12-03 DIAGNOSIS — I6340 Cerebral infarction due to embolism of unspecified cerebral artery: Secondary | ICD-10-CM

## 2013-12-03 DIAGNOSIS — R9430 Abnormal result of cardiovascular function study, unspecified: Secondary | ICD-10-CM | POA: Insufficient documentation

## 2013-12-03 DIAGNOSIS — I5022 Chronic systolic (congestive) heart failure: Secondary | ICD-10-CM

## 2013-12-03 DIAGNOSIS — I447 Left bundle-branch block, unspecified: Secondary | ICD-10-CM

## 2013-12-03 DIAGNOSIS — R931 Abnormal findings on diagnostic imaging of heart and coronary circulation: Secondary | ICD-10-CM | POA: Insufficient documentation

## 2013-12-03 DIAGNOSIS — E785 Hyperlipidemia, unspecified: Secondary | ICD-10-CM

## 2013-12-03 NOTE — Telephone Encounter (Signed)
Spoke with patient to move appointment time up to 3:15 instead of 3:30, patient verbalized understanding.

## 2013-12-03 NOTE — Progress Notes (Signed)
STROKE NEUROLOGY FOLLOW UP NOTE  NAME: [name] DOB: 08/17/1950  REASON FOR VISIT: stroke follow up HISTORY FROM: chart and pt  Today we had the pleasure of seeing [name] in follow-up at our Neurology Clinic. Pt was accompanied by son.   History Summary [name] is an 63 y.o. female with known CHF and HLD who was taking ASA daily. she was admitted on 09/23/13 for acute onset right facial droop and slurred speech. She  Denies HA, vertigo, double vision, difficulty swallowing, arm or leg weakness, confusion, or visual disturbances. Initial CT scan of head was normal but follow up MRI showed a right MCA cortical stroke, but no left hemisphere lesions to explain her right facial droop. Anyway, she was considered as embolic stroke. Her 2D echo showed EF 15% and TEE done confirmed EF 15-20% but no clot seen. Her ASA was changed to plavix and continued on statin. She was discharged home.  Interval History During the interval time, the patient has been doing well. She had follow up with cardiology and repeat 2D echo showed EF 25-30%. Due to her LBBB with low EF, Dr. Graciela Husbands put pacemaker/AICD for her on 11/05/13. Since then pt felt much better, much energy and less fatigue. She is to see Dr. Shirlee Latch on 12/24/13. She still has intermittent difficulty with hesitancy and paraphasic errors.  REVIEW OF SYSTEMS: Full 14 system review of systems performed and notable only for those listed below and in HPI above, all others are negative:  Constitutional: N/A  Cardiovascular: N/A  Ear/Nose/Throat: N/A  Skin: N/A  Eyes: N/A  Respiratory: N/A  Gastroitestinal: N/A  Genitourinary: N/A Hematology/Lymphatic: N/A  Endocrine: N/A  Musculoskeletal: N/A  Allergy/Immunology: N/A  Neurological: N/A  Psychiatric: N/A  The following represents the patient's updated allergies and side effects list: Allergies  Allergen Reactions  . Onion Other (See Comments)    gas  . Tomato Other (See  Comments)    reflux    Labs since last visit of relevance include the following: Results for orders placed or performed during the hospital encounter of 11/20/13  Basic metabolic panel  Result Value Ref Range   Sodium 138 137 - 147 mEq/L   Potassium 3.9 3.7 - 5.3 mEq/L   Chloride 103 96 - 112 mEq/L   CO2 21 19 - 32 mEq/L   Glucose, Bld 111 (H) 70 - 99 mg/dL   BUN 19 6 - 23 mg/dL   Creatinine, Ser 8.87 0.50 - 1.10 mg/dL   Calcium 9.5 8.4 - 57.9 mg/dL   GFR calc non Af Amer >90 >90 mL/min   GFR calc Af Amer >90 >90 mL/min   Anion gap 14 5 - 15    The neurologically relevant items on the patient's problem list were reviewed on today's visit.  Neurologic Examination  A problem focused neurological exam (12 or more points of the single system neurologic examination, vital signs counts as 1 point, cranial nerves count for 8 points) was performed.  Blood pressure 124/76, pulse 73, height 5' 5.5" (1.664 m), weight 168 lb 9.6 oz (76.476 kg).  General - Well nourished, well developed, in no apparent distress.  Ophthalmologic - not able to see through.  Cardiovascular - Regular rate and rhythm with no murmur.  Mental Status -  Level of arousal and orientation to time, place, and person were intact. Language including expression, naming, repetition, comprehension was assessed and found intact. However, she does show intermittent speech difficulty with paraphasic  errors when trying to speak fast. When slow down, she was able to get words out correctly.  Cranial Nerves II - XII - II - Visual field intact OU. III, IV, VI - Extraocular movements intact. V - Facial sensation intact bilaterally. VII - right nasolabial fold flatenning. VIII - Hearing & vestibular intact bilaterally. X - Palate elevates symmetrically. XI - Chin turning & shoulder shrug intact bilaterally. XII - Tongue protrusion intact.  Motor Strength - The patient's strength was normal in all extremities and pronator  drift was absent.  Bulk was normal and fasciculations were absent.   Motor Tone - Muscle tone was assessed at the neck and appendages and was normal.  Reflexes - The patient's reflexes were normal in all extremities and she had no pathological reflexes.  Sensory - Light touch, temperature/pinprick, vibration and proprioception, and Romberg testing were assessed and were normal.    Coordination - The patient had normal movements in the hands and feet with no ataxia or dysmetria.  Tremor was absent.  Gait and Station - The patient's transfers, posture, gait, station, and turns were observed as normal.  Data reviewed: I personally reviewed the images and agree with the radiology interpretations.  MRI HEAD IMPRESSION:  1. Acute ischemic right parietal lobe infarct involving the posterior right MCA territory. No associated hemorrhage or significant mass effect. 2. Atrophy with mild chronic small vessel ischemic changes involving the supratentorial white matter.  MRA HEAD IMPRESSION:  Unremarkable MRA of the brain with no hemodynamically significant stenosis, proximal branch occlusion, or other abnormality Identified.  CT head 09/23/13 - No acute intracranial abnormality.  CUS - - The vertebral arteries appear patent with antegrade flow. - Findings consistent with 1-39 percent stenosis involving the right internal carotid artery and the left internal carotid artery.  2D echo 09/25/13 - - Left ventricle: The cavity size was normal. Wall thickness was normal. The estimated ejection fraction was 15%. Diffuse hypokinesis. Doppler parameters are consistent with abnormal left ventricular relaxation (grade 1 diastolic dysfunction). Doppler parameters are consistent with high ventricular filling pressure. - Aortic valve: There was trivial regurgitation. - Mitral valve: There was moderate regurgitation. - Left atrium: The atrium was mildly dilated.  Impressions: - Severe  global reduction in LV function; grade 1 diastolic dysfunction and elevated left ventricular filling pressures, mild LAE; moderate MR. Compared to 08/07/13, LV function remains severely reduced.  2D echo 11/03/13 - - Left ventricle: The cavity size was normal. Wall thickness was normal. Systolic function was severely reduced. The estimated ejection fraction was in the range of 25% to 30%. Diffuse hypokinesis. There is dyskinesis of the anteroseptal myocardium. There is akinesis of the basal-midinferior myocardium. Doppler parameters are consistent with abnormal left ventricular relaxation (grade 1 diastolic dysfunction). - Aortic valve: There was mild regurgitation. - Mitral valve: Calcified annulus. Mildly thickened, mildly calcified leaflets . There was mild regurgitation. - Left atrium: The atrium was mildly dilated.  Impressions: - When compared to prior study, EF has improved.  TEE 09/26/13 - No LV or LA thrombus noted. No definite evidence to anticoagulate her in the absence of visualized LV thrombus or known atrial fibrillation, though agree that dilated cardiomyopathy certainly raises risk. I think that she needs long-term monitoring for atrial fibrillation, will talk with EP.  Component     Latest Ref Rng 09/23/2013  Cholesterol     0 - 200 mg/dL 161  Triglycerides     <150 mg/dL 83  HDL     >09 mg/dL  41  Total CHOL/HDL Ratio      4.0  VLDL     0 - 40 mg/dL 17  LDL (calc)     0 - 99 mg/dL 098108 (H)  Hgb J1BA1c MFr Bld     <5.7 % 5.6  Mean Plasma Glucose     <117 mg/dL 147114    Assessment: As you may recall, she is a 63 y.o. Caucasian female with PMH of CHF and HLD admitted on 09/23/13 for right facial droop and slurry speech. MRI showed right MCA cortical stroke but no left hemisphere lesion, but still consistent with emboli stroke. Her EF was 15% at that time and follow up EF in 10/2013 showed improvement to 25-30%. TEE negative for LV thrombus.  She had pacemaker/AICD put in by Dr. Graciela HusbandsKlein, and pt felt much better after that. She is on plavix now. Recommend to check 2D echo in one month and to see if there is further improvement of EF as pt is feeling much better. If still lower than 25%, we recommend anticoagulation with coumadin for stroke prevention. If more than 30%, then continue plavix. Also her pacemaker can be interrogated periodically,  If afib found, she need anticoagulation also.  Plan:  - continue plavix and lipitor for now for stroke prevention - appointment with Dr. Shirlee LatchMcLean on 12/24/13. I recommend to repeat 2D echo in one month and to see if there is further improvement of EF as pt is feeling much better. If still lower than 25%, we recommend anticoagulation with coumadin for stroke prevention. If more than 30%, then continue plavix. - recommend to periodically interrogate pacemaker. If afib found, she needs anticoagulation. - Follow up with your primary care physician for stroke risk factor modification. Recommend maintain blood pressure goal <130/80, diabetes with hemoglobin A1c goal below 6.5% and lipids with LDL cholesterol goal below 70 mg/dL.  - check BP at home - RTC in 2 months.  No orders of the defined types were placed in this encounter.    Patient Instructions  - continue plavix and lipitor for now - due to low EF and hx of embolic stroke, I am thinking coumadin is the way to go unless EF >30%. I will send my note to Dr. Shirlee LatchMcLean regarding the anticoagulation or repeat 2D echo for further evaluation - you have appointment with Dr. Shirlee LatchMcLean on 12/24/13 and please discuss with Dr. Shirlee LatchMcLean too about anticoagulation - Follow up with your primary care physician for stroke risk factor modification. Recommend maintain blood pressure goal <130/80, diabetes with hemoglobin A1c goal below 6.5% and lipids with LDL cholesterol goal below 70 mg/dL.  - check BP at home - follow up in 2 months.   [name] PlanJindong Schae Cando, MD PhD [facility] [address], [address] [place], [place] [address] [phone]

## 2013-12-03 NOTE — Patient Instructions (Signed)
-   continue plavix and lipitor for now - due to low EF and hx of embolic stroke, I am thinking coumadin is the way to go unless EF >30%. I will send my note to Dr. Shirlee Latch regarding the anticoagulation or repeat 2D echo for further evaluation - you have appointment with Dr. Shirlee Latch on 12/24/13 and please discuss with Dr. Shirlee Latch too about anticoagulation - Follow up with your primary care physician for stroke risk factor modification. Recommend maintain blood pressure goal <130/80, diabetes with hemoglobin A1c goal below 6.5% and lipids with LDL cholesterol goal below 70 mg/dL.  - check BP at home - follow up in 2 months.

## 2013-12-08 ENCOUNTER — Telehealth: Payer: Self-pay | Admitting: Internal Medicine

## 2013-12-08 NOTE — Telephone Encounter (Signed)
Advised to take Robitussin plain or Coricidin cold. If persists to contact PCP. Patient verbalized understanding.

## 2013-12-08 NOTE — Telephone Encounter (Signed)
New Msg   Patient calling stating she has nasal congestion and wants to know what she can take. Contact at [phone]

## 2013-12-09 ENCOUNTER — Encounter: Payer: Self-pay | Admitting: Internal Medicine

## 2013-12-24 ENCOUNTER — Encounter (HOSPITAL_COMMUNITY): Payer: Self-pay

## 2013-12-25 ENCOUNTER — Encounter (HOSPITAL_COMMUNITY): Payer: Self-pay | Admitting: Cardiovascular Disease

## 2014-01-02 ENCOUNTER — Encounter (HOSPITAL_COMMUNITY): Payer: Self-pay

## 2014-01-02 ENCOUNTER — Ambulatory Visit (HOSPITAL_COMMUNITY)
Admission: RE | Admit: 2014-01-02 | Discharge: 2014-01-02 | Disposition: A | Discharge status: Home / Self Care | Payer: Medicare Other | Source: Ambulatory Visit | Attending: Internal Medicine | Admitting: Internal Medicine

## 2014-01-02 VITALS — BP 122/62 | HR 80 | Resp 18 | Wt 167.5 lb

## 2014-01-02 DIAGNOSIS — Z7901 Long term (current) use of anticoagulants: Secondary | ICD-10-CM | POA: Insufficient documentation

## 2014-01-02 DIAGNOSIS — E785 Hyperlipidemia, unspecified: Secondary | ICD-10-CM | POA: Diagnosis not present

## 2014-01-02 DIAGNOSIS — I5022 Chronic systolic (congestive) heart failure: Secondary | ICD-10-CM | POA: Diagnosis not present

## 2014-01-02 DIAGNOSIS — Z8673 Personal history of transient ischemic attack (TIA), and cerebral infarction without residual deficits: Secondary | ICD-10-CM | POA: Diagnosis not present

## 2014-01-02 DIAGNOSIS — I447 Left bundle-branch block, unspecified: Secondary | ICD-10-CM

## 2014-01-02 DIAGNOSIS — I340 Nonrheumatic mitral (valve) insufficiency: Secondary | ICD-10-CM | POA: Insufficient documentation

## 2014-01-02 DIAGNOSIS — Z79899 Other long term (current) drug therapy: Secondary | ICD-10-CM | POA: Diagnosis not present

## 2014-01-02 DIAGNOSIS — Z95810 Presence of automatic (implantable) cardiac defibrillator: Secondary | ICD-10-CM | POA: Insufficient documentation

## 2014-01-02 DIAGNOSIS — I429 Cardiomyopathy, unspecified: Secondary | ICD-10-CM | POA: Diagnosis not present

## 2014-01-02 DIAGNOSIS — I6340 Cerebral infarction due to embolism of unspecified cerebral artery: Secondary | ICD-10-CM

## 2014-01-02 MED ORDER — CARVEDILOL 3.125 MG PO TABS: 3.1250 mg | ORAL_TABLET | Freq: Two times a day (BID) | ORAL | Status: DC

## 2014-01-02 MED ORDER — LISINOPRIL 2.5 MG PO TABS: 2.5000 mg | ORAL_TABLET | Freq: Two times a day (BID) | ORAL | Status: DC

## 2014-01-02 MED ORDER — CLOPIDOGREL BISULFATE 75 MG PO TABS: 75.0000 mg | ORAL_TABLET | Freq: Every day | ORAL | Status: DC

## 2014-01-02 MED ORDER — ATORVASTATIN CALCIUM 40 MG PO TABS: 40.0000 mg | ORAL_TABLET | Freq: Every day | ORAL | Status: DC

## 2014-01-02 MED ORDER — FUROSEMIDE 20 MG PO TABS: 20.0000 mg | ORAL_TABLET | Freq: Every day | ORAL | Status: DC | PRN

## 2014-01-02 NOTE — Progress Notes (Signed)
Patient ID: RONIN SAW, female   DOB: 1950/06/14, 63 y.o.   MRN: [id number]  PCP: Family Medicine EP: Dr Graciela Husbands.  Primary Cardiologist: Dr Shirlee Latch  Neurologist: Dr Pearlean Brownie  HPI: Ms. Furlough is a 63 y.o. female with chronic systolic CHF due to NICM (no significant CAD) s/p CRT-D, LBBB, mild-mod MR and CVA (09/2013).  Admitted 9/8 with right sided facial droop and expressive aphasia, CVA on MRI. Had TEE EF 15% . Evaluated by Dr Graciela Husbands while hospitalized.   Follow up for Heart Failure: Last visit lisinopril increaseed 1.25 mg twice a day. Overall doing great.  Denies SOB/PND/Orthopnea/CP.  Usually walking 1 1/2 miles per day. Taking all medications. Weight at home 164 pounds.   PMH: 1. Nonischemic cardiomyopathy: Echo (7/15) with severe LV dilation, EF 25% with diffuse hypokinesis, mild to moderate MR.  RHC/ LHC 08/08/13 with RA 4, PA 23/8, PCWP 11, CI 2.52; no CAD.  TEE (9/15) with EF 15-20%, severe global hypokinesis, no LV thrombus, moderate MR, normal RV.  Echo (10/2013): EF 25-30%, grade I DD, mild AI.  2. CVA: 9/15 with dysarthria.  Carotids 9/15 with no significant CAD.  3. LBBB- Implant Medtronic CRT-D (10/2013)  Labs 09/23/13 K 5.1 Creatinine 0.68 Cholesterol 166 TGl 83 HDL 41, LDL 108 10/07/13: K 3.9, creatinine 0.65 11/03/13: K 3.8, creatinine 0.8 11/20/13: K 3.9 Creatinine 0.66   SH: lives with her son and husband. Does not drink or smoke. Currently not working.   FH: Mom had A fib and Heart Failure. Deceased 72        Sister has lupus  ROS: All systems negative except as listed in HPI, PMH and Problem List.   Current Outpatient Prescriptions  Medication Sig Dispense Refill  . atorvastatin (LIPITOR) 40 MG tablet Take 1 tablet (40 mg total) by mouth daily at 6 PM. 30 tablet 3  . carvedilol (COREG) 3.125 MG tablet Take 1 tablet (3.125 mg total) by mouth 2 (two) times daily with a meal. 60 tablet 6  . clopidogrel (PLAVIX) 75 MG tablet Take 1 tablet (75 mg total) by mouth daily.  30 tablet 3  . furosemide (LASIX) 20 MG tablet Take 1 tablet (20 mg total) by mouth daily as needed for fluid. 15 tablet 3  . lisinopril (PRINIVIL,ZESTRIL) 2.5 MG tablet Take 0.5 tablets (1.25 mg total) by mouth 2 (two) times daily. 15 tablet 3   No current facility-administered medications for this encounter.    Filed Vitals:   01/02/14 0944  BP: 122/62  Pulse: 80  Resp: 18  Weight: 167 lb 8 oz (75.978 kg)  SpO2: 100%    PHYSICAL EXAM: General:  Well appearing. No resp difficulty. Son present HEENT: normal Neck: supple. JVP flat. Carotids 2+ bilaterally; no bruits. No lymphadenopathy or thryomegaly appreciated. Cor: PMI normal. Regular rate & rhythm. No rubs, gallops or murmurs. Lungs: clear Abdomen: soft, nontender, nondistended. No hepatosplenomegaly. No bruits or masses. Good bowel sounds. Extremities: no cyanosis, clubbing, rash, edema Neuro: alert & orientedx3, cranial nerves grossly intact. Moves all 4 extremities w/o difficulty. Affect pleasant. Difficult word finding.    ASSESSMENT & PLAN:  1. Chronic Systolic Heart Failure: NICM s/p CRT-D, Cath without CAD in 07/2013. EF 15-20% per TEE (09/2013) - NYHA II symptoms and volume status stable. Will continue lasix 20 mg PRN.  - Will continue coreg 3.125 mg BID.  - Will increase lisinopril to 2.5 mg twice a day. Check BMET in 10 days. .   - Medicaid pending. Hold off  on cMRI. ? infiltratiive disease.  - Reinforced daily weights, low salt food choices, and limiting fluid intake to < 2 liters per day.  2. LBBB: - s/p CRT-D. Feels much better. 3. CVA: ?Embolic.  No atrial fibrillation has been detected so far.  She will continue Plavix for now.  - Interrogate ICD next visit once more than a few weeks out to assess for any atrial fibrillation. If she does have any Afib will need to have anticoagulation.  4. Hyperlipidemia:  - Last lipids ok.     Follow up 1 month and in 10 days for BMET.  CLEGG,AMY NP-C  9:45 AM

## 2014-01-02 NOTE — Patient Instructions (Signed)
INCREASE Lisinopril to 1 whole tablet (2.5mg ) twice daily.  Return in 2 weeks for lab work.  Follow up in 1 month.  Happy [name] Christmas, and Happy New Year!  Do the following things EVERYDAY: 1) Weigh yourself in the morning before breakfast. Write it down and keep it in a log. 2) Take your medicines as prescribed 3) Eat low salt foods-Limit salt (sodium) to 2000 mg per day.  4) Stay as active as you can everyday 5) Limit all fluids for the day to less than 2 liters

## 2014-01-20 ENCOUNTER — Other Ambulatory Visit (HOSPITAL_COMMUNITY): Payer: Self-pay

## 2014-02-02 ENCOUNTER — Ambulatory Visit (HOSPITAL_COMMUNITY)
Admission: RE | Admit: 2014-02-02 | Discharge: 2014-02-02 | Disposition: A | Discharge status: Home / Self Care | Payer: Medicare Other | Source: Ambulatory Visit | Attending: Cardiology | Admitting: Cardiology

## 2014-02-02 ENCOUNTER — Encounter (HOSPITAL_COMMUNITY): Payer: Self-pay

## 2014-02-02 ENCOUNTER — Telehealth: Payer: Self-pay

## 2014-02-02 VITALS — BP 108/78 | HR 71 | Wt 167.0 lb

## 2014-02-02 DIAGNOSIS — I429 Cardiomyopathy, unspecified: Secondary | ICD-10-CM | POA: Insufficient documentation

## 2014-02-02 DIAGNOSIS — I5022 Chronic systolic (congestive) heart failure: Secondary | ICD-10-CM | POA: Insufficient documentation

## 2014-02-02 DIAGNOSIS — I6350 Cerebral infarction due to unspecified occlusion or stenosis of unspecified cerebral artery: Secondary | ICD-10-CM

## 2014-02-02 DIAGNOSIS — Z95810 Presence of automatic (implantable) cardiac defibrillator: Secondary | ICD-10-CM | POA: Diagnosis not present

## 2014-02-02 DIAGNOSIS — Z7902 Long term (current) use of antithrombotics/antiplatelets: Secondary | ICD-10-CM | POA: Diagnosis not present

## 2014-02-02 DIAGNOSIS — Z79899 Other long term (current) drug therapy: Secondary | ICD-10-CM | POA: Insufficient documentation

## 2014-02-02 DIAGNOSIS — Z8673 Personal history of transient ischemic attack (TIA), and cerebral infarction without residual deficits: Secondary | ICD-10-CM | POA: Insufficient documentation

## 2014-02-02 DIAGNOSIS — I447 Left bundle-branch block, unspecified: Secondary | ICD-10-CM | POA: Diagnosis not present

## 2014-02-02 LAB — BASIC METABOLIC PANEL
Anion gap: 8 (ref 5–15)
BUN: 12 mg/dL (ref 6–23)
CALCIUM: 9.2 mg/dL (ref 8.4–10.5)
CO2: 27 mmol/L (ref 19–32)
CREATININE: 0.77 mg/dL (ref 0.50–1.10)
Chloride: 105 mEq/L (ref 96–112)
GFR calc Af Amer: >90 mL/min (ref >90)
GFR calc non Af Amer: 86 mL/min — ABNORMAL LOW (ref >90)
Glucose, Bld: 95 mg/dL (ref 70–99)
Potassium: 3.6 mmol/L (ref 3.5–5.1)
Sodium: 140 mmol/L (ref 135–145)

## 2014-02-02 MED ORDER — CARVEDILOL 6.25 MG PO TABS
6.2500 mg | ORAL_TABLET | Freq: Two times a day (BID) | ORAL | Status: DC
Start: 2014-02-02 — End: 2014-04-01

## 2014-02-02 MED ORDER — SPIRONOLACTONE 25 MG PO TABS: 12.5000 mg | ORAL_TABLET | Freq: Every day | ORAL | Status: DC

## 2014-02-02 MED ORDER — LISINOPRIL 2.5 MG PO TABS: 1.2500 mg | ORAL_TABLET | Freq: Two times a day (BID) | ORAL | Status: DC

## 2014-02-02 NOTE — Progress Notes (Signed)
Patient ID: DEAJA RIZO, female   DOB: 1950/10/22, 64 y.o.   MRN: [id number]   PCP: Family Medicine EP: Dr Graciela Husbands.  Primary Cardiologist: Dr Shirlee Latch  Neurologist: Dr Pearlean Brownie  HPI: Ms. Norkus is a 64 y.o. female with chronic systolic CHF due to NICM (no significant CAD) s/p Medtronic CRT-D, LBBB, mild-mod MR and CVA (09/2013).  Admitted in 9/15 with right sided facial droop and expressive aphasia, CVA on MRI. Had TEE EF 15% .   Follow up for Heart Failure: Last visit lisinopril increaseed 2.5 mg twice a day. Says over past few weeks weight has been up and down a little due to eating pizza. Felt bloated. Now taking lasix every day. Weight was up to 165 but now back down to 163.8 which is near her baseline. Breathing feels "great". Denies CP/orthopnea or PND. Walking regularly - wears pedometer. Tries to get more than 2,000 steps. No LE edema. No problems with medicines. No dizziness.    PMH: 1. Nonischemic cardiomyopathy: Echo (7/15) with severe LV dilation, EF 25% with diffuse hypokinesis, mild to moderate MR.  RHC/ LHC 08/08/13 with RA 4, PA 23/8, PCWP 11, CI 2.52; no CAD.  TEE (9/15) with EF 15-20%, severe global hypokinesis, no LV thrombus, moderate MR, normal RV.  Echo (10/2013): EF 25-30%, grade I DD, mild AI.  2. CVA: 9/15 with dysarthria.  Carotids 9/15 with no significant CAD.  3. LBBB- Implant Medtronic CRT-D (10/2013)  Labs 09/23/13 K 5.1 Creatinine 0.68 Cholesterol 166 TGl 83 HDL 41, LDL 108 10/07/13: K 3.9, creatinine 0.65 11/03/13: K 3.8, creatinine 0.8 11/20/13: K 3.9 Creatinine 0.66   SH: lives with her son and husband. Does not drink or smoke. Currently not working.   FH: Mom had A fib and Heart Failure. Deceased 42        Sister has lupus  ROS: All systems negative except as listed in HPI, PMH and Problem List.   Current Outpatient Prescriptions  Medication Sig Dispense Refill  . atorvastatin (LIPITOR) 40 MG tablet Take 1 tablet (40 mg total) by mouth daily at 6 PM. 30  tablet 3  . carvedilol (COREG) 3.125 MG tablet Take 1 tablet (3.125 mg total) by mouth 2 (two) times daily with a meal. 60 tablet 6  . clopidogrel (PLAVIX) 75 MG tablet Take 1 tablet (75 mg total) by mouth daily. 30 tablet 3  . furosemide (LASIX) 20 MG tablet Take 1 tablet (20 mg total) by mouth daily as needed for fluid. 15 tablet 3  . lisinopril (PRINIVIL,ZESTRIL) 2.5 MG tablet Take 1 tablet (2.5 mg total) by mouth 2 (two) times daily. (Patient taking differently: Take 1.25 mg by mouth 2 (two) times daily. ) 90 tablet 3   No current facility-administered medications for this encounter.    Filed Vitals:   02/02/14 1033  BP: 108/78  Pulse: 71  Weight: 167 lb (75.751 kg)  SpO2: 99%    PHYSICAL EXAM: General:  Well appearing. No resp difficulty. Son present HEENT: normal Neck: supple. JVP flat. Carotids 2+ bilaterally; no bruits. No lymphadenopathy or thryomegaly appreciated. Cor: PMI normal. Regular rate & rhythm. No rubs, gallops or murmurs. Lungs: clear Abdomen: soft, nontender, nondistended. No hepatosplenomegaly. No bruits or masses. Good bowel sounds. Extremities: no cyanosis, clubbing, rash, edema Neuro: alert & orientedx3, cranial nerves grossly intact. Moves all 4 extremities w/o difficulty. Affect pleasant. Difficult word finding.    ASSESSMENT & PLAN:  1. Chronic Systolic Heart Failure: NICM s/p CRT-D, Cath without CAD in  07/2013. EF 15-20% per TEE (09/2013) - NYHA II symptoms and volume status stable. Will continue lasix 20 mg PRN.  - Will increase coreg 3.125 mg BID.  - Continue lisinopril to 2.5 mg twice a day.  - Add spironolactone 12.5 daily. Check BMET today and in 1 week.  - Echo in 1 month - Reinforced daily weights, low salt food choices, and limiting fluid intake to < 2 liters per day.  2. LBBB: - s/p CRT-D. Feels much better. 3. CVA: ?Embolic.  No atrial fibrillation has been detected so far.  She will continue Plavix for now. Neurology feels she would benefit  from coumadin if EF < 30% - Will interrogate ICD to assess for any atrial fibrillation. If she does have any Afib will need to have anticoagulation. Otherwise would continue Plavix.    Arvilla Meres MD  10:49 AM

## 2014-02-02 NOTE — Addendum Note (Signed)
Encounter addended by: Noralee Space, RN on: 02/02/2014 11:12 AM<BR>     Documentation filed: Dx Association, Patient Instructions Section, Orders

## 2014-02-02 NOTE — Patient Instructions (Signed)
Increase Carvedilol to 6.25 mg Twice daily   Start Spironolactone 12.5 mg (1/2 tablet) daily  Labs today and in 1 week (bmet)  Your physician recommends that you schedule a follow-up appointment in: 1 month with echocardiogram

## 2014-02-02 NOTE — Telephone Encounter (Signed)
Patient called in to Cx her apt. With Dr.Xu for 02-11-14. Patient has not had her echocardiogram. Patient will call back to reschedule her follow up with Dr. Roda Shutters. Echo has not been scheduled yet. Patient does not want to schedule yet.

## 2014-02-11 ENCOUNTER — Other Ambulatory Visit (HOSPITAL_COMMUNITY): Payer: Self-pay

## 2014-02-11 ENCOUNTER — Ambulatory Visit: Payer: Self-pay | Admitting: Neurology

## 2014-02-13 ENCOUNTER — Other Ambulatory Visit (HOSPITAL_COMMUNITY): Payer: Self-pay | Admitting: Cardiology

## 2014-02-13 MED ORDER — FUROSEMIDE 20 MG PO TABS: 20.0000 mg | ORAL_TABLET | Freq: Every day | ORAL | Status: DC | PRN

## 2014-02-17 ENCOUNTER — Encounter: Payer: Self-pay | Admitting: Internal Medicine

## 2014-02-24 ENCOUNTER — Encounter: Payer: Self-pay | Admitting: Internal Medicine

## 2014-02-24 ENCOUNTER — Ambulatory Visit (INDEPENDENT_AMBULATORY_CARE_PROVIDER_SITE_OTHER): Payer: Medicare Other | Admitting: Internal Medicine

## 2014-02-24 VITALS — BP 110/72 | HR 68 | Ht 65.0 in | Wt 167.8 lb

## 2014-02-24 DIAGNOSIS — Z4502 Encounter for adjustment and management of automatic implantable cardiac defibrillator: Secondary | ICD-10-CM

## 2014-02-24 DIAGNOSIS — I5022 Chronic systolic (congestive) heart failure: Secondary | ICD-10-CM

## 2014-02-24 DIAGNOSIS — I447 Left bundle-branch block, unspecified: Secondary | ICD-10-CM

## 2014-02-24 DIAGNOSIS — I429 Cardiomyopathy, unspecified: Secondary | ICD-10-CM

## 2014-02-24 DIAGNOSIS — I428 Other cardiomyopathies: Secondary | ICD-10-CM

## 2014-02-24 DIAGNOSIS — I6350 Cerebral infarction due to unspecified occlusion or stenosis of unspecified cerebral artery: Secondary | ICD-10-CM

## 2014-02-24 LAB — MDC_IDC_ENUM_SESS_TYPE_INCLINIC
Battery Remaining Longevity: 111 mo
Brady Statistic AS VP Percent: 85.84 %
Brady Statistic AS VS Percent: 1.37 %
Brady Statistic RA Percent Paced: 12.79 %
Brady Statistic RV Percent Paced: 3.42 %
HIGH POWER IMPEDANCE MEASURED VALUE: 78 Ohm
HighPow Impedance: 190 Ohm
Lead Channel Impedance Value: 532 Ohm
Lead Channel Pacing Threshold Amplitude: 0.75 V
Lead Channel Pacing Threshold Pulse Width: 0.4 ms
Lead Channel Sensing Intrinsic Amplitude: 17.875 mV
Lead Channel Sensing Intrinsic Amplitude: 4.75 mV
Lead Channel Setting Pacing Amplitude: 1.5 V
Lead Channel Setting Pacing Amplitude: 2 V
Lead Channel Setting Pacing Amplitude: 2 V
Lead Channel Setting Pacing Pulse Width: 0.4 ms
Lead Channel Setting Pacing Pulse Width: 0.5 ms
Lead Channel Setting Sensing Sensitivity: 0.3 mV
MDC IDC MSMT BATTERY VOLTAGE: 3.04 V
MDC IDC MSMT LEADCHNL LV PACING THRESHOLD AMPLITUDE: 0.75 V
MDC IDC MSMT LEADCHNL LV PACING THRESHOLD PULSEWIDTH: 0.5 ms
MDC IDC MSMT LEADCHNL RA IMPEDANCE VALUE: 722 Ohm
MDC IDC MSMT LEADCHNL RA PACING THRESHOLD AMPLITUDE: 0.5 V
MDC IDC MSMT LEADCHNL RA PACING THRESHOLD PULSEWIDTH: 0.4 ms
MDC IDC SESS DTM: 20160209172738
MDC IDC SET ZONE DETECTION INTERVAL: 450 ms
MDC IDC STAT BRADY AP VP PERCENT: 12.58 %
MDC IDC STAT BRADY AP VS PERCENT: 0.22 %
Zone Setting Detection Interval: 250 ms
Zone Setting Detection Interval: 300 ms
Zone Setting Detection Interval: 350 ms

## 2014-02-24 NOTE — Progress Notes (Signed)
No care team member to display   HPI  [name] is a 64 y.o. female Seen following hospital consultation regarding loop recorder placement for cryptogenic stroke. It was noted at that time that she had presented with acute heart failure in July 2015. and was found to have an ejection fraction 20-25%. Catheterization demonstrated no coronary disease. She is also noted to have left bundle branch block. Medication up titration was limited by hypotension. It was felt that CRT would be appropriate if her LV function remains depressed.  She underwent CRT implant 10/15  She is much improved     Past Medical History  Diagnosis Date  . GERD (gastroesophageal reflux disease)   . Arthritis   . PONV (postoperative nausea and vomiting)   . Systolic CHF     a. Dx 07/2013 - due to NICM. Cath normal cors. EF 25%.  [name] LBBB (left bundle branch block)   . Hypotension   . Mitral regurgitation     a. Mild-mod MR by echo 07/2013.  [name] Automatic implantable cardioverter-defibrillator in situ   . HLD (hyperlipidemia)   . CHF (congestive heart failure)   . Stroke 09/23/2013  . Cardiac resynchronization therapy defibrillator (CRT-D) in place     a. placed on 11/05/13.    Past Surgical History  Procedure Laterality Date  . Tonsillectomy    . Knee arthroscopy Right   . Wrist arthroscopy  12/21/2010    Procedure: ARTHROSCOPY WRIST;  Surgeon: [name], MD;  Location: [facility];  Service: Orthopedics;  Laterality: Right;  right wrist, repair TFCC on right, open reconstruction ECU sheath  . Tee without cardioversion N/A 09/26/2013    Procedure: TRANSESOPHAGEAL ECHOCARDIOGRAM (TEE);  Surgeon: [name], MD;  Location: [facility] ENDOSCOPY;  Service: Cardiovascular;  Laterality: N/A;  . Bi-ventricular implantable cardioverter defibrillator  (crt-d)  11/05/2013  . Cardiac catheterization  07/2013  . Left and right heart catheterization with coronary angiogram N/A 08/08/2013   Procedure: LEFT AND RIGHT HEART CATHETERIZATION WITH CORONARY ANGIOGRAM;  Surgeon: [name], MD;  Location: [facility] CATH LAB;  Service: Cardiovascular;  Laterality: N/A;  . Bi-ventricular implantable cardioverter defibrillator N/A 11/05/2013    Procedure: BI-VENTRICULAR IMPLANTABLE CARDIOVERTER DEFIBRILLATOR  (CRT-D);  Surgeon: Duke Salvia, MD;  Location: [facility] CATH LAB;  Service: Cardiovascular;  Laterality: N/A;    Current Outpatient Prescriptions  Medication Sig Dispense Refill  . atorvastatin (LIPITOR) 40 MG tablet Take 1 tablet (40 mg total) by mouth daily at 6 PM. 30 tablet 3  . carvedilol (COREG) 6.25 MG tablet Take 1 tablet (6.25 mg total) by mouth 2 (two) times daily with a meal. 60 tablet 3  . clopidogrel (PLAVIX) 75 MG tablet Take 1 tablet (75 mg total) by mouth daily. 30 tablet 3  . furosemide (LASIX) 20 MG tablet Take 1 tablet (20 mg total) by mouth daily as needed for fluid. 15 tablet 3  . lisinopril (PRINIVIL,ZESTRIL) 2.5 MG tablet Take 0.5 tablets (1.25 mg total) by mouth 2 (two) times daily. 90 tablet 3  . spironolactone (ALDACTONE) 25 MG tablet Take 0.5 tablets (12.5 mg total) by mouth daily. 15 tablet 3   No current facility-administered medications for this visit.    Allergies  Allergen Reactions  . Onion Other (See Comments)    gas  . Tomato Other (See Comments)    reflux    Review of Systems negative except from HPI and PMH  Physical Exam BP 110/72 mmHg  Pulse 68  Ht 5\' 5"  (1.651 m)  Wt 167 lb 12.8 oz (76.114 kg)  BMI 27.92 kg/m2 Well developed and well nourished in no acute distress HENT normal E scleral and icterus clear Neck Supple JVP flat; carotids brisk and full Clear to ausculation  Regular rate and rhythm, no murmurs gallops or rub Soft with active bowel sounds No clubbing cyanosis  Edema Alert and oriented, mild residual weakness Skin Warm and Dry  ECG demonstrates sinus rhythm at 60 with intervals 13/13/43 Left bundle branch  block  Assessment and  Plan   Nonischemic cardio myopathy  Congestive heart failure-chronic systolic  Left bundle branch block  Prior stroke with residual weakness  She is much improved and euvolemci  Will continue current meds  She is to receive an echo

## 2014-02-24 NOTE — Patient Instructions (Signed)
Your physician recommends that you continue on your current medications as directed. Please refer to the Current Medication list given to you today.  Remote monitoring is used to monitor your Pacemaker of ICD from home. This monitoring reduces the number of office visits required to check your device to one time per year. It allows us to keep an eye on the functioning of your device to ensure it is working properly. You are scheduled for a device check from home on 05/26/14. You may send your transmission at any time that day. If you have a wireless device, the transmission will be sent automatically. After your physician reviews your transmission, you will receive a postcard with your next transmission date.  Your physician wants you to follow-up in: 9 months with Dr. Klein.  You will receive a reminder letter in the mail two months in advance. If you don't receive a letter, please call our office to schedule the follow-up appointment.  

## 2014-03-05 ENCOUNTER — Ambulatory Visit (HOSPITAL_COMMUNITY)
Admission: RE | Admit: 2014-03-05 | Discharge: 2014-03-05 | Disposition: A | Discharge status: Home / Self Care | Payer: Medicare Other | Source: Ambulatory Visit | Attending: Internal Medicine | Admitting: Internal Medicine

## 2014-03-05 ENCOUNTER — Ambulatory Visit (HOSPITAL_BASED_OUTPATIENT_CLINIC_OR_DEPARTMENT_OTHER)
Admission: RE | Admit: 2014-03-05 | Discharge: 2014-03-05 | Disposition: A | Discharge status: Home / Self Care | Payer: Medicare Other | Source: Ambulatory Visit | Attending: Internal Medicine | Admitting: Internal Medicine

## 2014-03-05 VITALS — BP 106/58 | HR 62 | Wt 167.5 lb

## 2014-03-05 DIAGNOSIS — E785 Hyperlipidemia, unspecified: Secondary | ICD-10-CM | POA: Insufficient documentation

## 2014-03-05 DIAGNOSIS — I5022 Chronic systolic (congestive) heart failure: Secondary | ICD-10-CM

## 2014-03-05 DIAGNOSIS — I509 Heart failure, unspecified: Secondary | ICD-10-CM

## 2014-03-05 DIAGNOSIS — I639 Cerebral infarction, unspecified: Secondary | ICD-10-CM

## 2014-03-05 MED ORDER — SPIRONOLACTONE 25 MG PO TABS: 25.0000 mg | ORAL_TABLET | Freq: Every day | ORAL | Status: DC

## 2014-03-05 MED ORDER — LISINOPRIL 2.5 MG PO TABS: 2.5000 mg | ORAL_TABLET | Freq: Two times a day (BID) | ORAL | Status: DC

## 2014-03-05 NOTE — Progress Notes (Signed)
  Echocardiogram 2D Echocardiogram has been performed.  Arvil Chaco 03/05/2014, 10:27 AM

## 2014-03-05 NOTE — Progress Notes (Signed)
Patient ID: [name], female   DOB: 1950/08/11, 64 y.o.   MRN: [id number] PCP: Dr Jordan Likes EP: Dr Graciela Husbands.  Primary Cardiologist: Dr Shirlee Latch  Neurologist: Dr Pearlean Brownie  HPI: [name] is a 64 y.o. female with chronic systolic CHF with nonischemic cardiomypathy (no significant CAD) s/p Medtronic CRT-D, LBBB, mild-mod MR and CVA (09/2013).  Coronary angiography in 7/15 showed no significant CAD.  Admitted in 9/15 with right sided facial droop and expressive aphasia, CVA on MRI. Had TEE showing EF 15% .   Since last appointment, [name] says she has been doing well. She walks daily.  She has mild dyspnea walking up hills but does fine on flat ground.  No orthopnea/PND.  No palpitations. No stroke-like symptoms. Weight is stable.  She feels overall better with CRT-D.  She has been taking Lasix roughly every other day.   Optivol was reviewed today: about 2 hrs activity/day, no significant atrial fibrillation runs, fluid index < threshold with thoracic impedance trending up.   PMH: 1. Nonischemic cardiomyopathy: Echo (7/15) with severe LV dilation, EF 25% with diffuse hypokinesis, mild to moderate MR.  RHC/ LHC 08/08/13 with RA 4, PA 23/8, PCWP 11, CI 2.52; no CAD.  TEE (9/15) with EF 15-20%, severe global hypokinesis, no LV thrombus, moderate MR, normal RV.  Echo (10/2013): EF 25-30%, grade I DD, mild AI. Medtronic CRT-D (10/15).  Echo (2/16) with EF 25-30%, diffuse hypokinesis worse in the septum, grade I diastolic dysfunction, normal RV size and systolic function, mild-moderate MR.  2. CVA: 9/15 with dysarthria.  Carotids 9/15 with no significant CAD.  3. LBBB- Implant Medtronic CRT-D (10/2013) 4. Hyperlipidemia  Labs 09/23/13 K 5.1 Creatinine 0.68 Cholesterol 166 TGl 83 HDL 41, LDL 108 10/07/13: K 3.9, creatinine 0.65, LDL 108 11/03/13: K 3.8, creatinine 0.8 11/20/13: K 3.9 Creatinine 0.66  1/16: K 3.6, creatinine 0.77  SH: lives with her son and husband. Does not drink or smoke. Currently not  working.   FH: Mom had A fib and Heart Failure. Deceased 69        Sister has lupus  ROS: All systems negative except as listed in HPI, PMH and Problem List.   Current Outpatient Prescriptions  Medication Sig Dispense Refill  . atorvastatin (LIPITOR) 40 MG tablet Take 1 tablet (40 mg total) by mouth daily at 6 PM. 30 tablet 3  . carvedilol (COREG) 6.25 MG tablet Take 1 tablet (6.25 mg total) by mouth 2 (two) times daily with a meal. 60 tablet 3  . clopidogrel (PLAVIX) 75 MG tablet Take 1 tablet (75 mg total) by mouth daily. 30 tablet 3  . furosemide (LASIX) 20 MG tablet Take 1 tablet (20 mg total) by mouth daily as needed for fluid. 15 tablet 3  . lisinopril (PRINIVIL,ZESTRIL) 2.5 MG tablet Take 1 tablet (2.5 mg total) by mouth 2 (two) times daily. 90 tablet 3  . spironolactone (ALDACTONE) 25 MG tablet Take 1 tablet (25 mg total) by mouth daily. 30 tablet 3   No current facility-administered medications for this encounter.    Filed Vitals:   03/05/14 1037  BP: 106/58  Pulse: 62  Weight: 167 lb 8 oz (75.978 kg)  SpO2: 98%    PHYSICAL EXAM: General:  Well appearing. No resp difficulty. Son present HEENT: normal Neck: supple. JVP flat. Carotids 2+ bilaterally; no bruits. No lymphadenopathy or thryomegaly appreciated. Cor: PMI normal. Regular rate & rhythm. No rubs, gallops or murmurs. Lungs: clear Abdomen: soft, nontender, nondistended. No hepatosplenomegaly. No  bruits or masses. Good bowel sounds. Extremities: no cyanosis, clubbing, rash, edema Neuro: alert & orientedx3, cranial nerves grossly intact. Moves all 4 extremities w/o difficulty. Affect pleasant. Difficult word finding.    ASSESSMENT & PLAN:  1. Chronic Systolic Heart Failure: Nonischemic cardiomyopathy s/p CRT-D in 10/15, coronary angiography without CAD in 07/2013. I reviewed today's echo, EF remains 25-30%.  Euvolemic today on exam. Optivol was interrogated and also suggests stable volume status.  NYHA class II.   - Continue lasix 20 mg PRN.  - Continue Coreg 6.25 mg bid.  - Increase lisinopril to 2.5 mg twice a day.  - Increase spironolactone to 25 mg daily.  - BMET/BNP in 10 days.  - Reinforced daily weights, low salt food choices, and limiting fluid intake to < 2 liters per day.  2. LBBB:  s/p CRT-D. Feels much better. 3. CVA: ?Embolic.  No atrial fibrillation has been detected so far on device.  She will continue Plavix for now. If future device interrogation shows atrial fibrillation, she will need to be anticoagulated.    [name] MD  03/05/2014

## 2014-03-05 NOTE — Patient Instructions (Signed)
INCREASE Lisinopril to 2.5mg  tablet twice daily.  INCREASE Spironolactone to 25mg  tablet once daily.  Return March 2nd for lab work.  Follow up 2 months.  Do the following things EVERYDAY: 1) Weigh yourself in the morning before breakfast. Write it down and keep it in a log. 2) Take your medicines as prescribed 3) Eat low salt foods-Limit salt (sodium) to 2000 mg per day.  4) Stay as active as you can everyday 5) Limit all fluids for the day to less than 2 liters

## 2014-03-18 ENCOUNTER — Other Ambulatory Visit (HOSPITAL_COMMUNITY): Payer: Self-pay

## 2014-03-18 MED ORDER — ATORVASTATIN CALCIUM 40 MG PO TABS: 40.0000 mg | ORAL_TABLET | Freq: Every day | ORAL | Status: DC

## 2014-03-27 DIAGNOSIS — Z0289 Encounter for other administrative examinations: Secondary | ICD-10-CM

## 2014-04-01 ENCOUNTER — Encounter: Payer: Self-pay | Admitting: Family Medicine

## 2014-04-01 ENCOUNTER — Ambulatory Visit (INDEPENDENT_AMBULATORY_CARE_PROVIDER_SITE_OTHER): Payer: Medicare Other | Admitting: Family Medicine

## 2014-04-01 VITALS — BP 110/82 | Temp 98.2°F | Ht 65.0 in | Wt 163.0 lb

## 2014-04-01 DIAGNOSIS — H6991 Unspecified Eustachian tube disorder, right ear: Secondary | ICD-10-CM

## 2014-04-01 DIAGNOSIS — I639 Cerebral infarction, unspecified: Secondary | ICD-10-CM

## 2014-04-01 DIAGNOSIS — H6981 Other specified disorders of Eustachian tube, right ear: Secondary | ICD-10-CM

## 2014-04-01 DIAGNOSIS — Z0000 Encounter for general adult medical examination without abnormal findings: Secondary | ICD-10-CM

## 2014-04-01 DIAGNOSIS — I5022 Chronic systolic (congestive) heart failure: Secondary | ICD-10-CM

## 2014-04-01 MED ORDER — FLUTICASONE PROPIONATE 50 MCG/ACT NA SUSP: 2.0000 | Freq: Every day | NASAL | Status: DC

## 2014-04-01 MED ORDER — CARVEDILOL 6.25 MG PO TABS: 6.2500 mg | ORAL_TABLET | Freq: Two times a day (BID) | ORAL | Status: DC

## 2014-04-01 MED ORDER — CLOPIDOGREL BISULFATE 75 MG PO TABS: 75.0000 mg | ORAL_TABLET | Freq: Every day | ORAL | Status: DC

## 2014-04-01 NOTE — Progress Notes (Signed)
   Subjective:    Patient ID: [name], female    DOB: 06-29-1950, 64 y.o.   MRN: [id number]  HPI  [name] is here for to est care.   chronic systolic CHF with nonischemic cardiomypathy (no significant CAD) s/p Medtronic CRT-D, LBBB, mild-mod MR and CVA (09/2013).  She had a CVA last Sept and has been doing well since then.   Her husband and her walk at least once a day.  She hasn't been having any therapy since discharge from the hospital.  She is compliant with her medications. Pacemaker placed in Oct. Recent ECHO showed improvement of EF.   Water in her ear. Has a hx of this. No rhiniorrhea or cough. Some pain on the outside of her ear.    Current Outpatient Prescriptions on File Prior to Visit  Medication Sig Dispense Refill  . atorvastatin (LIPITOR) 40 MG tablet Take 1 tablet (40 mg total) by mouth daily at 6 PM. 90 tablet 3  . carvedilol (COREG) 6.25 MG tablet Take 1 tablet (6.25 mg total) by mouth 2 (two) times daily with a meal. 60 tablet 3  . clopidogrel (PLAVIX) 75 MG tablet Take 1 tablet (75 mg total) by mouth daily. 30 tablet 3  . furosemide (LASIX) 20 MG tablet Take 1 tablet (20 mg total) by mouth daily as needed for fluid. 15 tablet 3  . lisinopril (PRINIVIL,ZESTRIL) 2.5 MG tablet Take 1 tablet (2.5 mg total) by mouth 2 (two) times daily. 90 tablet 3  . spironolactone (ALDACTONE) 25 MG tablet Take 1 tablet (25 mg total) by mouth daily. 30 tablet 3   No current facility-administered medications on file prior to visit.    SHx: Lives with her husband   Health Maintenance: needs Tdap, colonoscopy and mammogram    Review of Systems See HPI     Objective:   Physical Exam BP 110/82 mmHg  Temp(Src) 98.2 F (36.8 C) (Oral)  Ht 5\' 5"  (1.651 m)  Wt 163 lb (73.936 kg)  BMI 27.12 kg/m2 Gen: NAD, alert, cooperative with exam,  HEENT: NCAT, Fluid in the right ear. No signs of infection  CV: RRR, good S1/S2,  Resp: CTABL, no wheezes, non-labored Abd: SNTND, BS  present, no guarding or organomegaly Skin: no rashes, normal turgor  Neuro: no gross deficits.       Assessment & Plan:

## 2014-04-01 NOTE — Patient Instructions (Signed)
Thank you for coming in,   I refilled the coreg and plavix.   Please follow up with me in 4-6 months.    Please feel free to call with any questions or concerns at any time, at [phone]. --Dr. Jordan Likes  Diet Recommendations  Starchy (carb) foods include: Bread, rice, pasta, potatoes, corn, crackers, bagels, muffins, all baked goods.   Protein foods include: Meat, fish, poultry, eggs, dairy foods, and beans such as pinto and kidney beans (beans also provide carbohydrate).   1. Eat at least 3 meals and 1-2 snacks per day. Never go more than 4-5 hours while awake without eating.  2. Limit starchy foods to TWO per meal and ONE per snack. ONE portion of a starchy  food is equal to the following:   - ONE slice of bread (or its equivalent, such as half of a hamburger bun).   - 1/2 cup of a "scoopable" starchy food such as potatoes or rice.   - 1 OUNCE (28 grams) of starchy snack foods such as crackers or pretzels (look on label).   - 15 grams of carbohydrate as shown on food label.  3. Both lunch and dinner should include a protein food, a carb food, and vegetables.   - Obtain twice as many veg's as protein or carbohydrate foods for both lunch and dinner.   - Try to keep frozen veg's on hand for a quick vegetable serving.     - Fresh or frozen veg's are best.  4. Breakfast should always include protein.

## 2014-04-03 ENCOUNTER — Encounter: Payer: Self-pay | Admitting: Internal Medicine

## 2014-04-03 ENCOUNTER — Telehealth: Payer: Self-pay | Admitting: Family Medicine

## 2014-04-03 ENCOUNTER — Telehealth: Payer: Self-pay | Admitting: *Deleted

## 2014-04-03 DIAGNOSIS — H6980 Other specified disorders of Eustachian tube, unspecified ear: Secondary | ICD-10-CM | POA: Insufficient documentation

## 2014-04-03 NOTE — Assessment & Plan Note (Signed)
Hasn't gotten colonoscopy. Is concerned about getting a Mammogram with ICD in place.

## 2014-04-03 NOTE — Assessment & Plan Note (Signed)
Fullness and pressure behind her ear. Happens in her right ear. No signs of infection on exam.  - flonase 1 puff BID  - f/u PRN

## 2014-04-03 NOTE — Telephone Encounter (Signed)
Pt called requesting antibiotics for her ear. Pt stated that PCP told her he might prescribe antibiotics if needed.  Pt stated she is still having pain that is affecting her balance.  Reviewed note from PCP 3/116/16; pt to follow up if needed.  Offered pt an appt for Monday with PCP; declined and wanted to wait for a phone call from PCP.  Clovis Pu, RN

## 2014-04-03 NOTE — Telephone Encounter (Signed)
Called pt and scheduled her a lab appointment for next week. Lamonte Sakai, April D

## 2014-04-03 NOTE — Assessment & Plan Note (Signed)
Appears to be doing well. Recent ECHO showed improved in EF. Has ICD placed and compliant with medications. She had questions about spironolactone and those were answered today.  - refilled coreg  - continue current medications.

## 2014-04-06 ENCOUNTER — Telehealth: Payer: Self-pay | Admitting: Family Medicine

## 2014-04-06 NOTE — Telephone Encounter (Signed)
Called spoke with patient about her ear pain.  Having pain in her right ear. It occurs intermittently throughout the day and at night. It is shooting down her into her jaw. The pain in her ear is severe. Described as shooting and improved with putting pressure on that side. Worse with swallowing and chewing. Hasn't tried anything yet.  Dizziness when she stands but this isn't her main complaint.   She will call clinic and try to be seen in a same day for tomorrow.  Encouraged to take tylenol and NOT NSAIDS. Didn't appear to be infectious last week but need a re-evaluation if continuing to get worse.  Myra Rude, MD PGY-2, [facility] 04/06/2014, 1:48 PM

## 2014-04-06 NOTE — Telephone Encounter (Signed)
Ear pain is no better and [place] sharp pains. undertood if no better dr would call in antibotid [facility]

## 2014-04-08 ENCOUNTER — Ambulatory Visit: Payer: Self-pay | Admitting: Family Medicine

## 2014-04-08 ENCOUNTER — Other Ambulatory Visit: Payer: Medicare Other

## 2014-04-08 DIAGNOSIS — I5022 Chronic systolic (congestive) heart failure: Secondary | ICD-10-CM

## 2014-04-08 LAB — BASIC METABOLIC PANEL
BUN: 14 mg/dL (ref 6–23)
CO2: 21 mEq/L (ref 19–32)
CREATININE: 0.73 mg/dL (ref 0.50–1.10)
Calcium: 9.4 mg/dL (ref 8.4–10.5)
Chloride: 108 mEq/L (ref 96–112)
GLUCOSE: 78 mg/dL (ref 70–99)
Potassium: 4.2 mEq/L (ref 3.5–5.3)
SODIUM: 141 meq/L (ref 135–145)

## 2014-04-08 NOTE — Progress Notes (Signed)
[facility] phlebotomist drew:  BMP 

## 2014-04-09 ENCOUNTER — Encounter: Payer: Self-pay | Admitting: Family Medicine

## 2014-04-16 ENCOUNTER — Ambulatory Visit (INDEPENDENT_AMBULATORY_CARE_PROVIDER_SITE_OTHER): Payer: Medicare Other | Admitting: Family Medicine

## 2014-04-16 ENCOUNTER — Encounter: Payer: Self-pay | Admitting: Family Medicine

## 2014-04-16 VITALS — BP 130/90 | HR 80 | Temp 98.9°F | Ht 65.0 in | Wt 164.0 lb

## 2014-04-16 DIAGNOSIS — K047 Periapical abscess without sinus: Secondary | ICD-10-CM | POA: Diagnosis not present

## 2014-04-16 MED ORDER — CLINDAMYCIN HCL 300 MG PO CAPS: 300.0000 mg | ORAL_CAPSULE | Freq: Four times a day (QID) | ORAL | Status: DC

## 2014-04-16 MED ORDER — NAPROXEN 500 MG PO TABS: 500.0000 mg | ORAL_TABLET | Freq: Two times a day (BID) | ORAL | Status: DC

## 2014-04-16 NOTE — Patient Instructions (Signed)
Abscessed Tooth An abscessed tooth is an infection around your tooth. It may be caused by holes or damage to the tooth (cavity) or a dental disease. An abscessed tooth causes mild to very bad pain in and around the tooth. See your dentist right away if you have tooth or gum pain. HOME CARE  Take your medicine as told. Finish it even if you start to feel better.  Do not drive after taking pain medicine.  Rinse your mouth (gargle) often with salt water ( teaspoon salt in 8 ounces of warm water).  Do not apply heat to the outside of your face. GET HELP RIGHT AWAY IF:   You have a temperature by mouth above 102 F (38.9 C), not controlled by medicine.  You have chills and a very bad headache.  You have problems breathing or swallowing.  Your mouth will not open.  You develop puffiness (swelling) on the neck or around the eye.  Your pain is not helped by medicine.  Your pain is getting worse instead of better. MAKE SURE YOU:   Understand these instructions.  Will watch your condition.  Will get help right away if you are not doing well or get worse. Document Released: 06/21/2007 Document Revised: 03/27/2011 Document Reviewed: 04/12/2010 Exit[employer] Patient Information 2015 [employer]. This information is not intended to replace advice given to you by your health care provider. Make sure you discuss any questions you have with your health care provider.  

## 2014-04-16 NOTE — Progress Notes (Signed)
[name] is a 64 y.o. female who presents today for R jaw pain  R jaw pain - Ongoing now for about two weeks.  Thought this was coming from her ear and tried flonase w/o much relief.  Has worsened despite taking tylenol multiple times per day over this time frame.  Pain worse in the lower R mandible with swelling.  Unable to masticate at this time due to pain.  Denies LAD fever or chills.    Past Medical History  Diagnosis Date  . GERD (gastroesophageal reflux disease)   . Arthritis   . PONV (postoperative nausea and vomiting)   . Systolic CHF     a. Dx 07/2013 - due to NICM. Cath normal cors. EF 25%.  [name] LBBB (left bundle branch block)   . Hypotension   . Mitral regurgitation     a. Mild-mod MR by echo 07/2013.  [name] Automatic implantable cardioverter-defibrillator in situ   . HLD (hyperlipidemia)   . CHF (congestive heart failure)   . Stroke 09/23/2013  . Cardiac resynchronization therapy defibrillator (CRT-D) in place     a. placed on 11/05/13.    History  Smoking status  . Never Smoker   Smokeless tobacco  . Never Used    Family History  Problem Relation Age of Onset  . Congestive Heart Failure Mother     Current Outpatient Prescriptions on File Prior to Visit  Medication Sig Dispense Refill  . atorvastatin (LIPITOR) 40 MG tablet Take 1 tablet (40 mg total) by mouth daily at 6 PM. 90 tablet 3  . carvedilol (COREG) 6.25 MG tablet Take 1 tablet (6.25 mg total) by mouth 2 (two) times daily with a meal. 60 tablet 3  . clopidogrel (PLAVIX) 75 MG tablet Take 1 tablet (75 mg total) by mouth daily. 90 tablet 1  . fluticasone (FLONASE) 50 MCG/ACT nasal spray Place 2 sprays into both nostrils daily. 16 g 6  . furosemide (LASIX) 20 MG tablet Take 1 tablet (20 mg total) by mouth daily as needed for fluid. 15 tablet 3  . lisinopril (PRINIVIL,ZESTRIL) 2.5 MG tablet Take 1 tablet (2.5 mg total) by mouth 2 (two) times daily. 90 tablet 3  . spironolactone (ALDACTONE) 25 MG tablet Take 1  tablet (25 mg total) by mouth daily. 30 tablet 3   No current facility-administered medications on file prior to visit.    ROS: Per HPI.  All other systems reviewed and are negative.   Physical Exam Filed Vitals:   04/16/14 0848  BP: 130/90  Pulse: 80  Temp: 98.9 F (37.2 C)    Physical Examination: General appearance - alert, well appearing, and in no distress Mouth - MMM.  Tooth 29/30 both with periodontal abscess formation  Neck - supple, no significant adenopathy Ear: TMI B/L, no suppurative effusion     Chemistry      Component Value Date/Time   NA 141 04/08/2014 0900   K 4.2 04/08/2014 0900   CL 108 04/08/2014 0900   CO2 21 04/08/2014 0900   BUN 14 04/08/2014 0900   CREATININE 0.73 04/08/2014 0900   CREATININE 0.77 02/02/2014 1106      Component Value Date/Time   CALCIUM 9.4 04/08/2014 0900   ALKPHOS 93 09/23/2013 1029   AST 31 09/23/2013 1029   ALT 15 09/23/2013 1029   BILITOT 0.5 09/23/2013 1029

## 2014-04-16 NOTE — Assessment & Plan Note (Signed)
#  29/30 of R lower mandible - Start clindamycin 300 mg QID - Naproxen 500 mg BID (On plavix but no other antiplatelets, warned that this could increase risk of MI/bleeding but short term and pt understands risks and benefits - Dental sheet for medicaid given to have extraction of tooth immediately.  - F/U PRN

## 2014-05-26 ENCOUNTER — Ambulatory Visit (INDEPENDENT_AMBULATORY_CARE_PROVIDER_SITE_OTHER): Payer: Medicare Other | Admitting: *Deleted

## 2014-05-26 ENCOUNTER — Encounter: Payer: Self-pay | Admitting: Internal Medicine

## 2014-05-26 ENCOUNTER — Other Ambulatory Visit: Payer: Self-pay

## 2014-05-26 ENCOUNTER — Telehealth: Payer: Self-pay | Admitting: Internal Medicine

## 2014-05-26 ENCOUNTER — Telehealth: Payer: Self-pay | Admitting: Cardiology

## 2014-05-26 DIAGNOSIS — I429 Cardiomyopathy, unspecified: Secondary | ICD-10-CM | POA: Diagnosis not present

## 2014-05-26 DIAGNOSIS — I428 Other cardiomyopathies: Secondary | ICD-10-CM

## 2014-05-26 DIAGNOSIS — I5022 Chronic systolic (congestive) heart failure: Secondary | ICD-10-CM | POA: Diagnosis not present

## 2014-05-26 NOTE — Telephone Encounter (Signed)
Spoke w/ pt and she gave me model # and serial #. Called [employer] tech services and have them update information in pt carelink profile. Informed pt that transmission was received. Pt verbalized understanding.

## 2014-05-26 NOTE — Telephone Encounter (Signed)
Confirmed remote transmission w/ pt husband.   

## 2014-05-26 NOTE — Telephone Encounter (Signed)
New message       Did you receive her transmission?

## 2014-05-26 NOTE — Progress Notes (Signed)
Remote ICD transmission.   

## 2014-05-27 MED ORDER — FUROSEMIDE 20 MG PO TABS: 20.0000 mg | ORAL_TABLET | Freq: Every day | ORAL | Status: DC | PRN

## 2014-05-29 LAB — CUP PACEART REMOTE DEVICE CHECK
Battery Remaining Longevity: 106 mo
Battery Voltage: 3.01 V
Brady Statistic AP VP Percent: 25.93 %
Brady Statistic AP VS Percent: 0.46 %
Brady Statistic AS VP Percent: 72.58 %
Brady Statistic RA Percent Paced: 26.39 %
Brady Statistic RV Percent Paced: 1.17 %
HighPow Impedance: 79 Ohm
Lead Channel Impedance Value: 418 Ohm
Lead Channel Impedance Value: 456 Ohm
Lead Channel Impedance Value: 513 Ohm
Lead Channel Impedance Value: 551 Ohm
Lead Channel Impedance Value: 817 Ohm
Lead Channel Impedance Value: 836 Ohm
Lead Channel Pacing Threshold Amplitude: 0.25 V
Lead Channel Pacing Threshold Pulse Width: 0.4 ms
Lead Channel Sensing Intrinsic Amplitude: 4.375 mV
Lead Channel Setting Pacing Amplitude: 1.5 V
Lead Channel Setting Pacing Amplitude: 2 V
Lead Channel Setting Pacing Amplitude: 2 V
Lead Channel Setting Sensing Sensitivity: 0.3 mV
MDC IDC MSMT LEADCHNL LV IMPEDANCE VALUE: 532 Ohm
MDC IDC MSMT LEADCHNL LV IMPEDANCE VALUE: 608 Ohm
MDC IDC MSMT LEADCHNL LV IMPEDANCE VALUE: 760 Ohm
MDC IDC MSMT LEADCHNL LV IMPEDANCE VALUE: 836 Ohm
MDC IDC MSMT LEADCHNL LV IMPEDANCE VALUE: 836 Ohm
MDC IDC MSMT LEADCHNL LV PACING THRESHOLD AMPLITUDE: 1.125 V
MDC IDC MSMT LEADCHNL LV PACING THRESHOLD PULSEWIDTH: 0.5 ms
MDC IDC MSMT LEADCHNL RA IMPEDANCE VALUE: 703 Ohm
MDC IDC MSMT LEADCHNL RV IMPEDANCE VALUE: 418 Ohm
MDC IDC MSMT LEADCHNL RV PACING THRESHOLD AMPLITUDE: 0.625 V
MDC IDC MSMT LEADCHNL RV PACING THRESHOLD PULSEWIDTH: 0.4 ms
MDC IDC MSMT LEADCHNL RV SENSING INTR AMPL: 10.625 mV
MDC IDC SESS DTM: 20160510123356
MDC IDC SET LEADCHNL LV PACING PULSEWIDTH: 0.5 ms
MDC IDC SET LEADCHNL RV PACING PULSEWIDTH: 0.4 ms
MDC IDC SET ZONE DETECTION INTERVAL: 450 ms
MDC IDC STAT BRADY AS VS PERCENT: 1.03 %
Zone Setting Detection Interval: 250 ms
Zone Setting Detection Interval: 300 ms
Zone Setting Detection Interval: 350 ms

## 2014-06-08 ENCOUNTER — Encounter: Payer: Self-pay | Admitting: Cardiology

## 2014-06-11 ENCOUNTER — Ambulatory Visit (HOSPITAL_COMMUNITY)
Admission: RE | Admit: 2014-06-11 | Discharge: 2014-06-11 | Disposition: A | Discharge status: Home / Self Care | Payer: Medicare Other | Source: Ambulatory Visit | Attending: Internal Medicine | Admitting: Internal Medicine

## 2014-06-11 VITALS — BP 102/66 | HR 96 | Wt 165.0 lb

## 2014-06-11 DIAGNOSIS — E785 Hyperlipidemia, unspecified: Secondary | ICD-10-CM | POA: Insufficient documentation

## 2014-06-11 DIAGNOSIS — I447 Left bundle-branch block, unspecified: Secondary | ICD-10-CM | POA: Diagnosis not present

## 2014-06-11 DIAGNOSIS — I5022 Chronic systolic (congestive) heart failure: Secondary | ICD-10-CM | POA: Diagnosis not present

## 2014-06-11 DIAGNOSIS — Z79899 Other long term (current) drug therapy: Secondary | ICD-10-CM | POA: Insufficient documentation

## 2014-06-11 DIAGNOSIS — I429 Cardiomyopathy, unspecified: Secondary | ICD-10-CM

## 2014-06-11 DIAGNOSIS — Z7902 Long term (current) use of antithrombotics/antiplatelets: Secondary | ICD-10-CM | POA: Insufficient documentation

## 2014-06-11 DIAGNOSIS — Z8673 Personal history of transient ischemic attack (TIA), and cerebral infarction without residual deficits: Secondary | ICD-10-CM | POA: Diagnosis not present

## 2014-06-11 DIAGNOSIS — I428 Other cardiomyopathies: Secondary | ICD-10-CM

## 2014-06-11 LAB — BASIC METABOLIC PANEL
ANION GAP: 11 (ref 5–15)
BUN: 16 mg/dL (ref 6–20)
CO2: 24 mmol/L (ref 22–32)
CREATININE: 0.72 mg/dL (ref 0.44–1.00)
Calcium: 9.8 mg/dL (ref 8.9–10.3)
Chloride: 103 mmol/L (ref 101–111)
GFR calc non Af Amer: >60 mL/min (ref >60)
GLUCOSE: 76 mg/dL (ref 65–99)
POTASSIUM: 4 mmol/L (ref 3.5–5.1)
SODIUM: 138 mmol/L (ref 135–145)

## 2014-06-11 NOTE — Patient Instructions (Signed)
Lab today  Your physician recommends that you schedule a follow-up appointment in: 3 months  

## 2014-06-11 NOTE — Progress Notes (Signed)
Patient ID: [name], female   DOB: April 12, 1950, 64 y.o.   MRN: [id number] PCP: Dr [name] EP: Dr [name].  Primary Cardiologist: Dr [name]  Neurologist: Dr [name]  HPI: [name] is a 64 y.o. female with chronic systolic CHF with nonischemic cardiomypathy (no significant CAD) s/p Medtronic CRT-D, LBBB, mild-mod MR and CVA (09/2013).  Coronary angiography in 7/15 showed no significant CAD.  Admitted in 9/15 with right sided facial droop and expressive aphasia, CVA on MRI. Had TEE showing EF 15% .   She returns for follow up. Last lisinopril and spiro increased. Taking lasix daily over the last couple of weeks. Taking all medications. Able to walk 6 miles. Denies SOB/PND/Orthopnea/CP. Weight at home 162 -163 pounds.    PMH: 1. Nonischemic cardiomyopathy: Echo (7/15) with severe LV dilation, EF 25% with diffuse hypokinesis, mild to moderate MR.  RHC/ LHC 08/08/13 with RA 4, PA 23/8, PCWP 11, CI 2.52; no CAD.  TEE (9/15) with EF 15-20%, severe global hypokinesis, no LV thrombus, moderate MR, normal RV.  Echo (10/2013): EF 25-30%, grade I DD, mild AI. Medtronic CRT-D (10/15).  Echo (2/16) with EF 25-30%, diffuse hypokinesis worse in the septum, grade I diastolic dysfunction, normal RV size and systolic function, mild-moderate MR.  2. CVA: 9/15 with dysarthria.  Carotids 9/15 with no significant CAD.  3. LBBB- Implant Medtronic CRT-D (10/2013) 4. Hyperlipidemia  Labs 09/23/13 K 5.1 Creatinine 0.68 Cholesterol 166 TGl 83 HDL 41, LDL 108 10/07/13: K 3.9, creatinine 0.65, LDL 108 11/03/13: K 3.8, creatinine 0.8 11/20/13: K 3.9 Creatinine 0.66  1/16: K 3.6, creatinine 0.77 04/08/2014: K 4.2 Creatinine 0.73  SH: lives with her son and husband. Does not drink or smoke. Currently not working.   FH: Mom had A fib and Heart Failure. Deceased 75        Sister has lupus  ROS: All systems negative except as listed in HPI, PMH and Problem List.   Current Outpatient Prescriptions  Medication Sig  Dispense Refill  . atorvastatin (LIPITOR) 40 MG tablet Take 1 tablet (40 mg total) by mouth daily at 6 PM. 90 tablet 3  . carvedilol (COREG) 6.25 MG tablet Take 1 tablet (6.25 mg total) by mouth 2 (two) times daily with a meal. 60 tablet 3  . clopidogrel (PLAVIX) 75 MG tablet Take 1 tablet (75 mg total) by mouth daily. 90 tablet 1  . fluticasone (FLONASE) 50 MCG/ACT nasal spray Place 2 sprays into both nostrils daily. 16 g 6  . furosemide (LASIX) 20 MG tablet Take 1 tablet (20 mg total) by mouth daily as needed for fluid. 15 tablet 3  . lisinopril (PRINIVIL,ZESTRIL) 2.5 MG tablet Take 1 tablet (2.5 mg total) by mouth 2 (two) times daily. 90 tablet 3  . spironolactone (ALDACTONE) 25 MG tablet Take 1 tablet (25 mg total) by mouth daily. 30 tablet 3  . naproxen (NAPROSYN) 500 MG tablet Take 1 tablet (500 mg total) by mouth 2 (two) times daily with a meal. (Patient not taking: Reported on 06/11/2014) 30 tablet 0   No current facility-administered medications for this encounter.    Filed Vitals:   06/11/14 1418  BP: 102/66  Pulse: 96  Weight: 165 lb (74.844 kg)  SpO2: 99%    PHYSICAL EXAM: General:  Well appearing. No resp difficulty. Son present HEENT: normal Neck: supple. JVP flat. Carotids 2+ bilaterally; no bruits. No lymphadenopathy or thryomegaly appreciated. Cor: PMI normal. Regular rate & rhythm. No rubs, gallops or murmurs. Lungs: clear  Abdomen: soft, nontender, nondistended. No hepatosplenomegaly. No bruits or masses. Good bowel sounds. Extremities: no cyanosis, clubbing, rash, edema Neuro: alert & orientedx3, cranial nerves grossly intact. Moves all 4 extremities w/o difficulty. Affect pleasant. Difficult word finding.    ASSESSMENT & PLAN:  1. Chronic Systolic Heart Failure: Nonischemic cardiomyopathy s/p CRT-D in 10/15, coronary angiography without CAD in 07/2013. March 2016  EF remains 25-30%.   NYHA class II. - Continue lasix 20 mg PRN.  - Continue Coreg 6.25 mg bid.  -  Continue lisinopril to 2.5 mg twice a day.  - Continue spironolactone to 25 mg daily.  No uptitration with soft BP.  - Reinforced daily weights, low salt food choices, and limiting fluid intake to < 2 liters per day.  2. LBBB:  s/p CRT-D. Feels much better. 3. CVA: ?Embolic.  No atrial fibrillation has been detected so far on device.  She will continue Plavix for now. If future device interrogation shows atrial fibrillation, she will need to be anticoagulated.    Check BMET now. Follow up in 3 months Danielle Leipold NP-C   06/11/2014

## 2014-06-24 ENCOUNTER — Other Ambulatory Visit: Payer: Self-pay | Admitting: Family Medicine

## 2014-06-24 DIAGNOSIS — I5022 Chronic systolic (congestive) heart failure: Secondary | ICD-10-CM

## 2014-06-29 ENCOUNTER — Encounter: Payer: Self-pay | Admitting: Family Medicine

## 2014-06-29 ENCOUNTER — Ambulatory Visit (INDEPENDENT_AMBULATORY_CARE_PROVIDER_SITE_OTHER): Payer: Medicare Other | Admitting: Family Medicine

## 2014-06-29 VITALS — BP 106/91 | HR 73 | Temp 98.0°F | Ht 65.0 in | Wt 165.8 lb

## 2014-06-29 DIAGNOSIS — I5022 Chronic systolic (congestive) heart failure: Secondary | ICD-10-CM

## 2014-06-29 NOTE — Patient Instructions (Signed)
Thank you for coming in,   I think you look good today. Continue to walk .   Please bring all of your medications with you to each visit.    Please feel free to call with any questions or concerns at any time, at [phone]. --Dr. Jordan Likes

## 2014-06-29 NOTE — Progress Notes (Signed)
   Subjective:    Patient ID: [name], female    DOB: 20-Apr-1950, 64 y.o.   MRN: [id number]  HPI  [name] is here for f/u.  Systolic CHF: Thinks she is retaining fluid. Her weight is unchanged. No PND, orthopnea. She is feeling well. She is walking and swimming. Her ICD was evaluated in May and she has follow up in August.    Current Outpatient Prescriptions on File Prior to Visit  Medication Sig Dispense Refill  . atorvastatin (LIPITOR) 40 MG tablet Take 1 tablet (40 mg total) by mouth daily at 6 PM. 90 tablet 3  . carvedilol (COREG) 6.25 MG tablet TAKE ONE TABLET BY MOUTH TWICE DAILY WITH A MEAL 60 tablet 3  . clopidogrel (PLAVIX) 75 MG tablet Take 1 tablet (75 mg total) by mouth daily. 90 tablet 1  . fluticasone (FLONASE) 50 MCG/ACT nasal spray Place 2 sprays into both nostrils daily. 16 g 6  . furosemide (LASIX) 20 MG tablet Take 1 tablet (20 mg total) by mouth daily as needed for fluid. 15 tablet 3  . lisinopril (PRINIVIL,ZESTRIL) 2.5 MG tablet Take 1 tablet (2.5 mg total) by mouth 2 (two) times daily. 90 tablet 3  . naproxen (NAPROSYN) 500 MG tablet Take 1 tablet (500 mg total) by mouth 2 (two) times daily with a meal. (Patient not taking: Reported on 06/11/2014) 30 tablet 0  . spironolactone (ALDACTONE) 25 MG tablet Take 1 tablet (25 mg total) by mouth daily. 30 tablet 3   No current facility-administered medications on file prior to visit.   Review of Systems See HPI     Objective:   Physical Exam BP 106/91 mmHg  Pulse 73  Temp(Src) 98 F (36.7 C) (Oral)  Ht 5\' 5"  (1.651 m)  Wt 165 lb 12.8 oz (75.206 kg)  BMI 27.59 kg/m2 Gen: NAD, alert, cooperative with exam,  CV: RRR, good S1/S2, no murmur, no edema,  Resp: CTABL, no wheezes, non-labored Skin: no rashes, normal turgor  Neuro: no gross deficits.        Assessment & Plan:

## 2014-06-29 NOTE — Assessment & Plan Note (Signed)
Looks well. No changes today.

## 2014-08-25 ENCOUNTER — Telehealth: Payer: Self-pay | Admitting: Internal Medicine

## 2014-08-25 ENCOUNTER — Ambulatory Visit (INDEPENDENT_AMBULATORY_CARE_PROVIDER_SITE_OTHER): Payer: Medicare Other | Admitting: *Deleted

## 2014-08-25 DIAGNOSIS — I428 Other cardiomyopathies: Secondary | ICD-10-CM

## 2014-08-25 DIAGNOSIS — I429 Cardiomyopathy, unspecified: Secondary | ICD-10-CM

## 2014-08-25 DIAGNOSIS — I5022 Chronic systolic (congestive) heart failure: Secondary | ICD-10-CM

## 2014-08-25 NOTE — Progress Notes (Signed)
Remote ICD transmission.   

## 2014-08-25 NOTE — Telephone Encounter (Signed)
Pt made aware transmission was received. 

## 2014-08-25 NOTE — Telephone Encounter (Signed)
New message     Pt calling to check if we received her transmission Please call to discuss

## 2014-08-31 LAB — CUP PACEART REMOTE DEVICE CHECK
Battery Remaining Longevity: 104 mo
Battery Voltage: 3 V
Brady Statistic RA Percent Paced: 18.19 %
Date Time Interrogation Session: 20160809121859
HIGH POWER IMPEDANCE MEASURED VALUE: 88 Ohm
Lead Channel Impedance Value: 418 Ohm
Lead Channel Impedance Value: 418 Ohm
Lead Channel Impedance Value: 513 Ohm
Lead Channel Impedance Value: 532 Ohm
Lead Channel Impedance Value: 608 Ohm
Lead Channel Impedance Value: 665 Ohm
Lead Channel Impedance Value: 760 Ohm
Lead Channel Impedance Value: 779 Ohm
Lead Channel Impedance Value: 817 Ohm
Lead Channel Impedance Value: 836 Ohm
Lead Channel Pacing Threshold Pulse Width: 0.4 ms
Lead Channel Pacing Threshold Pulse Width: 0.4 ms
Lead Channel Pacing Threshold Pulse Width: 0.5 ms
Lead Channel Sensing Intrinsic Amplitude: 10.625 mV
Lead Channel Sensing Intrinsic Amplitude: 10.625 mV
Lead Channel Sensing Intrinsic Amplitude: 4.375 mV
Lead Channel Setting Pacing Amplitude: 1.5 V
Lead Channel Setting Pacing Amplitude: 2 V
Lead Channel Setting Pacing Pulse Width: 0.4 ms
Lead Channel Setting Pacing Pulse Width: 0.5 ms
Lead Channel Setting Sensing Sensitivity: 0.3 mV
MDC IDC MSMT LEADCHNL LV IMPEDANCE VALUE: 418 Ohm
MDC IDC MSMT LEADCHNL LV IMPEDANCE VALUE: 513 Ohm
MDC IDC MSMT LEADCHNL LV IMPEDANCE VALUE: 779 Ohm
MDC IDC MSMT LEADCHNL LV PACING THRESHOLD AMPLITUDE: 1.125 V
MDC IDC MSMT LEADCHNL RA PACING THRESHOLD AMPLITUDE: 0.375 V
MDC IDC MSMT LEADCHNL RA SENSING INTR AMPL: 4.375 mV
MDC IDC MSMT LEADCHNL RV PACING THRESHOLD AMPLITUDE: 0.75 V
MDC IDC SET LEADCHNL RV PACING AMPLITUDE: 2 V
MDC IDC SET ZONE DETECTION INTERVAL: 350 ms
MDC IDC STAT BRADY AP VP PERCENT: 17.87 %
MDC IDC STAT BRADY AP VS PERCENT: 0.32 %
MDC IDC STAT BRADY AS VP PERCENT: 80.67 %
MDC IDC STAT BRADY AS VS PERCENT: 1.14 %
MDC IDC STAT BRADY RV PERCENT PACED: 1.15 %
Zone Setting Detection Interval: 250 ms
Zone Setting Detection Interval: 300 ms
Zone Setting Detection Interval: 450 ms

## 2014-09-04 ENCOUNTER — Other Ambulatory Visit: Payer: Self-pay | Admitting: Family Medicine

## 2014-09-11 ENCOUNTER — Encounter (HOSPITAL_COMMUNITY): Payer: Self-pay

## 2014-09-11 ENCOUNTER — Ambulatory Visit (HOSPITAL_COMMUNITY)
Admission: RE | Admit: 2014-09-11 | Discharge: 2014-09-11 | Disposition: A | Discharge status: Home / Self Care | Payer: Medicare Other | Source: Ambulatory Visit | Attending: Cardiology | Admitting: Cardiology

## 2014-09-11 VITALS — BP 112/61 | HR 60 | Resp 18 | Wt 167.5 lb

## 2014-09-11 DIAGNOSIS — Z95810 Presence of automatic (implantable) cardiac defibrillator: Secondary | ICD-10-CM | POA: Diagnosis not present

## 2014-09-11 DIAGNOSIS — I429 Cardiomyopathy, unspecified: Secondary | ICD-10-CM

## 2014-09-11 DIAGNOSIS — Z7902 Long term (current) use of antithrombotics/antiplatelets: Secondary | ICD-10-CM | POA: Diagnosis not present

## 2014-09-11 DIAGNOSIS — I447 Left bundle-branch block, unspecified: Secondary | ICD-10-CM | POA: Insufficient documentation

## 2014-09-11 DIAGNOSIS — E785 Hyperlipidemia, unspecified: Secondary | ICD-10-CM

## 2014-09-11 DIAGNOSIS — Z79899 Other long term (current) drug therapy: Secondary | ICD-10-CM | POA: Diagnosis not present

## 2014-09-11 DIAGNOSIS — I5022 Chronic systolic (congestive) heart failure: Secondary | ICD-10-CM | POA: Insufficient documentation

## 2014-09-11 DIAGNOSIS — I428 Other cardiomyopathies: Secondary | ICD-10-CM | POA: Insufficient documentation

## 2014-09-11 DIAGNOSIS — Z8673 Personal history of transient ischemic attack (TIA), and cerebral infarction without residual deficits: Secondary | ICD-10-CM | POA: Diagnosis not present

## 2014-09-11 LAB — BASIC METABOLIC PANEL
Anion gap: 9 (ref 5–15)
BUN: 12 mg/dL (ref 6–20)
CO2: 23 mmol/L (ref 22–32)
CREATININE: 0.7 mg/dL (ref 0.44–1.00)
Calcium: 9.5 mg/dL (ref 8.9–10.3)
Chloride: 108 mmol/L (ref 101–111)
GFR calc Af Amer: >60 mL/min (ref >60)
Glucose, Bld: 74 mg/dL (ref 65–99)
POTASSIUM: 3.5 mmol/L (ref 3.5–5.1)
SODIUM: 140 mmol/L (ref 135–145)

## 2014-09-11 LAB — BRAIN NATRIURETIC PEPTIDE: B NATRIURETIC PEPTIDE 5: 23.5 pg/mL (ref 0.0–100.0)

## 2014-09-11 LAB — LIPID PANEL
Cholesterol: 142 mg/dL (ref 0–200)
HDL: 44 mg/dL (ref >40)
LDL Cholesterol: 79 mg/dL (ref 0–99)
Total CHOL/HDL Ratio: 3.2 RATIO
Triglycerides: 95 mg/dL (ref <150)
VLDL: 19 mg/dL (ref 0–40)

## 2014-09-11 MED ORDER — LISINOPRIL 5 MG PO TABS: 5.0000 mg | ORAL_TABLET | Freq: Two times a day (BID) | ORAL | Status: DC

## 2014-09-11 MED ORDER — FUROSEMIDE 40 MG PO TABS: 40.0000 mg | ORAL_TABLET | Freq: Every day | ORAL | Status: DC

## 2014-09-11 NOTE — Patient Instructions (Signed)
Routine lab work today. Will notify you of abnormal results, otherwise no news is good news!  INCREASE Lasix to 40mg  once daily.  INCREASE Lisinopril to 5 mg twice daily.  Will schedule you for echocardiogram at [facility]. Address: [address] ([address] floor), [place], [place] [address]  Phone: [phone]  Return in 1-2 weeks for lab work.  Follow up 2 months with Dr. Shirlee Latch.  Do the following things EVERYDAY: 1) Weigh yourself in the morning before breakfast. Write it down and keep it in a log. 2) Take your medicines as prescribed 3) Eat low salt foods-Limit salt (sodium) to 2000 mg per day.  4) Stay as active as you can everyday 5) Limit all fluids for the day to less than 2 liters

## 2014-09-11 NOTE — Progress Notes (Signed)
Advanced Heart Failure Medication Review by a Pharmacist  Does the patient  feel that his/her medications are working for him/her?  yes  Has the patient been experiencing any side effects to the medications prescribed?  no  Does the patient measure his/her own blood pressure or blood glucose at home?  no   Does the patient have any problems obtaining medications due to transportation or finances?   no  Understanding of regimen: good Understanding of indications: good Potential of compliance: good    Pharmacist comments:  [name] is a 64 yo F presenting without a medication list. She seems to have a good understanding of her medication regimen and the importance of consistent use but was somewhat confused about dosages. She states that she no longer takes spironolactone since around June due to excessive dizziness. She also states that she usually takes her lasix on a daily basis to keep her weight around 162-164 lb. She did not have any other questions or concerns about her medications at this time.   Tyler Deis. Bonnye Fava, PharmD, BCPS, CPP Clinical Pharmacist Pager: [phone] Phone: [phone] 09/11/2014 8:50 AM

## 2014-09-13 NOTE — Progress Notes (Signed)
Patient ID: [name], female   DOB: 05/11/1950, 64 y.o.   MRN: [id number] PCP: Dr Jordan Likes EP: Dr Graciela Husbands.  Primary Cardiologist: Dr Shirlee Latch  Neurologist: Dr Pearlean Brownie  HPI: [name] is a 64 y.o. female with chronic systolic CHF with nonischemic cardiomypathy (no significant CAD) s/p Medtronic CRT-D, LBBB, mild-mod MR and CVA (09/2013).  Coronary angiography in 7/15 showed no significant CAD.  Admitted in 9/15 with right sided facial droop and expressive aphasia, CVA on MRI. Had TEE showing EF 15% .   She returns for follow up. She is walking 2-3 miles/day.  She is short of breath with hills and stairs.  No orthopnea/PND.  No chest pain.  No stroke-like symptoms.  She was started on spironolactone but has stopped it due to dizziness.  She felt really bad on this med and does not want to retry it.  Weight is up 2 lbs.    Optivol: Fluid index nearing threshold, impedance decreasing.  2 hrs/day activity.  No atrial fibrillation.   PMH: 1. Nonischemic cardiomyopathy: Echo (7/15) with severe LV dilation, EF 25% with diffuse hypokinesis, mild to moderate MR.  RHC/ LHC 08/08/13 with RA 4, PA 23/8, PCWP 11, CI 2.52; no CAD.  TEE (9/15) with EF 15-20%, severe global hypokinesis, no LV thrombus, moderate MR, normal RV.  Echo (10/2013): EF 25-30%, grade I DD, mild AI. Medtronic CRT-D (10/15).  Echo (2/16) with EF 25-30%, diffuse hypokinesis worse in the septum, grade I diastolic dysfunction, normal RV size and systolic function, mild-moderate MR.  2. CVA: 9/15 with dysarthria.  Carotids 9/15 with no significant CAD.  3. LBBB- Implant Medtronic CRT-D (10/2013) 4. Hyperlipidemia  Labs 09/23/13 K 5.1 Creatinine 0.68 Cholesterol 166 TGl 83 HDL 41, LDL 108 10/07/13: K 3.9, creatinine 0.65, LDL 108 11/03/13: K 3.8, creatinine 0.8 11/20/13: K 3.9 Creatinine 0.66  1/16: K 3.6, creatinine 0.77 04/08/2014: K 4.2 Creatinine 0.73 5/16: K 4, creatinine 0.72  SH: lives with her son and husband. Does not drink or  smoke. Currently not working.   FH: Mom had A fib and Heart Failure. Deceased 43        Sister has lupus  ROS: All systems negative except as listed in HPI, PMH and Problem List.   Current Outpatient Prescriptions  Medication Sig Dispense Refill  . atorvastatin (LIPITOR) 40 MG tablet Take 1 tablet (40 mg total) by mouth daily at 6 PM. 90 tablet 3  . carvedilol (COREG) 6.25 MG tablet TAKE ONE TABLET BY MOUTH TWICE DAILY WITH A MEAL 60 tablet 3  . clopidogrel (PLAVIX) 75 MG tablet TAKE ONE TABLET BY MOUTH EVERY DAY 90 tablet 1  . fluticasone (FLONASE) 50 MCG/ACT nasal spray Place 2 sprays into both nostrils daily as needed for allergies or rhinitis.    . furosemide (LASIX) 40 MG tablet Take 1 tablet (40 mg total) by mouth daily. 30 tablet 6  . lisinopril (PRINIVIL,ZESTRIL) 5 MG tablet Take 1 tablet (5 mg total) by mouth 2 (two) times daily. 60 tablet 6   No current facility-administered medications for this encounter.    Filed Vitals:   09/11/14 0839  BP: 112/61  Pulse: 60  Resp: 18  Weight: 167 lb 8 oz (75.978 kg)  SpO2: 100%    PHYSICAL EXAM: General:  Well appearing. No resp difficulty. Son present HEENT: normal Neck: supple. JVP 8 cm. Carotids 2+ bilaterally; no bruits. No lymphadenopathy or thryomegaly appreciated. Cor: PMI normal. Regular rate & rhythm. No rubs, gallops or murmurs.  Lungs: clear Abdomen: soft, nontender, nondistended. No hepatosplenomegaly. No bruits or masses. Good bowel sounds. Extremities: no cyanosis, clubbing, rash, edema Neuro: alert & orientedx3, cranial nerves grossly intact. Moves all 4 extremities w/o difficulty. Affect pleasant. Difficult word finding.   ASSESSMENT & PLAN:  1. Chronic Systolic Heart Failure: Nonischemic cardiomyopathy s/p CRT-D in 10/15, coronary angiography without CAD in 07/2013. Echo done in 2/16, EF remains 25-30%.   NYHA class II. Optivol suggestive of development of mild fluid overload. - Increase Lasix to 40 mg daily  with BMET/BNP today and repeat in 10 days.  - Continue Coreg 6.25 mg bid.  - Increase lisinopril to 5 mg bid.  - Very dizzy with spironolactone, does not want to retry.   - Repeat echo post-CRT. 2. LBBB:  s/p CRT-D. Feels much better. 3. CVA: ?Embolic.  No atrial fibrillation has been detected so far on device.  She will continue Plavix for now. If future device interrogation shows atrial fibrillation, she will need to be anticoagulated.    Followup in 2 months.   [name] 09/13/2014

## 2014-09-15 ENCOUNTER — Encounter (HOSPITAL_COMMUNITY): Payer: Self-pay | Admitting: *Deleted

## 2014-09-16 ENCOUNTER — Ambulatory Visit (HOSPITAL_COMMUNITY): Payer: Medicare Other | Attending: Cardiovascular Disease

## 2014-09-16 ENCOUNTER — Other Ambulatory Visit: Payer: Self-pay

## 2014-09-16 DIAGNOSIS — I340 Nonrheumatic mitral (valve) insufficiency: Secondary | ICD-10-CM | POA: Diagnosis not present

## 2014-09-16 DIAGNOSIS — I517 Cardiomegaly: Secondary | ICD-10-CM | POA: Diagnosis not present

## 2014-09-16 DIAGNOSIS — I5022 Chronic systolic (congestive) heart failure: Secondary | ICD-10-CM | POA: Diagnosis not present

## 2014-09-16 DIAGNOSIS — E785 Hyperlipidemia, unspecified: Secondary | ICD-10-CM | POA: Insufficient documentation

## 2014-09-16 DIAGNOSIS — I351 Nonrheumatic aortic (valve) insufficiency: Secondary | ICD-10-CM | POA: Insufficient documentation

## 2014-09-17 ENCOUNTER — Encounter: Payer: Self-pay | Admitting: Cardiology

## 2014-09-23 ENCOUNTER — Encounter (HOSPITAL_COMMUNITY): Payer: Self-pay

## 2014-09-23 ENCOUNTER — Ambulatory Visit (HOSPITAL_COMMUNITY)
Admission: RE | Admit: 2014-09-23 | Discharge: 2014-09-23 | Disposition: A | Discharge status: Home / Self Care | Payer: Medicare Other | Source: Ambulatory Visit | Attending: Internal Medicine | Admitting: Internal Medicine

## 2014-09-23 DIAGNOSIS — I428 Other cardiomyopathies: Secondary | ICD-10-CM

## 2014-09-23 DIAGNOSIS — I5022 Chronic systolic (congestive) heart failure: Secondary | ICD-10-CM

## 2014-09-23 DIAGNOSIS — I429 Cardiomyopathy, unspecified: Secondary | ICD-10-CM | POA: Insufficient documentation

## 2014-09-23 LAB — BASIC METABOLIC PANEL
ANION GAP: 9 (ref 5–15)
BUN: 14 mg/dL (ref 6–20)
CO2: 25 mmol/L (ref 22–32)
Calcium: 9 mg/dL (ref 8.9–10.3)
Chloride: 104 mmol/L (ref 101–111)
Creatinine, Ser: 0.76 mg/dL (ref 0.44–1.00)
GFR calc non Af Amer: >60 mL/min (ref >60)
Glucose, Bld: 78 mg/dL (ref 65–99)
Potassium: 3.8 mmol/L (ref 3.5–5.1)
Sodium: 138 mmol/L (ref 135–145)

## 2014-09-24 ENCOUNTER — Ambulatory Visit (INDEPENDENT_AMBULATORY_CARE_PROVIDER_SITE_OTHER): Payer: Medicare Other | Admitting: *Deleted

## 2014-09-24 VITALS — Temp 98.4°F

## 2014-09-24 DIAGNOSIS — Z23 Encounter for immunization: Secondary | ICD-10-CM

## 2014-09-24 NOTE — Progress Notes (Signed)
Pt is here for flu shot.  Flu shot given and pt tolerated well. Fleeger, Maryjo Rochester

## 2014-09-26 ENCOUNTER — Other Ambulatory Visit: Payer: Self-pay | Admitting: Family Medicine

## 2014-09-28 ENCOUNTER — Encounter: Payer: Self-pay | Admitting: Internal Medicine

## 2014-10-01 ENCOUNTER — Encounter: Payer: Self-pay | Admitting: Cardiology

## 2014-10-02 ENCOUNTER — Emergency Department (INDEPENDENT_AMBULATORY_CARE_PROVIDER_SITE_OTHER): Payer: Medicare Other

## 2014-10-02 ENCOUNTER — Encounter (HOSPITAL_COMMUNITY): Payer: Self-pay | Admitting: Emergency Medicine

## 2014-10-02 ENCOUNTER — Emergency Department (INDEPENDENT_AMBULATORY_CARE_PROVIDER_SITE_OTHER)
Admission: EM | Admit: 2014-10-02 | Discharge: 2014-10-02 | Disposition: A | Discharge status: Home / Self Care | Payer: Medicare Other | Source: Home / Self Care | Attending: Family Medicine | Admitting: Family Medicine

## 2014-10-02 DIAGNOSIS — S40022A Contusion of left upper arm, initial encounter: Secondary | ICD-10-CM

## 2014-10-02 NOTE — ED Provider Notes (Signed)
CSN: [id number]     Arrival date & time 10/02/14  1600 History   First MD Initiated Contact with Patient 10/02/14 1658     Chief Complaint  Patient presents with  . Arm Pain   (Consider location/radiation/quality/duration/timing/severity/associated sxs/prior Treatment) HPI Comments: 64 year old female was getting off a city bus today and during that time the bus door closed while she still had her arm inside the door. The door closed on to the left forearm causing local pain. She states the doors were aligned with a rubber seal that closed on her arm. During the time that it was closing she pulled her arm out quickly.  a few minutes later she noticed swelling to the left forearm primarily to the ulnar aspect. She is complaining of local soreness, tenderness and pain. Denies other injury.    Past Medical History  Diagnosis Date  . GERD (gastroesophageal reflux disease)   . Arthritis   . PONV (postoperative nausea and vomiting)   . Systolic CHF     a. Dx 07/2013 - due to NICM. Cath normal cors. EF 25%.  [name] LBBB (left bundle branch block)   . Hypotension   . Mitral regurgitation     a. Mild-mod MR by echo 07/2013.  [name] Automatic implantable cardioverter-defibrillator in situ   . HLD (hyperlipidemia)   . CHF (congestive heart failure)   . Stroke 09/23/2013  . Cardiac resynchronization therapy defibrillator (CRT-D) in place     a. placed on 11/05/13.   Past Surgical History  Procedure Laterality Date  . Tonsillectomy    . Knee arthroscopy Right   . Wrist arthroscopy  12/21/2010    Procedure: ARTHROSCOPY WRIST;  Surgeon: [name], MD;  Location: [facility];  Service: Orthopedics;  Laterality: Right;  right wrist, repair TFCC on right, open reconstruction ECU sheath  . Tee without cardioversion N/A 09/26/2013    Procedure: TRANSESOPHAGEAL ECHOCARDIOGRAM (TEE);  Surgeon: [name], MD;  Location: [facility] ENDOSCOPY;  Service: Cardiovascular;  Laterality: N/A;  . Bi-ventricular  implantable cardioverter defibrillator  (crt-d)  11/05/2013  . Cardiac catheterization  07/2013  . Left and right heart catheterization with coronary angiogram N/A 08/08/2013    Procedure: LEFT AND RIGHT HEART CATHETERIZATION WITH CORONARY ANGIOGRAM;  Surgeon: [name], MD;  Location: [facility] CATH LAB;  Service: Cardiovascular;  Laterality: N/A;  . Bi-ventricular implantable cardioverter defibrillator N/A 11/05/2013    Procedure: BI-VENTRICULAR IMPLANTABLE CARDIOVERTER DEFIBRILLATOR  (CRT-D);  Surgeon: Duke Salvia, MD;  Location: [facility] CATH LAB;  Service: Cardiovascular;  Laterality: N/A;   Family History  Problem Relation Age of Onset  . Congestive Heart Failure Mother    Social History  Substance Use Topics  . Smoking status: Never Smoker   . Smokeless tobacco: Never Used  . Alcohol Use: No   OB History    No data available     Review of Systems  Constitutional: Negative.   Respiratory: Negative for shortness of breath.   Cardiovascular: Negative for chest pain.  Gastrointestinal: Negative.   Musculoskeletal: Negative for joint swelling.       Left Forearm pain and tenderness has an history of present illness  Skin: Negative.   Neurological: Negative for dizziness and headaches.    Allergies  Onion and Tomato  Home Medications   Prior to Admission medications   Medication Sig Start Date End Date Taking? Authorizing Provider  atorvastatin (LIPITOR) 40 MG tablet Take 1 tablet (40 mg total) by mouth daily at 6  PM. 03/18/14   Dolores Patty, MD  carvedilol (COREG) 6.25 MG tablet TAKE ONE TABLET BY MOUTH TWICE DAILY WITH A MEAL 09/28/14   Myra Rude, MD  clopidogrel (PLAVIX) 75 MG tablet TAKE ONE TABLET BY MOUTH EVERY DAY 09/04/14   Myra Rude, MD  fluticasone [facility]) 50 MCG/ACT nasal spray Place 2 sprays into both nostrils daily as needed for allergies or rhinitis.    Historical Provider, MD  furosemide (LASIX) 40 MG tablet Take 1 tablet (40 mg total) by  mouth daily. 09/11/14   [name], MD  lisinopril (PRINIVIL,ZESTRIL) 5 MG tablet Take 1 tablet (5 mg total) by mouth 2 (two) times daily. 09/11/14   [name], MD   Meds Ordered and Administered this Visit  Medications - No data to display  BP 107/60 mmHg  Pulse 70  Temp(Src) 97 F (36.1 C) (Oral)  Resp 16  SpO2 95% No data found.   Physical Exam  Constitutional: She is oriented to person, place, and time. She appears well-developed and well-nourished. No distress.  Neck: Normal range of motion. Neck supple.  Cardiovascular:  Apical pulse rate is 72 and regular.  Pulmonary/Chest: Effort normal. No respiratory distress.  Musculoskeletal:  The left forearm has mild swelling primarily to the ulnar aspect. There is tenderness to this area. No discoloration. No deformity. Minor tenderness to the wrist. She is able to provide full range of motion to the wrist and digits. Pronation and supination is fully intact. There is no bony tenderness. Percussion tenderness along the longitudinal axis of the forearm produces no pain. Distal neurovascular motor sensory is intact.  Neurological: She is alert and oriented to person, place, and time.  Skin: Skin is warm and dry.  Psychiatric: She has a normal mood and affect.  Nursing note and vitals reviewed.   ED Course  Procedures (including critical care time)  Labs Review Labs Reviewed - No data to display  Imaging Review Dg Forearm Left  10/02/2014   CLINICAL DATA:  64 year old female with trauma to the left forearm.  EXAM: LEFT FOREARM - 2 VIEW  COMPARISON:  None.  FINDINGS: There is no evidence of fracture or other focal bone lesions. Soft tissues are unremarkable.  IMPRESSION: No fracture or dislocation.   Electronically Signed   By: Elgie Collard M.D.   On: 10/02/2014 18:28     Visual Acuity Review  Right Eye Distance:   Left Eye Distance:   Bilateral Distance:    Right Eye Near:   Left Eye Near:    Bilateral Near:          MDM   1. Arm contusion, left, initial encounter    Wear ACE wrap for 2 days Ice for swelling Keep elevated     Hayden Rasmussen, NP 10/02/14 1844

## 2014-10-02 NOTE — Discharge Instructions (Signed)
Contusion Wear ACE wrap for 2 days Ice for swelling Keep elevated A contusion is a deep bruise. Contusions are the result of an injury that caused bleeding under the skin. The contusion may turn blue, purple, or yellow. Minor injuries will give you a painless contusion, but more severe contusions may stay painful and swollen for a few weeks.  CAUSES  A contusion is usually caused by a blow, trauma, or direct force to an area of the body. SYMPTOMS   Swelling and redness of the injured area.  Bruising of the injured area.  Tenderness and soreness of the injured area.  Pain. DIAGNOSIS  The diagnosis can be made by taking a history and physical exam. An X-ray, CT scan, or MRI may be needed to determine if there were any associated injuries, such as fractures. TREATMENT  Specific treatment will depend on what area of the body was injured. In general, the best treatment for a contusion is resting, icing, elevating, and applying cold compresses to the injured area. Over-the-counter medicines may also be recommended for pain control. Ask your caregiver what the best treatment is for your contusion. HOME CARE INSTRUCTIONS   Put ice on the injured area.  Put ice in a plastic bag.  Place a towel between your skin and the bag.  Leave the ice on for 15-20 minutes, 3-4 times a day, or as directed by your health care provider.  Only take over-the-counter or prescription medicines for pain, discomfort, or fever as directed by your caregiver. Your caregiver may recommend avoiding anti-inflammatory medicines (aspirin, ibuprofen, and naproxen) for 48 hours because these medicines may increase bruising.  Rest the injured area.  If possible, elevate the injured area to reduce swelling. SEEK IMMEDIATE MEDICAL CARE IF:   You have increased bruising or swelling.  You have pain that is getting worse.  Your swelling or pain is not relieved with medicines. MAKE SURE YOU:   Understand these  instructions.  Will watch your condition.  Will get help right away if you are not doing well or get worse. Document Released: 10/12/2004 Document Revised: 01/07/2013 Document Reviewed: 11/07/2010 [facility] Patient Information 2015 [place], [place]. This information is not intended to replace advice given to you by your health care provider. Make sure you discuss any questions you have with your health care provider.

## 2014-10-02 NOTE — ED Notes (Signed)
Patient reports she was stepping off a bus, left arm was caught in bus door.  Pain to mid forearm, numbness in fingers.  Able to move fingers, radial pulse 2 +, cap refill brisk

## 2014-10-22 ENCOUNTER — Encounter: Payer: Self-pay | Admitting: Internal Medicine

## 2014-11-09 ENCOUNTER — Ambulatory Visit (INDEPENDENT_AMBULATORY_CARE_PROVIDER_SITE_OTHER): Payer: Medicare Other | Admitting: Family Medicine

## 2014-11-09 ENCOUNTER — Encounter: Payer: Self-pay | Admitting: Family Medicine

## 2014-11-09 VITALS — BP 128/84 | HR 60 | Temp 98.1°F | Ht 65.0 in | Wt 165.0 lb

## 2014-11-09 DIAGNOSIS — I5022 Chronic systolic (congestive) heart failure: Secondary | ICD-10-CM

## 2014-11-09 DIAGNOSIS — J309 Allergic rhinitis, unspecified: Secondary | ICD-10-CM

## 2014-11-09 MED ORDER — CETIRIZINE HCL 10 MG PO CAPS: 10.0000 mg | ORAL_CAPSULE | Freq: Every day | ORAL | Status: DC

## 2014-11-09 NOTE — Progress Notes (Signed)
Subjective:    [name] - 64 y.o. female MRN [id number]  Date of birth: 03-17-1950  HPI  [name] is here for f/ul.   HFREF:  Walking a lot  Feels well with no SOB or CP  She is compliant with medications. Has follow-up with Dr. Graciela Husbands in December or January.  Rhinorrhea, post nasal drip:  Symptoms started happening when the seasons change. She's having some maxillary and frontal sinus pressure. She has some sore throat but has resolved after couple of days. She has used Flonase but symptoms are still occurring. She has a history of allergies and has been allergy tested before. She denies any fevers, shortness of breath, cough, or chills.  Health Maintenance:  - Has completed the stools smear and resulted to be negative  Health Maintenance Due  Topic Date Due  . Hepatitis C Screening  1950-12-19  . HIV Screening  01/03/1964  . TETANUS/TDAP  01/03/1968  . PAP SMEAR  01/02/1970  . ZOSTAVAX  01/02/2009  . MAMMOGRAM  08/14/2009  . PNA vac Low Risk Adult (1 of 2 - PCV13) 09/26/2014    -  reports that she has never smoked. She has never used smokeless tobacco. - Review of Systems: Per HPI. - Past Medical History: Patient Active Problem List   Diagnosis Date Noted  . Allergic rhinitis 11/09/2014  . Cardiac LV ejection fraction 21-40% 12/03/2013  . Cerebral infarction due to embolism of cerebral artery (HCC) 12/03/2013  . Chronic systolic congestive heart failure, NYHA class 2 (HCC) 11/05/2013  . GERD (gastroesophageal reflux disease)   . HLD (hyperlipidemia)   . Chronic hypotension 09/25/2013  . Non-ischemic cardiomyopathy (HCC) 08/11/2013  . Mitral regurgitation   . Acute systolic CHF (congestive heart failure), NYHA class 4 (HCC) 08/08/2013  . Left bundle branch block 08/07/2013  . Preventative health care 04/09/2013   - Medications: reviewed and updated Current Outpatient Prescriptions  Medication Sig Dispense Refill  . atorvastatin (LIPITOR) 40 MG  tablet Take 1 tablet (40 mg total) by mouth daily at 6 PM. 90 tablet 3  . carvedilol (COREG) 6.25 MG tablet TAKE ONE TABLET BY MOUTH TWICE DAILY WITH A MEAL 60 tablet 3  . clopidogrel (PLAVIX) 75 MG tablet TAKE ONE TABLET BY MOUTH EVERY DAY 90 tablet 1  . fluticasone (FLONASE) 50 MCG/ACT nasal spray Place 2 sprays into both nostrils daily as needed for allergies or rhinitis.    . furosemide (LASIX) 40 MG tablet Take 1 tablet (40 mg total) by mouth daily. 30 tablet 6  . lisinopril (PRINIVIL,ZESTRIL) 5 MG tablet Take 1 tablet (5 mg total) by mouth 2 (two) times daily. 60 tablet 6  . Cetirizine HCl 10 MG CAPS Take 1 capsule (10 mg total) by mouth daily. 30 capsule 1   No current facility-administered medications for this visit.     Review of Systems See HPI     Objective:   Physical Exam BP 128/84 mmHg  Pulse 60  Temp(Src) 98.1 F (36.7 C) (Oral)  Ht 5\' 5"  (1.651 m)  Wt 165 lb (74.844 kg)  BMI 27.46 kg/m2 Gen: NAD, alert, cooperative with exam, well-appearing HEENT: NCAT, PERRL, clear conjunctiva, oropharynx clear, supple neck, boggy turbinates  CV: RRR, good S1/S2, no murmur, no edema,  Resp: CTABL, no wheezes, non-labored Neuro: no gross deficits.     Assessment & Plan:   Chronic systolic congestive heart failure, NYHA class 2 Controlled. Will see her cardiologist in December or January  Allergic rhinitis  Symptoms consistent with allergic rhinitis. No suggestion of acute sinusitis or infection. She is currently taking Flonase. - Added Zyrtec to her regimen

## 2014-11-09 NOTE — Patient Instructions (Signed)
Thank you for coming in,   Everything looks good, keep up the good work.   Please bring all of your medications with you to each visit.   Sign up for My Chart to have easy access to your labs results, and communication with your Primary care physician   Please feel free to call with any questions or concerns at any time, at [phone]. --Dr. Jordan Likes

## 2014-11-09 NOTE — Assessment & Plan Note (Signed)
Controlled. Will see her cardiologist in December or January

## 2014-11-09 NOTE — Assessment & Plan Note (Signed)
Symptoms consistent with allergic rhinitis. No suggestion of acute sinusitis or infection. She is currently taking Flonase. - Added Zyrtec to her regimen

## 2014-11-10 ENCOUNTER — Telehealth: Payer: Self-pay | Admitting: *Deleted

## 2014-11-10 NOTE — Telephone Encounter (Signed)
Prior Authorization received from [facility] for Cetirizine. Pa completed online at [email or web address]. PA process could take 24-72 hours to complete.   Clovis Pu, RN

## 2014-11-11 NOTE — Telephone Encounter (Signed)
PA denied under Medicare Part D.  Drug is not covered under plan.  Patient has to purchase medication OTC.  Reference number: [id number]. Clovis Pu, RN

## 2014-11-23 ENCOUNTER — Encounter: Payer: Self-pay | Admitting: Internal Medicine

## 2014-11-23 ENCOUNTER — Ambulatory Visit (INDEPENDENT_AMBULATORY_CARE_PROVIDER_SITE_OTHER): Payer: Medicare Other | Admitting: Internal Medicine

## 2014-11-23 VITALS — BP 108/96 | HR 60 | Ht 65.0 in | Wt 165.8 lb

## 2014-11-23 DIAGNOSIS — I5022 Chronic systolic (congestive) heart failure: Secondary | ICD-10-CM | POA: Diagnosis not present

## 2014-11-23 DIAGNOSIS — I5021 Acute systolic (congestive) heart failure: Secondary | ICD-10-CM

## 2014-11-23 DIAGNOSIS — I428 Other cardiomyopathies: Secondary | ICD-10-CM

## 2014-11-23 DIAGNOSIS — I429 Cardiomyopathy, unspecified: Secondary | ICD-10-CM | POA: Diagnosis not present

## 2014-11-23 LAB — CUP PACEART INCLINIC DEVICE CHECK
Battery Remaining Longevity: 86 mo
Brady Statistic AP VS Percent: 0.4 %
Brady Statistic AS VP Percent: 75.84 %
Brady Statistic AS VS Percent: 1.09 %
Brady Statistic RA Percent Paced: 23.07 %
Brady Statistic RV Percent Paced: 1.55 %
HighPow Impedance: 80 Ohm
Implantable Lead Implant Date: 20151021
Implantable Lead Implant Date: 20151021
Implantable Lead Implant Date: 20151021
Implantable Lead Location: 753858
Implantable Lead Location: 753860
Implantable Lead Model: 5076
Lead Channel Impedance Value: 456 Ohm
Lead Channel Impedance Value: 513 Ohm
Lead Channel Impedance Value: 513 Ohm
Lead Channel Impedance Value: 665 Ohm
Lead Channel Impedance Value: 703 Ohm
Lead Channel Impedance Value: 874 Ohm
Lead Channel Impedance Value: 950 Ohm
Lead Channel Impedance Value: 988 Ohm
Lead Channel Pacing Threshold Amplitude: 0.25 V
Lead Channel Pacing Threshold Amplitude: 2 V
Lead Channel Pacing Threshold Pulse Width: 1 ms
Lead Channel Sensing Intrinsic Amplitude: 17.125 mV
Lead Channel Sensing Intrinsic Amplitude: 4.125 mV
Lead Channel Setting Pacing Pulse Width: 0.4 ms
Lead Channel Setting Pacing Pulse Width: 1 ms
MDC IDC LEAD LOCATION: 753859
MDC IDC LEAD MODEL: 4298
MDC IDC MSMT BATTERY VOLTAGE: 3 V
MDC IDC MSMT LEADCHNL LV IMPEDANCE VALUE: 1007 Ohm
MDC IDC MSMT LEADCHNL LV IMPEDANCE VALUE: 1007 Ohm
MDC IDC MSMT LEADCHNL LV IMPEDANCE VALUE: 646 Ohm
MDC IDC MSMT LEADCHNL LV IMPEDANCE VALUE: 722 Ohm
MDC IDC MSMT LEADCHNL RA PACING THRESHOLD PULSEWIDTH: 0.4 ms
MDC IDC MSMT LEADCHNL RV IMPEDANCE VALUE: 551 Ohm
MDC IDC MSMT LEADCHNL RV PACING THRESHOLD AMPLITUDE: 0.75 V
MDC IDC MSMT LEADCHNL RV PACING THRESHOLD PULSEWIDTH: 0.4 ms
MDC IDC SESS DTM: 20161107173701
MDC IDC SET LEADCHNL LV PACING AMPLITUDE: 2.5 V
MDC IDC SET LEADCHNL RA PACING AMPLITUDE: 1.5 V
MDC IDC SET LEADCHNL RV PACING AMPLITUDE: 2 V
MDC IDC SET LEADCHNL RV SENSING SENSITIVITY: 0.3 mV
MDC IDC STAT BRADY AP VP PERCENT: 22.67 %

## 2014-11-23 NOTE — Progress Notes (Signed)
Patient Care Team: Myra Rude, MD as PCP - General (Family Medicine)   HPI  [name] is a 64 y.o. female Seen following hospital consultation regarding loop recorder placement for cryptogenic stroke. It was noted at that time that she had presented with acute heart failure in July 2015. and was found to have an ejection fraction 20-25%. Catheterization demonstrated no coronary disease. She is also noted to have left bundle branch block. Medication up titration was limited by hypotension. It was felt that CRT would be appropriate if her LV function remains depressed.  She underwent CRT implant 10/15  She is much improved     Past Medical History  Diagnosis Date  . GERD (gastroesophageal reflux disease)   . Arthritis   . PONV (postoperative nausea and vomiting)   . Systolic CHF (HCC)     a. Dx 07/2013 - due to NICM. Cath normal cors. EF 25%.  [name] LBBB (left bundle branch block)   . Hypotension   . Mitral regurgitation     a. Mild-mod MR by echo 07/2013.  [name] Automatic implantable cardioverter-defibrillator in situ   . HLD (hyperlipidemia)   . CHF (congestive heart failure) (HCC)   . Stroke (HCC) 09/23/2013  . Cardiac resynchronization therapy defibrillator (CRT-D) in place     a. placed on 11/05/13.    Past Surgical History  Procedure Laterality Date  . Tonsillectomy    . Knee arthroscopy Right   . Wrist arthroscopy  12/21/2010    Procedure: ARTHROSCOPY WRIST;  Surgeon: [name], MD;  Location: [facility];  Service: Orthopedics;  Laterality: Right;  right wrist, repair TFCC on right, open reconstruction ECU sheath  . Tee without cardioversion N/A 09/26/2013    Procedure: TRANSESOPHAGEAL ECHOCARDIOGRAM (TEE);  Surgeon: [name], MD;  Location[facility] ENDOSCOPY;  Service: Cardiovascular;  Laterality: N/A;  . Bi-ventricular implantable cardioverter defibrillator  (crt-d)  11/05/2013  . Cardiac catheterization  07/2013  . Left and right heart  catheterization with coronary angiogram N/A 08/08/2013    Procedure: LEFT AND RIGHT HEART CATHETERIZATION WITH CORONARY ANGIOGRAM;  Surgeon: [name], MD;  Location: [facility] CATH LAB;  Service: Cardiovascular;  Laterality: N/A;  . Bi-ventricular implantable cardioverter defibrillator N/A 11/05/2013    Procedure: BI-VENTRICULAR IMPLANTABLE CARDIOVERTER DEFIBRILLATOR  (CRT-D);  Surgeon: Duke Salvia, MD;  Location: [facility] CATH LAB;  Service: Cardiovascular;  Laterality: N/A;    Current Outpatient Prescriptions  Medication Sig Dispense Refill  . atorvastatin (LIPITOR) 40 MG tablet Take 1 tablet (40 mg total) by mouth daily at 6 PM. 90 tablet 3  . carvedilol (COREG) 6.25 MG tablet TAKE ONE TABLET BY MOUTH TWICE DAILY WITH A MEAL 60 tablet 3  . Cetirizine HCl 10 MG CAPS Take 1 capsule (10 mg total) by mouth daily. 30 capsule 1  . clopidogrel (PLAVIX) 75 MG tablet TAKE ONE TABLET BY MOUTH EVERY DAY 90 tablet 1  . fluticasone (FLONASE) 50 MCG/ACT nasal spray Place 2 sprays into both nostrils daily as needed for allergies or rhinitis.    . furosemide (LASIX) 40 MG tablet Take 1 tablet (40 mg total) by mouth daily. 30 tablet 6  . lisinopril (PRINIVIL,ZESTRIL) 5 MG tablet Take 1 tablet (5 mg total) by mouth 2 (two) times daily. 60 tablet 6   No current facility-administered medications for this visit.    Allergies  Allergen Reactions  . Onion Other (See Comments)    gas  . Tomato Other (  See Comments)    reflux    Review of Systems negative except from HPI and PMH  Physical Exam BP 108/96 mmHg  Pulse 60  Ht  (1.651 m)  Wt 165 lb 12.8 oz (75.206 kg)  BMI 27.59 kg/m2 Well developed and well nourished in no acute distress HENT normal E scleral and icterus clear Neck Supple JVP flat; carotids brisk and full Clear to ausculation  Regular rate and rhythm, no murmurs gallops or rub Soft with active bowel sounds No clubbing cyanosis  Edema Alert and oriented, mild residual  weakness Skin Warm and Dry  ECG demonstrates sinus rhythm at 60 with intervals 13/13/43 Left bundle branch block  Assessment and  Plan   Nonischemic cardio myopathy  Congestive heart failure-chronic systolic  Left bundle branch block  Prior stroke with residual weakness   Pt now with normal/narrow QRS  So with High LV thereshold will reprogram AV delay to minimize Ventricular pacing  She is weuvolemic and currently class 2  Continue current meds

## 2014-11-23 NOTE — Patient Instructions (Signed)
Medication Instructions: - no changes  Labwork: - none  Procedures/Testing: - none  Follow-Up: - Remote monitoring is used to monitor your Pacemaker of ICD from home. This monitoring reduces the number of office visits required to check your device to one time per year. It allows us to keep an eye on the functioning of your device to ensure it is working properly. You are scheduled for a device check from home on 02/22/15. You may send your transmission at any time that day. If you have a wireless device, the transmission will be sent automatically. After your physician reviews your transmission, you will receive a postcard with your next transmission date.  - Your physician wants you to follow-up in: 1 year with Dr. Klein. You will receive a reminder letter in the mail two months in advance. If you don't receive a letter, please call our office to schedule the follow-up appointment.  Any Additional Special Instructions Will Be Listed Below (If Applicable).   

## 2014-12-08 ENCOUNTER — Other Ambulatory Visit (HOSPITAL_COMMUNITY): Payer: Self-pay | Admitting: *Deleted

## 2014-12-08 MED ORDER — LISINOPRIL 5 MG PO TABS: 5.0000 mg | ORAL_TABLET | Freq: Two times a day (BID) | ORAL | Status: DC

## 2015-02-11 ENCOUNTER — Encounter: Payer: Self-pay | Admitting: Family Medicine

## 2015-02-11 ENCOUNTER — Ambulatory Visit (INDEPENDENT_AMBULATORY_CARE_PROVIDER_SITE_OTHER): Payer: Commercial Managed Care - HMO | Admitting: Family Medicine

## 2015-02-11 VITALS — BP 105/67 | HR 71 | Temp 97.9°F | Ht 65.0 in | Wt 166.4 lb

## 2015-02-11 DIAGNOSIS — I5042 Chronic combined systolic (congestive) and diastolic (congestive) heart failure: Secondary | ICD-10-CM

## 2015-02-11 DIAGNOSIS — Z1159 Encounter for screening for other viral diseases: Secondary | ICD-10-CM | POA: Diagnosis not present

## 2015-02-11 DIAGNOSIS — M25562 Pain in left knee: Secondary | ICD-10-CM

## 2015-02-11 DIAGNOSIS — I5022 Chronic systolic (congestive) heart failure: Secondary | ICD-10-CM

## 2015-02-11 DIAGNOSIS — I509 Heart failure, unspecified: Secondary | ICD-10-CM | POA: Diagnosis not present

## 2015-02-11 DIAGNOSIS — K219 Gastro-esophageal reflux disease without esophagitis: Secondary | ICD-10-CM

## 2015-02-11 LAB — CBC
HCT: 37.4 % (ref 36.0–46.0)
HEMOGLOBIN: 12.5 g/dL (ref 12.0–15.0)
MCH: 29.8 pg (ref 26.0–34.0)
MCHC: 33.4 g/dL (ref 30.0–36.0)
MCV: 89 fL (ref 78.0–100.0)
MPV: 9.6 fL (ref 8.6–12.4)
Platelets: 225 10*3/uL (ref 150–400)
RBC: 4.2 MIL/uL (ref 3.87–5.11)
RDW: 13.1 % (ref 11.5–15.5)
WBC: 8.1 10*3/uL (ref 4.0–10.5)

## 2015-02-11 MED ORDER — RANITIDINE HCL 150 MG PO CAPS
150.0000 mg | ORAL_CAPSULE | Freq: Two times a day (BID) | ORAL | Status: DC
Start: 2015-02-11 — End: 2015-03-06

## 2015-02-11 NOTE — Patient Instructions (Addendum)
Thank you for coming in,   I will call with the lab results.    I have referred due to gastroenterology for your  Reflux. I have started Zantac and let me know how this does  Please bring all of your medications with you to each visit.   Sign up for My Chart to have easy access to your labs results, and communication with your Primary care physician   Please feel free to call with any questions or concerns at any time, at [phone]. --Dr. Jordan Likes Gastroesophageal Reflux Disease, Adult Normally, food travels down the esophagus and stays in the stomach to be digested. However, when a person has gastroesophageal reflux disease (GERD), food and stomach acid move back up into the esophagus. When this happens, the esophagus becomes sore and inflamed. Over time, GERD can create small holes (ulcers) in the lining of the esophagus.  CAUSES This condition is caused by a problem with the muscle between the esophagus and the stomach (lower esophageal sphincter, or LES). Normally, the LES muscle closes after food passes through the esophagus to the stomach. When the LES is weakened or abnormal, it does not close properly, and that allows food and stomach acid to go back up into the esophagus. The LES can be weakened by certain dietary substances, medicines, and medical conditions, including:  Tobacco use.  Pregnancy.  Having a hiatal hernia.  Heavy alcohol use.  Certain foods and beverages, such as coffee, chocolate, onions, and peppermint. RISK FACTORS This condition is more likely to develop in:  People who have an increased body weight.  People who have connective tissue disorders.  People who use NSAID medicines. SYMPTOMS Symptoms of this condition include:  Heartburn.  Difficult or painful swallowing.  The feeling of having a lump in the throat.  Abitter taste in the mouth.  Bad breath.  Having a large amount of saliva.  Having an upset or bloated  stomach.  Belching.  Chest pain.  Shortness of breath or wheezing.  Ongoing (chronic) cough or a night-time cough.  Wearing away of tooth enamel.  Weight loss. Different conditions can cause chest pain. Make sure to see your health care provider if you experience chest pain. DIAGNOSIS Your health care provider will take a medical history and perform a physical exam. To determine if you have mild or severe GERD, your health care provider may also monitor how you respond to treatment. You may also have other tests, including:  An endoscopy toexamine your stomach and esophagus with a small camera.  A test thatmeasures the acidity level in your esophagus.  A test thatmeasures how much pressure is on your esophagus.  A barium swallow or modified barium swallow to show the shape, size, and functioning of your esophagus. TREATMENT The goal of treatment is to help relieve your symptoms and to prevent complications. Treatment for this condition may vary depending on how severe your symptoms are. Your health care provider may recommend:  Changes to your diet.  Medicine.  Surgery. HOME CARE INSTRUCTIONS Diet  Follow a diet as recommended by your health care provider. This may involve avoiding foods and drinks such as:  Coffee and tea (with or without caffeine).  Drinks that containalcohol.  Energy drinks and sports drinks.  Carbonated drinks or sodas.  Chocolate and cocoa.  Peppermint and mint flavorings.  Garlic and onions.  Horseradish.  Spicy and acidic foods, including peppers, chili powder, curry powder, vinegar, hot sauces, and barbecue sauce.  Citrus fruit juices and  citrus fruits, such as oranges, lemons, and limes.  Tomato-based foods, such as red sauce, chili, salsa, and pizza with red sauce.  Fried and fatty foods, such as donuts, french fries, potato chips, and high-fat dressings.  High-fat meats, such as hot dogs and fatty cuts of red and white  meats, such as rib eye steak, sausage, ham, and bacon.  High-fat dairy items, such as whole milk, butter, and cream cheese.  Eat small, frequent meals instead of large meals.  Avoid drinking large amounts of liquid with your meals.  Avoid eating meals during the 2-3 hours before bedtime.  Avoid lying down right after you eat.  Do not exercise right after you eat. General Instructions  Pay attention to any changes in your symptoms.  Take over-the-counter and prescription medicines only as told by your health care provider. Do not take aspirin, ibuprofen, or other NSAIDs unless your health care provider told you to do so.  Do not use any tobacco products, including cigarettes, chewing tobacco, and e-cigarettes. If you need help quitting, ask your health care provider.  Wear loose-fitting clothing. Do not wear anything tight around your waist that causes pressure on your abdomen.  Raise (elevate) the head of your bed 6 inches (15cm).  Try to reduce your stress, such as with yoga or meditation. If you need help reducing stress, ask your health care provider.  If you are overweight, reduce your weight to an amount that is healthy for you. Ask your health care provider for guidance about a safe weight loss goal.  Keep all follow-up visits as told by your health care provider. This is important. SEEK MEDICAL CARE IF:  You have new symptoms.  You have unexplained weight loss.  You have difficulty swallowing, or it hurts to swallow.  You have wheezing or a persistent cough.  Your symptoms do not improve with treatment.  You have a hoarse voice. SEEK IMMEDIATE MEDICAL CARE IF:  You have pain in your arms, neck, jaw, teeth, or back.  You feel sweaty, dizzy, or light-headed.  You have chest pain or shortness of breath.  You vomit and your vomit looks like blood or coffee grounds.  You faint.  Your stool is bloody or black.  You cannot swallow, drink, or eat.   This  information is not intended to replace advice given to you by your health care provider. Make sure you discuss any questions you have with your health care provider.   Document Released: 10/12/2004 Document Revised: 09/23/2014 Document Reviewed: 04/29/2014 Elsevier Interactive Patient Education [employer]

## 2015-02-11 NOTE — Progress Notes (Signed)
Subjective:    [name] - 65 y.o. female MRN [id number]  Date of birth: January 05, 1951  HPI  [name] is here for fall and chronic conditions.  Fall/left knee pain:  She tripped over her pants about 2 weeks ago.  She landed on her left knee.  Having pain with this and bruising.  She is on plavix.  It is worse with rising with a seated position.  Has tried ice.   GERD:  Has been worse since her CHF started.  Symptoms worse after eating with tomato sauce.  She has tried OTC zantac and prilosec and this has improved her symptoms.  Feels like her symptoms would be better if she could belch.  Denies any SOB or chest pain.  No anemia.  Performed the home FOBT and was normal.   HFrEF:  Has had improvement of her EF to 35% Has an ICD placed and it hasn't fired  Asymptomatic today.     Health Maintenance:  - hep c screening.  Health Maintenance Due  Topic Date Due  . TETANUS/TDAP  01/03/1968  . ZOSTAVAX  01/02/2009  . MAMMOGRAM  08/14/2009  . PNA vac Low Risk Adult (1 of 2 - PCV13) 09/26/2014    -  reports that she has never smoked. She has never used smokeless tobacco. - Review of Systems: Per HPI. - Past Medical History: Patient Active Problem List   Diagnosis Date Noted  . Left knee pain 02/15/2015  . Allergic rhinitis 11/09/2014  . Cardiac LV ejection fraction 21-40% 12/03/2013  . Cerebral infarction due to embolism of cerebral artery (HCC) 12/03/2013  . Chronic systolic congestive heart failure, NYHA class 2 (HCC) 11/05/2013  . GERD (gastroesophageal reflux disease)   . HLD (hyperlipidemia)   . Chronic hypotension 09/25/2013  . Non-ischemic cardiomyopathy (HCC) 08/11/2013  . Mitral regurgitation   . Acute systolic CHF (congestive heart failure), NYHA class 4 (HCC) 08/08/2013  . Left bundle branch block 08/07/2013  . Preventative health care 04/09/2013   - Medications: reviewed and updated Current Outpatient Prescriptions  Medication Sig Dispense  Refill  . atorvastatin (LIPITOR) 40 MG tablet Take 1 tablet (40 mg total) by mouth daily at 6 PM. 90 tablet 3  . carvedilol (COREG) 6.25 MG tablet TAKE ONE TABLET BY MOUTH TWICE DAILY WITH A MEAL 60 tablet 3  . Cetirizine HCl 10 MG CAPS Take 1 capsule (10 mg total) by mouth daily. 30 capsule 1  . clopidogrel (PLAVIX) 75 MG tablet TAKE ONE TABLET BY MOUTH EVERY DAY 90 tablet 1  . fluticasone (FLONASE) 50 MCG/ACT nasal spray Place 2 sprays into both nostrils daily as needed for allergies or rhinitis.    . furosemide (LASIX) 40 MG tablet Take 1 tablet (40 mg total) by mouth daily. 30 tablet 6  . lisinopril (PRINIVIL,ZESTRIL) 5 MG tablet Take 1 tablet (5 mg total) by mouth 2 (two) times daily. 60 tablet 6  . ranitidine (ZANTAC) 150 MG capsule Take 1 capsule (150 mg total) by mouth 2 (two) times daily. 60 capsule 1   No current facility-administered medications for this visit.     Review of Systems See HPI     Objective:   Physical Exam BP 105/67 mmHg  Pulse 71  Temp(Src) 97.9 F (36.6 C) (Oral)  Ht  (1.651 m)  Wt 166 lb 6.4 oz (75.479 kg)  BMI 27.69 kg/m2 Gen: NAD, alert, cooperative with exam,  CV: RRR, good S1/S2, no murmur,  Resp: CTABL, no  wheezes, non-labored MSK: no ecchymosis, mildly swollen left knee,  tender to palpation on left lateral knee and proximal fibula, 5/5 strength in LE b/l, normal sensation and pulses intact  Skin: no rashes, normal turgor  Neuro: no gross deficits.  Psych: good insight, alert and oriented    Assessment & Plan:   Chronic systolic congestive heart failure, NYHA class 2 Has had an increase in her EF upon last ECHO.  Defibrillator placed and has no firings.   GERD (gastroesophageal reflux disease) Reports that she has been diagnosed in the '90's.  No weight loss, normal bm and no anemia.  Sounds like reflux  - start zantac. Avoiding PPI as they may interact with her plavix.  - referral to GI. Consider EGD   Left knee pain Occurring  after her trauma of falling onto her knee.  May have a small hematoma developed.  She is on plavix so will watch her platelets.  She reports it appears to be resolving.  - CBC

## 2015-02-12 LAB — HEPATITIS C ANTIBODY: HCV Ab: NEGATIVE

## 2015-02-15 ENCOUNTER — Other Ambulatory Visit: Payer: Self-pay | Admitting: Family Medicine

## 2015-02-15 DIAGNOSIS — M25562 Pain in left knee: Secondary | ICD-10-CM | POA: Insufficient documentation

## 2015-02-15 NOTE — Assessment & Plan Note (Signed)
Reports that she has been diagnosed in the '90's.  No weight loss, normal bm and no anemia.  Sounds like reflux  - start zantac. Avoiding PPI as they may interact with her plavix.  - referral to GI. Consider EGD

## 2015-02-15 NOTE — Assessment & Plan Note (Signed)
Occurring after her trauma of falling onto her knee.  May have a small hematoma developed.  She is on plavix so will watch her platelets.  She reports it appears to be resolving.  - CBC

## 2015-02-15 NOTE — Assessment & Plan Note (Signed)
Has had an increase in her EF upon last ECHO.  Defibrillator placed and has no firings.

## 2015-02-16 ENCOUNTER — Other Ambulatory Visit (HOSPITAL_COMMUNITY): Payer: Self-pay | Admitting: *Deleted

## 2015-02-16 MED ORDER — FUROSEMIDE 40 MG PO TABS: 40.0000 mg | ORAL_TABLET | Freq: Every day | ORAL | Status: DC

## 2015-02-22 ENCOUNTER — Ambulatory Visit (INDEPENDENT_AMBULATORY_CARE_PROVIDER_SITE_OTHER): Payer: Commercial Managed Care - HMO | Admitting: *Deleted

## 2015-02-22 DIAGNOSIS — I5022 Chronic systolic (congestive) heart failure: Secondary | ICD-10-CM | POA: Diagnosis not present

## 2015-02-22 DIAGNOSIS — I429 Cardiomyopathy, unspecified: Secondary | ICD-10-CM | POA: Diagnosis not present

## 2015-02-22 DIAGNOSIS — I428 Other cardiomyopathies: Secondary | ICD-10-CM

## 2015-02-23 NOTE — Progress Notes (Signed)
Remote ICD transmission.   

## 2015-02-26 ENCOUNTER — Telehealth: Payer: Self-pay | Admitting: Family Medicine

## 2015-02-26 NOTE — Telephone Encounter (Signed)
Pt called and needs refills on her Coreg, Lasix, and Lipitor called in. jw

## 2015-03-01 MED ORDER — FUROSEMIDE 40 MG PO TABS: 40.0000 mg | ORAL_TABLET | Freq: Every day | ORAL | Status: DC

## 2015-03-01 MED ORDER — CARVEDILOL 6.25 MG PO TABS: 6.2500 mg | ORAL_TABLET | Freq: Two times a day (BID) | ORAL | Status: DC

## 2015-03-01 MED ORDER — ATORVASTATIN CALCIUM 40 MG PO TABS: 40.0000 mg | ORAL_TABLET | Freq: Every day | ORAL | Status: DC

## 2015-03-06 ENCOUNTER — Other Ambulatory Visit: Payer: Self-pay | Admitting: Family Medicine

## 2015-03-11 ENCOUNTER — Other Ambulatory Visit: Payer: Self-pay | Admitting: Family Medicine

## 2015-03-20 LAB — CUP PACEART REMOTE DEVICE CHECK
Battery Remaining Longevity: 78 mo
Battery Voltage: 2.98 V
Brady Statistic AP VS Percent: 0.86 %
Brady Statistic AS VS Percent: 13.16 %
Date Time Interrogation Session: 20170206134110
HighPow Impedance: 90 Ohm
Implantable Lead Implant Date: 20151021
Implantable Lead Implant Date: 20151021
Implantable Lead Location: 753859
Implantable Lead Model: 4298
Implantable Lead Model: 5076
Lead Channel Impedance Value: 399 Ohm
Lead Channel Impedance Value: 418 Ohm
Lead Channel Impedance Value: 418 Ohm
Lead Channel Impedance Value: 532 Ohm
Lead Channel Impedance Value: 589 Ohm
Lead Channel Impedance Value: 703 Ohm
Lead Channel Impedance Value: 703 Ohm
Lead Channel Impedance Value: 722 Ohm
Lead Channel Pacing Threshold Amplitude: 1.125 V
Lead Channel Pacing Threshold Pulse Width: 0.4 ms
Lead Channel Sensing Intrinsic Amplitude: 11.5 mV
Lead Channel Sensing Intrinsic Amplitude: 3 mV
Lead Channel Setting Pacing Amplitude: 1.5 V
Lead Channel Setting Pacing Amplitude: 2 V
Lead Channel Setting Pacing Amplitude: 2.5 V
Lead Channel Setting Pacing Pulse Width: 0.4 ms
Lead Channel Setting Pacing Pulse Width: 1 ms
MDC IDC LEAD IMPLANT DT: 20151021
MDC IDC LEAD LOCATION: 753858
MDC IDC LEAD LOCATION: 753860
MDC IDC MSMT LEADCHNL LV IMPEDANCE VALUE: 456 Ohm
MDC IDC MSMT LEADCHNL LV IMPEDANCE VALUE: 475 Ohm
MDC IDC MSMT LEADCHNL LV IMPEDANCE VALUE: 722 Ohm
MDC IDC MSMT LEADCHNL LV IMPEDANCE VALUE: 779 Ohm
MDC IDC MSMT LEADCHNL LV PACING THRESHOLD PULSEWIDTH: 0.5 ms
MDC IDC MSMT LEADCHNL RA IMPEDANCE VALUE: 532 Ohm
MDC IDC MSMT LEADCHNL RA PACING THRESHOLD AMPLITUDE: 0.375 V
MDC IDC MSMT LEADCHNL RA SENSING INTR AMPL: 3 mV
MDC IDC MSMT LEADCHNL RV PACING THRESHOLD AMPLITUDE: 0.625 V
MDC IDC MSMT LEADCHNL RV PACING THRESHOLD PULSEWIDTH: 0.4 ms
MDC IDC MSMT LEADCHNL RV SENSING INTR AMPL: 11.5 mV
MDC IDC SET LEADCHNL RV SENSING SENSITIVITY: 0.3 mV
MDC IDC STAT BRADY AP VP PERCENT: 19.05 %
MDC IDC STAT BRADY AS VP PERCENT: 66.93 %
MDC IDC STAT BRADY RA PERCENT PACED: 19.91 %
MDC IDC STAT BRADY RV PERCENT PACED: 85.81 %

## 2015-03-20 NOTE — Progress Notes (Signed)
Abnormal remote reviewed. Pt's intrinsic conduction shorter than programmed AV delays - will have her come to device clinic to reprogram   Next [facility] 05/24/15

## 2015-03-21 ENCOUNTER — Encounter: Payer: Self-pay | Admitting: Cardiology

## 2015-03-22 ENCOUNTER — Telehealth: Payer: Self-pay | Admitting: Internal Medicine

## 2015-03-22 NOTE — Telephone Encounter (Signed)
New Message:  Pt is returning Barbara's call from the device clinic in regards to an abnormal transmission that was sent. Please f/u

## 2015-03-22 NOTE — Telephone Encounter (Signed)
Follow up      Calling to see if we received her remote transmission

## 2015-03-22 NOTE — Telephone Encounter (Signed)
Informed patient that remote was received and asked patient if she was having any problems. Patient stated that she was just concerned because she's getting rid of her wifi soon. I explained to her that her remotes are sent through the cell towers and not by wifi.  I encouraged patient to call if she has any other concerns.  Patient voiced understanding.

## 2015-03-24 ENCOUNTER — Telehealth: Payer: Self-pay | Admitting: Internal Medicine

## 2015-03-24 NOTE — Telephone Encounter (Signed)
Informed pt that her home monitor is not dependent on a land line phone. That her home monitor works off cell phone towers and that unfortunately we can not help w/ her [employer] cable bill. Pt verbalized understanding.

## 2015-03-24 NOTE — Telephone Encounter (Signed)
NEW MESSAGE   Pt calling to get assistance with pay on her bill 180.00 or [employer] will disconnect services  And pt will not be able to transmit her device or have telephone or cable

## 2015-04-07 ENCOUNTER — Encounter: Payer: Self-pay | Admitting: Cardiology

## 2015-04-19 ENCOUNTER — Ambulatory Visit (INDEPENDENT_AMBULATORY_CARE_PROVIDER_SITE_OTHER): Payer: Commercial Managed Care - HMO | Admitting: *Deleted

## 2015-04-19 ENCOUNTER — Encounter: Payer: Self-pay | Admitting: Internal Medicine

## 2015-04-19 DIAGNOSIS — I5022 Chronic systolic (congestive) heart failure: Secondary | ICD-10-CM

## 2015-04-19 DIAGNOSIS — Z95810 Presence of automatic (implantable) cardiac defibrillator: Secondary | ICD-10-CM

## 2015-04-19 LAB — CUP PACEART INCLINIC DEVICE CHECK
Battery Remaining Longevity: 80 mo
Brady Statistic AP VS Percent: 1.03 %
Brady Statistic AS VS Percent: 16.8 %
HighPow Impedance: 89 Ohm
Implantable Lead Implant Date: 20151021
Implantable Lead Implant Date: 20151021
Implantable Lead Implant Date: 20151021
Implantable Lead Location: 753858
Implantable Lead Location: 753860
Implantable Lead Model: 4298
Lead Channel Impedance Value: 1007 Ohm
Lead Channel Impedance Value: 513 Ohm
Lead Channel Impedance Value: 665 Ohm
Lead Channel Impedance Value: 722 Ohm
Lead Channel Impedance Value: 893 Ohm
Lead Channel Impedance Value: 988 Ohm
Lead Channel Pacing Threshold Amplitude: 0.5 V
Lead Channel Pacing Threshold Amplitude: 2.5 V
Lead Channel Pacing Threshold Pulse Width: 0.4 ms
Lead Channel Pacing Threshold Pulse Width: 1 ms
Lead Channel Sensing Intrinsic Amplitude: 14.125 mV
Lead Channel Sensing Intrinsic Amplitude: 4.375 mV
Lead Channel Setting Pacing Amplitude: 2 V
Lead Channel Setting Pacing Pulse Width: 0.4 ms
Lead Channel Setting Pacing Pulse Width: 1 ms
Lead Channel Setting Sensing Sensitivity: 0.3 mV
MDC IDC LEAD LOCATION: 753859
MDC IDC MSMT BATTERY VOLTAGE: 2.93 V
MDC IDC MSMT LEADCHNL LV IMPEDANCE VALUE: 532 Ohm
MDC IDC MSMT LEADCHNL LV IMPEDANCE VALUE: 608 Ohm
MDC IDC MSMT LEADCHNL LV IMPEDANCE VALUE: 646 Ohm
MDC IDC MSMT LEADCHNL LV IMPEDANCE VALUE: 950 Ohm
MDC IDC MSMT LEADCHNL LV IMPEDANCE VALUE: 988 Ohm
MDC IDC MSMT LEADCHNL RV IMPEDANCE VALUE: 475 Ohm
MDC IDC MSMT LEADCHNL RV IMPEDANCE VALUE: 589 Ohm
MDC IDC MSMT LEADCHNL RV PACING THRESHOLD AMPLITUDE: 0.75 V
MDC IDC MSMT LEADCHNL RV PACING THRESHOLD PULSEWIDTH: 0.4 ms
MDC IDC SESS DTM: 20170403150106
MDC IDC SET LEADCHNL LV PACING AMPLITUDE: 2.5 V
MDC IDC SET LEADCHNL RA PACING AMPLITUDE: 1.5 V
MDC IDC STAT BRADY AP VP PERCENT: 19.68 %
MDC IDC STAT BRADY AS VP PERCENT: 62.49 %
MDC IDC STAT BRADY RA PERCENT PACED: 20.71 %
MDC IDC STAT BRADY RV PERCENT PACED: 81.96 %

## 2015-04-19 NOTE — Progress Notes (Signed)
CRT-D device check in clinic to reprogram AV delays per AS. Thresholds and sensing consistent with previous device measurements. Lead impedance trends stable over time. No mode switch episodes recorded. No ventricular arrhythmia episodes recorded.   Patient bi-ventricularly pacing 82.0% of the time due to previously extended PAV/SAV. Patient reports that she generally feels "good". Diaphragmatic stim present in all LV vectors based on Vector Express and manual testing, ultimately maintained current programming per SK's 11/2014 note due to chronically elevated LV threshold and diaphragmatic stim. Attempted to reprogram LV output to 3.0V, but patient reports worsened stim--maintained output at 2.5V @ 1.71ms despite minimal safety margin. Will review with SK.  RA and RV outputs with appropriate safety margins. Heart failure diagnostics reviewed and trends are stable for patient. Audible alerts demonstrated for patient. No changes ultimately made this session. Estimated longevity 6.6 years.  Patient enrolled in remote follow up. Patient education completed including shock plan. Carelink on 07/19/15 and ROV with SK in 11/2015.

## 2015-04-19 NOTE — Patient Instructions (Signed)
For billing questions or statements, please call our billing office at [phone].

## 2015-05-04 ENCOUNTER — Other Ambulatory Visit (HOSPITAL_COMMUNITY): Payer: Self-pay | Admitting: *Deleted

## 2015-05-04 MED ORDER — LISINOPRIL 5 MG PO TABS: 5.0000 mg | ORAL_TABLET | Freq: Two times a day (BID) | ORAL | Status: DC

## 2015-05-29 ENCOUNTER — Other Ambulatory Visit: Payer: Self-pay | Admitting: Family Medicine

## 2015-07-05 ENCOUNTER — Other Ambulatory Visit: Payer: Self-pay | Admitting: Family Medicine

## 2015-07-19 ENCOUNTER — Ambulatory Visit (INDEPENDENT_AMBULATORY_CARE_PROVIDER_SITE_OTHER): Payer: Commercial Managed Care - HMO | Admitting: *Deleted

## 2015-07-19 DIAGNOSIS — I428 Other cardiomyopathies: Secondary | ICD-10-CM

## 2015-07-19 DIAGNOSIS — Z95810 Presence of automatic (implantable) cardiac defibrillator: Secondary | ICD-10-CM

## 2015-07-19 DIAGNOSIS — I5022 Chronic systolic (congestive) heart failure: Secondary | ICD-10-CM

## 2015-07-19 DIAGNOSIS — I429 Cardiomyopathy, unspecified: Secondary | ICD-10-CM

## 2015-07-19 NOTE — Progress Notes (Signed)
Remote ICD transmission.   

## 2015-07-22 LAB — CUP PACEART REMOTE DEVICE CHECK
Battery Remaining Longevity: 71 mo
Brady Statistic AP VP Percent: 19.29 %
Brady Statistic RA Percent Paced: 20.3 %
Brady Statistic RV Percent Paced: 85.53 %
HIGH POWER IMPEDANCE MEASURED VALUE: 72 Ohm
Implantable Lead Implant Date: 20151021
Implantable Lead Implant Date: 20151021
Implantable Lead Location: 753858
Implantable Lead Location: 753860
Implantable Lead Model: 4298
Lead Channel Impedance Value: 418 Ohm
Lead Channel Impedance Value: 456 Ohm
Lead Channel Impedance Value: 456 Ohm
Lead Channel Impedance Value: 551 Ohm
Lead Channel Impedance Value: 589 Ohm
Lead Channel Impedance Value: 703 Ohm
Lead Channel Impedance Value: 779 Ohm
Lead Channel Pacing Threshold Amplitude: 0.75 V
Lead Channel Pacing Threshold Pulse Width: 0.4 ms
Lead Channel Sensing Intrinsic Amplitude: 12 mV
Lead Channel Sensing Intrinsic Amplitude: 2.625 mV
Lead Channel Sensing Intrinsic Amplitude: 2.625 mV
Lead Channel Setting Pacing Amplitude: 2 V
Lead Channel Setting Pacing Amplitude: 2.5 V
MDC IDC LEAD IMPLANT DT: 20151021
MDC IDC LEAD LOCATION: 753859
MDC IDC MSMT BATTERY VOLTAGE: 2.97 V
MDC IDC MSMT LEADCHNL LV IMPEDANCE VALUE: 418 Ohm
MDC IDC MSMT LEADCHNL LV IMPEDANCE VALUE: 513 Ohm
MDC IDC MSMT LEADCHNL LV IMPEDANCE VALUE: 722 Ohm
MDC IDC MSMT LEADCHNL LV IMPEDANCE VALUE: 722 Ohm
MDC IDC MSMT LEADCHNL LV IMPEDANCE VALUE: 760 Ohm
MDC IDC MSMT LEADCHNL LV PACING THRESHOLD AMPLITUDE: 1.125 V
MDC IDC MSMT LEADCHNL LV PACING THRESHOLD PULSEWIDTH: 0.5 ms
MDC IDC MSMT LEADCHNL RA PACING THRESHOLD AMPLITUDE: 0.375 V
MDC IDC MSMT LEADCHNL RA PACING THRESHOLD PULSEWIDTH: 0.4 ms
MDC IDC MSMT LEADCHNL RV IMPEDANCE VALUE: 532 Ohm
MDC IDC MSMT LEADCHNL RV SENSING INTR AMPL: 12 mV
MDC IDC SESS DTM: 20170703092827
MDC IDC SET LEADCHNL LV PACING PULSEWIDTH: 1 ms
MDC IDC SET LEADCHNL RA PACING AMPLITUDE: 1.5 V
MDC IDC SET LEADCHNL RV PACING PULSEWIDTH: 0.4 ms
MDC IDC SET LEADCHNL RV SENSING SENSITIVITY: 0.3 mV
MDC IDC STAT BRADY AP VS PERCENT: 1.01 %
MDC IDC STAT BRADY AS VP PERCENT: 66.73 %
MDC IDC STAT BRADY AS VS PERCENT: 12.98 %

## 2015-07-23 ENCOUNTER — Encounter: Payer: Self-pay | Admitting: Cardiology

## 2015-08-06 ENCOUNTER — Encounter: Payer: Self-pay | Admitting: Cardiology

## 2015-08-19 ENCOUNTER — Other Ambulatory Visit: Payer: Self-pay | Admitting: Family Medicine

## 2015-08-23 ENCOUNTER — Other Ambulatory Visit: Payer: Self-pay | Admitting: Family Medicine

## 2015-09-02 ENCOUNTER — Other Ambulatory Visit: Payer: Self-pay | Admitting: Family Medicine

## 2015-09-17 ENCOUNTER — Encounter: Payer: Self-pay | Admitting: Family Medicine

## 2015-09-17 ENCOUNTER — Ambulatory Visit (INDEPENDENT_AMBULATORY_CARE_PROVIDER_SITE_OTHER): Payer: Commercial Managed Care - HMO | Admitting: Family Medicine

## 2015-09-17 DIAGNOSIS — K219 Gastro-esophageal reflux disease without esophagitis: Secondary | ICD-10-CM | POA: Diagnosis not present

## 2015-09-17 DIAGNOSIS — Z5329 Procedure and treatment not carried out because of patient's decision for other reasons: Secondary | ICD-10-CM | POA: Diagnosis not present

## 2015-09-17 DIAGNOSIS — Z5320 Procedure and treatment not carried out because of patient's decision for unspecified reasons: Secondary | ICD-10-CM

## 2015-09-17 DIAGNOSIS — I9589 Other hypotension: Secondary | ICD-10-CM

## 2015-09-17 DIAGNOSIS — I5022 Chronic systolic (congestive) heart failure: Secondary | ICD-10-CM | POA: Diagnosis not present

## 2015-09-17 NOTE — Patient Instructions (Addendum)
Thank you for coming in today, it was so nice to see you! Today we talked about:    Blood pressure: Please follow up in 1 month. Check your BP at home, let me know if it is lower than 90/50  Please follow up in 1 month . You can schedule this appointment at the front desk before you leave or call the clinic.  Bring in all your medications or supplements to each appointment for review.   If we ordered any tests today, you will be notified via telephone of any abnormalities. If everything is normal you will get a letter in the mail.   If you have any questions or concerns, please do not hesitate to call the office at [phone]. You can also message me directly via MyChart.   Sincerely,  Anders Simmonds, MD

## 2015-09-17 NOTE — Progress Notes (Signed)
Subjective:    Patient ID: [name] , female   DOB: 11-15-1950 , 65 y.o..   MRN: [id number]  HPI  Jeb LeveringCheryl J Benavides is here for to meet new PCP.  GERD:  Symptoms under control Continues to take Zantac Denies nausea, vomiting, abdominal pain, diarrhea Admits to some constipation at times Still resistant to getting a colonoscopy  HFrEF:  Plans to see cardiologist in the next month Last echocardiogram was 09/16/2014 which showed and EF to 35% Has an ICD placed and it hasn't fired  Denies any lightheadedness, dizziness, loss of consciousness, chest pain, shortness of breath, edema  Review of Systems: Per HPI. All other systems reviewed and are negative.  Past Medical History: Patient Active Problem List   Diagnosis Date Noted  . Colonoscopy refused 09/20/2015  . Left knee pain 02/15/2015  . Allergic rhinitis 11/09/2014  . Cardiac LV ejection fraction 21-40% 12/03/2013  . Cerebral infarction due to embolism of cerebral artery (HCC) 12/03/2013  . Chronic systolic congestive heart failure, NYHA class 2 (HCC) 11/05/2013  . GERD (gastroesophageal reflux disease)   . HLD (hyperlipidemia)   . Chronic hypotension 09/25/2013  . Non-ischemic cardiomyopathy (HCC) 08/11/2013  . Mitral regurgitation   . Acute systolic CHF (congestive heart failure), NYHA class 4 (HCC) 08/08/2013  . Left bundle branch block 08/07/2013  . Preventative health care 04/09/2013    Medications: reviewed and updated Current Outpatient Prescriptions  Medication Sig Dispense Refill  . atorvastatin (LIPITOR) 40 MG tablet Take 1 tablet (40 mg total) by mouth daily at 6 PM. 90 tablet 3  . carvedilol (COREG) 6.25 MG tablet Take 1 tablet (6.25 mg total) by mouth 2 (two) times daily with a meal. 60 tablet 3  . Cetirizine HCl 10 MG CAPS Take 1 capsule (10 mg total) by mouth daily. 30 capsule 1  . clopidogrel (PLAVIX) 75 MG tablet TAKE 1 TABLET BY MOUTH EVERY DAY 90 tablet 2  . fluticasone (FLONASE) 50 MCG/ACT  nasal spray USE 2 SPRAYS IN BOTH NOSTRILS DAILY (120/4=30) 16 g 0  . furosemide (LASIX) 40 MG tablet Take 1 tablet (40 mg total) by mouth daily. 90 tablet 1  . lisinopril (PRINIVIL,ZESTRIL) 5 MG tablet Take 1 tablet (5 mg total) by mouth 2 (two) times daily. 60 tablet 6  . ranitidine (ZANTAC) 150 MG tablet Take 1 tablet (150 mg total) by mouth 2 (two) times daily. 60 tablet 2   No current facility-administered medications for this visit.     Social Hx:  reports that she has never smoked. She has never used smokeless tobacco.    Objective:   BP 90/65 (Cuff Size: Normal) Comment (BP Location): right arm  Pulse 68   Temp 97.8 F (36.6 C) (Oral)   Ht 5\' 5"  (1.651 m)   Wt 173 lb 6.4 oz (78.7 kg)   BMI 28.86 kg/m  Physical Exam  Gen: NAD, alert, cooperative with exam, well-appearing HEENT: NCAT, PERRL, clear conjunctiva, oropharynx clear, supple neck Cardiac: Regular rate and rhythm, normal S1/S2, no murmur, no edema, capillary refill brisk  Respiratory: Clear to auscultation bilaterally, no wheezes, non-labored Gastrointestinal: soft, non tender, non distended, bowel sounds present Skin: no rashes, normal turgor  Psych: good insight, normal mood and affect   Assessment & Plan:  Colonoscopy refused Refusing colonoscopy at this time, risks discussed and patient understands.   Chronic systolic congestive heart failure, NYHA class 2 Stable. Euvolemic on exam. No AICD firings.  - Following up with cardiologist later this  month - Continue Coreg 6.25 mg BID, Lasix 40 mg daily, and Lisinopril 5 mg BID   GERD (gastroesophageal reflux disease) Controlled.  - Continue Zantac  Chronic hypotension BP borderline low at 90/65. Asymptomatic.  - Continue current medications - Patient will keep BP log at home and inform me if BP lower than 90/50 - Follow up in 1 month  Anders Simmonds, MD [facility], PGY-2

## 2015-09-20 DIAGNOSIS — Z5320 Procedure and treatment not carried out because of patient's decision for unspecified reasons: Secondary | ICD-10-CM | POA: Insufficient documentation

## 2015-09-20 NOTE — Assessment & Plan Note (Signed)
Refusing colonoscopy at this time, risks discussed and patient understands.

## 2015-09-20 NOTE — Assessment & Plan Note (Addendum)
BP borderline low at 90/65. Asymptomatic.  - Continue current medications - Patient will keep BP log at home and inform me if BP lower than 90/50 - Follow up in 1 month

## 2015-09-20 NOTE — Assessment & Plan Note (Signed)
Stable. Euvolemic on exam. No AICD firings.  - Following up with cardiologist later this month - Continue Coreg 6.25 mg BID, Lasix 40 mg daily, and Lisinopril 5 mg BID

## 2015-09-20 NOTE — Assessment & Plan Note (Signed)
Controlled.  - Continue Zantac

## 2015-10-18 ENCOUNTER — Ambulatory Visit (INDEPENDENT_AMBULATORY_CARE_PROVIDER_SITE_OTHER): Payer: Commercial Managed Care - HMO | Admitting: *Deleted

## 2015-10-18 DIAGNOSIS — I428 Other cardiomyopathies: Secondary | ICD-10-CM

## 2015-10-18 DIAGNOSIS — Z95810 Presence of automatic (implantable) cardiac defibrillator: Secondary | ICD-10-CM | POA: Diagnosis not present

## 2015-10-18 DIAGNOSIS — I5022 Chronic systolic (congestive) heart failure: Secondary | ICD-10-CM

## 2015-10-18 NOTE — Progress Notes (Signed)
Remote ICD transmission.   

## 2015-10-19 LAB — CUP PACEART REMOTE DEVICE CHECK
Battery Remaining Longevity: 68 mo
Brady Statistic AP VP Percent: 51.46 %
Brady Statistic AP VS Percent: 1.74 %
Brady Statistic RA Percent Paced: 53.2 %
Brady Statistic RV Percent Paced: 82.13 %
Date Time Interrogation Session: 20171002151446
HIGH POWER IMPEDANCE MEASURED VALUE: 78 Ohm
Implantable Lead Implant Date: 20151021
Implantable Lead Location: 753859
Implantable Lead Model: 4298
Implantable Lead Model: 5076
Lead Channel Impedance Value: 475 Ohm
Lead Channel Impedance Value: 475 Ohm
Lead Channel Impedance Value: 532 Ohm
Lead Channel Impedance Value: 703 Ohm
Lead Channel Impedance Value: 836 Ohm
Lead Channel Impedance Value: 893 Ohm
Lead Channel Impedance Value: 931 Ohm
Lead Channel Pacing Threshold Pulse Width: 0.4 ms
Lead Channel Pacing Threshold Pulse Width: 0.5 ms
Lead Channel Sensing Intrinsic Amplitude: 11 mV
Lead Channel Sensing Intrinsic Amplitude: 11 mV
Lead Channel Setting Pacing Amplitude: 1.5 V
Lead Channel Setting Pacing Amplitude: 2 V
Lead Channel Setting Pacing Amplitude: 2.5 V
Lead Channel Setting Pacing Pulse Width: 0.4 ms
Lead Channel Setting Pacing Pulse Width: 1 ms
MDC IDC LEAD IMPLANT DT: 20151021
MDC IDC LEAD IMPLANT DT: 20151021
MDC IDC LEAD LOCATION: 753858
MDC IDC LEAD LOCATION: 753860
MDC IDC MSMT BATTERY VOLTAGE: 2.97 V
MDC IDC MSMT LEADCHNL LV IMPEDANCE VALUE: 551 Ohm
MDC IDC MSMT LEADCHNL LV IMPEDANCE VALUE: 551 Ohm
MDC IDC MSMT LEADCHNL LV IMPEDANCE VALUE: 874 Ohm
MDC IDC MSMT LEADCHNL LV IMPEDANCE VALUE: 931 Ohm
MDC IDC MSMT LEADCHNL LV PACING THRESHOLD AMPLITUDE: 1.125 V
MDC IDC MSMT LEADCHNL RA IMPEDANCE VALUE: 608 Ohm
MDC IDC MSMT LEADCHNL RA PACING THRESHOLD AMPLITUDE: 0.375 V
MDC IDC MSMT LEADCHNL RA SENSING INTR AMPL: 4.625 mV
MDC IDC MSMT LEADCHNL RA SENSING INTR AMPL: 4.625 mV
MDC IDC MSMT LEADCHNL RV IMPEDANCE VALUE: 456 Ohm
MDC IDC MSMT LEADCHNL RV PACING THRESHOLD AMPLITUDE: 0.75 V
MDC IDC MSMT LEADCHNL RV PACING THRESHOLD PULSEWIDTH: 0.4 ms
MDC IDC SET LEADCHNL RV SENSING SENSITIVITY: 0.3 mV
MDC IDC STAT BRADY AS VP PERCENT: 31.15 %
MDC IDC STAT BRADY AS VS PERCENT: 15.66 %

## 2015-10-20 ENCOUNTER — Encounter: Payer: Self-pay | Admitting: Cardiology

## 2015-10-28 ENCOUNTER — Other Ambulatory Visit (HOSPITAL_COMMUNITY): Payer: Self-pay | Admitting: Cardiology

## 2015-10-28 ENCOUNTER — Encounter (HOSPITAL_COMMUNITY): Payer: Self-pay

## 2015-10-28 ENCOUNTER — Ambulatory Visit (HOSPITAL_COMMUNITY)
Admission: RE | Admit: 2015-10-28 | Discharge: 2015-10-28 | Disposition: A | Discharge status: Home / Self Care | Payer: Commercial Managed Care - HMO | Source: Ambulatory Visit | Attending: Cardiology | Admitting: Cardiology

## 2015-10-28 VITALS — BP 113/66 | HR 63 | Ht 65.0 in | Wt 171.8 lb

## 2015-10-28 DIAGNOSIS — Z95810 Presence of automatic (implantable) cardiac defibrillator: Secondary | ICD-10-CM | POA: Diagnosis not present

## 2015-10-28 DIAGNOSIS — I9589 Other hypotension: Secondary | ICD-10-CM | POA: Insufficient documentation

## 2015-10-28 DIAGNOSIS — Z7902 Long term (current) use of antithrombotics/antiplatelets: Secondary | ICD-10-CM | POA: Diagnosis not present

## 2015-10-28 DIAGNOSIS — I998 Other disorder of circulatory system: Secondary | ICD-10-CM

## 2015-10-28 DIAGNOSIS — I447 Left bundle-branch block, unspecified: Secondary | ICD-10-CM | POA: Diagnosis not present

## 2015-10-28 DIAGNOSIS — I5022 Chronic systolic (congestive) heart failure: Secondary | ICD-10-CM | POA: Insufficient documentation

## 2015-10-28 DIAGNOSIS — Z8673 Personal history of transient ischemic attack (TIA), and cerebral infarction without residual deficits: Secondary | ICD-10-CM | POA: Insufficient documentation

## 2015-10-28 DIAGNOSIS — E784 Other hyperlipidemia: Secondary | ICD-10-CM | POA: Diagnosis not present

## 2015-10-28 DIAGNOSIS — I639 Cerebral infarction, unspecified: Secondary | ICD-10-CM

## 2015-10-28 DIAGNOSIS — E7849 Other hyperlipidemia: Secondary | ICD-10-CM

## 2015-10-28 DIAGNOSIS — I428 Other cardiomyopathies: Secondary | ICD-10-CM | POA: Diagnosis not present

## 2015-10-28 DIAGNOSIS — I6523 Occlusion and stenosis of bilateral carotid arteries: Secondary | ICD-10-CM

## 2015-10-28 DIAGNOSIS — Z79899 Other long term (current) drug therapy: Secondary | ICD-10-CM | POA: Insufficient documentation

## 2015-10-28 LAB — LIPID PANEL
CHOL/HDL RATIO: 3.5 ratio
CHOLESTEROL: 155 mg/dL (ref 0–200)
HDL: 44 mg/dL (ref >40)
LDL Cholesterol: 90 mg/dL (ref 0–99)
Triglycerides: 103 mg/dL (ref <150)
VLDL: 21 mg/dL (ref 0–40)

## 2015-10-28 LAB — BASIC METABOLIC PANEL
Anion gap: 5 (ref 5–15)
BUN: 15 mg/dL (ref 6–20)
CHLORIDE: 109 mmol/L (ref 101–111)
CO2: 26 mmol/L (ref 22–32)
Calcium: 9.3 mg/dL (ref 8.9–10.3)
Creatinine, Ser: 0.92 mg/dL (ref 0.44–1.00)
GFR calc Af Amer: >60 mL/min (ref >60)
GFR calc non Af Amer: >60 mL/min (ref >60)
GLUCOSE: 86 mg/dL (ref 65–99)
POTASSIUM: 3.6 mmol/L (ref 3.5–5.1)
Sodium: 140 mmol/L (ref 135–145)

## 2015-10-28 LAB — CBC
HCT: 39.4 % (ref 36.0–46.0)
Hemoglobin: 12.7 g/dL (ref 12.0–15.0)
MCH: 29.3 pg (ref 26.0–34.0)
MCHC: 32.2 g/dL (ref 30.0–36.0)
MCV: 91 fL (ref 78.0–100.0)
PLATELETS: 222 10*3/uL (ref 150–400)
RBC: 4.33 MIL/uL (ref 3.87–5.11)
RDW: 13.6 % (ref 11.5–15.5)
WBC: 8.4 10*3/uL (ref 4.0–10.5)

## 2015-10-28 LAB — BRAIN NATRIURETIC PEPTIDE: B NATRIURETIC PEPTIDE 5: 16.3 pg/mL (ref 0.0–100.0)

## 2015-10-28 MED ORDER — SACUBITRIL-VALSARTAN 24-26 MG PO TABS: 1.0000 | ORAL_TABLET | Freq: Two times a day (BID) | ORAL | 3 refills | Status: DC

## 2015-10-28 NOTE — Patient Instructions (Signed)
Stop Lisinopril  Start Entresto 24/26 mg Twice daily STARTING Saturday 10/14 AM  Labs today  Labs in 10 days  Your physician has requested that you have an echocardiogram. Echocardiography is a painless test that uses sound waves to create images of your heart. It provides your doctor with information about the size and shape of your heart and how well your heart's chambers and valves are working. This procedure takes approximately one hour. There are no restrictions for this procedure.  Your physician has requested that you have a carotid duplex. This test is an ultrasound of the carotid arteries in your neck. It looks at blood flow through these arteries that supply the brain with blood. Allow one hour for this exam. There are no restrictions or special instructions.  Your physician recommends that you schedule a follow-up appointment in: 2 months

## 2015-10-29 NOTE — Progress Notes (Signed)
Patient ID: Sara ChuCheryl J Schrade, female   DOB: 07-10-1950, 65 y.o.   MRN: [id number] PCP: Dr Jordan LikesSchmitz EP: Dr Graciela HusbandsKlein.  Primary Cardiologist: Dr Shirlee LatchMcLean  Neurologist: Dr Pearlean BrownieSethi  HPI: [name] is a 65 y.o. female with chronic systolic CHF with nonischemic cardiomypathy (no significant CAD) s/p Medtronic CRT-D, LBBB, mild-mod MR and CVA (09/2013).  Coronary angiography in 7/15 showed no significant CAD.  Admitted in 9/15 with right sided facial droop and expressive aphasia, CVA on MRI. Had TEE showing EF 15% .   She returns for follow up. She can walk 2.5 miles without dyspnea.  She is short of breath with hills and stairs.  No orthopnea/PND though she does have bendopnea.  No chest pain.  No stroke-like symptoms.  She did not tolerate spironolactone due to dizziness.  Weight is up 4 lbs since last appointment. Of note, BP in right arm >> BP left arm. Last echo in 8/16 showed some improvement with EF 35-40%.    QRS narrowed, so in 11/16 device was reprogrammed to limit V-pacing.   Optivol: Fluid index < threshold, impedance stable.  No atrial fibrillation or VT.   ECG: NSR, nonspecific T wave flattening  PMH: 1. Nonischemic cardiomyopathy: Echo (7/15) with severe LV dilation, EF 25% with diffuse hypokinesis, mild to moderate MR.  RHC/ LHC 08/08/13 with RA 4, PA 23/8, PCWP 11, CI 2.52; no CAD.  TEE (9/15) with EF 15-20%, severe global hypokinesis, no LV thrombus, moderate MR, normal RV.  Echo (10/2013): EF 25-30%, grade I DD, mild AI. Medtronic CRT-D (10/15).  Echo (2/16) with EF 25-30%, diffuse hypokinesis worse in the septum, grade I diastolic dysfunction, normal RV size and systolic function, mild-moderate MR.  - Echo (8/16) with EF 35-40%, diffuse hypokinesis, mild-moderate mitral regurgitation.  2. CVA: 9/15 with dysarthria.  Carotids 9/15 with no significant CAD.  3. LBBB- Implant Medtronic CRT-D (10/2013) 4. Hyperlipidemia  Labs 09/23/13 K 5.1 Creatinine 0.68 Cholesterol 166 TGl 83 HDL 41, LDL  108 10/07/13: K 3.9, creatinine 0.65, LDL 108 11/03/13: K 3.8, creatinine 0.8 11/20/13: K 3.9 Creatinine 0.66  1/16: K 3.6, creatinine 0.77 04/08/2014: K 4.2 Creatinine 0.73 5/16: K 4, creatinine 0.72 8/16: LDL 79, HDL 44 9/16: K 3.8, creatinine 0.76 1/17: Hgb 12.5  SH: lives with her son and husband. Does not drink or smoke. Currently not working.   FH: Mom had A fib and Heart Failure. Deceased [id number]        Sister has lupus  ROS: All systems negative except as listed in HPI, PMH and Problem List.   Current Outpatient Prescriptions  Medication Sig Dispense Refill  . atorvastatin (LIPITOR) 40 MG tablet Take 1 tablet (40 mg total) by mouth daily at 6 PM. 90 tablet 3  . carvedilol (COREG) 6.25 MG tablet Take 1 tablet (6.25 mg total) by mouth 2 (two) times daily with a meal. 60 tablet 3  . clopidogrel (PLAVIX) 75 MG tablet TAKE 1 TABLET BY MOUTH EVERY DAY 90 tablet 2  . fluticasone (FLONASE) 50 MCG/ACT nasal spray USE 2 SPRAYS IN BOTH NOSTRILS DAILY (120/4=30) 16 g 0  . furosemide (LASIX) 40 MG tablet Take 1 tablet (40 mg total) by mouth daily. 90 tablet 1  . ranitidine (ZANTAC) 150 MG tablet Take 1 tablet (150 mg total) by mouth 2 (two) times daily. 60 tablet 2  . sacubitril-valsartan (ENTRESTO) 24-26 MG Take 1 tablet by mouth 2 (two) times daily. 60 tablet 3   No current facility-administered medications for this  encounter.     Vitals:   10/28/15 1005  BP: 113/66  BP Location: Right Arm  Patient Position: Sitting  Cuff Size: Normal  Pulse: 63  SpO2: 100%  Weight: 171 lb 12.8 oz (77.9 kg)  Height: 5\' 5"  (1.651 m)    PHYSICAL EXAM: General:  Well appearing. No resp difficulty. Son present HEENT: normal Neck: supple. JVP 7 cm. Carotids 2+ bilaterally; no bruits. No subclavian bruit appreciated.  No lymphadenopathy or thryomegaly appreciated. Cor: PMI normal. Regular rate & rhythm. No rubs, gallops or murmurs. Lungs: clear Abdomen: soft, nontender, nondistended. No  hepatosplenomegaly. No bruits or masses. Good bowel sounds. Extremities: no cyanosis, clubbing, rash, edema Neuro: alert & orientedx3, cranial nerves grossly intact. Moves all 4 extremities w/o difficulty. Affect pleasant. Difficult word finding.   ASSESSMENT & PLAN:  1. Chronic Systolic Heart Failure: Nonischemic cardiomyopathy s/p CRT-D in 10/15, coronary angiography without CAD in 07/2013. Echo done in 2/16, EF 25-30%.  Repeat echo in 8/16 with EF up to 35-40%.  NYHA class II. Euvolemic by exam and Optivol. - Continue Lasix 40 mg daily.  - Continue Coreg 6.25 mg bid.  - Stop lisinopril, after 36 hours will start Entresto 24/26 bid with BMET/BNP today and repeat BMET in 10 days.  - QRS now narrowed, device has been reprogrammed to limit v-pacing. 2. CVA: ?Embolic.  No atrial fibrillation has been detected so far on device.  She will continue Plavix for now. If future device interrogation shows atrial fibrillation, she will need to be anticoagulated.   3. ?Left subclavian stenosis: Much lower BP on left.  Will get arterial doppler to evaluate.   Followup in 2 months.   [name] 10/29/2015

## 2015-11-01 ENCOUNTER — Telehealth (HOSPITAL_COMMUNITY): Payer: Self-pay | Admitting: *Deleted

## 2015-11-01 MED ORDER — ATORVASTATIN CALCIUM 80 MG PO TABS: 80.0000 mg | ORAL_TABLET | Freq: Every day | ORAL | 3 refills | Status: DC

## 2015-11-01 NOTE — Telephone Encounter (Signed)
Increase atorvastatin

## 2015-11-02 ENCOUNTER — Telehealth (HOSPITAL_COMMUNITY): Payer: Self-pay | Admitting: Pharmacist

## 2015-11-02 NOTE — Telephone Encounter (Signed)
Entresto 24-26 mg BID PA approved by [facility] Part D through 10/29/17.  Tyler Deis. Bonnye Fava, PharmD, BCPS, CPP Clinical Pharmacist Pager: [phone] Phone: [phone] 11/02/2015 2:51 PM

## 2015-11-03 ENCOUNTER — Inpatient Hospital Stay (HOSPITAL_COMMUNITY): Admission: RE | Admit: 2015-11-03 | Payer: Commercial Managed Care - HMO | Source: Ambulatory Visit

## 2015-11-03 ENCOUNTER — Other Ambulatory Visit: Payer: Self-pay

## 2015-11-03 ENCOUNTER — Encounter: Payer: Self-pay | Admitting: Cardiology

## 2015-11-03 ENCOUNTER — Other Ambulatory Visit: Payer: Self-pay | Admitting: Family Medicine

## 2015-11-03 ENCOUNTER — Telehealth (HOSPITAL_COMMUNITY): Payer: Self-pay | Admitting: Pharmacist

## 2015-11-03 MED ORDER — SACUBITRIL-VALSARTAN 24-26 MG PO TABS: 1.0000 | ORAL_TABLET | Freq: Two times a day (BID) | ORAL | 3 refills | Status: DC

## 2015-11-03 NOTE — Telephone Encounter (Signed)
Spoke with [name] during her husband's visit. She would like her Entresto sent to [facility] mail order pharmacy because they stated that her medications would be $0 for 90 day supplies. At [facility] a 30 day supply would be $8.35. I have asked them to place the Rx back on file and have sent a new 90 day Rx for Entresto 24-26 mg BID to [facility] mail order pharmacy.   Tyler Deis. Bonnye Fava, PharmD, BCPS, CPP Clinical Pharmacist Pager: [phone] Phone: [phone] 11/03/2015 11:16 AM

## 2015-11-04 ENCOUNTER — Other Ambulatory Visit (HOSPITAL_COMMUNITY): Payer: Self-pay | Admitting: *Deleted

## 2015-11-04 MED ORDER — FUROSEMIDE 40 MG PO TABS: 40.0000 mg | ORAL_TABLET | Freq: Every day | ORAL | 1 refills | Status: DC

## 2015-11-04 MED ORDER — RANITIDINE HCL 150 MG PO TABS: ORAL_TABLET | ORAL | 2 refills | Status: DC

## 2015-11-05 ENCOUNTER — Other Ambulatory Visit: Payer: Self-pay | Admitting: *Deleted

## 2015-11-05 MED ORDER — RANITIDINE HCL 150 MG PO TABS: ORAL_TABLET | ORAL | 1 refills | Status: DC

## 2015-11-05 MED ORDER — ATORVASTATIN CALCIUM 80 MG PO TABS: 80.0000 mg | ORAL_TABLET | Freq: Every day | ORAL | 3 refills | Status: DC

## 2015-11-08 ENCOUNTER — Other Ambulatory Visit (HOSPITAL_COMMUNITY): Payer: Self-pay

## 2015-11-10 ENCOUNTER — Encounter: Payer: Self-pay | Admitting: Cardiology

## 2015-11-11 ENCOUNTER — Other Ambulatory Visit: Payer: Self-pay | Admitting: *Deleted

## 2015-11-11 ENCOUNTER — Other Ambulatory Visit (HOSPITAL_COMMUNITY): Payer: Self-pay | Admitting: *Deleted

## 2015-11-11 ENCOUNTER — Telehealth: Payer: Self-pay | Admitting: *Deleted

## 2015-11-11 MED ORDER — CARVEDILOL 6.25 MG PO TABS: 6.2500 mg | ORAL_TABLET | Freq: Two times a day (BID) | ORAL | 3 refills | Status: DC

## 2015-11-11 MED ORDER — FLUTICASONE PROPIONATE 50 MCG/ACT NA SUSP: NASAL | 0 refills | Status: DC

## 2015-11-11 MED ORDER — CLOPIDOGREL BISULFATE 75 MG PO TABS: 75.0000 mg | ORAL_TABLET | Freq: Every day | ORAL | 3 refills | Status: DC

## 2015-11-11 NOTE — Telephone Encounter (Signed)
Phone request from [facility], [place], [place]

## 2015-11-11 NOTE — Telephone Encounter (Signed)
Thank you I will contact them but patient is no longer taking losartan I have faxed them and denied the script.

## 2015-11-11 NOTE — Telephone Encounter (Signed)
PLEASE CALL [facility] AS THEY ARE ASKING FOR LISINOPRIL 5 MG ON THIS PT, SHE IS DR MCLEAN'S PT AT [facility], SPOKE WITH LORI BUT SHE SAID YOU CAN SPEAK WITH ANYONE THERE, CALL [phone], THANKS

## 2015-11-12 NOTE — Telephone Encounter (Signed)
Patient is on entresto. She can not take entresto and lisinopril together. Lisinopril was discontinued.

## 2015-11-15 ENCOUNTER — Other Ambulatory Visit: Payer: Self-pay | Admitting: Family Medicine

## 2015-11-15 ENCOUNTER — Ambulatory Visit (HOSPITAL_COMMUNITY)
Admission: RE | Admit: 2015-11-15 | Discharge: 2015-11-15 | Disposition: A | Discharge status: Home / Self Care | Payer: Commercial Managed Care - HMO | Source: Ambulatory Visit | Attending: Cardiology | Admitting: Cardiology

## 2015-11-15 DIAGNOSIS — I6523 Occlusion and stenosis of bilateral carotid arteries: Secondary | ICD-10-CM | POA: Diagnosis not present

## 2015-11-15 DIAGNOSIS — E785 Hyperlipidemia, unspecified: Secondary | ICD-10-CM | POA: Insufficient documentation

## 2015-11-15 DIAGNOSIS — I998 Other disorder of circulatory system: Secondary | ICD-10-CM | POA: Diagnosis not present

## 2015-11-16 ENCOUNTER — Other Ambulatory Visit: Payer: Self-pay | Admitting: Family Medicine

## 2015-11-22 ENCOUNTER — Ambulatory Visit (HOSPITAL_COMMUNITY)
Admission: RE | Admit: 2015-11-22 | Discharge: 2015-11-22 | Disposition: A | Discharge status: Home / Self Care | Payer: Commercial Managed Care - HMO | Source: Ambulatory Visit | Attending: Internal Medicine | Admitting: Internal Medicine

## 2015-11-22 DIAGNOSIS — I5022 Chronic systolic (congestive) heart failure: Secondary | ICD-10-CM | POA: Diagnosis not present

## 2015-11-22 LAB — BASIC METABOLIC PANEL
Anion gap: 9 (ref 5–15)
BUN: 12 mg/dL (ref 6–20)
CALCIUM: 9 mg/dL (ref 8.9–10.3)
CO2: 22 mmol/L (ref 22–32)
CREATININE: 0.82 mg/dL (ref 0.44–1.00)
Chloride: 109 mmol/L (ref 101–111)
GFR calc Af Amer: >60 mL/min (ref >60)
GLUCOSE: 110 mg/dL — AB (ref 65–99)
POTASSIUM: 3.2 mmol/L — AB (ref 3.5–5.1)
SODIUM: 140 mmol/L (ref 135–145)

## 2015-11-24 ENCOUNTER — Telehealth (HOSPITAL_COMMUNITY): Payer: Self-pay | Admitting: *Deleted

## 2015-11-24 DIAGNOSIS — I5022 Chronic systolic (congestive) heart failure: Secondary | ICD-10-CM

## 2015-11-24 MED ORDER — POTASSIUM CHLORIDE CRYS ER 20 MEQ PO TBCR: 20.0000 meq | EXTENDED_RELEASE_TABLET | Freq: Every day | ORAL | 3 refills | Status: DC

## 2015-11-24 NOTE — Telephone Encounter (Signed)
-----   Message from [name], MD sent at 11/23/2015 10:37 PM EST ----- Add KCl 20 daily with BMET in 1 week.

## 2015-11-24 NOTE — Telephone Encounter (Signed)
Notes Recorded by Modesta Messing, CMA on 11/24/2015 at 4:51 PM EST Patient aware. Added to lab schedule 11/16. K sent to [facility]   ------  Notes Recorded by [name], MD on 11/23/2015 at 10:37 PM EST Add KCl 20 daily with BMET in 1 week.    Ref Range & Units 2d ago 3wk ago 50yr ago   Sodium 135 - 145 mmol/L 140  140  138    Potassium 3.5 - 5.1 mmol/L 3.2   3.6  3.8    Chloride 101 - 111 mmol/L 109  109  104    CO2 22 - 32 mmol/L 22  26  25     Glucose, Bld 65 - 99 mg/dL 909   86  78    BUN 6 - 20 mg/dL 12  15  14     Creatinine, Ser 0.44 - 1.00 mg/dL 3.11  2.16  2.44    Calcium 8.9 - 10.3 mg/dL 9.0  9.3  9.0    GFR calc non Af Amer >60 mL/min >60  >60  >60    GFR calc Af Amer >60 mL/min >60  >60CM  >60CM

## 2015-12-03 ENCOUNTER — Ambulatory Visit (HOSPITAL_COMMUNITY)
Admission: RE | Admit: 2015-12-03 | Discharge: 2015-12-03 | Disposition: A | Discharge status: Home / Self Care | Payer: Commercial Managed Care - HMO | Source: Ambulatory Visit | Attending: Internal Medicine | Admitting: Internal Medicine

## 2015-12-03 DIAGNOSIS — I5022 Chronic systolic (congestive) heart failure: Secondary | ICD-10-CM | POA: Diagnosis not present

## 2015-12-03 LAB — BASIC METABOLIC PANEL
Anion gap: 7 (ref 5–15)
BUN: 14 mg/dL (ref 6–20)
CO2: 26 mmol/L (ref 22–32)
Calcium: 9.4 mg/dL (ref 8.9–10.3)
Chloride: 107 mmol/L (ref 101–111)
Creatinine, Ser: 0.71 mg/dL (ref 0.44–1.00)
Glucose, Bld: 80 mg/dL (ref 65–99)
POTASSIUM: 3.7 mmol/L (ref 3.5–5.1)
SODIUM: 140 mmol/L (ref 135–145)

## 2015-12-06 ENCOUNTER — Other Ambulatory Visit: Payer: Self-pay | Admitting: Family Medicine

## 2015-12-31 ENCOUNTER — Ambulatory Visit (HOSPITAL_COMMUNITY)
Admission: RE | Admit: 2015-12-31 | Discharge: 2015-12-31 | Disposition: A | Discharge status: Home / Self Care | Payer: Commercial Managed Care - HMO | Source: Ambulatory Visit | Attending: Cardiology | Admitting: Cardiology

## 2015-12-31 ENCOUNTER — Ambulatory Visit (HOSPITAL_BASED_OUTPATIENT_CLINIC_OR_DEPARTMENT_OTHER)
Admission: RE | Admit: 2015-12-31 | Discharge: 2015-12-31 | Disposition: A | Discharge status: Home / Self Care | Payer: Commercial Managed Care - HMO | Source: Ambulatory Visit | Attending: Cardiology | Admitting: Cardiology

## 2015-12-31 ENCOUNTER — Other Ambulatory Visit: Payer: Self-pay | Admitting: Cardiology

## 2015-12-31 VITALS — BP 102/68 | HR 62 | Wt 170.5 lb

## 2015-12-31 DIAGNOSIS — I5022 Chronic systolic (congestive) heart failure: Secondary | ICD-10-CM

## 2015-12-31 DIAGNOSIS — Z8673 Personal history of transient ischemic attack (TIA), and cerebral infarction without residual deficits: Secondary | ICD-10-CM | POA: Diagnosis not present

## 2015-12-31 DIAGNOSIS — E784 Other hyperlipidemia: Secondary | ICD-10-CM | POA: Diagnosis not present

## 2015-12-31 DIAGNOSIS — I340 Nonrheumatic mitral (valve) insufficiency: Secondary | ICD-10-CM | POA: Diagnosis not present

## 2015-12-31 DIAGNOSIS — I447 Left bundle-branch block, unspecified: Secondary | ICD-10-CM | POA: Diagnosis not present

## 2015-12-31 DIAGNOSIS — E7849 Other hyperlipidemia: Secondary | ICD-10-CM

## 2015-12-31 LAB — ECHOCARDIOGRAM COMPLETE
AVPHT: 698 ms
CHL CUP DOP CALC LVOT VTI: 18.1 cm
CHL CUP LV S' LATERAL: 6.81 cm/s
CHL CUP MV DEC (S): 268
CHL CUP RV SYS PRESS: 24 mmHg
CHL CUP TV REG PEAK VELOCITY: 227 cm/s
EERAT: 7.89
EWDT: 268 ms
FS: 30 % (ref 28–44)
IVS/LV PW RATIO, ED: 0.93
LA ID, A-P, ES: 34 mm
LA vol A4C: 51.1 ml
LA vol index: 28.3 mL/m2
LA vol: 54.2 mL
LADIAMINDEX: 1.78 cm/m2
LEFT ATRIUM END SYS DIAM: 34 mm
LV E/e' medial: 7.89
LV PW d: 9.63 mm — AB (ref 0.6–1.1)
LV SIMPSON'S DISK: 57
LV TDI E'LATERAL: 8.61
LV TDI E'MEDIAL: 9.17
LV dias vol index: 46 mL/m2
LV dias vol: 89 mL (ref 46–106)
LV e' LATERAL: 8.61 cm/s
LV sys vol index: 20 mL/m2
LVEEAVG: 7.89
LVOT SV: 41 mL
LVOT area: 2.27 cm2
LVOT diameter: 17 mm
LVOT peak vel: 92.8 cm/s
LVSYSVOL: 38 mL (ref 14–42)
MV VTI: 193 cm
MVPKAVEL: 87.8 m/s
MVPKEVEL: 67.9 m/s
PISA EROA: 0.14 cm2
RV LATERAL S' VELOCITY: 12.1 cm/s
Stroke v: 50 ml
TAPSE: 32.8 mm
TRMAXVEL: 227 cm/s

## 2015-12-31 NOTE — Patient Instructions (Signed)
Labs in 1 Month  Follow up in 4 months

## 2015-12-31 NOTE — Progress Notes (Signed)
Echocardiogram 2D Echocardiogram has been performed.  [name] 12/31/2015, 9:32 AM

## 2016-01-01 NOTE — Progress Notes (Signed)
Patient ID: [name], female   DOB: 29-Nov-1950, 65 y.o.   MRN: [id number] PCP: Dr Jordan LikesSchmitz EP: Dr Graciela HusbandsKlein.  Primary Cardiologist: Dr Shirlee LatchMcLean  Neurologist: Dr Pearlean BrownieSethi  HPI: [name] is a 65 y.o. female with chronic systolic CHF with nonischemic cardiomypathy (no significant CAD) s/p Medtronic CRT-D, LBBB, mild-mod MR and CVA (09/2013).  Coronary angiography in 7/15 showed no significant CAD.  Admitted in 9/15 with right sided facial droop and expressive aphasia, CVA on MRI. Had TEE showing EF 15%.    QRS narrowed, so in 11/16 device was reprogrammed to limit V-pacing.   She returns for follow up. Overall doing well.  She can walk as far as she wants with no dyspnea.  She walks daily for > 1 mile.  No orthopnea/PND.  No chest pain.  Weight down 1 lb.  I reviewed today's echo, EF up to 45-50%.    Optivol: Fluid index < threshold, impedance stable.  No atrial fibrillation or VT.   ECG (10/17): NSR, narrow QRS, nonspecific T wave flattening  PMH: 1. Nonischemic cardiomyopathy: Echo (7/15) with severe LV dilation, EF 25% with diffuse hypokinesis, mild to moderate MR.  RHC/ LHC 08/08/13 with RA 4, PA 23/8, PCWP 11, CI 2.52; no CAD.  TEE (9/15) with EF 15-20%, severe global hypokinesis, no LV thrombus, moderate MR, normal RV.  Echo (10/2013): EF 25-30%, grade I DD, mild AI. Medtronic CRT-D (10/15).  Echo (2/16) with EF 25-30%, diffuse hypokinesis worse in the septum, grade I diastolic dysfunction, normal RV size and systolic function, mild-moderate MR.  - Echo (8/16) with EF 35-40%, diffuse hypokinesis, mild-moderate mitral regurgitation.  - Echo (12/17): EF 45-50%, mild LVH, mild to moderate MR.  2. CVA: 9/15 with dysarthria.  Carotids 9/15 with no significant CAD.  - Carotid dopplers (10/17): mild plaque, normal subclavians.  3. LBBB: Medtronic CRT-D (10/2013) 4. Hyperlipidemia  Labs 09/23/13 K 5.1 Creatinine 0.68 Cholesterol 166 TGl 83 HDL 41, LDL 108 10/07/13: K 3.9, creatinine 0.65, LDL  108 11/03/13: K 3.8, creatinine 0.8 11/20/13: K 3.9 Creatinine 0.66  1/16: K 3.6, creatinine 0.77 04/08/2014: K 4.2 Creatinine 0.73 5/16: K 4, creatinine 0.72 8/16: LDL 79, HDL 44 9/16: K 3.8, creatinine 0.76 1/17: Hgb 12.5 11/17: K 3.7, creatinine 0.71, LDL 90  SH: lives with her son and husband. Does not drink or smoke. Currently not working.   FH: Mom had A fib and Heart Failure. Deceased [id number]        Sister has lupus  ROS: All systems negative except as listed in HPI, PMH and Problem List.   Current Outpatient Prescriptions  Medication Sig Dispense Refill  . atorvastatin (LIPITOR) 80 MG tablet Take 1 tablet (80 mg total) by mouth daily at 6 PM. 90 tablet 3  . carvedilol (COREG) 6.25 MG tablet Take 1 tablet (6.25 mg total) by mouth 2 (two) times daily with a meal. 60 tablet 3  . clopidogrel (PLAVIX) 75 MG tablet Take 1 tablet (75 mg total) by mouth daily. 90 tablet 3  . fluticasone (FLONASE) 50 MCG/ACT nasal spray USE 2 SPRAYS IN EACH NOSTRIL EVERY DAY 16 g 0  . furosemide (LASIX) 40 MG tablet Take 1 tablet (40 mg total) by mouth daily. 90 tablet 1  . potassium chloride SA (K-DUR,KLOR-CON) 20 MEQ tablet Take 1 tablet (20 mEq total) by mouth daily. 30 tablet 3  . ranitidine (ZANTAC) 150 MG tablet Take 1 tablet (150 mg total) by mouth 2 (two) times daily. 60 tablet 3  .  sacubitril-valsartan (ENTRESTO) 24-26 MG Take 1 tablet by mouth 2 (two) times daily. 180 tablet 3   No current facility-administered medications for this encounter.     Vitals:   12/31/15 0957  BP: 102/68  Pulse: 62  SpO2: 100%  Weight: 170 lb 8 oz (77.3 kg)    PHYSICAL EXAM: General:  Well appearing. No resp difficulty. Son present HEENT: normal Neck: supple. JVP 7 cm. Carotids 2+ bilaterally; no bruits. No subclavian bruit appreciated.  No lymphadenopathy or thryomegaly appreciated. Cor: PMI normal. Regular rate & rhythm. No rubs, gallops or murmurs. Lungs: clear Abdomen: soft, nontender, nondistended.  No hepatosplenomegaly. No bruits or masses. Good bowel sounds. Extremities: no cyanosis, clubbing, rash, edema Neuro: alert & orientedx3, cranial nerves grossly intact. Moves all 4 extremities w/o difficulty. Affect pleasant. Difficult word finding.   ASSESSMENT & PLAN:  1. Chronic Systolic Heart Failure: Nonischemic cardiomyopathy s/p CRT-D in 10/15, coronary angiography without CAD in 07/2013. Echo done in 2/16, EF 25-30%.  Repeat echo in 8/16 with EF up to 35-40%.  Finally, I reviewed today's echo which showed EF 45-50%.  NYHA class II. Euvolemic by exam and Optivol. - Continue Lasix 40 mg daily.  - Continue Coreg 6.25 mg bid.  - Continue Entresto 24/26 bid.    - QRS now narrowed, device has been reprogrammed to limit v-pacing. 2. CVA: ?Embolic.  No atrial fibrillation has been detected so far on device.  She will continue Plavix for now. If future device interrogation shows atrial fibrillation, she will need to be anticoagulated.   3. Hyperlipidemia: Goal LDL< 70 with h/o CVA.  Atorvastatin increased about a month ago.  Check lipids/LFTs in 1 month.    Followup in 4 months.   [name] 01/01/2016

## 2016-01-03 ENCOUNTER — Other Ambulatory Visit: Payer: Self-pay | Admitting: Family Medicine

## 2016-01-05 NOTE — Progress Notes (Signed)
Patient Care Team: Beaulah Dinning, MD as PCP - General (Family Medicine)   HPI  [name] is a 65 y.o. female Seen in followup for CRT-D 2015 for NICM LBBB and CHF  She is S/p CVA without interval detection of atrial fibrillation.      July 2015. and was found to have an ejection fraction 20-25%. Catheterization demonstrated no coronary disease. S  DATE TEST    *7**/15    Echo   EF 20-25 %   12/17    Echo   EF 45-50 %         She has minimal SOB and no edema   Past Medical History:  Diagnosis Date  . Arthritis   . Automatic implantable cardioverter-defibrillator in situ   . Cardiac resynchronization therapy defibrillator (CRT-D) in place    a. placed on 11/05/13.  [name] CHF (congestive heart failure) (HCC)   . GERD (gastroesophageal reflux disease)   . HLD (hyperlipidemia)   . Hypotension   . LBBB (left bundle branch block)   . Mitral regurgitation    a. Mild-mod MR by echo 07/2013.  [name] PONV (postoperative nausea and vomiting)   . Stroke (HCC) 09/23/2013  . Systolic CHF (HCC)    a. Dx 07/2013 - due to NICM. Cath normal cors. EF 25%.    Past Surgical History:  Procedure Laterality Date  . BI-VENTRICULAR IMPLANTABLE CARDIOVERTER DEFIBRILLATOR N/A 11/05/2013   Procedure: BI-VENTRICULAR IMPLANTABLE CARDIOVERTER DEFIBRILLATOR  (CRT-D);  Surgeon: Duke Salvia, MD;  Location[facility] CATH LAB;  Service: Cardiovascular;  Laterality: N/A;  . BI-VENTRICULAR IMPLANTABLE CARDIOVERTER DEFIBRILLATOR  (CRT-D)  11/05/2013  . CARDIAC CATHETERIZATION  07/2013  . KNEE ARTHROSCOPY Right   . LEFT AND RIGHT HEART CATHETERIZATION WITH CORONARY ANGIOGRAM N/A 08/08/2013   Procedure: LEFT AND RIGHT HEART CATHETERIZATION WITH CORONARY ANGIOGRAM;  Surgeon: [name], MD;  Location: [facility] CATH LAB;  Service: Cardiovascular;  Laterality: N/A;  . TEE WITHOUT CARDIOVERSION N/A 09/26/2013   Procedure: TRANSESOPHAGEAL ECHOCARDIOGRAM (TEE);  Surgeon: [name], MD;  Location: [facility]  ENDOSCOPY;  Service: Cardiovascular;  Laterality: N/A;  . TONSILLECTOMY    . WRIST ARTHROSCOPY  12/21/2010   Procedure: ARTHROSCOPY WRIST;  Surgeon: [name], MD;  Location: [facility];  Service: Orthopedics;  Laterality: Right;  right wrist, repair TFCC on right, open reconstruction ECU sheath    Current Outpatient Prescriptions  Medication Sig Dispense Refill  . atorvastatin (LIPITOR) 80 MG tablet Take 1 tablet (80 mg total) by mouth daily at 6 PM. 90 tablet 3  . carvedilol (COREG) 6.25 MG tablet Take 1 tablet (6.25 mg total) by mouth 2 (two) times daily with a meal. 60 tablet 3  . clopidogrel (PLAVIX) 75 MG tablet Take 1 tablet (75 mg total) by mouth daily. 90 tablet 3  . fluticasone (FLONASE) 50 MCG/ACT nasal spray USE 2 SPRAYS IN EACH NOSTRIL EVERY DAY 16 g 0  . furosemide (LASIX) 40 MG tablet Take 1 tablet (40 mg total) by mouth daily. 90 tablet 1  . potassium chloride SA (K-DUR,KLOR-CON) 20 MEQ tablet Take 1 tablet (20 mEq total) by mouth daily. 30 tablet 3  . ranitidine (ZANTAC) 150 MG tablet Take 1 tablet (150 mg total) by mouth 2 (two) times daily. 60 tablet 3  . sacubitril-valsartan (ENTRESTO) 24-26 MG Take 1 tablet by mouth 2 (two) times daily. 180 tablet 3   No current facility-administered medications for this visit.  Allergies  Allergen Reactions  . Onion Other (See Comments)    gas  . Tomato Other (See Comments)    reflux    Review of Systems negative except from HPI and PMH  Physical Exam BP 100/62   Pulse 66   Ht 5\' 5"  (1.651 m)   Wt 170 lb 3.2 oz (77.2 kg)   SpO2 98%   BMI 28.32 kg/m  Well developed and well nourished in no acute distress HENT normal E scleral and icterus clear Neck Supple JVP flat; carotids brisk and full Clear to ausculation  Regular rate and rhythm, no murmurs gallops or rub Soft with active bowel sounds No clubbing cyanosis  Edema Alert and oriented, mild residual weakness Skin Warm and Dry  ECG demonstrates  sinus rhythm at 60 with intervals 13/13/43 Left bundle branch block  Assessment and  Plan   Nonischemic cardio myopathy  Congestive heart failure-chronic systolic  Left bundle branch block  Prior stroke with residual weakness   Pt now with normal/narrow QRS  LV lead has been programmed off, and we have reprogrammed her VVI to prevent pacing  She is euvolemic and currently class 2  Continue current meds

## 2016-01-06 ENCOUNTER — Encounter: Payer: Self-pay | Admitting: Internal Medicine

## 2016-01-06 ENCOUNTER — Ambulatory Visit (INDEPENDENT_AMBULATORY_CARE_PROVIDER_SITE_OTHER): Payer: Commercial Managed Care - HMO | Admitting: Internal Medicine

## 2016-01-06 VITALS — BP 100/62 | HR 66 | Ht 65.0 in | Wt 170.2 lb

## 2016-01-06 DIAGNOSIS — I5022 Chronic systolic (congestive) heart failure: Secondary | ICD-10-CM

## 2016-01-06 DIAGNOSIS — Z95810 Presence of automatic (implantable) cardiac defibrillator: Secondary | ICD-10-CM | POA: Diagnosis not present

## 2016-01-06 DIAGNOSIS — I428 Other cardiomyopathies: Secondary | ICD-10-CM

## 2016-01-06 DIAGNOSIS — I447 Left bundle-branch block, unspecified: Secondary | ICD-10-CM | POA: Diagnosis not present

## 2016-01-06 LAB — CUP PACEART INCLINIC DEVICE CHECK
Battery Remaining Longevity: 76 mo
Brady Statistic AS VS Percent: 17.36 %
Brady Statistic RV Percent Paced: 81.06 %
HIGH POWER IMPEDANCE MEASURED VALUE: 80 Ohm
Implantable Lead Implant Date: 20151021
Implantable Lead Location: 753858
Implantable Lead Location: 753860
Implantable Lead Model: 5076
Implantable Pulse Generator Implant Date: 20151021
Lead Channel Impedance Value: 456 Ohm
Lead Channel Impedance Value: 475 Ohm
Lead Channel Impedance Value: 532 Ohm
Lead Channel Impedance Value: 703 Ohm
Lead Channel Impedance Value: 874 Ohm
Lead Channel Impedance Value: 988 Ohm
Lead Channel Pacing Threshold Amplitude: 0.5 V
Lead Channel Pacing Threshold Pulse Width: 0.4 ms
Lead Channel Sensing Intrinsic Amplitude: 13.125 mV
Lead Channel Setting Sensing Sensitivity: 0.3 mV
MDC IDC LEAD IMPLANT DT: 20151021
MDC IDC LEAD IMPLANT DT: 20151021
MDC IDC LEAD LOCATION: 753859
MDC IDC LEAD MODEL: 4298
MDC IDC MSMT BATTERY VOLTAGE: 2.97 V
MDC IDC MSMT LEADCHNL LV IMPEDANCE VALUE: 513 Ohm
MDC IDC MSMT LEADCHNL LV IMPEDANCE VALUE: 608 Ohm
MDC IDC MSMT LEADCHNL LV IMPEDANCE VALUE: 608 Ohm
MDC IDC MSMT LEADCHNL LV IMPEDANCE VALUE: 950 Ohm
MDC IDC MSMT LEADCHNL LV IMPEDANCE VALUE: 950 Ohm
MDC IDC MSMT LEADCHNL LV IMPEDANCE VALUE: 988 Ohm
MDC IDC MSMT LEADCHNL LV PACING THRESHOLD AMPLITUDE: 3.5 V
MDC IDC MSMT LEADCHNL LV PACING THRESHOLD PULSEWIDTH: 1 ms
MDC IDC MSMT LEADCHNL RA IMPEDANCE VALUE: 608 Ohm
MDC IDC MSMT LEADCHNL RA SENSING INTR AMPL: 4 mV
MDC IDC MSMT LEADCHNL RV PACING THRESHOLD AMPLITUDE: 0.75 V
MDC IDC MSMT LEADCHNL RV PACING THRESHOLD PULSEWIDTH: 0.4 ms
MDC IDC SESS DTM: 20171221084619
MDC IDC SET LEADCHNL RV PACING AMPLITUDE: 2 V
MDC IDC SET LEADCHNL RV PACING PULSEWIDTH: 0.4 ms
MDC IDC STAT BRADY AP VP PERCENT: 19.55 %
MDC IDC STAT BRADY AP VS PERCENT: 1.15 %
MDC IDC STAT BRADY AS VP PERCENT: 61.94 %
MDC IDC STAT BRADY RA PERCENT PACED: 20.6 %

## 2016-01-06 NOTE — Patient Instructions (Signed)
Medication Instructions: - Your physician recommends that you continue on your current medications as directed. Please refer to the Current Medication list given to you today.  Labwork: - none ordered  Procedures/Testing: - none ordered  Follow-Up: - Remote monitoring is used to monitor your Pacemaker of ICD from home. This monitoring reduces the number of office visits required to check your device to one time per year. It allows [place] to keep an eye on the functioning of your device to ensure it is working properly. You are scheduled for a device check from home on 04/06/16. You may send your transmission at any time that day. If you have a wireless device, the transmission will be sent automatically. After your physician reviews your transmission, you will receive a postcard with your next transmission date.  - Your physician wants you to follow-up in: 1 year with Gypsy Balsam, NP for Dr. Graciela Husbands. You will receive a reminder letter in the mail two months in advance. If you don't receive a letter, please call our office to schedule the follow-up appointment.  Any Additional Special Instructions Will Be Listed Below (If Applicable).     If you need a refill on your cardiac medications before your next appointment, please call your pharmacy.

## 2016-01-07 ENCOUNTER — Other Ambulatory Visit: Payer: Self-pay | Admitting: Internal Medicine

## 2016-01-31 ENCOUNTER — Other Ambulatory Visit: Payer: Self-pay | Admitting: Family Medicine

## 2016-01-31 ENCOUNTER — Other Ambulatory Visit: Payer: Self-pay | Admitting: Cardiology

## 2016-02-01 ENCOUNTER — Ambulatory Visit (HOSPITAL_COMMUNITY)
Admission: RE | Admit: 2016-02-01 | Discharge: 2016-02-01 | Disposition: A | Discharge status: Home / Self Care | Payer: Commercial Managed Care - HMO | Source: Ambulatory Visit | Attending: Cardiology | Admitting: Cardiology

## 2016-02-01 DIAGNOSIS — I5022 Chronic systolic (congestive) heart failure: Secondary | ICD-10-CM | POA: Diagnosis not present

## 2016-02-01 LAB — HEPATIC FUNCTION PANEL
ALK PHOS: 204 U/L — AB (ref 38–126)
ALT: 56 U/L — AB (ref 14–54)
AST: 40 U/L (ref 15–41)
Albumin: 3.9 g/dL (ref 3.5–5.0)
Bilirubin, Direct: 0.2 mg/dL (ref 0.1–0.5)
Indirect Bilirubin: 0.5 mg/dL (ref 0.3–0.9)
TOTAL PROTEIN: 7.4 g/dL (ref 6.5–8.1)
Total Bilirubin: 0.7 mg/dL (ref 0.3–1.2)

## 2016-02-01 LAB — LIPID PANEL
CHOL/HDL RATIO: 3.7 ratio
Cholesterol: 146 mg/dL (ref 0–200)
HDL: 40 mg/dL — AB (ref >40)
LDL CALC: 77 mg/dL (ref 0–99)
Triglycerides: 145 mg/dL (ref <150)
VLDL: 29 mg/dL (ref 0–40)

## 2016-02-04 ENCOUNTER — Telehealth (HOSPITAL_COMMUNITY): Payer: Self-pay | Admitting: *Deleted

## 2016-02-04 DIAGNOSIS — R748 Abnormal levels of other serum enzymes: Secondary | ICD-10-CM

## 2016-02-04 NOTE — Telephone Encounter (Signed)
Notes Recorded by Noralee Space, RN on 02/04/2016 at 10:36 AM EST Pt aware and agreeable to abd u/s but needs to wait till first week in Feb. Will arrange and call her back with date/time   Abd u/s sch for 2/5 at 9:30 and pt has to be NPO 6 hours prior, pt aware and agreeable.

## 2016-02-04 NOTE — Telephone Encounter (Signed)
-----   Message from [name], MD sent at 02/03/2016 10:30 AM EST ----- LDL ok but elevated alkaline phosphatase.  Would get abdominal US, RUQ, to look at liver and gallbladder.  Forward labs to PCP.

## 2016-02-21 ENCOUNTER — Ambulatory Visit (HOSPITAL_COMMUNITY)
Admission: RE | Admit: 2016-02-21 | Discharge: 2016-02-21 | Disposition: A | Discharge status: Home / Self Care | Payer: Medicare HMO | Source: Ambulatory Visit | Attending: Cardiology | Admitting: Cardiology

## 2016-02-21 DIAGNOSIS — R748 Abnormal levels of other serum enzymes: Secondary | ICD-10-CM | POA: Diagnosis not present

## 2016-02-21 DIAGNOSIS — K8020 Calculus of gallbladder without cholecystitis without obstruction: Secondary | ICD-10-CM | POA: Diagnosis not present

## 2016-02-21 DIAGNOSIS — R945 Abnormal results of liver function studies: Secondary | ICD-10-CM | POA: Diagnosis not present

## 2016-02-29 ENCOUNTER — Other Ambulatory Visit: Payer: Self-pay | Admitting: Family Medicine

## 2016-03-14 ENCOUNTER — Other Ambulatory Visit: Payer: Self-pay | Admitting: Cardiology

## 2016-03-22 ENCOUNTER — Other Ambulatory Visit: Payer: Self-pay | Admitting: Family Medicine

## 2016-04-02 ENCOUNTER — Telehealth: Payer: Self-pay | Admitting: Physician Assistant

## 2016-04-02 NOTE — Telephone Encounter (Signed)
Pt called because she had taken an extra Coreg 6.25 mg by mistake. SBP 100s now, HR 60s What to do?  Advised her to drink a glass of water.  Do not think dose is high enough to cause her to pass out. Take it easy today, follow her BP and HR. SBP 90s and HR 50s is ok, if lower, call back.  Coreg will wear off, go back to regular dosing this pm if she feels ok.  Theodore Demark, Cordelia Poche 04/02/2016 2:54 PM Beeper [phone]

## 2016-04-06 ENCOUNTER — Ambulatory Visit (INDEPENDENT_AMBULATORY_CARE_PROVIDER_SITE_OTHER): Payer: Medicare HMO | Admitting: *Deleted

## 2016-04-06 DIAGNOSIS — I428 Other cardiomyopathies: Secondary | ICD-10-CM | POA: Diagnosis not present

## 2016-04-06 NOTE — Progress Notes (Signed)
Remote ICD transmission.   

## 2016-04-07 ENCOUNTER — Encounter: Payer: Self-pay | Admitting: Cardiology

## 2016-04-08 LAB — CUP PACEART REMOTE DEVICE CHECK
Battery Remaining Longevity: 79 mo
Battery Voltage: 2.98 V
Brady Statistic AP VP Percent: 0 %
Brady Statistic AP VS Percent: 0 %
Brady Statistic RA Percent Paced: 0 %
Brady Statistic RV Percent Paced: 0.01 %
Date Time Interrogation Session: 20180322121146
HighPow Impedance: 86 Ohm
Implantable Lead Implant Date: 20151021
Implantable Lead Implant Date: 20151021
Implantable Lead Location: 753859
Implantable Lead Location: 753860
Implantable Lead Model: 4298
Implantable Lead Model: 5076
Implantable Pulse Generator Implant Date: 20151021
Lead Channel Impedance Value: 418 Ohm
Lead Channel Impedance Value: 418 Ohm
Lead Channel Impedance Value: 418 Ohm
Lead Channel Impedance Value: 513 Ohm
Lead Channel Impedance Value: 532 Ohm
Lead Channel Impedance Value: 608 Ohm
Lead Channel Impedance Value: 760 Ohm
Lead Channel Impedance Value: 836 Ohm
Lead Channel Impedance Value: 836 Ohm
Lead Channel Impedance Value: 836 Ohm
Lead Channel Pacing Threshold Amplitude: 0.375 V
Lead Channel Pacing Threshold Amplitude: 0.625 V
Lead Channel Pacing Threshold Pulse Width: 0.4 ms
Lead Channel Sensing Intrinsic Amplitude: 11.5 mV
Lead Channel Sensing Intrinsic Amplitude: 11.5 mV
Lead Channel Sensing Intrinsic Amplitude: 4.75 mV
Lead Channel Setting Pacing Amplitude: 2 V
Lead Channel Setting Pacing Pulse Width: 0.4 ms
Lead Channel Setting Sensing Sensitivity: 0.3 mV
MDC IDC LEAD IMPLANT DT: 20151021
MDC IDC LEAD LOCATION: 753858
MDC IDC MSMT LEADCHNL LV IMPEDANCE VALUE: 551 Ohm
MDC IDC MSMT LEADCHNL LV IMPEDANCE VALUE: 817 Ohm
MDC IDC MSMT LEADCHNL LV PACING THRESHOLD AMPLITUDE: 1.125 V
MDC IDC MSMT LEADCHNL LV PACING THRESHOLD PULSEWIDTH: 0.5 ms
MDC IDC MSMT LEADCHNL RA IMPEDANCE VALUE: 646 Ohm
MDC IDC MSMT LEADCHNL RA SENSING INTR AMPL: 4.75 mV
MDC IDC MSMT LEADCHNL RV PACING THRESHOLD PULSEWIDTH: 0.4 ms
MDC IDC STAT BRADY AS VP PERCENT: 0.01 %
MDC IDC STAT BRADY AS VS PERCENT: 99.99 %

## 2016-04-13 ENCOUNTER — Encounter: Payer: Self-pay | Admitting: Family Medicine

## 2016-04-13 ENCOUNTER — Ambulatory Visit (INDEPENDENT_AMBULATORY_CARE_PROVIDER_SITE_OTHER): Payer: Medicare HMO | Admitting: *Deleted

## 2016-04-13 ENCOUNTER — Ambulatory Visit (INDEPENDENT_AMBULATORY_CARE_PROVIDER_SITE_OTHER): Payer: Medicare HMO | Admitting: Family Medicine

## 2016-04-13 ENCOUNTER — Encounter: Payer: Self-pay | Admitting: *Deleted

## 2016-04-13 VITALS — BP 82/56 | HR 62 | Temp 98.3°F | Ht 65.0 in | Wt 170.0 lb

## 2016-04-13 DIAGNOSIS — H6502 Acute serous otitis media, left ear: Secondary | ICD-10-CM | POA: Diagnosis not present

## 2016-04-13 DIAGNOSIS — I5022 Chronic systolic (congestive) heart failure: Secondary | ICD-10-CM

## 2016-04-13 DIAGNOSIS — Z0000 Encounter for general adult medical examination without abnormal findings: Secondary | ICD-10-CM | POA: Diagnosis not present

## 2016-04-13 DIAGNOSIS — I9589 Other hypotension: Secondary | ICD-10-CM | POA: Diagnosis not present

## 2016-04-13 NOTE — Assessment & Plan Note (Signed)
Likely due to allergies.  Increase flonase

## 2016-04-13 NOTE — Assessment & Plan Note (Signed)
I believe she is asymptomatic.  I attribute her swimmy headedness to eustacean tube dysfunction.

## 2016-04-13 NOTE — Progress Notes (Signed)
   Subjective:    Patient ID: [name], female    DOB: [birth year], 66 y.o.   MRN: [id number]  HPI [name] was here for her annual wellness visit.  Nurse became concerned about her low BP and asked me to see.   Patient has never had hypertension and, in fact, has had lowish BPs all her life.  Now with non-ischemic cardiomyopathy and on multiple medications that also lower BP.  She has not had syncope or presyncope feelings.  Reviewed prior BPs and she is currently within the range of previous BPs.  She states she does feel a little swimmy headed today and attributes it to her ear being congested.  Has chronic nasal congestion, recently worse with springtime allergies.  Uses flonase only one spray each nostril daily.    Review of Systems     Objective:   Physical Exam VS noted.  Wt is within her usual range.  She weighs every day.  Wt this am was 166. Right TM normal.  Lt TM retracted. Lungs clear Cardiac RRR without m or g Ext trace bilateral edema.        Assessment & Plan:

## 2016-04-13 NOTE — Patient Instructions (Addendum)
I am not too worried about the low blood pressure.  If you have symptoms of light headedness or feel like you might pass out, see Dr. Shirlee Latch who might need to lower some of your heart failure meds. Use the flonase every day and increase to two sprays in each nostril. I will call with the blood test results.

## 2016-04-13 NOTE — Assessment & Plan Note (Signed)
On multiple meds that lower BP - she seems to be tolerating.  Discussed symptoms that she should be aware of.

## 2016-04-13 NOTE — Progress Notes (Signed)
Subjective:   [name] is a 66 y.o. female who presents for an Initial Medicare Annual Wellness Visit.   Cardiac Risk Factors include: advanced age (>4men, >17 women);dyslipidemia;obesity (BMI >30kg/m2)     Objective:    Today's Vitals   04/13/16 0933 04/13/16 0934 04/13/16 0958 04/13/16 1000  BP:  (!) 86/50 (!) 74/50 (!) 82/56  Pulse:  62    Temp:  98.3 F (36.8 C)    TempSrc:  Oral    SpO2:  99%    Weight: 170 lb (77.1 kg)     Height: 5\' 5"  (1.651 m)     PainSc:  5      Body mass index is 28.29 kg/m. BP rechecks as follows: 74/50 Right arm manually after performing TUG test 82/56 Left arm manually 2 minutes later Denies dizziness/lightheadedness at this time  Current Medications (verified) Outpatient Encounter Prescriptions as of 04/13/2016  Medication Sig  . atorvastatin (LIPITOR) 80 MG tablet Take 1 tablet (80 mg total) by mouth daily at 6 PM.  . carvedilol (COREG) 6.25 MG tablet TAKE 1 TABLET TWICE DAILY WITH MEALS  . clopidogrel (PLAVIX) 75 MG tablet Take 1 tablet (75 mg total) by mouth daily.  . fluticasone (FLONASE) 50 MCG/ACT nasal spray USE 2 SPRAYS IN EACH NOSTRIL EVERY DAY  . furosemide (LASIX) 40 MG tablet TAKE 1 TABLET (40 MG TOTAL) BY MOUTH DAILY.  [name] potassium chloride SA (K-DUR,KLOR-CON) 20 MEQ tablet TAKE 1 TABLET (20 MEQ TOTAL) BY MOUTH DAILY.  . ranitidine (ZANTAC) 150 MG tablet Take 1 tablet (150 mg total) by mouth 2 (two) times daily.  . sacubitril-valsartan (ENTRESTO) 24-26 MG Take 1 tablet by mouth 2 (two) times daily.   No facility-administered encounter medications on file as of 04/13/2016.     Allergies (verified) Onion and Tomato   History: Past Medical History:  Diagnosis Date  . Arthritis   . Automatic implantable cardioverter-defibrillator in situ   . Cardiac resynchronization therapy defibrillator (CRT-D) in place    a. placed on 11/05/13.  [name] CHF (congestive heart failure) (HCC)   . GERD (gastroesophageal reflux  disease)   . HLD (hyperlipidemia)   . Hypotension   . LBBB (left bundle branch block)   . Mitral regurgitation    a. Mild-mod MR by echo 07/2013.  [name] PONV (postoperative nausea and vomiting)   . Stroke (HCC) 09/23/2013  . Systolic CHF (HCC)    a. Dx 07/2013 - due to NICM. Cath normal cors. EF 25%.   Past Surgical History:  Procedure Laterality Date  . BI-VENTRICULAR IMPLANTABLE CARDIOVERTER DEFIBRILLATOR N/A 11/05/2013   Procedure: BI-VENTRICULAR IMPLANTABLE CARDIOVERTER DEFIBRILLATOR  (CRT-D);  Surgeon: Duke Salvia, MD;  Location: [facility] CATH LAB;  Service: Cardiovascular;  Laterality: N/A;  . BI-VENTRICULAR IMPLANTABLE CARDIOVERTER DEFIBRILLATOR  (CRT-D)  11/05/2013  . CARDIAC CATHETERIZATION  07/2013  . KNEE ARTHROSCOPY Right   . LEFT AND RIGHT HEART CATHETERIZATION WITH CORONARY ANGIOGRAM N/A 08/08/2013   Procedure: LEFT AND RIGHT HEART CATHETERIZATION WITH CORONARY ANGIOGRAM;  Surgeon: [name], MD;  Location[facility] CATH LAB;  Service: Cardiovascular;  Laterality: N/A;  . TEE WITHOUT CARDIOVERSION N/A 09/26/2013   Procedure: TRANSESOPHAGEAL ECHOCARDIOGRAM (TEE);  Surgeon: [name], MD;  Location: [facility] ENDOSCOPY;  Service: Cardiovascular;  Laterality: N/A;  . TONSILLECTOMY    . WRIST ARTHROSCOPY  12/21/2010   Procedure: ARTHROSCOPY WRIST;  Surgeon: [name], MD;  Location: [facility];  Service: Orthopedics;  Laterality: Right;  right wrist, repair TFCC on right, open reconstruction ECU sheath   Family History  Problem Relation Age of Onset  . Congestive Heart Failure Mother   . Cancer Mother 23    Breast  . Cancer Father 72    Oat Cell CA  . Lupus Sister   . Heart disease Brother    Social History   Occupational History  . Not on file.   Social History Main Topics  . Smoking status: Never Smoker  . Smokeless tobacco: Never Used  . Alcohol use No  . Drug use: No  . Sexual activity: Yes    Tobacco Counseling Counseling given: Yes Patient  has never smoked and has no plans to start.  Activities of Daily Living In your present state of health, do you have any difficulty performing the following activities: 04/13/2016  Hearing? N  Vision? N  Difficulty concentrating or making decisions? N  Walking or climbing stairs? Y  Dressing or bathing? N  Doing errands, shopping? N  Preparing Food and eating ? N  Using the Toilet? N  In the past six months, have you accidently leaked urine? N  Do you have problems with loss of bowel control? N  Managing your Medications? N  Managing your Finances? N  Housekeeping or managing your Housekeeping? N  Some recent data might be hidden   Home Safety:  My home has a working smoke alarm:  Yes X 2-3           My home throw rugs have been fastened down to the floor or removed:  Removed I have non-slip mats in the bathtub and shower:  Yes         All my home's stairs have railings or bannisters: One level home with no outside steps           My home's floors, stairs and hallways are free from clutter, wires and cords:  Yes     I wear seatbelts consistently:  Yes   Immunizations and Health Maintenance Immunization History  Administered Date(s) Administered  . Influenza,inj,Quad PF,36+ Mos 04/09/2013, 11/06/2013, 09/24/2014  . Influenza-Unspecified 11/17/2015  . Pneumococcal Polysaccharide-23 09/25/2013   Health Maintenance Due  Topic Date Due  . TETANUS/TDAP  01/03/1968  . MAMMOGRAM  08/14/2009  . PNA vac Low Risk Adult (1 of 2 - PCV13) 09/26/2014  Patient wanted to hold off on prevnar today due to low BP  Patient Care Team: Beaulah Dinning, MD as PCP - General (Family Medicine) [name], MD as Consulting Physician (Cardiology) [facility]   Indicate any recent Medical Services you may have received from other than [facility] providers in the past year (date may be approximate).     Assessment:   This is a routine wellness examination for [place].    Hearing/Vision screen  Patient heard all sounds at 40 dB  Dietary issues and exercise activities discussed: Current Exercise Habits: Home exercise routine, Type of exercise: walking, Time (Minutes): 20, Frequency (Times/Week): 6, Weekly Exercise (Minutes/Week): 120, Exercise limited by: orthopedic condition(s);cardiac condition(s)  Goals    None    Patient became lightheaded and felt vision was cloudy prior to discussing goals. Visit stopped and patient placed on Dr. Cyndia Skeeters schedule 2/2 symptomatic hypotension.   Depression Screen PHQ 2/9 Scores 04/13/2016 04/13/2016 09/17/2015 02/11/2015 11/09/2014 06/29/2014 04/16/2014  PHQ - 2 Score 0 0 0 0 0 0 0    Fall Risk Fall Risk  04/13/2016 04/13/2016 09/17/2015 11/09/2014 04/16/2014  Falls  in the past year? No No No No No  Risk for fall due to : - History of fall(s);Impaired mobility;Impaired balance/gait - - -  TUG Test:  Done in 13 seconds. Patient used both hands to push out of chair and one hand to sit back down.  Cognitive Function: Mini-Cog  Passed with score 5/5  Screening Tests Health Maintenance  Topic Date Due  . TETANUS/TDAP  01/03/1968  . MAMMOGRAM  08/14/2009  . PNA vac Low Risk Adult (1 of 2 - PCV13) 09/26/2014  . COLONOSCOPY  11/08/2024  . INFLUENZA VACCINE  Completed  . DEXA SCAN  Completed  . Hepatitis C Screening  Completed      Plan:     During the course of the visit, [name] was educated and counseled about the following appropriate screening and preventive services:   Vaccines to include Pneumoccal, Influenza, Td, Cardiovascular disease screening  Colorectal cancer screening  Bone density screening  Diabetes screening  Mammography/PAP  Patient Instructions (the written plan) were given to the patient.    Fredderick Severance, RN   04/13/2016

## 2016-04-13 NOTE — Patient Instructions (Addendum)
Fall Prevention in the Home Falls can cause injuries. They can happen to people of all ages. There are many things you can do to make your home safe and to help prevent falls. What can I do on the outside of my home?  Regularly fix the edges of walkways and driveways and fix any cracks.  Remove anything that might make you trip as you walk through a door, such as a raised step or threshold.  Trim any bushes or trees on the path to your home.  Use bright outdoor lighting.  Clear any walking paths of anything that might make someone trip, such as rocks or tools.  Regularly check to see if handrails are loose or broken. Make sure that both sides of any steps have handrails.  Any raised decks and porches should have guardrails on the edges.  Have any leaves, snow, or ice cleared regularly.  Use sand or salt on walking paths during winter.  Clean up any spills in your garage right away. This includes oil or grease spills. What can I do in the bathroom?  Use night lights.  Install grab bars by the toilet and in the tub and shower. Do not use towel bars as grab bars.  Use non-skid mats or decals in the tub or shower.  If you need to sit down in the shower, use a plastic, non-slip stool.  Keep the floor dry. Clean up any water that spills on the floor as soon as it happens.  Remove soap buildup in the tub or shower regularly.  Attach bath mats securely with double-sided non-slip rug tape.  Do not have throw rugs and other things on the floor that can make you trip. What can I do in the bedroom?  Use night lights.  Make sure that you have a light by your bed that is easy to reach.  Do not use any sheets or blankets that are too big for your bed. They should not hang down onto the floor.  Have a firm chair that has side arms. You can use this for support while you get dressed.  Do not have throw rugs and other things on the floor that can make you trip. What can I do in  the kitchen?  Clean up any spills right away.  Avoid walking on wet floors.  Keep items that you use a lot in easy-to-reach places.  If you need to reach something above you, use a strong step stool that has a grab bar.  Keep electrical cords out of the way.  Do not use floor polish or wax that makes floors slippery. If you must use wax, use non-skid floor wax.  Do not have throw rugs and other things on the floor that can make you trip. What can I do with my stairs?  Do not leave any items on the stairs.  Make sure that there are handrails on both sides of the stairs and use them. Fix handrails that are broken or loose. Make sure that handrails are as long as the stairways.  Check any carpeting to make sure that it is firmly attached to the stairs. Fix any carpet that is loose or worn.  Avoid having throw rugs at the top or bottom of the stairs. If you do have throw rugs, attach them to the floor with carpet tape.  Make sure that you have a light switch at the top of the stairs and the bottom of the stairs. If you  do not have them, ask someone to add them for you. What else can I do to help prevent falls?  Wear shoes that:  Do not have high heels.  Have rubber bottoms.  Are comfortable and fit you well.  Are closed at the toe. Do not wear sandals.  If you use a stepladder:  Make sure that it is fully opened. Do not climb a closed stepladder.  Make sure that both sides of the stepladder are locked into place.  Ask someone to hold it for you, if possible.  Clearly mark and make sure that you can see:  Any grab bars or handrails.  First and last steps.  Where the edge of each step is.  Use tools that help you move around (mobility aids) if they are needed. These include:  Canes.  Walkers.  Scooters.  Crutches.  Turn on the lights when you go into a dark area. Replace any light bulbs as soon as they burn out.  Set up your furniture so you have a clear  path. Avoid moving your furniture around.  If any of your floors are uneven, fix them.  If there are any pets around you, be aware of where they are.  Review your medicines with your doctor. Some medicines can make you feel dizzy. This can increase your chance of falling. Ask your doctor what other things that you can do to help prevent falls. This information is not intended to replace advice given to you by your health care provider. Make sure you discuss any questions you have with your health care provider. Document Released: 10/29/2008 Document Revised: 06/10/2015 Document Reviewed: 02/06/2014 Elsevier Interactive Patient Education  2017 [place] Maintenance, Female Adopting a healthy lifestyle and getting preventive care can go a long way to promote health and wellness. Talk with your health care provider about what schedule of regular examinations is right for you. This is a good chance for you to check in with your provider about disease prevention and staying healthy. In between checkups, there are plenty of things you can do on your own. Experts have done a lot of research about which lifestyle changes and preventive measures are most likely to keep you healthy. Ask your health care provider for more information. Weight and diet Eat a healthy diet  Be sure to include plenty of vegetables, fruits, low-fat dairy products, and lean protein.  Do not eat a lot of foods high in solid fats, added sugars, or salt.  Get regular exercise. This is one of the most important things you can do for your health.  Most adults should exercise for at least 150 minutes each week. The exercise should increase your heart rate and make you sweat (moderate-intensity exercise).  Most adults should also do strengthening exercises at least twice a week. This is in addition to the moderate-intensity exercise. Maintain a healthy weight  Body mass index (BMI) is a measurement that can be used  to identify possible weight problems. It estimates body fat based on height and weight. Your health care provider can help determine your BMI and help you achieve or maintain a healthy weight.  For females 45 years of age and older:  A BMI below 18.5 is considered underweight.  A BMI of 18.5 to 24.9 is normal.  A BMI of 25 to 29.9 is considered overweight.  A BMI of 30 and above is considered obese. Watch levels of cholesterol and blood lipids  You should start having  your blood tested for lipids and cholesterol at 68 years of age, then have this test every 5 years.  You may need to have your cholesterol levels checked more often if:  Your lipid or cholesterol levels are high.  You are older than 68 years of age.  You are at high risk for heart disease. Cancer screening Lung Cancer  Lung cancer screening is recommended for adults 55-80 years old who are at high risk for lung cancer because of a history of smoking.  A yearly low-dose CT scan of the lungs is recommended for people who:  Currently smoke.  Have quit within the past 15 years.  Have at least a 30-pack-year history of smoking. A pack year is smoking an average of one pack of cigarettes a day for 1 year.  Yearly screening should continue until it has been 15 years since you quit.  Yearly screening should stop if you develop a health problem that would prevent you from having lung cancer treatment. Breast Cancer  Practice breast self-awareness. This means understanding how your breasts normally appear and feel.  It also means doing regular breast self-exams. Let your health care provider know about any changes, no matter how small.  If you are in your 20s or 30s, you should have a clinical breast exam (CBE) by a health care provider every 1-3 years as part of a regular health exam.  If you are 40 or older, have a CBE every year. Also consider having a breast X-ray (mammogram) every year.  If you have a family  history of breast cancer, talk to your health care provider about genetic screening.  If you are at high risk for breast cancer, talk to your health care provider about having an MRI and a mammogram every year.  Breast cancer gene (BRCA) assessment is recommended for women who have family members with BRCA-related cancers. BRCA-related cancers include:  Breast.  Ovarian.  Tubal.  Peritoneal cancers.  Results of the assessment will determine the need for genetic counseling and BRCA1 and BRCA2 testing. Cervical Cancer  Your health care provider may recommend that you be screened regularly for cancer of the pelvic organs (ovaries, uterus, and vagina). This screening involves a pelvic examination, including checking for microscopic changes to the surface of your cervix (Pap test). You may be encouraged to have this screening done every 3 years, beginning at age 21.  For women ages 30-65, health care providers may recommend pelvic exams and Pap testing every 3 years, or they may recommend the Pap and pelvic exam, combined with testing for human papilloma virus (HPV), every 5 years. Some types of HPV increase your risk of cervical cancer. Testing for HPV may also be done on women of any age with unclear Pap test results.  Other health care providers may not recommend any screening for nonpregnant women who are considered low risk for pelvic cancer and who do not have symptoms. Ask your health care provider if a screening pelvic exam is right for you.  If you have had past treatment for cervical cancer or a condition that could lead to cancer, you need Pap tests and screening for cancer for at least 20 years after your treatment. If Pap tests have been discontinued, your risk factors (such as having a new sexual partner) need to be reassessed to determine if screening should resume. Some women have medical problems that increase the chance of getting cervical cancer. In these cases, your health care  provider may   recommend more frequent screening and Pap tests. Colorectal Cancer  This type of cancer can be detected and often prevented.  Routine colorectal cancer screening usually begins at 68 years of age and continues through 68 years of age.  Your health care provider may recommend screening at an earlier age if you have risk factors for colon cancer.  Your health care provider may also recommend using home test kits to check for hidden blood in the stool.  A small camera at the end of a tube can be used to examine your colon directly (sigmoidoscopy or colonoscopy). This is done to check for the earliest forms of colorectal cancer.  Routine screening usually begins at age 50.  Direct examination of the colon should be repeated every 5-10 years through 68 years of age. However, you may need to be screened more often if early forms of precancerous polyps or small growths are found. Skin Cancer  Check your skin from head to toe regularly.  Tell your health care provider about any new moles or changes in moles, especially if there is a change in a mole's shape or color.  Also tell your health care provider if you have a mole that is larger than the size of a pencil eraser.  Always use sunscreen. Apply sunscreen liberally and repeatedly throughout the day.  Protect yourself by wearing long sleeves, pants, a wide-brimmed hat, and sunglasses whenever you are outside. Heart disease, diabetes, and high blood pressure  High blood pressure causes heart disease and increases the risk of stroke. High blood pressure is more likely to develop in:  People who have blood pressure in the high end of the normal range (130-139/85-89 mm Hg).  People who are overweight or obese.  People who are African American.  If you are 18-39 years of age, have your blood pressure checked every 3-5 years. If you are 40 years of age or older, have your blood pressure checked every year. You should have your  blood pressure measured twice-once when you are at a hospital or clinic, and once when you are not at a hospital or clinic. Record the average of the two measurements. To check your blood pressure when you are not at a hospital or clinic, you can use:  An automated blood pressure machine at a pharmacy.  A home blood pressure monitor.  If you are between 55 years and 79 years old, ask your health care provider if you should take aspirin to prevent strokes.  Have regular diabetes screenings. This involves taking a blood sample to check your fasting blood sugar level.  If you are at a normal weight and have a low risk for diabetes, have this test once every three years after 68 years of age.  If you are overweight and have a high risk for diabetes, consider being tested at a younger age or more often. Preventing infection Hepatitis B  If you have a higher risk for hepatitis B, you should be screened for this virus. You are considered at high risk for hepatitis B if:  You were born in a country where hepatitis B is common. Ask your health care provider which countries are considered high risk.  Your parents were born in a high-risk country, and you have not been immunized against hepatitis B (hepatitis B vaccine).  You have HIV or AIDS.  You use needles to inject street drugs.  You live with someone who has hepatitis B.  You have had sex with someone who   has hepatitis B.  You get hemodialysis treatment.  You take certain medicines for conditions, including cancer, organ transplantation, and autoimmune conditions. Hepatitis C  Blood testing is recommended for:  Everyone born from [birth year] through 1967.  Anyone with known risk factors for hepatitis C. Sexually transmitted infections (STIs)  You should be screened for sexually transmitted infections (STIs) including gonorrhea and chlamydia if:  You are sexually active and are younger than 68 years of age.  You are older than 68  years of age and your health care provider tells you that you are at risk for this type of infection.  Your sexual activity has changed since you were last screened and you are at an increased risk for chlamydia or gonorrhea. Ask your health care provider if you are at risk.  If you do not have HIV, but are at risk, it may be recommended that you take a prescription medicine daily to prevent HIV infection. This is called pre-exposure prophylaxis (PrEP). You are considered at risk if:  You are sexually active and do not regularly use condoms or know the HIV status of your partner(s).  You take drugs by injection.  You are sexually active with a partner who has HIV. Talk with your health care provider about whether you are at high risk of being infected with HIV. If you choose to begin PrEP, you should first be tested for HIV. You should then be tested every 3 months for as long as you are taking PrEP. Pregnancy  If you are premenopausal and you may become pregnant, ask your health care provider about preconception counseling.  If you may become pregnant, take 400 to 800 micrograms (mcg) of folic acid every day.  If you want to prevent pregnancy, talk to your health care provider about birth control (contraception). Osteoporosis and menopause  Osteoporosis is a disease in which the bones lose minerals and strength with aging. This can result in serious bone fractures. Your risk for osteoporosis can be identified using a bone density scan.  If you are 64 years of age or older, or if you are at risk for osteoporosis and fractures, ask your health care provider if you should be screened.  Ask your health care provider whether you should take a calcium or vitamin D supplement to lower your risk for osteoporosis.  Menopause may have certain physical symptoms and risks.  Hormone replacement therapy may reduce some of these symptoms and risks. Talk to your health care provider about whether  hormone replacement therapy is right for you. Follow these instructions at home:  Schedule regular health, dental, and eye exams.  Stay current with your immunizations.  Do not use any tobacco products including cigarettes, chewing tobacco, or electronic cigarettes.  If you are pregnant, do not drink alcohol.  If you are breastfeeding, limit how much and how often you drink alcohol.  Limit alcohol intake to no more than 1 drink per day for nonpregnant women. One drink equals 12 ounces of beer, 5 ounces of wine, or 1 ounces of hard liquor.  Do not use street drugs.  Do not share needles.  Ask your health care provider for help if you need support or information about quitting drugs.  Tell your health care provider if you often feel depressed.  Tell your health care provider if you have ever been abused or do not feel safe at home. This information is not intended to replace advice given to you by your health care provider. Make  sure you discuss any questions you have with your health care provider. Document Released: 07/18/2010 Document Revised: 06/10/2015 Document Reviewed: 10/06/2014 Elsevier Interactive Patient Education  2017 [place].  Hypotension As your heart beats, it forces blood through your body. This force is called blood pressure. If you have hypotension, you have low blood pressure. When your blood pressure is too low, you may not get enough blood to your brain. You may feel weak, feel light-headed, have a fast heartbeat, or even pass out (faint). Follow these instructions at home: Eating and drinking   Drink enough fluids to keep your pee (urine) clear or pale yellow.  Eat a healthy diet, and follow instructions from your doctor about eating or drinking restrictions. A healthy diet includes:  Fresh fruits and vegetables.  Whole grains.  Low-fat (lean) meats.  Low-fat dairy products.  Eat extra salt only as told. Do not add extra salt to your diet  unless your doctor tells you to.  Eat small meals often.  Avoid standing up quickly after you eat. Medicines   Take over-the-counter and prescription medicines only as told by your doctor.  Follow instructions from your doctor about changing how much you take (the dosage) of your medicines, if this applies.  Do not stop or change your medicine on your own. General instructions   Wear compression stockings as told by your doctor.  Get up slowly from lying down or sitting.  Avoid hot showers and a lot of heat as told by your doctor.  Return to your normal activities as told by your doctor. Ask what activities are safe for you.  Do not use any products that contain nicotine or tobacco, such as cigarettes and e-cigarettes. If you need help quitting, ask your doctor.  Keep all follow-up visits as told by your doctor. This is important. Contact a doctor if:  You throw up (vomit).  You have watery poop (diarrhea).  You have a fever for more than 2-3 days.  You feel more thirsty than normal.  You feel weak and tired. Get help right away if:  You have chest pain.  You have a fast or irregular heartbeat.  You lose feeling (get numbness) in any part of your body.  You cannot move your arms or your legs.  You have trouble talking.  You get sweaty or feel light-headed.  You faint.  You have trouble breathing.  You have trouble staying awake.  You feel confused. This information is not intended to replace advice given to you by your health care provider. Make sure you discuss any questions you have with your health care provider. Document Released: 03/29/2009 Document Revised: 09/21/2015 Document Reviewed: 09/21/2015 Elsevier Interactive Patient Education  2017 [employer].

## 2016-04-14 ENCOUNTER — Other Ambulatory Visit: Payer: Self-pay | Admitting: Family Medicine

## 2016-04-14 LAB — BASIC METABOLIC PANEL
BUN / CREAT RATIO: 18 (ref 12–28)
BUN: 15 mg/dL (ref 8–27)
CALCIUM: 9.6 mg/dL (ref 8.7–10.3)
CHLORIDE: 100 mmol/L (ref 96–106)
CO2: 21 mmol/L (ref 18–29)
CREATININE: 0.84 mg/dL (ref 0.57–1.00)
GFR calc Af Amer: 83 mL/min/{1.73_m2} (ref >59)
GFR calc non Af Amer: 72 mL/min/{1.73_m2} (ref >59)
GLUCOSE: 78 mg/dL (ref 65–99)
Potassium: 4 mmol/L (ref 3.5–5.2)
Sodium: 139 mmol/L (ref 134–144)

## 2016-04-20 NOTE — Progress Notes (Signed)
Subjective:    Patient ID: [name] , female   DOB: Jan 03, 1951 , 66 y.o..   MRN: [id number]  HPI  [name] is here for  Chief Complaint  Patient presents with  . Follow-up    1. Blood Pressure Blood pressure at home: Checks weekly, usually 120's/80's at home per her husband Exercise: Walks about 2-4 miles a day with her husband Low salt diet: follows a low salt diet ROS: Denies headache, dizziness, visual changes, nausea, vomiting, chest pain, abdominal pain or shortness of breath. BP Readings from Last 3 Encounters:  04/21/16 100/80  04/13/16 (!) 82/56  04/13/16 (!) 98/58   2. GERD: Well controlled with medication. Denying any symptoms at this time  3. CHF, systolic: Sees Dr. Shirlee Latch. Notes that she was instructed to continue lasix, coreg, and entresto. Reports her last echo showed improvement in her heart. She denies any problems with  her medications. Denies any chest pain, shortness of breath, edema.   Review of Systems: Per HPI. All other systems reviewed and are negative.  Past Medical History: Patient Active Problem List   Diagnosis Date Noted  . Elevated alkaline phosphatase measurement 04/24/2016  . Left acute serous otitis media 04/13/2016  . Colonoscopy refused 09/20/2015  . Left knee pain 02/15/2015  . Allergic rhinitis 11/09/2014  . Cardiac LV ejection fraction 21-40% 12/03/2013  . Cerebral infarction due to embolism of cerebral artery (HCC) 12/03/2013  . Chronic systolic congestive heart failure, NYHA class 2 (HCC) 11/05/2013  . GERD (gastroesophageal reflux disease)   . HLD (hyperlipidemia)   . Chronic hypotension 09/25/2013  . Non-ischemic cardiomyopathy (HCC) 08/11/2013  . Mitral regurgitation   . Acute systolic CHF (congestive heart failure), NYHA class 4 (HCC) 08/08/2013  . Left bundle branch block 08/07/2013  . Preventative health care 04/09/2013   Medications: reviewed and updated Current Outpatient Prescriptions  Medication Sig  Dispense Refill  . atorvastatin (LIPITOR) 80 MG tablet Take 1 tablet (80 mg total) by mouth daily at 6 PM. 90 tablet 3  . carvedilol (COREG) 6.25 MG tablet TAKE 1 TABLET TWICE DAILY WITH MEALS 180 tablet 3  . clopidogrel (PLAVIX) 75 MG tablet Take 1 tablet (75 mg total) by mouth daily. 90 tablet 3  . fluticasone (FLONASE) 50 MCG/ACT nasal spray USE 2 SPRAYS IN EACH NOSTRIL EVERY DAY 16 g 0  . furosemide (LASIX) 40 MG tablet TAKE 1 TABLET (40 MG TOTAL) BY MOUTH DAILY. 90 tablet 1  . potassium chloride SA (K-DUR,KLOR-CON) 20 MEQ tablet TAKE 1 TABLET (20 MEQ TOTAL) BY MOUTH DAILY. 90 tablet 3  . ranitidine (ZANTAC) 150 MG tablet Take 1 tablet (150 mg total) by mouth 2 (two) times daily. 60 tablet 3  . sacubitril-valsartan (ENTRESTO) 24-26 MG Take 1 tablet by mouth 2 (two) times daily. 180 tablet 3   No current facility-administered medications for this visit.     Social Hx:  reports that she has never smoked. She has never used smokeless tobacco.   Objective:   BP 100/80   Pulse 89   Temp 98.3 F (36.8 C) (Oral)   Ht 5\' 5"  (1.651 m)   Wt 172 lb 9.6 oz (78.3 kg)   SpO2 96%   BMI 28.72 kg/m  Physical Exam  Gen: NAD, alert, cooperative with exam, well-appearing HEENT: NCAT, PERRL, clear conjunctiva, oropharynx clear, supple neck Cardiac: Regular rate and rhythm, normal S1/S2, no murmur, no edema, capillary refill brisk  Respiratory: Clear to auscultation bilaterally, no wheezes, non-labored  breathing Gastrointestinal: soft, non tender, non distended, bowel sounds present Skin: no rashes, normal turgor  Neurological: Alert and oriented, moves all extremities spontaneously Psych: normal mood and affect  Assessment & Plan:  Chronic hypotension Stable. BP 100/80 today. Asymptomatic. BP able to tolerate her heart failure medications - Continue to check BP at home  - Will let me know if her BP drops below 90/60 - Follow up in 1-3 months  GERD (gastroesophageal reflux disease) Stable.  Symptoms controlled with zantac - Refill zantac  Chronic systolic congestive heart failure, NYHA class 2 Echo in 8/16 with EF up to 35-40%. Follows with cardiology. Doing well on current regimen. No chest pain, dyspnea, or edema on exam.  - Continue lasix, coreg, and entresto  Elevated alkaline phosphatase measurement ALP 204 in January 2018 per Epic review. Cardiologist advised to get a RUQ u/s, patient has not had this yet. Patient not having any concerning abdominal complaints or exam findings.  -Will have patient return for repeat blood work, fasting, to check ALP and GGT for further evaluation    Anders Simmonds, MD [facility], PGY-2

## 2016-04-21 ENCOUNTER — Encounter: Payer: Self-pay | Admitting: Family Medicine

## 2016-04-21 ENCOUNTER — Ambulatory Visit (INDEPENDENT_AMBULATORY_CARE_PROVIDER_SITE_OTHER): Payer: Medicare HMO | Admitting: Family Medicine

## 2016-04-21 DIAGNOSIS — K219 Gastro-esophageal reflux disease without esophagitis: Secondary | ICD-10-CM

## 2016-04-21 DIAGNOSIS — R748 Abnormal levels of other serum enzymes: Secondary | ICD-10-CM | POA: Diagnosis not present

## 2016-04-21 DIAGNOSIS — I9589 Other hypotension: Secondary | ICD-10-CM

## 2016-04-21 DIAGNOSIS — I5022 Chronic systolic (congestive) heart failure: Secondary | ICD-10-CM | POA: Diagnosis not present

## 2016-04-21 NOTE — Patient Instructions (Signed)
Thank you for coming in today, it was so nice to see you!   Please follow up in 3-6 months. You can schedule this appointment at the front desk before you leave or call the clinic.  Bring in all your medications or supplements to each appointment for review.   If you have any questions or concerns, please do not hesitate to call the office at [phone]. You can also message me directly via MyChart.   Sincerely,  Anders Simmonds, MD

## 2016-04-24 DIAGNOSIS — R748 Abnormal levels of other serum enzymes: Secondary | ICD-10-CM | POA: Insufficient documentation

## 2016-04-24 NOTE — Assessment & Plan Note (Addendum)
Stable. BP 100/80 today. Asymptomatic. BP able to tolerate her heart failure medications - Continue to check BP at home  - Will let me know if her BP drops below 90/60 - Follow up in 1-3 months

## 2016-04-24 NOTE — Assessment & Plan Note (Signed)
Stable. Symptoms controlled with zantac - Refill zantac

## 2016-04-24 NOTE — Assessment & Plan Note (Addendum)
ALP 204 in January 2018 per Epic review. Cardiologist advised to get a RUQ u/s, patient has not had this yet. Patient not having any concerning abdominal complaints or exam findings.  -Will have patient return for repeat blood work, fasting, to check ALP and GGT for further evaluation

## 2016-04-24 NOTE — Assessment & Plan Note (Signed)
Echo in 8/16 with EF up to 35-40%. Follows with cardiology. Doing well on current regimen. No chest pain, dyspnea, or edema on exam.  - Continue lasix, coreg, and entresto

## 2016-04-27 ENCOUNTER — Ambulatory Visit (HOSPITAL_COMMUNITY)
Admission: RE | Admit: 2016-04-27 | Discharge: 2016-04-27 | Disposition: A | Discharge status: Home / Self Care | Payer: Medicare HMO | Source: Ambulatory Visit | Attending: Cardiology | Admitting: Cardiology

## 2016-04-27 VITALS — BP 120/80 | HR 62 | Wt 171.0 lb

## 2016-04-27 DIAGNOSIS — Z79899 Other long term (current) drug therapy: Secondary | ICD-10-CM | POA: Diagnosis not present

## 2016-04-27 DIAGNOSIS — I428 Other cardiomyopathies: Secondary | ICD-10-CM | POA: Insufficient documentation

## 2016-04-27 DIAGNOSIS — Z7951 Long term (current) use of inhaled steroids: Secondary | ICD-10-CM | POA: Insufficient documentation

## 2016-04-27 DIAGNOSIS — R7989 Other specified abnormal findings of blood chemistry: Secondary | ICD-10-CM | POA: Insufficient documentation

## 2016-04-27 DIAGNOSIS — Z95810 Presence of automatic (implantable) cardiac defibrillator: Secondary | ICD-10-CM | POA: Diagnosis not present

## 2016-04-27 DIAGNOSIS — Z8673 Personal history of transient ischemic attack (TIA), and cerebral infarction without residual deficits: Secondary | ICD-10-CM | POA: Diagnosis not present

## 2016-04-27 DIAGNOSIS — Z7902 Long term (current) use of antithrombotics/antiplatelets: Secondary | ICD-10-CM | POA: Insufficient documentation

## 2016-04-27 DIAGNOSIS — E7849 Other hyperlipidemia: Secondary | ICD-10-CM

## 2016-04-27 DIAGNOSIS — R748 Abnormal levels of other serum enzymes: Secondary | ICD-10-CM | POA: Diagnosis not present

## 2016-04-27 DIAGNOSIS — K8020 Calculus of gallbladder without cholecystitis without obstruction: Secondary | ICD-10-CM | POA: Insufficient documentation

## 2016-04-27 DIAGNOSIS — Z8249 Family history of ischemic heart disease and other diseases of the circulatory system: Secondary | ICD-10-CM | POA: Insufficient documentation

## 2016-04-27 DIAGNOSIS — E784 Other hyperlipidemia: Secondary | ICD-10-CM | POA: Diagnosis not present

## 2016-04-27 DIAGNOSIS — I5022 Chronic systolic (congestive) heart failure: Secondary | ICD-10-CM

## 2016-04-27 DIAGNOSIS — E785 Hyperlipidemia, unspecified: Secondary | ICD-10-CM | POA: Insufficient documentation

## 2016-04-27 LAB — COMPREHENSIVE METABOLIC PANEL
ALBUMIN: 4.2 g/dL (ref 3.5–5.0)
ALT: 46 U/L (ref 14–54)
AST: 48 U/L — AB (ref 15–41)
Alkaline Phosphatase: 147 U/L — ABNORMAL HIGH (ref 38–126)
Anion gap: 10 (ref 5–15)
BUN: 15 mg/dL (ref 6–20)
CHLORIDE: 106 mmol/L (ref 101–111)
CO2: 24 mmol/L (ref 22–32)
CREATININE: 0.8 mg/dL (ref 0.44–1.00)
Calcium: 9.1 mg/dL (ref 8.9–10.3)
GFR calc Af Amer: >60 mL/min (ref >60)
GLUCOSE: 84 mg/dL (ref 65–99)
Potassium: 3.4 mmol/L — ABNORMAL LOW (ref 3.5–5.1)
SODIUM: 140 mmol/L (ref 135–145)
Total Bilirubin: 0.9 mg/dL (ref 0.3–1.2)
Total Protein: 7.3 g/dL (ref 6.5–8.1)

## 2016-04-27 NOTE — Patient Instructions (Signed)
Labs today We will only contact you if something comes back abnormal or we need to make some changes. Otherwise no news is good news!  Your physician recommends that you schedule a follow-up appointment in: 6 months with Dr McLean   Do the following things EVERYDAY: 1) Weigh yourself in the morning before breakfast. Write it down and keep it in a log. 2) Take your medicines as prescribed 3) Eat low salt foods-Limit salt (sodium) to 2000 mg per day.  4) Stay as active as you can everyday 5) Limit all fluids for the day to less than 2 liters   

## 2016-04-29 NOTE — Progress Notes (Signed)
Patient ID: [name], female   DOB: 1950/04/15, 66 y.o.   MRN: [id number] PCP: Dr Jonathon Jordan EP: Dr Graciela Husbands.  Cardiologist: Dr Shirlee Latch  Neurologist: Dr Pearlean Brownie  HPI: [name] is a 66 y.o. female with chronic systolic CHF with nonischemic cardiomypathy (no significant CAD) s/p Medtronic CRT-D, LBBB, mild-mod MR and CVA (09/2013).  Coronary angiography in 7/15 showed no significant CAD.  Admitted in 9/15 with right sided facial droop and expressive aphasia, CVA on MRI. Had TEE showing EF 15%.    QRS narrowed, so in 11/16 device was reprogrammed to limit V-pacing.   Echo in 12/17 showed EF up to 45-50%.    Patient seems to be doing well.  No exertional dyspnea or chest pain.  She has been applying for jobs.  No tachypalpitations.  No CVA-like symptoms.  LFTs elevated in 1/18, abdominal US with gallstones (?cholestatic picture on LFTs was due to stone that passed).    PMH: 1. Nonischemic cardiomyopathy: Echo (7/15) with severe LV dilation, EF 25% with diffuse hypokinesis, mild to moderate MR.  RHC/ LHC 08/08/13 with RA 4, PA 23/8, PCWP 11, CI 2.52; no CAD.  TEE (9/15) with EF 15-20%, severe global hypokinesis, no LV thrombus, moderate MR, normal RV.  Echo (10/2013): EF 25-30%, grade I DD, mild AI. Medtronic CRT-D (10/15).  Echo (2/16) with EF 25-30%, diffuse hypokinesis worse in the septum, grade I diastolic dysfunction, normal RV size and systolic function, mild-moderate MR.  - Echo (8/16) with EF 35-40%, diffuse hypokinesis, mild-moderate mitral regurgitation.  - Echo (12/17): EF 45-50%, mild LVH, mild to moderate MR.  2. CVA: 9/15 with dysarthria.  Carotids 9/15 with no significant CAD.  - Carotid dopplers (10/17): mild plaque, normal subclavians.  3. LBBB: Medtronic CRT-D (10/2013) 4. Hyperlipidemia 5. Cholelithiasis  Labs 09/23/13 K 5.1 Creatinine 0.68 Cholesterol 166 TGl 83 HDL 41, LDL 108 10/07/13: K 3.9, creatinine 0.65, LDL 108 11/03/13: K 3.8, creatinine 0.8 11/20/13: K 3.9 Creatinine  0.66  1/16: K 3.6, creatinine 0.77 04/08/2014: K 4.2 Creatinine 0.73 5/16: K 4, creatinine 0.72 8/16: LDL 79, HDL 44 9/16: K 3.8, creatinine 0.76 1/17: Hgb 12.5 11/17: K 3.7, creatinine 0.71, LDL 90 1/18: LDL 77, HDl 40, AST normal, ALT 56, alkaline phosphatase 204  SH: lives with her son and husband. Does not drink or smoke. Currently not working.   FH: Mom had A fib and Heart Failure. Deceased 75        Sister has lupus  ROS: All systems negative except as listed in HPI, PMH and Problem List.   Current Outpatient Prescriptions  Medication Sig Dispense Refill  . atorvastatin (LIPITOR) 80 MG tablet Take 1 tablet (80 mg total) by mouth daily at 6 PM. 90 tablet 3  . carvedilol (COREG) 6.25 MG tablet TAKE 1 TABLET TWICE DAILY WITH MEALS 180 tablet 3  . clopidogrel (PLAVIX) 75 MG tablet Take 1 tablet (75 mg total) by mouth daily. 90 tablet 3  . fluticasone (FLONASE) 50 MCG/ACT nasal spray USE 2 SPRAYS IN EACH NOSTRIL EVERY DAY 16 g 0  . furosemide (LASIX) 40 MG tablet TAKE 1 TABLET (40 MG TOTAL) BY MOUTH DAILY. 90 tablet 1  . potassium chloride SA (K-DUR,KLOR-CON) 20 MEQ tablet TAKE 1 TABLET (20 MEQ TOTAL) BY MOUTH DAILY. 90 tablet 3  . ranitidine (ZANTAC) 150 MG tablet Take 1 tablet (150 mg total) by mouth 2 (two) times daily. 60 tablet 3  . sacubitril-valsartan (ENTRESTO) 24-26 MG Take 1 tablet by mouth 2 (  two) times daily. 180 tablet 3   No current facility-administered medications for this encounter.     Vitals:   04/27/16 1511  BP: 120/80  Pulse: 62  SpO2: 100%  Weight: 171 lb (77.6 kg)    PHYSICAL EXAM: General:  NAD.  HEENT: normal Neck: supple.  No JVD. Carotids 2+ bilaterally; no bruits. No subclavian bruit appreciated.  No lymphadenopathy or thryomegaly appreciated. Cor: PMI normal. Regular rate & rhythm. No rubs, gallops or murmurs. Lungs: clear to auscultation bilaterally.  Abdomen: soft, nontender, nondistended. No hepatosplenomegaly. No bruits or masses. Good  bowel sounds. Extremities: no cyanosis, clubbing, rash, edema Neuro: alert & orientedx3, cranial nerves grossly intact. Moves all 4 extremities w/o difficulty. Affect pleasant. Difficult word finding.   ASSESSMENT & PLAN:  1. Chronic Systolic Heart Failure: Nonischemic cardiomyopathy s/p CRT-D in 10/15, coronary angiography without CAD in 07/2013. Echo done in 2/16, EF 25-30%.  Repeat echo in 8/16 with EF up to 35-40%.  Finally, echo in 12/17 showed EF up to 45-50%.  NYHA class II. Euvolemic by exam. - Continue Lasix 40 mg daily.  - Continue Coreg 6.25 mg bid.  - Continue Entresto 24/26 bid.    - QRS now narrowed, device has been reprogrammed to limit v-pacing. 2. CVA: ?Embolic.  No atrial fibrillation has been detected so far on device.  She will continue Plavix for now. If future device interrogation shows atrial fibrillation, she will need to be anticoagulated.   3. Hyperlipidemia: Goal LDL< 70 with h/o CVA.  LDL near goal in 1/18.  4. Elevated LFTs: Mild elevation in 1/18, cholestatic pattern.  Patient has cholelithiasis, may have passed a stone with transient rise in LFTs.  Repeat CMET today.   Followup in 6 months.   [name] 04/29/2016

## 2016-05-14 ENCOUNTER — Other Ambulatory Visit: Payer: Self-pay | Admitting: Cardiology

## 2016-05-17 ENCOUNTER — Other Ambulatory Visit: Payer: Self-pay | Admitting: Family Medicine

## 2016-05-17 ENCOUNTER — Telehealth: Payer: Self-pay | Admitting: Family Medicine

## 2016-05-17 ENCOUNTER — Telehealth (HOSPITAL_COMMUNITY): Payer: Self-pay | Admitting: Vascular Surgery

## 2016-05-17 ENCOUNTER — Other Ambulatory Visit (HOSPITAL_COMMUNITY): Payer: Self-pay | Admitting: Pharmacist

## 2016-05-17 MED ORDER — SACUBITRIL-VALSARTAN 24-26 MG PO TABS: 1.0000 | ORAL_TABLET | Freq: Two times a day (BID) | ORAL | 3 refills | Status: DC

## 2016-05-17 NOTE — Telephone Encounter (Signed)
Ms. Fudge called stating her Sherryll Burger required a PA. I have already submitted the PA and it is approved through [facility] Part D through 10/29/17. I called [facility] who stated that the reason it wasn't going through her insurance was because [employer] had already filled and shipped it to her. I called and left her a VM about this.   Tyler Deis. Bonnye Fava, PharmD, BCPS, CPP Clinical Pharmacist Pager: [phone] Phone: [phone] 05/17/2016 3:05 PM

## 2016-05-17 NOTE — Telephone Encounter (Signed)
Pt called because is NO longer using [employer] by mail pharmacy. She transferred to [facility] at [facility], She is having problems getting her prescriptions transferred to them, so she would like for the doctor to send new prescriptions to [facility] for ALL of her medications. jw

## 2016-05-17 NOTE — Telephone Encounter (Signed)
Pt called she need prior auth for [employer] please advise

## 2016-05-18 MED ORDER — CLOPIDOGREL BISULFATE 75 MG PO TABS: 75.0000 mg | ORAL_TABLET | Freq: Every day | ORAL | 3 refills | Status: DC

## 2016-05-18 MED ORDER — RANITIDINE HCL 150 MG PO TABS: ORAL_TABLET | ORAL | 3 refills | Status: DC

## 2016-05-18 MED ORDER — SACUBITRIL-VALSARTAN 24-26 MG PO TABS: 1.0000 | ORAL_TABLET | Freq: Two times a day (BID) | ORAL | 3 refills | Status: DC

## 2016-05-18 MED ORDER — CARVEDILOL 6.25 MG PO TABS: 6.2500 mg | ORAL_TABLET | Freq: Two times a day (BID) | ORAL | 3 refills | Status: DC

## 2016-05-18 MED ORDER — POTASSIUM CHLORIDE CRYS ER 20 MEQ PO TBCR: 20.0000 meq | EXTENDED_RELEASE_TABLET | Freq: Every day | ORAL | 3 refills | Status: DC

## 2016-05-18 MED ORDER — ATORVASTATIN CALCIUM 80 MG PO TABS: 80.0000 mg | ORAL_TABLET | Freq: Every day | ORAL | 3 refills | Status: DC

## 2016-05-18 MED ORDER — FLUTICASONE PROPIONATE 50 MCG/ACT NA SUSP: 2.0000 | Freq: Every day | NASAL | 0 refills | Status: DC

## 2016-05-18 MED ORDER — FUROSEMIDE 40 MG PO TABS: 40.0000 mg | ORAL_TABLET | Freq: Every day | ORAL | 1 refills | Status: DC

## 2016-05-18 NOTE — Telephone Encounter (Signed)
Refilled all medications to [facility]

## 2016-05-22 ENCOUNTER — Ambulatory Visit: Payer: Self-pay

## 2016-05-26 ENCOUNTER — Encounter: Payer: Self-pay | Admitting: Family Medicine

## 2016-05-26 DIAGNOSIS — I509 Heart failure, unspecified: Secondary | ICD-10-CM

## 2016-05-31 ENCOUNTER — Telehealth (HOSPITAL_COMMUNITY): Payer: Self-pay | Admitting: Pharmacist

## 2016-05-31 NOTE — Telephone Encounter (Signed)
Entresto 24-26 mg BID PA approved by [facility] through 10/29/17.   Tyler Deis. Bonnye Fava, PharmD, BCPS, CPP Clinical Pharmacist Pager: [phone] Phone: [phone] 05/31/2016 4:42 PM

## 2016-05-31 NOTE — Progress Notes (Signed)
addendum: I have reviewed this visit and discussed with Lauren Ducatte, RN, BSN, and agree with her documentation  Christina Gambino, MD [facility], PGY-2  

## 2016-07-06 ENCOUNTER — Telehealth: Payer: Self-pay | Admitting: Cardiology

## 2016-07-06 ENCOUNTER — Ambulatory Visit (INDEPENDENT_AMBULATORY_CARE_PROVIDER_SITE_OTHER): Payer: Medicare HMO | Admitting: *Deleted

## 2016-07-06 DIAGNOSIS — I428 Other cardiomyopathies: Secondary | ICD-10-CM

## 2016-07-06 NOTE — Telephone Encounter (Signed)
Confirmed remote transmission w/ pt husband.   

## 2016-07-07 ENCOUNTER — Encounter: Payer: Self-pay | Admitting: Cardiology

## 2016-07-07 NOTE — Progress Notes (Signed)
Remote ICD transmission.   

## 2016-07-10 ENCOUNTER — Telehealth: Payer: Self-pay | Admitting: *Deleted

## 2016-07-10 NOTE — Telephone Encounter (Signed)
Prior Authorization received from [facility] for [employer] 24-26 mg. Per patient's insurance there is quantity limit of 60 tablets per 30 days. PA form placed in provider box for review. Clovis Pu, RN

## 2016-07-10 NOTE — Telephone Encounter (Signed)
I do not prescribe Entresto to [name]. I believe the prior authorization will need to be sent to her cardiologist's office [name], MD) who prescribes this medication. Let me know if there's anything I need to do so the prior authorization can be sent there. Thank you.   Anders Simmonds, MD [facility], PGY-2

## 2016-07-11 NOTE — Telephone Encounter (Signed)
PA form faxed back to pharmacy to send to patient's cardiologist.  Clovis Pu, RN

## 2016-07-19 LAB — CUP PACEART REMOTE DEVICE CHECK
Brady Statistic AS VP Percent: 0 %
Brady Statistic AS VS Percent: 100 %
Brady Statistic RA Percent Paced: 0 %
Date Time Interrogation Session: 20180621170428
HighPow Impedance: 73 Ohm
Implantable Lead Implant Date: 20151021
Implantable Lead Implant Date: 20151021
Implantable Lead Location: 753858
Implantable Lead Location: 753860
Implantable Lead Model: 5076
Implantable Pulse Generator Implant Date: 20151021
Lead Channel Impedance Value: 456 Ohm
Lead Channel Impedance Value: 475 Ohm
Lead Channel Impedance Value: 532 Ohm
Lead Channel Impedance Value: 608 Ohm
Lead Channel Impedance Value: 836 Ohm
Lead Channel Impedance Value: 874 Ohm
Lead Channel Impedance Value: 893 Ohm
Lead Channel Impedance Value: 931 Ohm
Lead Channel Impedance Value: 950 Ohm
Lead Channel Pacing Threshold Amplitude: 0.375 V
Lead Channel Pacing Threshold Amplitude: 1.125 V
Lead Channel Pacing Threshold Pulse Width: 0.4 ms
Lead Channel Pacing Threshold Pulse Width: 0.5 ms
Lead Channel Sensing Intrinsic Amplitude: 3.125 mV
Lead Channel Sensing Intrinsic Amplitude: 9.5 mV
Lead Channel Setting Pacing Pulse Width: 0.4 ms
Lead Channel Setting Sensing Sensitivity: 0.3 mV
MDC IDC LEAD IMPLANT DT: 20151021
MDC IDC LEAD LOCATION: 753859
MDC IDC MSMT BATTERY REMAINING LONGEVITY: 74 mo
MDC IDC MSMT BATTERY VOLTAGE: 2.98 V
MDC IDC MSMT LEADCHNL LV IMPEDANCE VALUE: 475 Ohm
MDC IDC MSMT LEADCHNL LV IMPEDANCE VALUE: 589 Ohm
MDC IDC MSMT LEADCHNL LV IMPEDANCE VALUE: 665 Ohm
MDC IDC MSMT LEADCHNL RA IMPEDANCE VALUE: 589 Ohm
MDC IDC MSMT LEADCHNL RA SENSING INTR AMPL: 3.125 mV
MDC IDC MSMT LEADCHNL RV PACING THRESHOLD AMPLITUDE: 0.625 V
MDC IDC MSMT LEADCHNL RV PACING THRESHOLD PULSEWIDTH: 0.4 ms
MDC IDC MSMT LEADCHNL RV SENSING INTR AMPL: 9.5 mV
MDC IDC SET LEADCHNL RV PACING AMPLITUDE: 2 V
MDC IDC STAT BRADY AP VP PERCENT: 0 %
MDC IDC STAT BRADY AP VS PERCENT: 0 %
MDC IDC STAT BRADY RV PERCENT PACED: 0.01 %

## 2016-08-09 ENCOUNTER — Encounter: Payer: Self-pay | Admitting: Family Medicine

## 2016-08-09 ENCOUNTER — Ambulatory Visit (INDEPENDENT_AMBULATORY_CARE_PROVIDER_SITE_OTHER): Payer: Medicare HMO | Admitting: Family Medicine

## 2016-08-09 VITALS — BP 110/60 | HR 82 | Temp 98.2°F | Ht 65.0 in | Wt 175.0 lb

## 2016-08-09 DIAGNOSIS — Z0000 Encounter for general adult medical examination without abnormal findings: Secondary | ICD-10-CM

## 2016-08-09 DIAGNOSIS — Z23 Encounter for immunization: Secondary | ICD-10-CM

## 2016-08-09 DIAGNOSIS — I9589 Other hypotension: Secondary | ICD-10-CM | POA: Diagnosis not present

## 2016-08-09 MED ORDER — TETANUS-DIPHTH-ACELL PERTUSSIS 5-2.5-18.5 LF-MCG/0.5 IM SUSP: 0.5000 mL | Freq: Once | INTRAMUSCULAR | 0 refills | Status: AC

## 2016-08-09 NOTE — Assessment & Plan Note (Addendum)
Prescription for TDaP vaccine given today. Additionally she received her first pneumococcal vaccine-13

## 2016-08-09 NOTE — Assessment & Plan Note (Signed)
Stable. BP 110/60 today. Asymptomatic. Has been following up with her cardiologist -Continue current medications -Follow-up in 3 months

## 2016-08-09 NOTE — Progress Notes (Signed)
Subjective:    Patient ID: [name] , female   DOB: Jul 08, 1950 , 66 y.o..   MRN: [id number]  HPI  [name] is here for  Chief Complaint  Patient presents with  . Follow-up    1. Blood pressure Blood pressure at home: Does not check usually but husband states that it is below 120 systolically and below 80 diastolically whenever he has checked Exercise: walks about 2-4 miles a day with her husband a couple times a week Low salt diet: Compliant Medications: Compliant with medications: Lasix 40 mg daily, Coreg 6.25 mg bid, Entresto 24/26 bid Side effects: None ROS: Denies headache, dizziness, visual changes, nausea, vomiting, chest pain, abdominal pain or shortness of breath. BP Readings from Last 3 Encounters:  08/09/16 110/60  04/27/16 120/80  04/21/16 100/80   Notes that she recently saw her cardiologist.  Review of Systems: Per HPI.   Past Medical History: Patient Active Problem List   Diagnosis Date Noted  . Elevated alkaline phosphatase measurement 04/24/2016  . Left acute serous otitis media 04/13/2016  . Colonoscopy refused 09/20/2015  . Left knee pain 02/15/2015  . Allergic rhinitis 11/09/2014  . Cardiac LV ejection fraction 21-40% 12/03/2013  . Cerebral infarction due to embolism of cerebral artery (HCC) 12/03/2013  . Chronic systolic congestive heart failure, NYHA class 2 (HCC) 11/05/2013  . GERD (gastroesophageal reflux disease)   . HLD (hyperlipidemia)   . Chronic hypotension 09/25/2013  . Non-ischemic cardiomyopathy (HCC) 08/11/2013  . Mitral regurgitation   . Acute systolic CHF (congestive heart failure), NYHA class 4 (HCC) 08/08/2013  . Left bundle branch block 08/07/2013  . Preventative health care 04/09/2013    Medications: reviewed and updated Current Outpatient Prescriptions  Medication Sig Dispense Refill  . atorvastatin (LIPITOR) 80 MG tablet Take 1 tablet (80 mg total) by mouth daily at 6 PM. 90 tablet 3  . carvedilol (COREG)  6.25 MG tablet Take 1 tablet (6.25 mg total) by mouth 2 (two) times daily with a meal. 180 tablet 3  . clopidogrel (PLAVIX) 75 MG tablet Take 1 tablet (75 mg total) by mouth daily. 90 tablet 3  . fluticasone (FLONASE) 50 MCG/ACT nasal spray Place 2 sprays into both nostrils daily. 16 g 0  . furosemide (LASIX) 40 MG tablet Take 1 tablet (40 mg total) by mouth daily. 90 tablet 1  . potassium chloride SA (K-DUR,KLOR-CON) 20 MEQ tablet Take 1 tablet (20 mEq total) by mouth daily. 90 tablet 3  . ranitidine (ZANTAC) 150 MG tablet Take 1 tablet (150 mg total) by mouth 2 (two) times daily. 60 tablet 3  . sacubitril-valsartan (ENTRESTO) 24-26 MG Take 1 tablet by mouth 2 (two) times daily. 180 tablet 3  . Tdap (BOOSTRIX) 5-2.5-18.5 LF-MCG/0.5 injection Inject 0.5 mLs into the muscle once. 0.5 mL 0   No current facility-administered medications for this visit.     Social Hx:  reports that she has never smoked. She has never used smokeless tobacco.   Objective:   BP 110/60   Pulse 82   Temp 98.2 F (36.8 C) (Oral)   Ht 5\' 5"  (1.651 m)   Wt 175 lb (79.4 kg)   BMI 29.12 kg/m  Physical Exam  Gen: NAD, alert, cooperative with exam, well-appearing Cardiac: Regular rate and rhythm, normal S1/S2, no murmur, no edema, capillary refill brisk  Respiratory: Clear to auscultation bilaterally, no wheezes, non-labored breathing Psych: good insight, normal mood and affect  Assessment & Plan:  Preventative health care  Prescription for TDaP vaccine given today. Additionally she received her first pneumococcal vaccine-13  Chronic hypotension Stable. BP 110/60 today. Asymptomatic. Has been following up with her cardiologist -Continue current medications -Follow-up in 3 months  Orders Placed This Encounter  Procedures  . Pneumococcal conjugate vaccine 13-valent IM   Meds ordered this encounter  Medications  . Tdap (BOOSTRIX) 5-2.5-18.5 LF-MCG/0.5 injection    Sig: Inject 0.5 mLs into the muscle once.      Dispense:  0.5 mL    Refill:  0    Anders Simmonds, MD [facility], PGY-3

## 2016-08-09 NOTE — Patient Instructions (Signed)
Thank you for coming in today, it was so nice to see you! Today we talked about:    Please follow up in 3 month   Bring in all your medications or supplements to each appointment for review.    If you have any questions or concerns, please do not hesitate to call the office at [phone]. You can also message me directly via MyChart.   Sincerely,  Anders Simmonds, MD

## 2016-08-11 ENCOUNTER — Other Ambulatory Visit (HOSPITAL_COMMUNITY): Payer: Self-pay | Admitting: Cardiology

## 2016-08-11 IMAGING — CT CT HEAD W/O CM
2 series · 16 of 30 positions shown, 20 images · non-contrast
Comparison: None.

CLINICAL DATA: Frontal headaches

EXAM:
CT HEAD WITHOUT CONTRAST
TECHNIQUE: Contiguous axial images were obtained from the base of the skull
through the vertex without intravenous contrast.

[Series 201: head w/o, idose (1) · axial · non-contrast · 0.49mm/px · z∈[+123,+243]mm · 13 of 30 slices shown, 17 images]
[im 3/30  brain]
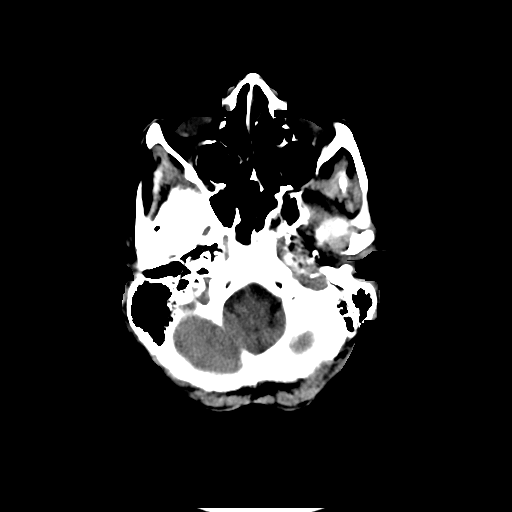
[im 3/30  bone]
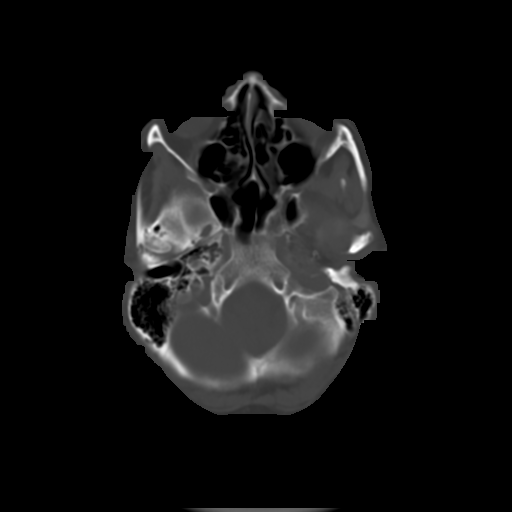
[im 5/30  brain]
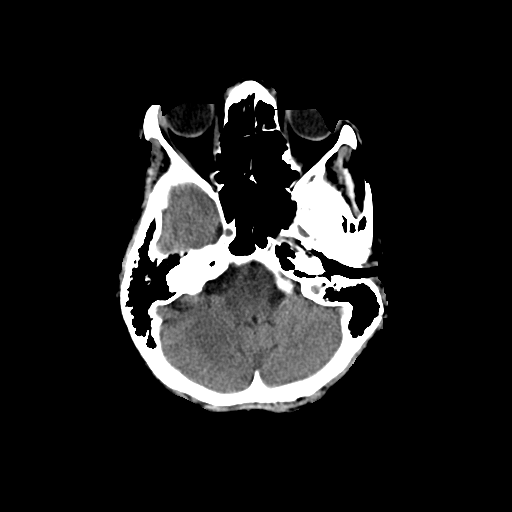
[im 7/30  brain]
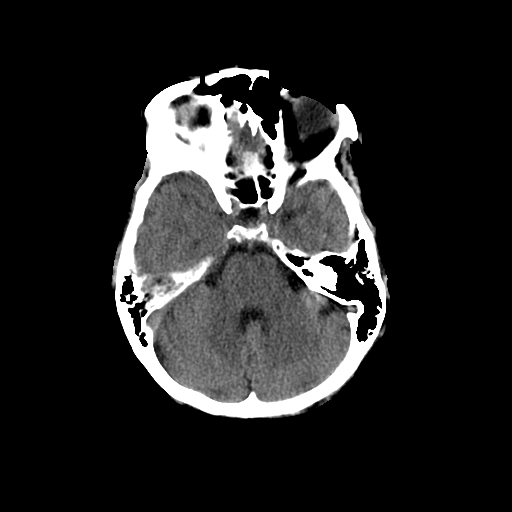
[im 9/30  brain]
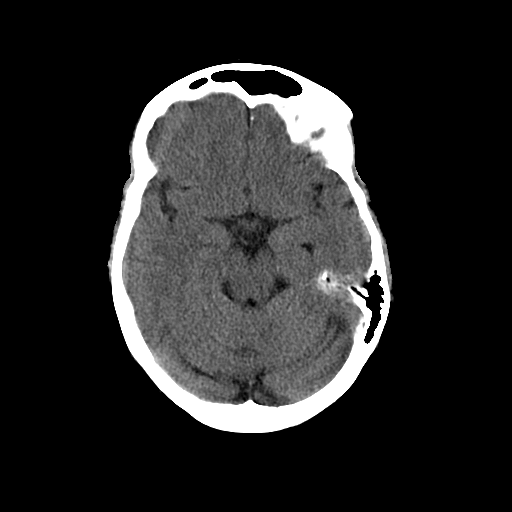
[im 11/30  brain]
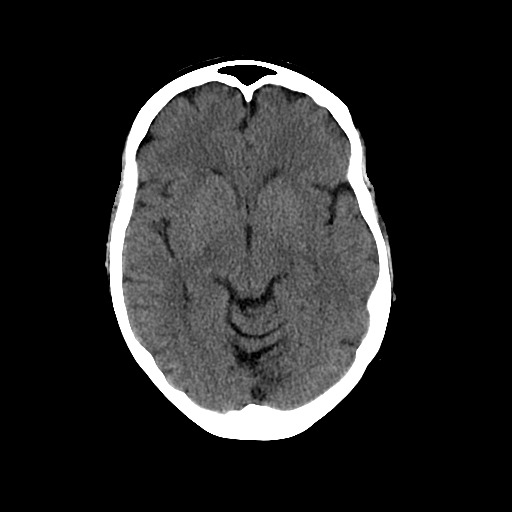
[im 11/30  bone]
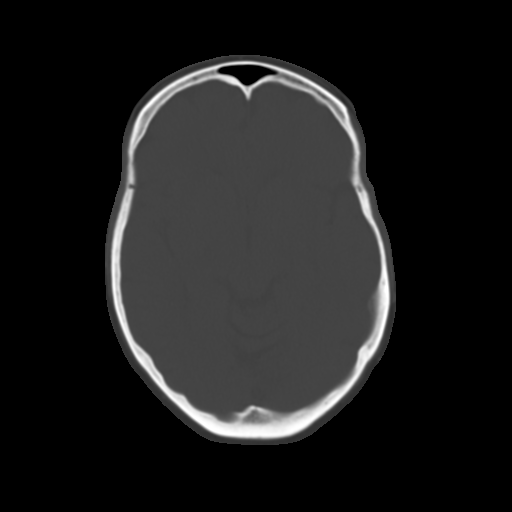
[im 13/30  brain]
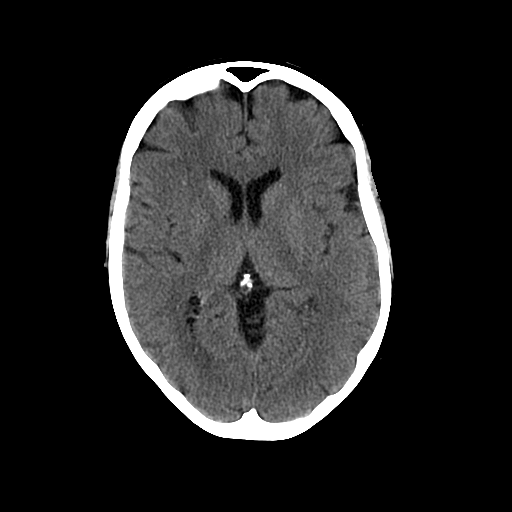
[im 15/30  brain]
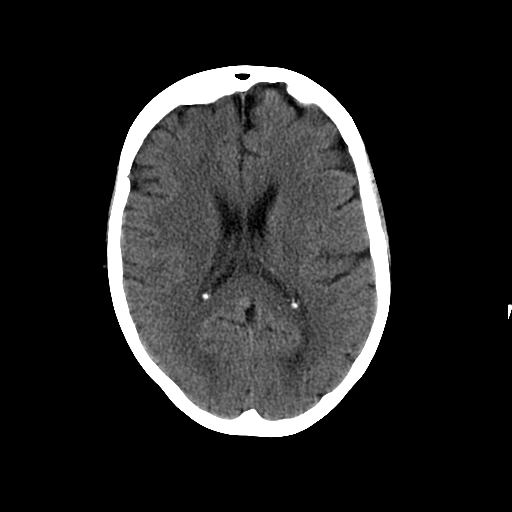
[im 17/30  brain]
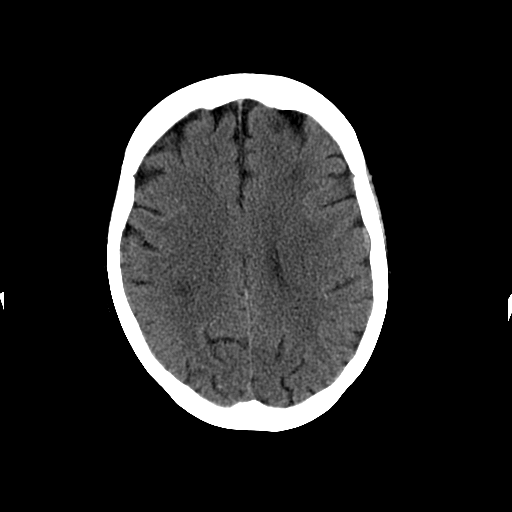
[im 19/30  brain]
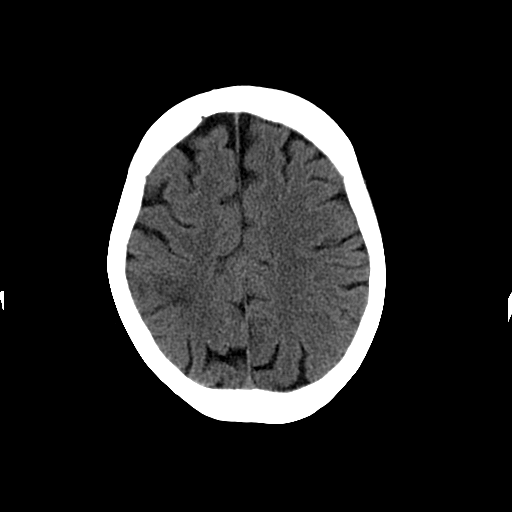
[im 19/30  bone]
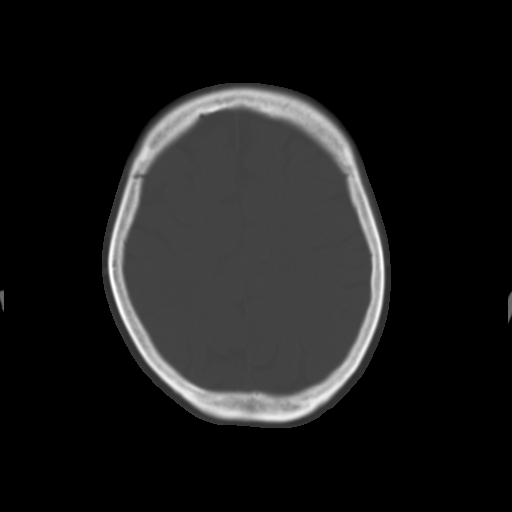
[im 21/30  brain]
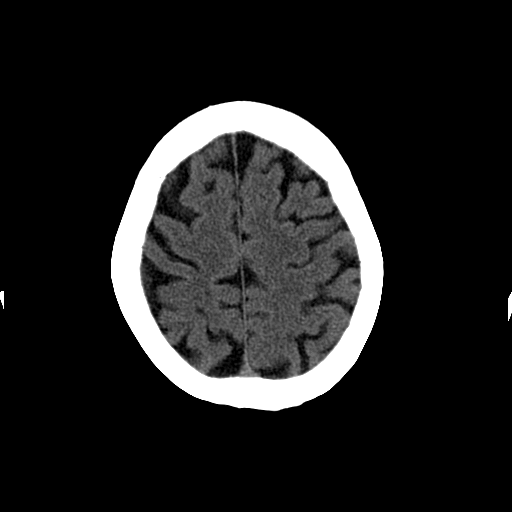
[im 23/30  brain]
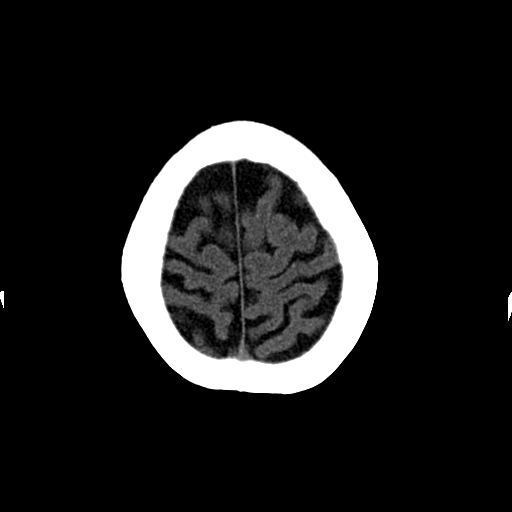
[im 25/30  brain]
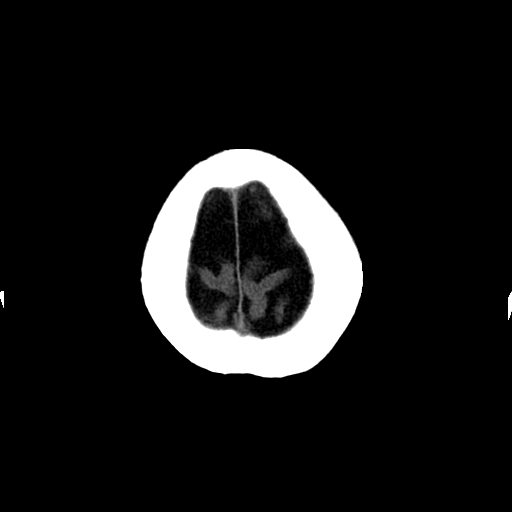
[im 27/30  brain]
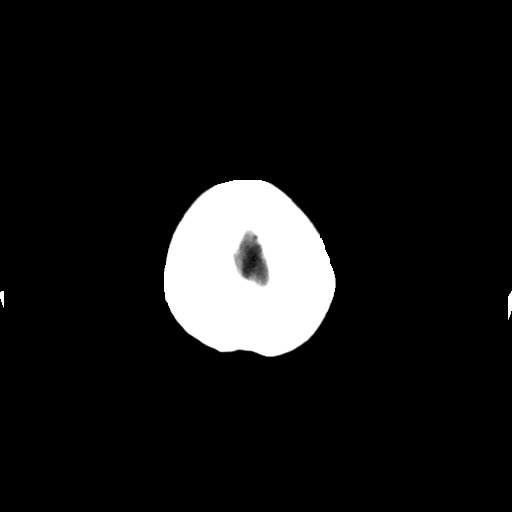
[im 27/30  bone]
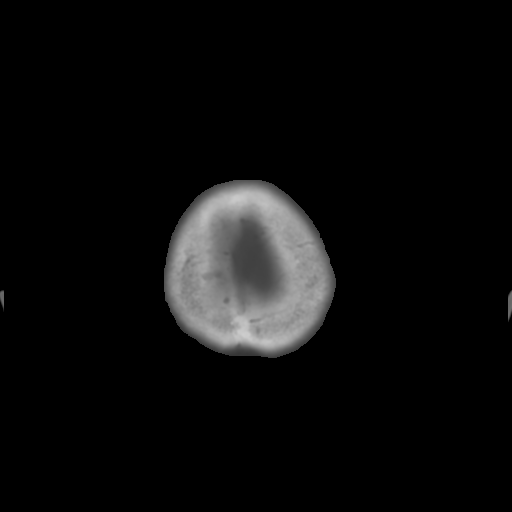

[Series 202: head w/o bone, idose (1) · axial · non-contrast · 0.49mm/px · z∈[+123,+163]mm · 3 of 30 slices shown]
[im 3/30  bone]
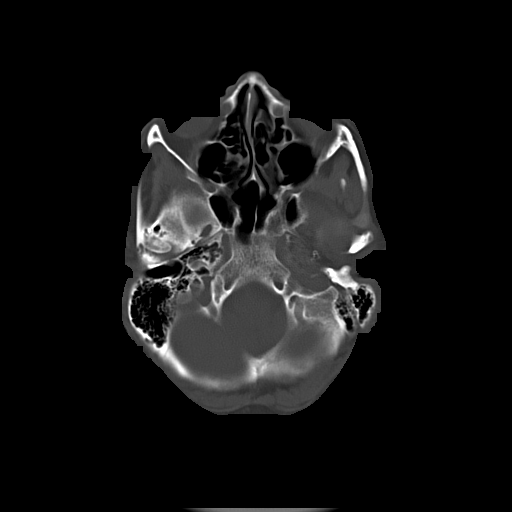
[im 7/30  bone]
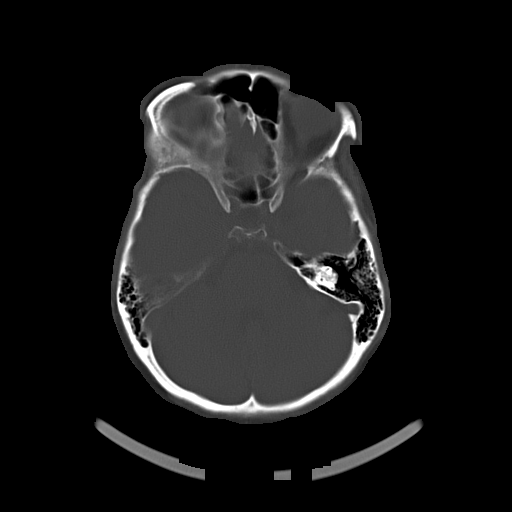
[im 11/30  bone]
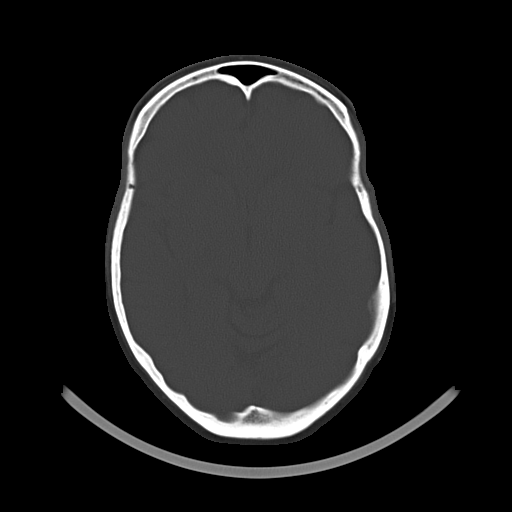

[16 of 30 positions shown; findings below may reference images not displayed]

FINDINGS: The bony calvarium is intact. The ventricles are of normal size and
configuration. No findings to suggest acute hemorrhage, acute
infarction or space-occupying mass lesion are noted.
IMPRESSION: No acute intracranial abnormality.

## 2016-08-11 IMAGING — MR MR HEAD W/O CM
9 of 11 series · 29 of 48 positions shown · non-contrast
Comparison: Prior CT from earlier the same day.

CLINICAL DATA: Right-sided facial droop, dysarthria, expressive
aphasia. Concern for TIA versus stroke.

EXAM:
MRI HEAD WITHOUT CONTRAST
MRA HEAD WITHOUT CONTRAST
TECHNIQUE: Multiplanar, multiecho pulse sequences of the brain and surrounding
structures were obtained without intravenous contrast. Angiographic
images of the head were obtained using MRA technique without
contrast.

[Series 3: T1 · sagittal · 5.0mm · 0.47mm/px · 2 of 23 slices shown]
[im 1/23]
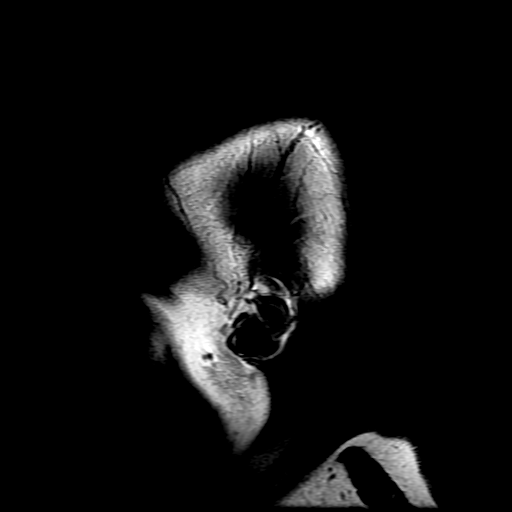
[im 23/23]
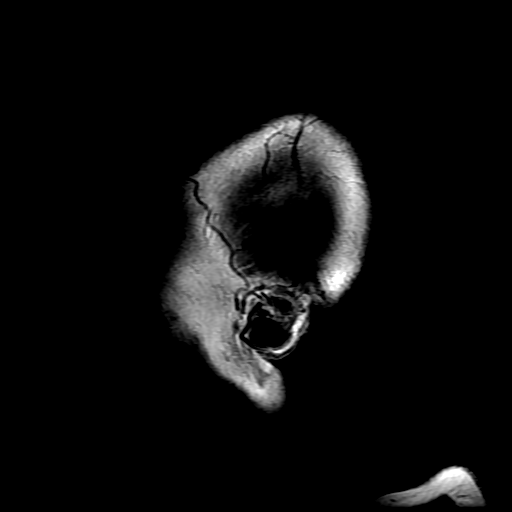

[Series 4: DWI · axial · 5.0mm · 1.09mm/px · z∈[-61,+84]mm · 5 of 60 slices shown (1 of 4)]
[im 1/60]
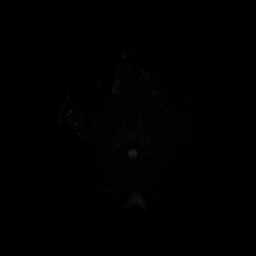
[im 15/60]
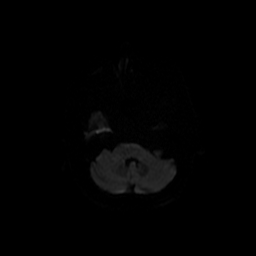
[im 30/60]
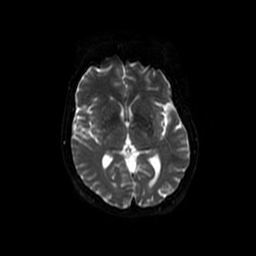
[im 45/60]
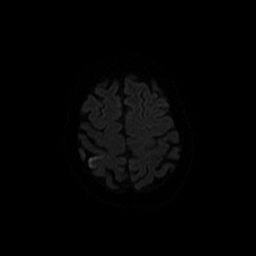
[im 60/60]
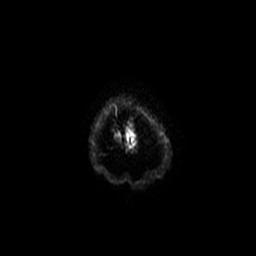

[Series 5: DWI · coronal · 5.0mm · 1.09mm/px · 6 of 66 slices shown (2 of 4)]
[im 1/66]
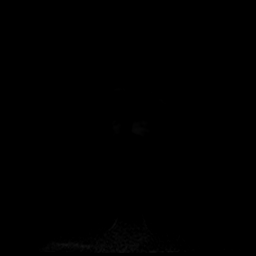
[im 14/66]
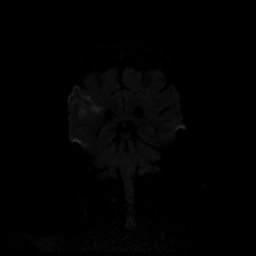
[im 27/66]
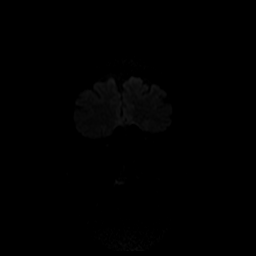
[im 40/66]
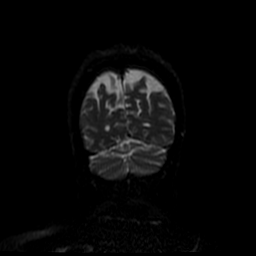
[im 53/66]
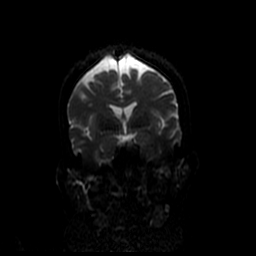
[im 66/66]
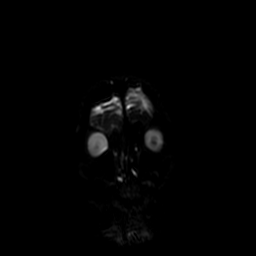

[Series 6: (id) mt fs · axial · 1.4mm · 0.43mm/px · z∈[-62,-37]mm · 3 of 136 slices shown]
[im 1/136]
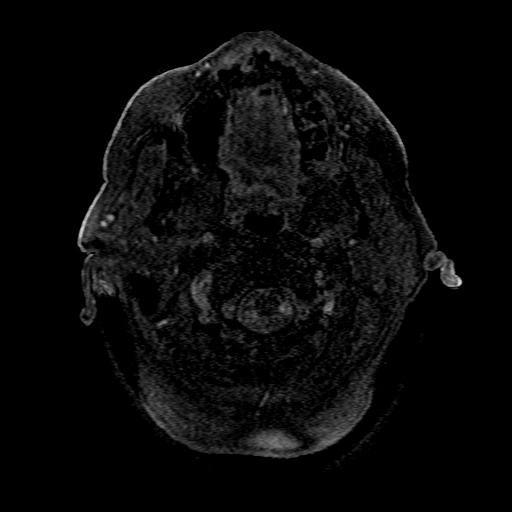
[im 25/136]
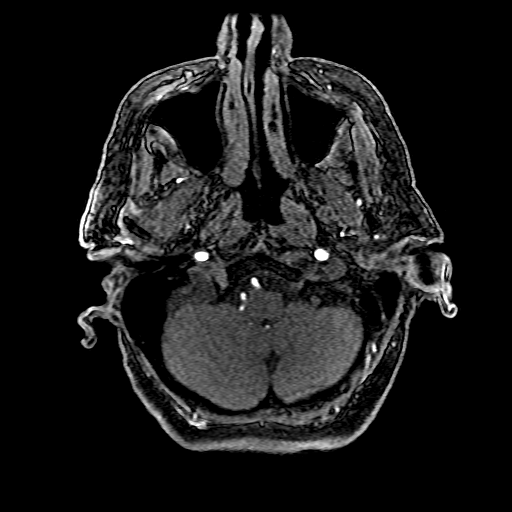
[im 37/136]
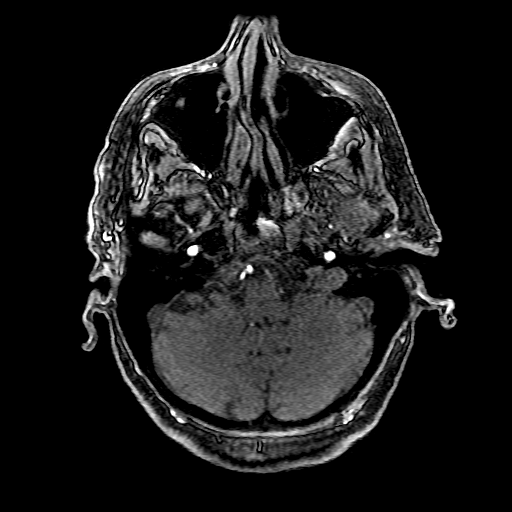

[Series 7: T2 · axial · 5.0mm · 0.43mm/px · z∈[-56,+88]mm · 2 of 25 slices shown (1 of 2)]
[im 1/25]
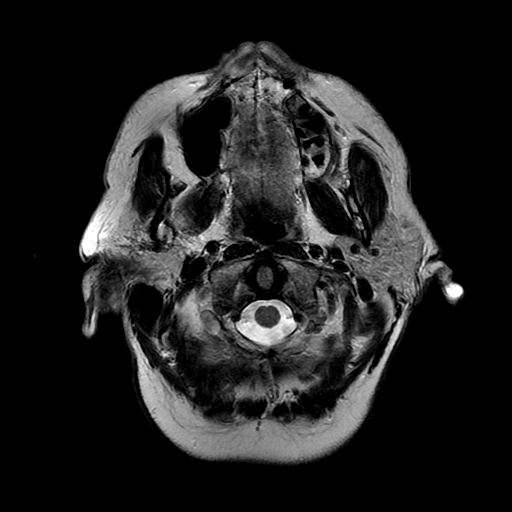
[im 25/25]
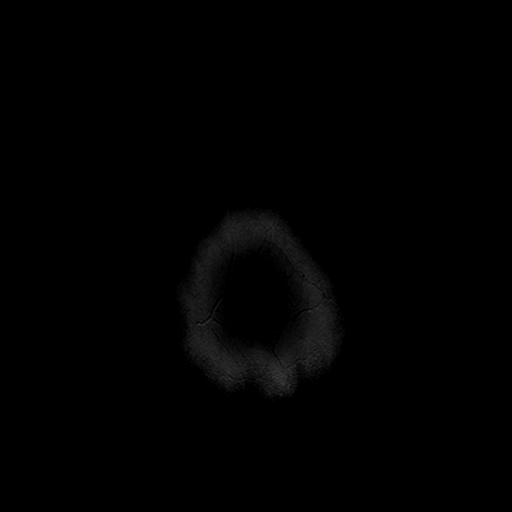

[Series 8: FLAIR · axial · 5.0mm · 0.43mm/px · z∈[-56,+88]mm · 2 of 25 slices shown]
[im 1/25]
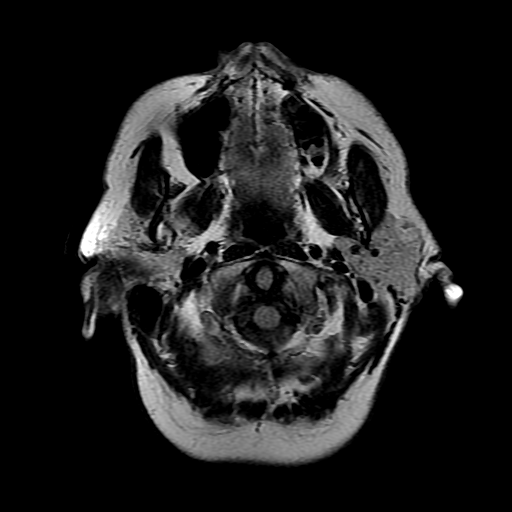
[im 25/25]
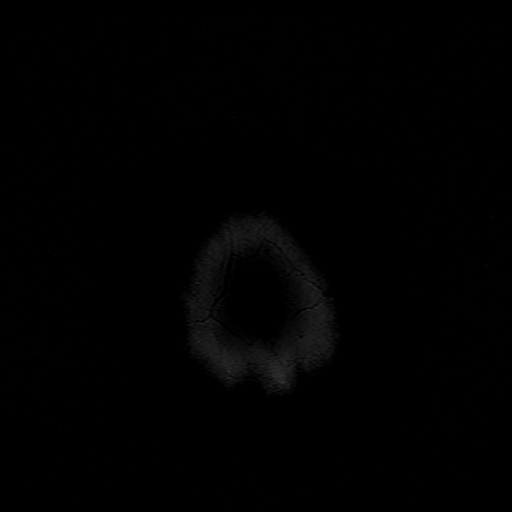

[Series 11: T2 · coronal · 5.0mm · 0.43mm/px · 3 of 29 slices shown (2 of 2)]
[im 1/29]
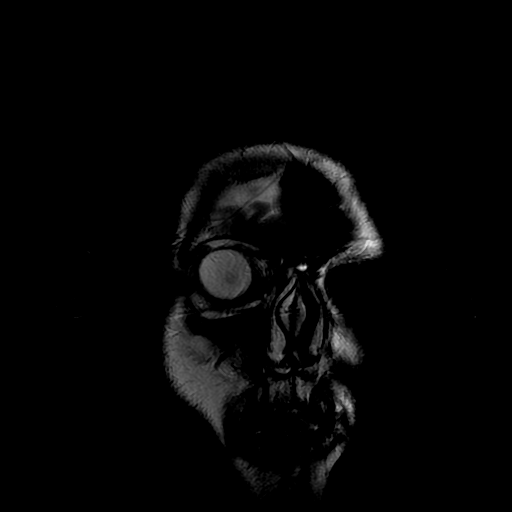
[im 15/29]
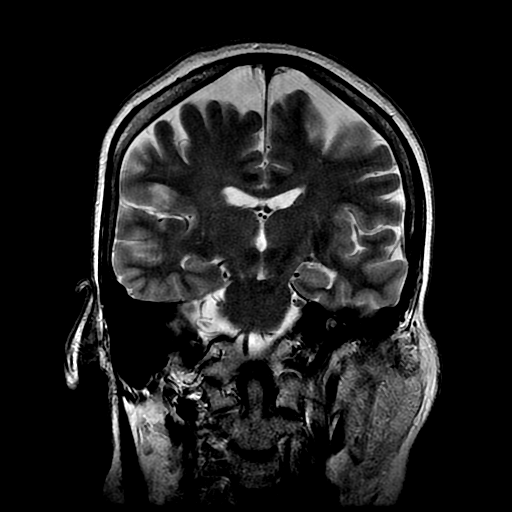
[im 29/29]
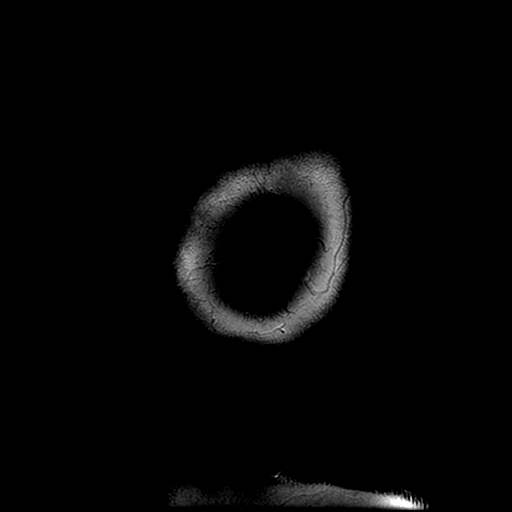

[Series 400: DWI · axial · 5.0mm · 1.09mm/px · z∈[-61,+84]mm · 3 of 30 slices shown (3 of 4)]
[im 1/30]
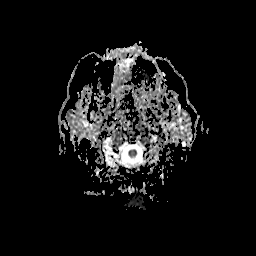
[im 15/30]
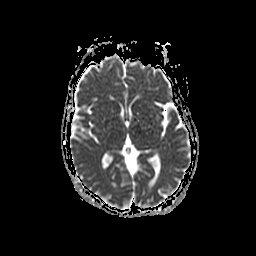
[im 30/30]
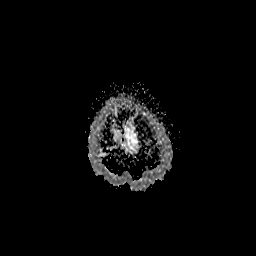

[Series 500: DWI · coronal · 5.0mm · 1.09mm/px · 3 of 33 slices shown (4 of 4)]
[im 1/33]
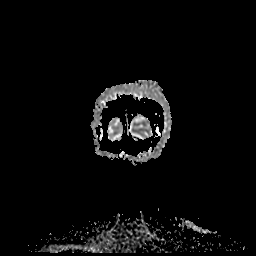
[im 17/33]
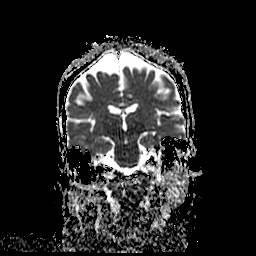
[im 33/33]
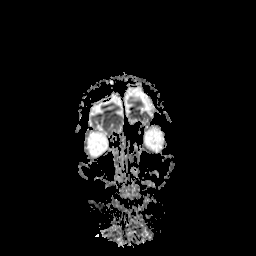

[29 of 48 positions shown; findings below may reference images not displayed]

FINDINGS: MRI HEAD FINDINGS

There is a new wedge-shaped area of restricted diffusion involving
the cortical gray matter and subcortical white matter of the
posterior right MCA territory in the right parietal lobe (series 4,
image 20). Finding is consistent with acute ischemic infarct.
Overall size of the infarct measures 1.8 x 3.5 cm. No associated
intracranial hemorrhage. Minimal gyral edema seen on T2/FLAIR
weighted sequence within this region. No significant mass effect.

Mild generalized atrophy present. Additional scattered patchy
T2/FLAIR hyperintensity seen within the deep white matter both
cerebral hemispheres noted, likely related to mild chronic small
vessel ischemic disease.

No mass lesion or midline shift. Ventricles are normal in size
without evidence of hydrocephalus. No extra-axial fluid collection.

Craniocervical junction is normal. Pituitary gland within normal
limits. No acute abnormality seen about either orbit.

Calvarium and scalp soft tissues within normal limits.

Paranasal sinuses and mastoid air cells are clear.

MRA HEAD FINDINGS

ANTERIOR CIRCULATION:

Visualized distal cervical segments of the internal carotid arteries
are widely patent with antegrade flow. Petrous and cavernous
segments are widely patent. A1 segments are symmetric and well
opacified. Anterior communicating artery within normal limits.
Anterior cerebral arteries well opacified.

Right right MCA bifurcation is normal. The distal right MCA branches
are grossly opacified, with no proximal branch occlusion.

Left M1 segment, left MCA bifurcation, and distal left MCA branches
are within normal limits. M1 segment is well opacified without
evidence of proximal branch occlusion or hemodynamically significant
stenosis.

POSTERIOR CIRCULATION:

The left vertebral artery is dominant. Posterior inferior cerebral
arteries within normal limits. Vertebrobasilar junction and basilar
artery are widely patent with antegrade flow. Posterior cerebral
arteries and superior cerebellar arteries widely patent. No
hemodynamically significant stenosis or proximal branch occlusion
seen within the posterior circulation.

No intracranial aneurysm.
IMPRESSION: MRI HEAD IMPRESSION:

1. Acute ischemic right parietal lobe infarct involving the
posterior right MCA territory. No associated hemorrhage or
significant mass effect.
2. Atrophy with mild chronic small vessel ischemic changes involving
the supratentorial white matter.

MRA HEAD IMPRESSION:

Unremarkable MRA of the brain with no hemodynamically significant
stenosis, proximal branch occlusion, or other abnormality
identified.

## 2016-09-07 ENCOUNTER — Telehealth (HOSPITAL_COMMUNITY): Payer: Self-pay | Admitting: Cardiology

## 2016-09-07 NOTE — Telephone Encounter (Signed)
Pt called with concerns about allergies and congestion Request a list of OTC allergy meds she can take  Advised of OTC options for antihistamines and cough/cold/guaifenesin per [facility]

## 2016-09-25 ENCOUNTER — Telehealth: Payer: Self-pay | Admitting: *Deleted

## 2016-09-25 NOTE — Telephone Encounter (Signed)
I do not prescribe Entresto to [name]. I believe the prior authorization will need to be sent to her cardiologist's office [name], MD) who prescribes this medication. I think we forwarded this already to Dr. Alford Highland office on 07/11/2016 per Epic review.   Anders Simmonds, MD [facility], PGY-3

## 2016-09-25 NOTE — Telephone Encounter (Signed)
Prior Authorization received from [employer] for [place] . PA form placed in provider box for completion. Clovis Pu, RN

## 2016-10-05 ENCOUNTER — Ambulatory Visit (INDEPENDENT_AMBULATORY_CARE_PROVIDER_SITE_OTHER): Payer: Medicare HMO | Admitting: *Deleted

## 2016-10-05 DIAGNOSIS — I428 Other cardiomyopathies: Secondary | ICD-10-CM | POA: Diagnosis not present

## 2016-10-05 NOTE — Progress Notes (Signed)
Remote ICD transmission.   

## 2016-10-06 ENCOUNTER — Encounter: Payer: Self-pay | Admitting: Cardiology

## 2016-10-09 LAB — CUP PACEART REMOTE DEVICE CHECK
Battery Remaining Longevity: 69 mo
Brady Statistic AP VP Percent: 0 %
Brady Statistic AP VS Percent: 0 %
Brady Statistic AS VS Percent: 100 %
Brady Statistic RA Percent Paced: 0 %
Brady Statistic RV Percent Paced: 0.01 %
Date Time Interrogation Session: 20180920124531
HighPow Impedance: 95 Ohm
Implantable Lead Implant Date: 20151021
Implantable Lead Location: 753859
Implantable Lead Location: 753860
Implantable Lead Model: 4298
Implantable Lead Model: 5076
Lead Channel Impedance Value: 399 Ohm
Lead Channel Impedance Value: 418 Ohm
Lead Channel Impedance Value: 456 Ohm
Lead Channel Impedance Value: 513 Ohm
Lead Channel Impedance Value: 513 Ohm
Lead Channel Impedance Value: 532 Ohm
Lead Channel Impedance Value: 665 Ohm
Lead Channel Impedance Value: 836 Ohm
Lead Channel Pacing Threshold Amplitude: 0.625 V
Lead Channel Pacing Threshold Pulse Width: 0.4 ms
Lead Channel Sensing Intrinsic Amplitude: 12 mV
Lead Channel Sensing Intrinsic Amplitude: 12 mV
Lead Channel Sensing Intrinsic Amplitude: 5.25 mV
Lead Channel Setting Pacing Amplitude: 2 V
Lead Channel Setting Sensing Sensitivity: 0.3 mV
MDC IDC LEAD IMPLANT DT: 20151021
MDC IDC LEAD IMPLANT DT: 20151021
MDC IDC LEAD LOCATION: 753858
MDC IDC MSMT BATTERY VOLTAGE: 2.98 V
MDC IDC MSMT LEADCHNL LV IMPEDANCE VALUE: 703 Ohm
MDC IDC MSMT LEADCHNL LV IMPEDANCE VALUE: 760 Ohm
MDC IDC MSMT LEADCHNL LV IMPEDANCE VALUE: 779 Ohm
MDC IDC MSMT LEADCHNL LV IMPEDANCE VALUE: 817 Ohm
MDC IDC MSMT LEADCHNL LV PACING THRESHOLD AMPLITUDE: 1.125 V
MDC IDC MSMT LEADCHNL LV PACING THRESHOLD PULSEWIDTH: 0.5 ms
MDC IDC MSMT LEADCHNL RA IMPEDANCE VALUE: 551 Ohm
MDC IDC MSMT LEADCHNL RA PACING THRESHOLD AMPLITUDE: 0.375 V
MDC IDC MSMT LEADCHNL RA PACING THRESHOLD PULSEWIDTH: 0.4 ms
MDC IDC MSMT LEADCHNL RA SENSING INTR AMPL: 5.25 mV
MDC IDC PG IMPLANT DT: 20151021
MDC IDC SET LEADCHNL RV PACING PULSEWIDTH: 0.4 ms
MDC IDC STAT BRADY AS VP PERCENT: 0 %

## 2016-10-21 ENCOUNTER — Other Ambulatory Visit: Payer: Self-pay | Admitting: Family Medicine

## 2016-10-26 ENCOUNTER — Encounter (HOSPITAL_COMMUNITY): Payer: Self-pay | Admitting: Cardiology

## 2016-12-22 ENCOUNTER — Encounter: Payer: Self-pay | Admitting: Internal Medicine

## 2016-12-25 ENCOUNTER — Encounter (HOSPITAL_COMMUNITY): Payer: Self-pay | Admitting: Cardiology

## 2017-01-04 ENCOUNTER — Telehealth: Payer: Self-pay | Admitting: Cardiology

## 2017-01-04 ENCOUNTER — Ambulatory Visit (INDEPENDENT_AMBULATORY_CARE_PROVIDER_SITE_OTHER): Payer: Medicare HMO | Admitting: *Deleted

## 2017-01-04 DIAGNOSIS — I5022 Chronic systolic (congestive) heart failure: Secondary | ICD-10-CM

## 2017-01-04 DIAGNOSIS — I428 Other cardiomyopathies: Secondary | ICD-10-CM | POA: Diagnosis not present

## 2017-01-04 NOTE — Progress Notes (Signed)
Remote ICD transmission.   

## 2017-01-04 NOTE — Telephone Encounter (Signed)
LMOVM reminding pt to send remote transmission.   

## 2017-01-05 ENCOUNTER — Encounter: Payer: Self-pay | Admitting: Cardiology

## 2017-01-10 ENCOUNTER — Encounter: Payer: Self-pay | Admitting: Family Medicine

## 2017-01-10 ENCOUNTER — Ambulatory Visit (INDEPENDENT_AMBULATORY_CARE_PROVIDER_SITE_OTHER): Payer: Medicare HMO | Admitting: Family Medicine

## 2017-01-10 ENCOUNTER — Other Ambulatory Visit: Payer: Self-pay

## 2017-01-10 VITALS — BP 124/68 | HR 88 | Temp 98.0°F | Ht 65.0 in | Wt 178.0 lb

## 2017-01-10 DIAGNOSIS — J069 Acute upper respiratory infection, unspecified: Secondary | ICD-10-CM | POA: Diagnosis not present

## 2017-01-10 NOTE — Progress Notes (Signed)
    Subjective:  [name] is a 66 y.o. female who presents to the [facility] today with a chief complaint of cough.   HPI:  Cough - States her husband gave her the cold 2 days ago, her whole family has been sick - Cough started yesterday, productive with clear sputum. Has some post nasal dripping at night causing some sore throat in the morning - Nasal congestion is causing her ears and face to feel full. Taking zyrtec for this which hlps - no fevers/chills, no nausea/vomiting, no rigors - no h/o lung disease and never smoker - Has been drinking and eating okay   ROS: Per HPI  Objective:  Physical Exam: BP 124/68   Pulse 88   Temp 98 F (36.7 C) (Oral)   Ht 5\' 5"  (1.651 m)   Wt 178 lb (80.7 kg)   SpO2 97%   BMI 29.62 kg/m   Gen: NAD, resting comfortably HEENT: [place], AT. TMs with good light reflex bilaterally, non erythematous. Oropharynx mildly erythematous without exudates Neck: supple, no cervical lymphadenopathy CV: RRR with no murmurs appreciated Pulm: NWOB, CTAB with no crackles, wheezes, or rhonchi GI: Normal bowel sounds present. Soft, Nontender, Nondistended. MSK: no edema, cyanosis, or clubbing noted Skin: warm, dry Neuro: grossly normal, moves all extremities Psych: Normal affect and thought content   Assessment/Plan:  Viral URI Cough, congestion, sinus pressure for 1-2 days. No red flags on history or exam. No signs of bacterial otitis or strep pharyngitis. Has been able to hydrate well at home. No chronic lung disease. Discussed symptomatic management at home with supportive care. Return precautions given.   Leland Her, DO PGY-2, [facility] 01/10/2017 10:04 AM

## 2017-01-10 NOTE — Assessment & Plan Note (Signed)
Cough, congestion, sinus pressure for 1-2 days. No red flags on history or exam. No signs of bacterial otitis or strep pharyngitis. Has been able to hydrate well at home. No chronic lung disease. Discussed symptomatic management at home with supportive care. Return precautions given.

## 2017-01-10 NOTE — Patient Instructions (Signed)
Over the counter mucinex  Steam for sinuses and over the counter tylenol. Keeping doing the flonase.  Vicks vapor rub  Stay well hydrated   Viral Respiratory Infection A viral respiratory infection is an illness that affects parts of the body used for breathing, like the lungs, nose, and throat. It is caused by a germ called a virus. Some examples of this kind of infection are:  A cold.  The flu (influenza).  A respiratory syncytial virus (RSV) infection.  How do I know if I have this infection? Most of the time this infection causes:  A stuffy or runny nose.  Yellow or green fluid in the nose.  A cough.  Sneezing.  Tiredness (fatigue).  Achy muscles.  A sore throat.  Sweating or chills.  A fever.  A headache.  How is this infection treated? If the flu is diagnosed early, it may be treated with an antiviral medicine. This medicine shortens the length of time a person has symptoms. Symptoms may be treated with over-the-counter and prescription medicines, such as:  Expectorants. These make it easier to cough up mucus.  Decongestant nasal sprays.  Doctors do not prescribe antibiotic medicines for viral infections. They do not work with this kind of infection. How do I know if I should stay home? To keep others from getting sick, stay home if you have:  A fever.  A lasting cough.  A sore throat.  A runny nose.  Sneezing.  Muscles aches.  Headaches.  Tiredness.  Weakness.  Chills.  Sweating.  An upset stomach (nausea).  Follow these instructions at home:  Rest as much as possible.  Take over-the-counter and prescription medicines only as told by your doctor.  Drink enough fluid to keep your pee (urine) clear or pale yellow.  Gargle with salt water. Do this 3-4 times per day or as needed. To make a salt-water mixture, dissolve -1 tsp of salt in 1 cup of warm water. Make sure the salt dissolves all the way.  Use nose drops made from  salt water. This helps with stuffiness (congestion). It also helps soften the skin around your nose.  Do not drink alcohol.  Do not use tobacco products, including cigarettes, chewing tobacco, and e-cigarettes. If you need help quitting, ask your doctor. Get help if:  Your symptoms last for 10 days or longer.  Your symptoms get worse over time.  You have a fever.  You have very bad pain in your face or forehead.  Parts of your jaw or neck become very swollen. Get help right away if:  You feel pain or pressure in your chest.  You have shortness of breath.  You faint or feel like you will faint.  You keep throwing up (vomiting).  You feel confused. This information is not intended to replace advice given to you by your health care provider. Make sure you discuss any questions you have with your health care provider. Document Released: 12/16/2007 Document Revised: 06/10/2015 Document Reviewed: 06/10/2014 Elsevier Interactive Patient Education  2018 [employer].

## 2017-01-12 ENCOUNTER — Ambulatory Visit (INDEPENDENT_AMBULATORY_CARE_PROVIDER_SITE_OTHER): Payer: Medicare HMO | Admitting: Internal Medicine

## 2017-01-12 ENCOUNTER — Encounter: Payer: Self-pay | Admitting: Internal Medicine

## 2017-01-12 ENCOUNTER — Telehealth: Payer: Self-pay | Admitting: Internal Medicine

## 2017-01-12 VITALS — BP 122/70 | HR 65 | Ht 65.0 in | Wt 176.0 lb

## 2017-01-12 DIAGNOSIS — I447 Left bundle-branch block, unspecified: Secondary | ICD-10-CM

## 2017-01-12 DIAGNOSIS — I5022 Chronic systolic (congestive) heart failure: Secondary | ICD-10-CM | POA: Diagnosis not present

## 2017-01-12 DIAGNOSIS — I428 Other cardiomyopathies: Secondary | ICD-10-CM

## 2017-01-12 DIAGNOSIS — Z95810 Presence of automatic (implantable) cardiac defibrillator: Secondary | ICD-10-CM

## 2017-01-12 LAB — BASIC METABOLIC PANEL
BUN/Creatinine Ratio: 15 (ref 12–28)
BUN: 12 mg/dL (ref 8–27)
CHLORIDE: 104 mmol/L (ref 96–106)
CO2: 20 mmol/L (ref 20–29)
Calcium: 9.2 mg/dL (ref 8.7–10.3)
Creatinine, Ser: 0.79 mg/dL (ref 0.57–1.00)
GFR, EST AFRICAN AMERICAN: 89 mL/min/{1.73_m2} (ref >59)
GFR, EST NON AFRICAN AMERICAN: 77 mL/min/{1.73_m2} (ref >59)
Glucose: 86 mg/dL (ref 65–99)
POTASSIUM: 3.9 mmol/L (ref 3.5–5.2)
SODIUM: 141 mmol/L (ref 134–144)

## 2017-01-12 LAB — CUP PACEART INCLINIC DEVICE CHECK
Brady Statistic AP VP Percent: 0 %
Brady Statistic AS VP Percent: 0 %
Brady Statistic RA Percent Paced: 0 %
Date Time Interrogation Session: 20181228153250
HIGH POWER IMPEDANCE MEASURED VALUE: 77 Ohm
Implantable Lead Implant Date: 20151021
Implantable Lead Implant Date: 20151021
Implantable Lead Location: 753858
Implantable Lead Model: 4298
Lead Channel Impedance Value: 589 Ohm
Lead Channel Impedance Value: 589 Ohm
Lead Channel Impedance Value: 608 Ohm
Lead Channel Impedance Value: 893 Ohm
Lead Channel Impedance Value: 931 Ohm
Lead Channel Impedance Value: 931 Ohm
Lead Channel Pacing Threshold Amplitude: 0.375 V
Lead Channel Pacing Threshold Amplitude: 0.75 V
Lead Channel Pacing Threshold Pulse Width: 0.4 ms
Lead Channel Pacing Threshold Pulse Width: 0.5 ms
Lead Channel Sensing Intrinsic Amplitude: 13 mV
Lead Channel Sensing Intrinsic Amplitude: 5 mV
MDC IDC LEAD IMPLANT DT: 20151021
MDC IDC LEAD LOCATION: 753859
MDC IDC LEAD LOCATION: 753860
MDC IDC MSMT BATTERY REMAINING LONGEVITY: 61 mo
MDC IDC MSMT BATTERY VOLTAGE: 2.98 V
MDC IDC MSMT LEADCHNL LV IMPEDANCE VALUE: 418 Ohm
MDC IDC MSMT LEADCHNL LV IMPEDANCE VALUE: 456 Ohm
MDC IDC MSMT LEADCHNL LV IMPEDANCE VALUE: 722 Ohm
MDC IDC MSMT LEADCHNL LV IMPEDANCE VALUE: 817 Ohm
MDC IDC MSMT LEADCHNL LV IMPEDANCE VALUE: 950 Ohm
MDC IDC MSMT LEADCHNL LV PACING THRESHOLD AMPLITUDE: 1.125 V
MDC IDC MSMT LEADCHNL RA PACING THRESHOLD PULSEWIDTH: 0.4 ms
MDC IDC MSMT LEADCHNL RA SENSING INTR AMPL: 4.625 mV
MDC IDC MSMT LEADCHNL RV IMPEDANCE VALUE: 456 Ohm
MDC IDC MSMT LEADCHNL RV IMPEDANCE VALUE: 532 Ohm
MDC IDC MSMT LEADCHNL RV SENSING INTR AMPL: 12.25 mV
MDC IDC PG IMPLANT DT: 20151021
MDC IDC SET LEADCHNL RV PACING AMPLITUDE: 2 V
MDC IDC SET LEADCHNL RV PACING PULSEWIDTH: 0.4 ms
MDC IDC SET LEADCHNL RV SENSING SENSITIVITY: 0.3 mV
MDC IDC STAT BRADY AP VS PERCENT: 0 %
MDC IDC STAT BRADY AS VS PERCENT: 100 %
MDC IDC STAT BRADY RV PERCENT PACED: 0.01 %

## 2017-01-12 NOTE — Patient Instructions (Addendum)
Medication Instructions: Your physician recommends that you continue on your current medications as directed. Please refer to the Current Medication list given to you today.  Labwork: Your physician has recommended that you have lab work today: BMET   Procedures/Testing: None Ordered  Follow-Up: Your physician wants you to follow-up in: 1 YEAR with Francis Dowse, PA-C. You will receive a reminder letter in the mail two months in advance. If you don't receive a letter, please call our office to schedule the follow-up appointment.  Remote monitoring is used to monitor your ICD from home. This monitoring reduces the number of office visits required to check your device to one time per year. It allows [place] to keep an eye on the functioning of your device to ensure it is working properly. You are scheduled for a device check from home on 04/05/17. You may send your transmission at any time that day. If you have a wireless device, the transmission will be sent automatically. After your physician reviews your transmission, you will receive a postcard with your next transmission date.   If you need a refill on your cardiac medications before your next appointment, please call your pharmacy.

## 2017-01-12 NOTE — Progress Notes (Signed)
Patient Care Team: Beaulah Dinning, MD as PCP - General (Family Medicine) [name], MD as Consulting Physician (Cardiology)   HPI  [name] is a 66 y.o. female Seen With Chief Complaint of   Medtronic CRT-D 2015 for NICM LBBB and CHF  She is S/p CVA without interval detection of atrial fibrillation.     July 2015. and was found to have an ejection fraction 20-25%. Catheterization demonstrated no coronary disease.  DATE TEST    7 /15 Echo   EF 20-25 %   12/17 Echo   EF 45-50 %         The patient denies chest pain, shortness of breath, nocturnal dyspnea, orthopnea or peripheral edema.  There have been no palpitations, lightheadedness or syncope.   She doesn't like taking potassium, and doesn't feel like lasix is doing much  Date Cr K  4/18 0.8 3.4          Past Medical History:  Diagnosis Date  . Arthritis   . Automatic implantable cardioverter-defibrillator in situ   . Cardiac resynchronization therapy defibrillator (CRT-D) in place    a. placed on 11/05/13.  [name] CHF (congestive heart failure) (HCC)   . GERD (gastroesophageal reflux disease)   . HLD (hyperlipidemia)   . Hypotension   . LBBB (left bundle branch block)   . Mitral regurgitation    a. Mild-mod MR by echo 07/2013.  [name] PONV (postoperative nausea and vomiting)   . Stroke (HCC) 09/23/2013  . Systolic CHF (HCC)    a. Dx 07/2013 - due to NICM. Cath normal cors. EF 25%.    Past Surgical History:  Procedure Laterality Date  . BI-VENTRICULAR IMPLANTABLE CARDIOVERTER DEFIBRILLATOR N/A 11/05/2013   Procedure: BI-VENTRICULAR IMPLANTABLE CARDIOVERTER DEFIBRILLATOR  (CRT-D);  Surgeon: Duke Salvia, MD;  Location: [facility] CATH LAB;  Service: Cardiovascular;  Laterality: N/A;  . BI-VENTRICULAR IMPLANTABLE CARDIOVERTER DEFIBRILLATOR  (CRT-D)  11/05/2013  . CARDIAC CATHETERIZATION  07/2013  . KNEE ARTHROSCOPY Right   . LEFT AND RIGHT HEART CATHETERIZATION WITH CORONARY ANGIOGRAM N/A 08/08/2013   Procedure: LEFT AND RIGHT HEART CATHETERIZATION WITH CORONARY ANGIOGRAM;  Surgeon: [name], MD;  Location: [facility] CATH LAB;  Service: Cardiovascular;  Laterality: N/A;  . TEE WITHOUT CARDIOVERSION N/A 09/26/2013   Procedure: TRANSESOPHAGEAL ECHOCARDIOGRAM (TEE);  Surgeon: [name], MD;  Location: [facility] ENDOSCOPY;  Service: Cardiovascular;  Laterality: N/A;  . TONSILLECTOMY    . WRIST ARTHROSCOPY  12/21/2010   Procedure: ARTHROSCOPY WRIST;  Surgeon: [name], MD;  Location: [facility];  Service: Orthopedics;  Laterality: Right;  right wrist, repair TFCC on right, open reconstruction ECU sheath    Current Outpatient Medications  Medication Sig Dispense Refill  . atorvastatin (LIPITOR) 40 MG tablet Take 1 tablet (40 mg total) by mouth daily at 6 PM. 90 tablet 1  . carvedilol (COREG) 6.25 MG tablet Take 1 tablet (6.25 mg total) by mouth 2 (two) times daily with a meal. 180 tablet 3  . clopidogrel (PLAVIX) 75 MG tablet Take 1 tablet (75 mg total) by mouth daily. 90 tablet 3  . fluticasone (FLONASE) 50 MCG/ACT nasal spray Place 2 sprays into both nostrils daily. 16 g 0  . furosemide (LASIX) 40 MG tablet Take 1 tablet (40 mg total) by mouth daily. 90 tablet 1  . potassium chloride SA (K-DUR,KLOR-CON) 20 MEQ tablet Take 1 tablet (20 mEq total) by mouth daily. 90 tablet 3  . potassium chloride  SA (K-DUR,KLOR-CON) 20 MEQ tablet Take 1 tablet (20 mEq total) by mouth daily. 30 tablet 5  . ranitidine (ZANTAC) 150 MG tablet Take 1 tablet (150 mg total) by mouth 2 (two) times daily. 60 tablet 3  . sacubitril-valsartan (ENTRESTO) 24-26 MG Take 1 tablet by mouth 2 (two) times daily. 180 tablet 3   No current facility-administered medications for this visit.     Allergies  Allergen Reactions  . Onion Other (See Comments)    gas  . Tomato Other (See Comments)    reflux    Review of Systems negative except from HPI and PMH  Physical Exam BP 122/70   Pulse 65   Ht 5\' 5"   (1.651 m)   Wt 176 lb (79.8 kg)   SpO2 98%   BMI 29.29 kg/m  Well developed and nourished in no acute distress HENT normal Neck supple with JVP-flat Clear Regular rate and rhythm, no murmurs or gallops Abd-soft with active BS No Clubbing cyanosis edema Skin-warm and dry A & Oriented  Walks with cane   ECG demonstrates sinus 65 13/11/43 PRWP  Assessment and  Plan   Nonischemic cardio myopathy  ICD-CRT Medtronic   Congestive heart failure-chronic systolic  Left bundle branch block intermittent  Prior stroke with residual weakness  Euvolemic continue current meds  Hypokalemia  Will recheck  She may be a candidate for aldactone as opposed to lasix and K-- she would prefer but will defer to Dr DM  The patient's device was interrogated.  The information was reviewed. No changes were made in the programming.     No interval afib    Continue current meds

## 2017-01-12 NOTE — Telephone Encounter (Signed)
New message    Pt stopped by the front on her way out this morning and asked to send a message to Dr. Graciela Husbands that she can not take spirolactone. She said that Dr. Shirlee Latch tried her on that medication once before and it made her very sick.

## 2017-01-12 NOTE — Telephone Encounter (Signed)
She may be a candidate for aldactone as opposed to lasix and K-- she would prefer but will defer to Dr DM  The patient's device was interrogated.  The information was reviewed. No changes were made in the programming.     No interval afib    Continue current meds   Will route to Dr. Graciela Husbands to make him aware.

## 2017-01-13 LAB — CUP PACEART REMOTE DEVICE CHECK
Battery Voltage: 2.98 V
Brady Statistic AP VS Percent: 0 %
Brady Statistic AS VP Percent: 0 %
Brady Statistic AS VS Percent: 100 %
HighPow Impedance: 80 Ohm
Implantable Lead Implant Date: 20151021
Implantable Lead Location: 753858
Implantable Lead Location: 753860
Implantable Lead Model: 4298
Implantable Lead Model: 5076
Lead Channel Impedance Value: 475 Ohm
Lead Channel Impedance Value: 513 Ohm
Lead Channel Impedance Value: 551 Ohm
Lead Channel Impedance Value: 931 Ohm
Lead Channel Impedance Value: 988 Ohm
Lead Channel Impedance Value: 988 Ohm
Lead Channel Pacing Threshold Amplitude: 0.375 V
Lead Channel Pacing Threshold Amplitude: 1.125 V
Lead Channel Pacing Threshold Pulse Width: 0.5 ms
Lead Channel Sensing Intrinsic Amplitude: 4.875 mV
Lead Channel Setting Sensing Sensitivity: 0.3 mV
MDC IDC LEAD IMPLANT DT: 20151021
MDC IDC LEAD IMPLANT DT: 20151021
MDC IDC LEAD LOCATION: 753859
MDC IDC MSMT BATTERY REMAINING LONGEVITY: 61 mo
MDC IDC MSMT LEADCHNL LV IMPEDANCE VALUE: 475 Ohm
MDC IDC MSMT LEADCHNL LV IMPEDANCE VALUE: 608 Ohm
MDC IDC MSMT LEADCHNL LV IMPEDANCE VALUE: 665 Ohm
MDC IDC MSMT LEADCHNL LV IMPEDANCE VALUE: 722 Ohm
MDC IDC MSMT LEADCHNL LV IMPEDANCE VALUE: 874 Ohm
MDC IDC MSMT LEADCHNL LV IMPEDANCE VALUE: 988 Ohm
MDC IDC MSMT LEADCHNL RA IMPEDANCE VALUE: 608 Ohm
MDC IDC MSMT LEADCHNL RA PACING THRESHOLD PULSEWIDTH: 0.4 ms
MDC IDC MSMT LEADCHNL RA SENSING INTR AMPL: 4.875 mV
MDC IDC MSMT LEADCHNL RV PACING THRESHOLD AMPLITUDE: 0.625 V
MDC IDC MSMT LEADCHNL RV PACING THRESHOLD PULSEWIDTH: 0.4 ms
MDC IDC MSMT LEADCHNL RV SENSING INTR AMPL: 12 mV
MDC IDC MSMT LEADCHNL RV SENSING INTR AMPL: 12 mV
MDC IDC PG IMPLANT DT: 20151021
MDC IDC SESS DTM: 20181220205759
MDC IDC SET LEADCHNL RV PACING AMPLITUDE: 2 V
MDC IDC SET LEADCHNL RV PACING PULSEWIDTH: 0.4 ms
MDC IDC STAT BRADY AP VP PERCENT: 0 %
MDC IDC STAT BRADY RA PERCENT PACED: 0 %
MDC IDC STAT BRADY RV PERCENT PACED: 0.01 %

## 2017-01-15 ENCOUNTER — Other Ambulatory Visit: Payer: Self-pay | Admitting: Family Medicine

## 2017-01-17 ENCOUNTER — Telehealth: Payer: Self-pay

## 2017-01-17 NOTE — Telephone Encounter (Signed)
Pt is aware and agreeable to normal results Pt states she is fine taking her current medication regiment - she doesn't need the smaller tablets

## 2017-01-22 ENCOUNTER — Other Ambulatory Visit: Payer: Self-pay | Admitting: Family Medicine

## 2017-01-24 ENCOUNTER — Ambulatory Visit (INDEPENDENT_AMBULATORY_CARE_PROVIDER_SITE_OTHER): Payer: Medicare HMO | Admitting: Family Medicine

## 2017-01-24 ENCOUNTER — Other Ambulatory Visit: Payer: Self-pay

## 2017-01-24 ENCOUNTER — Encounter: Payer: Self-pay | Admitting: Family Medicine

## 2017-01-24 VITALS — BP 122/78 | HR 85 | Temp 97.9°F | Ht 65.0 in | Wt 180.8 lb

## 2017-01-24 DIAGNOSIS — Z1231 Encounter for screening mammogram for malignant neoplasm of breast: Secondary | ICD-10-CM | POA: Diagnosis not present

## 2017-01-24 DIAGNOSIS — Z1239 Encounter for other screening for malignant neoplasm of breast: Secondary | ICD-10-CM

## 2017-01-24 DIAGNOSIS — J309 Allergic rhinitis, unspecified: Secondary | ICD-10-CM | POA: Diagnosis not present

## 2017-01-24 DIAGNOSIS — I9589 Other hypotension: Secondary | ICD-10-CM | POA: Diagnosis not present

## 2017-01-24 MED ORDER — ATORVASTATIN CALCIUM 40 MG PO TABS: 40.0000 mg | ORAL_TABLET | Freq: Every day | ORAL | 1 refills | Status: DC

## 2017-01-24 MED ORDER — SACUBITRIL-VALSARTAN 24-26 MG PO TABS: 1.0000 | ORAL_TABLET | Freq: Two times a day (BID) | ORAL | 3 refills | Status: DC

## 2017-01-24 MED ORDER — POTASSIUM CHLORIDE CRYS ER 20 MEQ PO TBCR: 20.0000 meq | EXTENDED_RELEASE_TABLET | Freq: Every day | ORAL | 3 refills | Status: DC

## 2017-01-24 MED ORDER — CARVEDILOL 6.25 MG PO TABS: 6.2500 mg | ORAL_TABLET | Freq: Two times a day (BID) | ORAL | 3 refills | Status: DC

## 2017-01-24 MED ORDER — RANITIDINE HCL 150 MG PO TABS: 150.0000 mg | ORAL_TABLET | Freq: Two times a day (BID) | ORAL | 3 refills | Status: DC

## 2017-01-24 MED ORDER — CLOPIDOGREL BISULFATE 75 MG PO TABS: 75.0000 mg | ORAL_TABLET | Freq: Every day | ORAL | 3 refills | Status: DC

## 2017-01-24 MED ORDER — FLUTICASONE PROPIONATE 50 MCG/ACT NA SUSP: 2.0000 | Freq: Every day | NASAL | 0 refills | Status: DC

## 2017-01-24 MED ORDER — FUROSEMIDE 40 MG PO TABS: 40.0000 mg | ORAL_TABLET | Freq: Every day | ORAL | 1 refills | Status: DC

## 2017-01-24 NOTE — Assessment & Plan Note (Signed)
Nasal congestion and drainage most consistent with allergic rhinitis.  There is also an aspect of postnasal drip after a viral URI a couple weeks ago. -Continue Flonase -Can use over-the-counter Zyrtec if needed

## 2017-01-24 NOTE — Assessment & Plan Note (Signed)
No hypertension or hypotension today.  Blood pressure excellent at 122/78.  BP normal at home.  Continues to follow with her cardiologist -Continue current medications -Follow-up in 3 months

## 2017-01-24 NOTE — Patient Instructions (Addendum)
Thank you for coming in today, it was so nice to see you! Today we talked about:    You are doing great! We have refilled all your medications today  I will find out about mammogram for you.   Please follow up in 3 months for next check up. You can schedule this appointment at the front desk before you leave or call the clinic.  Bring in all your medications or supplements to each appointment for review.   If we ordered any tests today, you will be notified via telephone of any abnormalities. If everything is normal you will get a letter in the mail.   If you have any questions or concerns, please do not hesitate to call the office at [phone]. You can also message me directly via MyChart.   Sincerely,  Anders Simmonds, MD

## 2017-01-24 NOTE — Progress Notes (Signed)
Subjective:    Patient ID: [name] , female   DOB: March 06, 1950 , 67 y.o..   MRN: [id number]  HPI  [name] is a 67 yo F with past medical history of systolic CHF, left bundle branch block, hypertension, GERD hyperlipidemia, stroke here for  Chief Complaint  Patient presents with  . Nasal Congestion follow up   1. Blood pressure Blood pressure at home: Home BPs usually 120's/60-70's  Exercise: walks 2-4 miles a day Low salt diet: does not add salt in diet  Medications: Compliant with all her medications Side effects: None ROS: Denies headache, dizziness, visual changes, nausea, vomiting, chest pain, abdominal pain or shortness of breath. BP Readings from Last 3 Encounters:  01/24/17 122/78  01/12/17 122/70  01/10/17 124/68   2. Needs mammogram. Unsure if she can get one with pacemaker  3. Sinus drainage/Nasal congestion: Patient endorses some clear rhinnorhea and nasal congestion. Has been present for a few weeks following a viral URI. No fevers, dyspnea. Occasional cough, non productive.   Review of Systems: Per HPI.   Past Medical History: Patient Active Problem List   Diagnosis Date Noted  . Elevated alkaline phosphatase measurement 04/24/2016  . Left acute serous otitis media 04/13/2016  . Colonoscopy refused 09/20/2015  . Left knee pain 02/15/2015  . Allergic rhinitis 11/09/2014  . Cardiac LV ejection fraction 21-40% 12/03/2013  . Cerebral infarction due to embolism of cerebral artery (HCC) 12/03/2013  . Chronic systolic congestive heart failure, NYHA class 2 (HCC) 11/05/2013  . GERD (gastroesophageal reflux disease)   . HLD (hyperlipidemia)   . Chronic hypotension 09/25/2013  . Non-ischemic cardiomyopathy (HCC) 08/11/2013  . Mitral regurgitation   . Acute systolic CHF (congestive heart failure), NYHA class 4 (HCC) 08/08/2013  . Left bundle branch block 08/07/2013    Medications: reviewed and updated Current Outpatient Medications  Medication  Sig Dispense Refill  . atorvastatin (LIPITOR) 40 MG tablet Take 1 tablet (40 mg total) by mouth daily at 6 PM. 90 tablet 1  . carvedilol (COREG) 6.25 MG tablet Take 1 tablet (6.25 mg total) by mouth 2 (two) times daily with a meal. 180 tablet 3  . clopidogrel (PLAVIX) 75 MG tablet Take 1 tablet (75 mg total) by mouth daily. 90 tablet 3  . fluticasone (FLONASE) 50 MCG/ACT nasal spray Place 2 sprays into both nostrils daily. 16 g 0  . furosemide (LASIX) 40 MG tablet Take 1 tablet (40 mg total) by mouth daily. 90 tablet 1  . potassium chloride SA (K-DUR,KLOR-CON) 20 MEQ tablet Take 1 tablet (20 mEq total) by mouth daily. 90 tablet 3  . ranitidine (ZANTAC) 150 MG tablet Take 1 tablet (150 mg total) by mouth 2 (two) times daily. 60 tablet 3  . sacubitril-valsartan (ENTRESTO) 24-26 MG Take 1 tablet by mouth 2 (two) times daily. 180 tablet 3   No current facility-administered medications for this visit.     Social Hx:  reports that  has never smoked. she has never used smokeless tobacco.   Objective:   BP 122/78   Pulse 85   Temp 97.9 F (36.6 C) (Oral)   Ht 5\' 5"  (1.651 m)   Wt 180 lb 12.8 oz (82 kg)   SpO2 99%   BMI 30.09 kg/m  Physical Exam  Gen: NAD, alert, cooperative with exam, well-appearing HEENT:     Head: Normocephalic, atraumatic    Neck: No masses palpated. No goiter. No lymphadenopathy     Ears: External ears normal,  no drainage.Tympanic membranes intact, normal light reflex bilaterally, no erythema or bulging    Eyes: PERRLA, EOMI, sclera white, normal conjunctiva    Nose: nasal turbinates moist, clear nasal discharge, narrow nasal passages bilaterally    Throat: moist mucus membranes, no pharyngeal erythema, no tonsillar exudate. Airway is patent Cardiac: Regular rate and rhythm, normal S1/S2, no edema Respiratory: Clear to auscultation bilaterally, no wheezes, non-labored breathing Psych: good insight, normal mood and affect  Assessment & Plan:  Allergic  rhinitis Nasal congestion and drainage most consistent with allergic rhinitis.  There is also an aspect of postnasal drip after a viral URI a couple weeks ago. -Continue Flonase -Can use over-the-counter Zyrtec if needed  Chronic hypotension No hypertension or hypotension today.  Blood pressure excellent at 122/78.  BP normal at home.  Continues to follow with her cardiologist -Continue current medications -Follow-up in 3 months  Breast cancer screening -Mammogram ordered, she can obtain this with a pacemaker but she will just need to let the breast center know  Anders Simmonds, MD [facility], PGY-3

## 2017-01-30 ENCOUNTER — Other Ambulatory Visit: Payer: Self-pay | Admitting: Family Medicine

## 2017-01-31 ENCOUNTER — Telehealth: Payer: Self-pay | Admitting: Internal Medicine

## 2017-01-31 NOTE — Telephone Encounter (Signed)
New Message   Patient is wanting to know if its okay to have a mammogram since she has a pacemaker. Please call.

## 2017-01-31 NOTE — Telephone Encounter (Signed)
Lm for pt to call back per device nursing .Pt may have mammogram after 6 weeks from device  implant ./cy

## 2017-01-31 NOTE — Telephone Encounter (Signed)
Pt aware of recommendations ./cy 

## 2017-02-13 ENCOUNTER — Encounter (HOSPITAL_COMMUNITY): Payer: Self-pay | Admitting: Cardiology

## 2017-03-23 ENCOUNTER — Ambulatory Visit (HOSPITAL_COMMUNITY)
Admission: RE | Admit: 2017-03-23 | Discharge: 2017-03-23 | Disposition: A | Discharge status: Home / Self Care | Payer: Medicare HMO | Source: Ambulatory Visit | Attending: Cardiology | Admitting: Cardiology

## 2017-03-23 ENCOUNTER — Encounter: Payer: Self-pay | Admitting: Cardiology

## 2017-03-23 ENCOUNTER — Encounter (HOSPITAL_COMMUNITY): Payer: Self-pay | Admitting: Cardiology

## 2017-03-23 ENCOUNTER — Encounter (HOSPITAL_COMMUNITY): Payer: Self-pay

## 2017-03-23 ENCOUNTER — Other Ambulatory Visit: Payer: Self-pay

## 2017-03-23 VITALS — BP 109/59 | HR 71 | Wt 181.5 lb

## 2017-03-23 DIAGNOSIS — I5022 Chronic systolic (congestive) heart failure: Secondary | ICD-10-CM | POA: Insufficient documentation

## 2017-03-23 DIAGNOSIS — Z8249 Family history of ischemic heart disease and other diseases of the circulatory system: Secondary | ICD-10-CM | POA: Diagnosis not present

## 2017-03-23 DIAGNOSIS — Z79899 Other long term (current) drug therapy: Secondary | ICD-10-CM | POA: Insufficient documentation

## 2017-03-23 DIAGNOSIS — Z7951 Long term (current) use of inhaled steroids: Secondary | ICD-10-CM | POA: Diagnosis not present

## 2017-03-23 DIAGNOSIS — I447 Left bundle-branch block, unspecified: Secondary | ICD-10-CM | POA: Diagnosis not present

## 2017-03-23 DIAGNOSIS — Z95810 Presence of automatic (implantable) cardiac defibrillator: Secondary | ICD-10-CM | POA: Diagnosis not present

## 2017-03-23 DIAGNOSIS — I428 Other cardiomyopathies: Secondary | ICD-10-CM | POA: Insufficient documentation

## 2017-03-23 DIAGNOSIS — Z7902 Long term (current) use of antithrombotics/antiplatelets: Secondary | ICD-10-CM | POA: Insufficient documentation

## 2017-03-23 DIAGNOSIS — E785 Hyperlipidemia, unspecified: Secondary | ICD-10-CM | POA: Diagnosis not present

## 2017-03-23 DIAGNOSIS — Z8673 Personal history of transient ischemic attack (TIA), and cerebral infarction without residual deficits: Secondary | ICD-10-CM | POA: Insufficient documentation

## 2017-03-23 LAB — LIPID PANEL
CHOLESTEROL: 126 mg/dL (ref 0–200)
HDL: 40 mg/dL — ABNORMAL LOW (ref >40)
LDL Cholesterol: 71 mg/dL (ref 0–99)
TRIGLYCERIDES: 77 mg/dL (ref <150)
Total CHOL/HDL Ratio: 3.2 RATIO
VLDL: 15 mg/dL (ref 0–40)

## 2017-03-23 LAB — BASIC METABOLIC PANEL WITH GFR
Anion gap: 9 (ref 5–15)
BUN: 10 mg/dL (ref 6–20)
CO2: 24 mmol/L (ref 22–32)
Calcium: 9.2 mg/dL (ref 8.9–10.3)
Chloride: 107 mmol/L (ref 101–111)
Creatinine, Ser: 0.73 mg/dL (ref 0.44–1.00)
GFR calc Af Amer: >60 mL/min
GFR calc non Af Amer: >60 mL/min
Glucose, Bld: 90 mg/dL (ref 65–99)
Potassium: 3.7 mmol/L (ref 3.5–5.1)
Sodium: 140 mmol/L (ref 135–145)

## 2017-03-23 MED ORDER — CARVEDILOL 12.5 MG PO TABS: 12.5000 mg | ORAL_TABLET | Freq: Two times a day (BID) | ORAL | 3 refills | Status: DC

## 2017-03-23 NOTE — Patient Instructions (Signed)
Increase 9.375 mg (1.5 tab), twice a day for 4 days  Then 12.5 mg (1 tab), twice a day (new Rx sent in)   Your physician has requested that you have an echocardiogram. Echocardiography is a painless test that uses sound waves to create images of your heart. It provides your doctor with information about the size and shape of your heart and how well your heart's chambers and valves are working. This procedure takes approximately one hour. There are no restrictions for this procedure.  They will call you  Labs drawn today (if we do not call you, then your lab work was stable)   Your physician recommends that you schedule a follow-up appointment in: 3 months with Dr. Shirlee Latch

## 2017-03-24 NOTE — Progress Notes (Signed)
Patient ID: [name], female   DOB: 26-Sep-1950, 67 y.o.   MRN: [id number] PCP: Dr [name] EP: Dr [name].  Cardiologist: Dr [name]  Neurologist: Dr [name]  HPI: [name] is a 67 y.o. female with chronic systolic CHF with nonischemic cardiomypathy (no significant CAD) s/p Medtronic CRT-D, LBBB, mild-mod MR and CVA (09/2013).  Coronary angiography in 7/15 showed no significant CAD.  Admitted in 9/15 with right sided facial droop and expressive aphasia, CVA on MRI. Had TEE showing EF 15%.    QRS narrowed, so in 11/16 device was reprogrammed to limit V-pacing.   Echo in 12/17 showed EF up to 45-50%.    She returns for followup of CHF.  Overall stable.  No dyspnea walking on flat ground.  Does not bendopnea.  No orthopnea/PND.  Main limitation is knee pain, walks with cane.  No chest pain.  No lightheadedness or palpitations.  Weight is up 10 lbs.  She reports eating poorly and less exercise this winter.    Medtronic device interrogated: Fluid index < threshold/stable impedance, no atrial fibrillation or VT.     PMH: 1. Nonischemic cardiomyopathy: Echo (7/15) with severe LV dilation, EF 25% with diffuse hypokinesis, mild to moderate MR.  RHC/ LHC 08/08/13 with RA 4, PA 23/8, PCWP 11, CI 2.52; no CAD.  TEE (9/15) with EF 15-20%, severe global hypokinesis, no LV thrombus, moderate MR, normal RV.  Echo (10/2013): EF 25-30%, grade I DD, mild AI. Medtronic CRT-D (10/15).  Echo (2/16) with EF 25-30%, diffuse hypokinesis worse in the septum, grade I diastolic dysfunction, normal RV size and systolic function, mild-moderate MR.  - Echo (8/16) with EF 35-40%, diffuse hypokinesis, mild-moderate mitral regurgitation.  - Echo (12/17): EF 45-50%, mild LVH, mild to moderate MR.  2. CVA: 9/15 with dysarthria.  Carotids 9/15 with no significant CAD.  - Carotid dopplers (10/17): mild plaque, normal subclavians.  3. LBBB: Medtronic CRT-D (10/2013) 4. Hyperlipidemia 5. Cholelithiasis  Labs 09/23/13 K 5.1  Creatinine 0.68 Cholesterol 166 TGl 83 HDL 41, LDL 108 10/07/13: K 3.9, creatinine 0.65, LDL 108 11/03/13: K 3.8, creatinine 0.8 11/20/13: K 3.9 Creatinine 0.66  1/16: K 3.6, creatinine 0.77 04/08/2014: K 4.2 Creatinine 0.73 5/16: K 4, creatinine 0.72 8/16: LDL 79, HDL 44 9/16: K 3.8, creatinine 0.76 1/17: Hgb 12.5 11/17: K 3.7, creatinine 0.71, LDL 90 1/18: LDL 77, HDl 40, AST normal, ALT 56, alkaline phosphatase 204 12/18: K 3.9, creatinine 0.79  SH: lives with her son and husband. Does not drink or smoke. Currently not working.   FH: Mom had A fib and Heart Failure. Deceased 76        Sister has lupus  ROS: All systems negative except as listed in HPI, PMH and Problem List.   Current Outpatient Medications  Medication Sig Dispense Refill  . atorvastatin (LIPITOR) 40 MG tablet Take 1 tablet (40 mg total) by mouth daily at 6 PM. 90 tablet 1  . carvedilol (COREG) 12.5 MG tablet Take 1 tablet (12.5 mg total) by mouth 2 (two) times daily with a meal. 60 tablet 3  . clopidogrel (PLAVIX) 75 MG tablet Take 1 tablet (75 mg total) by mouth daily. 90 tablet 3  . fluticasone (FLONASE) 50 MCG/ACT nasal spray Place 2 sprays into both nostrils daily. 16 g 0  . furosemide (LASIX) 40 MG tablet Take 1 tablet (40 mg total) by mouth daily. 90 tablet 1  . potassium chloride SA (K-DUR,KLOR-CON) 20 MEQ tablet Take 1 tablet (20 mEq total)  by mouth daily. 90 tablet 3  . ranitidine (ZANTAC) 150 MG tablet Take 1 tablet (150 mg total) by mouth 2 (two) times daily. 60 tablet 3  . sacubitril-valsartan (ENTRESTO) 24-26 MG Take 1 tablet by mouth 2 (two) times daily. 180 tablet 3   No current facility-administered medications for this encounter.     Vitals:   03/23/17 0913  BP: (!) 109/59  Pulse: 71  SpO2: 100%  Weight: 181 lb 8 oz (82.3 kg)    PHYSICAL EXAM: General: NAD Neck: No JVD, no thyromegaly or thyroid nodule.  Lungs: Clear to auscultation bilaterally with normal respiratory effort. CV:  Nondisplaced PMI.  Heart regular S1/S2, no S3/S4, no murmur.  Trace ankle edema.  No carotid bruit.  Normal pedal pulses.  Abdomen: Soft, nontender, no hepatosplenomegaly, no distention.  Skin: Intact without lesions or rashes.  Neurologic: Alert and oriented x 3.  Psych: Normal affect. Extremities: No clubbing or cyanosis.  HEENT: Normal.   ASSESSMENT & PLAN:  1. Chronic Systolic Heart Failure: Nonischemic cardiomyopathy s/p CRT-D in 10/15, coronary angiography without CAD in 07/2013. Echo done in 2/16, EF 25-30%.  Repeat echo in 8/16 with EF up to 35-40%.  Finally, echo in 12/17 showed EF up to 45-50%.  NYHA class II. She is not volume overloaded by exam or Optivol. - Continue Lasix 40 mg daily. BMET today.  - Increase Coreg to 9.375 mg bid x 4 days then increase to 12.5 mg bid after that.   - Continue Entresto 24/26 bid.    - QRS now narrowed, device has been reprogrammed to limit v-pacing. - She did not tolerate spironolactone in the past.  2. CVA: ?Embolic.  No atrial fibrillation has been detected so far on device.  She will continue Plavix for now. If future device interrogation shows atrial fibrillation, she will need to be anticoagulated.   3. Hyperlipidemia: Goal LDL< 70 with h/o CVA.  Check lipids today.   Followup in 3 months.   [name] 03/24/2017

## 2017-03-30 ENCOUNTER — Other Ambulatory Visit: Payer: Self-pay

## 2017-03-30 ENCOUNTER — Ambulatory Visit (HOSPITAL_COMMUNITY): Payer: Medicare HMO | Attending: Cardiovascular Disease

## 2017-03-30 DIAGNOSIS — I080 Rheumatic disorders of both mitral and aortic valves: Secondary | ICD-10-CM | POA: Diagnosis not present

## 2017-03-30 DIAGNOSIS — I5022 Chronic systolic (congestive) heart failure: Secondary | ICD-10-CM | POA: Diagnosis not present

## 2017-03-30 DIAGNOSIS — I447 Left bundle-branch block, unspecified: Secondary | ICD-10-CM | POA: Insufficient documentation

## 2017-03-30 DIAGNOSIS — I639 Cerebral infarction, unspecified: Secondary | ICD-10-CM | POA: Insufficient documentation

## 2017-03-30 DIAGNOSIS — I429 Cardiomyopathy, unspecified: Secondary | ICD-10-CM | POA: Insufficient documentation

## 2017-04-05 ENCOUNTER — Ambulatory Visit (INDEPENDENT_AMBULATORY_CARE_PROVIDER_SITE_OTHER): Payer: Medicare HMO | Admitting: *Deleted

## 2017-04-05 DIAGNOSIS — I428 Other cardiomyopathies: Secondary | ICD-10-CM

## 2017-04-05 NOTE — Progress Notes (Signed)
Remote ICD transmission.   

## 2017-04-06 ENCOUNTER — Encounter: Payer: Self-pay | Admitting: Cardiology

## 2017-04-16 ENCOUNTER — Other Ambulatory Visit: Payer: Self-pay | Admitting: Family Medicine

## 2017-04-27 LAB — CUP PACEART REMOTE DEVICE CHECK
Battery Remaining Longevity: 61 mo
Battery Voltage: 2.98 V
Brady Statistic AP VP Percent: 0 %
Brady Statistic AS VP Percent: 0 %
Brady Statistic AS VS Percent: 100 %
Brady Statistic RA Percent Paced: 0 %
Date Time Interrogation Session: 20190321103424
HIGH POWER IMPEDANCE MEASURED VALUE: 98 Ohm
Implantable Lead Implant Date: 20151021
Implantable Lead Implant Date: 20151021
Implantable Lead Implant Date: 20151021
Implantable Lead Location: 753858
Implantable Lead Location: 753859
Implantable Lead Model: 4298
Lead Channel Impedance Value: 456 Ohm
Lead Channel Impedance Value: 475 Ohm
Lead Channel Impedance Value: 513 Ohm
Lead Channel Impedance Value: 589 Ohm
Lead Channel Impedance Value: 665 Ohm
Lead Channel Impedance Value: 760 Ohm
Lead Channel Impedance Value: 779 Ohm
Lead Channel Impedance Value: 817 Ohm
Lead Channel Pacing Threshold Amplitude: 0.375 V
Lead Channel Pacing Threshold Amplitude: 0.625 V
Lead Channel Pacing Threshold Amplitude: 1.125 V
Lead Channel Pacing Threshold Pulse Width: 0.4 ms
Lead Channel Sensing Intrinsic Amplitude: 3.75 mV
Lead Channel Sensing Intrinsic Amplitude: 3.75 mV
Lead Channel Setting Pacing Pulse Width: 0.4 ms
Lead Channel Setting Sensing Sensitivity: 0.3 mV
MDC IDC LEAD LOCATION: 753860
MDC IDC MSMT LEADCHNL LV IMPEDANCE VALUE: 361 Ohm
MDC IDC MSMT LEADCHNL LV IMPEDANCE VALUE: 399 Ohm
MDC IDC MSMT LEADCHNL LV IMPEDANCE VALUE: 608 Ohm
MDC IDC MSMT LEADCHNL LV IMPEDANCE VALUE: 665 Ohm
MDC IDC MSMT LEADCHNL LV PACING THRESHOLD PULSEWIDTH: 0.5 ms
MDC IDC MSMT LEADCHNL RA PACING THRESHOLD PULSEWIDTH: 0.4 ms
MDC IDC MSMT LEADCHNL RV IMPEDANCE VALUE: 532 Ohm
MDC IDC MSMT LEADCHNL RV SENSING INTR AMPL: 9.625 mV
MDC IDC MSMT LEADCHNL RV SENSING INTR AMPL: 9.625 mV
MDC IDC PG IMPLANT DT: 20151021
MDC IDC SET LEADCHNL RV PACING AMPLITUDE: 2 V
MDC IDC STAT BRADY AP VS PERCENT: 0 %
MDC IDC STAT BRADY RV PERCENT PACED: 0.01 %

## 2017-05-10 ENCOUNTER — Other Ambulatory Visit: Payer: Self-pay

## 2017-05-10 MED ORDER — CARVEDILOL 12.5 MG PO TABS: 12.5000 mg | ORAL_TABLET | Freq: Two times a day (BID) | ORAL | 3 refills | Status: DC

## 2017-05-10 NOTE — Telephone Encounter (Signed)
[facility] pharmacy calling for refill, pt needs carvedilol. [facility] [phone] Shawna Orleans, RN

## 2017-05-17 ENCOUNTER — Telehealth: Payer: Self-pay

## 2017-05-17 NOTE — Telephone Encounter (Signed)
Patient called nurse line requesting refill on Carvedilol 6.25 mg. Advised patient that Carvedilol 12.5 mg had been refilled on 05/10/17 and reminded her that cardiology had increased dose on 03/23/17.  Patient stated her BP has been running 110/60s and she does not want to increase her Carvedilol because she is afraid if her BP is lower she will pass out. Informed I will route note to PCP.  Her call back is [phone].  Ples Specter, RN [facility] RN)

## 2017-05-23 NOTE — Telephone Encounter (Signed)
Call patient, no answer. Left voicemail

## 2017-05-25 ENCOUNTER — Telehealth (HOSPITAL_COMMUNITY): Payer: Self-pay | Admitting: *Deleted

## 2017-05-25 NOTE — Telephone Encounter (Signed)
Advanced Heart Failure Triage Encounter  Patient Name: [name]  Date of Call: 05/25/17  Problem:  Patient called stating she was not increasing her Carvedilol to 12.5 mg Twice Daily because her BP was low at 110/60.  After reading her last office note she was instructed to take 9.375 mg Twice Daily for 4 days and then increase it to 12.5 mg twice daily.  Patient stated she was never instructed to do that.    Plan:  I explained to patient that 110/60 was a normal BP reading and she needs to take 9.375 mg Twice Daily per Dr. Alford Highland last office note and to call us back next week with BP readings.  Patient verbalized understanding and no further questions.   Georgina Peer, RN

## 2017-05-28 ENCOUNTER — Other Ambulatory Visit: Payer: Self-pay

## 2017-05-28 MED ORDER — CLOPIDOGREL BISULFATE 75 MG PO TABS: 75.0000 mg | ORAL_TABLET | Freq: Every day | ORAL | 3 refills | Status: DC

## 2017-05-28 MED ORDER — ATORVASTATIN CALCIUM 40 MG PO TABS: 40.0000 mg | ORAL_TABLET | Freq: Every day | ORAL | 1 refills | Status: DC

## 2017-05-28 MED ORDER — POTASSIUM CHLORIDE CRYS ER 20 MEQ PO TBCR: 20.0000 meq | EXTENDED_RELEASE_TABLET | Freq: Every day | ORAL | 3 refills | Status: DC

## 2017-05-28 MED ORDER — FUROSEMIDE 40 MG PO TABS: 40.0000 mg | ORAL_TABLET | Freq: Every day | ORAL | 1 refills | Status: DC

## 2017-06-08 ENCOUNTER — Encounter: Payer: Self-pay | Admitting: Family Medicine

## 2017-06-08 ENCOUNTER — Other Ambulatory Visit: Payer: Self-pay

## 2017-06-08 ENCOUNTER — Ambulatory Visit (INDEPENDENT_AMBULATORY_CARE_PROVIDER_SITE_OTHER): Payer: Medicare HMO | Admitting: Family Medicine

## 2017-06-08 DIAGNOSIS — R252 Cramp and spasm: Secondary | ICD-10-CM

## 2017-06-08 DIAGNOSIS — H6591 Unspecified nonsuppurative otitis media, right ear: Secondary | ICD-10-CM

## 2017-06-08 MED ORDER — TETANUS-DIPHTH-ACELL PERTUSSIS 5-2-15.5 LF-MCG/0.5 IM SUSP: 0.5000 mL | Freq: Once | INTRAMUSCULAR | 0 refills | Status: AC

## 2017-06-08 NOTE — Progress Notes (Signed)
Subjective:    Patient ID: [name] , female   DOB: 05-May-1950 , 67 y.o..   MRN: [id number]  HPI  [name] is a 67 yo F with past medical history of systolic CHF, left bundle branch block, hypertension, GERD hyperlipidemia, stroke here for  Chief Complaint  Patient presents with  . cramps in feet  . left ear "stopped up"    1. Left ear fullness: Patient notes that for the last month she feels like there is water in her left ear.  She feels a fullness sensation.  She denies any trouble hearing or ear discharge.  Denies any ringing of the ears or ear pain.  Unsure if she has had balance issues.  She notes that she does have sinus issues and she uses Flonase which does help.  She has constant clear sinus drainage.  Is not on any antihistamines.  2. Cramps in feet: Patient admits to bilateral cramps in her feet that have been ongoing for several months.  She notes that the pain is worse at night and when ambulating.  She has tried Tylenol but this does not help her.  She is also tried icy hot daily.  She does have a history of right ankle fracture and surgical repair.  Other than that no recent trauma.  Review of Systems: Per HPI  Past Medical History: Patient Active Problem List   Diagnosis Date Noted  . Non-suppurative otitis media 06/13/2017  . Cramping of feet 06/13/2017  . Elevated alkaline phosphatase measurement 04/24/2016  . Left acute serous otitis media 04/13/2016  . Colonoscopy refused 09/20/2015  . Left knee pain 02/15/2015  . Allergic rhinitis 11/09/2014  . Cardiac LV ejection fraction 21-40% 12/03/2013  . Cerebral infarction due to embolism of cerebral artery (HCC) 12/03/2013  . Chronic systolic congestive heart failure, NYHA class 2 (HCC) 11/05/2013  . GERD (gastroesophageal reflux disease)   . HLD (hyperlipidemia)   . Chronic hypotension 09/25/2013  . Non-ischemic cardiomyopathy (HCC) 08/11/2013  . Mitral regurgitation   . Acute systolic CHF  (congestive heart failure), NYHA class 4 (HCC) 08/08/2013  . Left bundle branch block 08/07/2013    Medications: reviewed   Social Hx:  reports that she has never smoked. She has never used smokeless tobacco.   Objective:   BP 110/62   Pulse 68   Temp 98.2 F (36.8 C) (Oral)   Ht  (1.651 m)   Wt 177 lb (80.3 kg)   SpO2 98%   BMI 29.45 kg/m  Physical Exam  Gen: NAD, alert, cooperative with exam, well-appearing HEENT: NCAT, PERRL, clear conjunctiva, oropharynx clear, supple neck, right TM dull with fluid behind but no pus no erythema.  Left TM normal  Bilateral Ankle & Foot: No visible swelling, ecchymosis, erythema, ulcers, calluses, blister Arch: Normal w/o pes cavus or planus  Toes: no claw or hammer deformity  No pain at MT heads No pain at base of 5th MT; No tenderness over cuboid; No tenderness over N spot or navicular prominence No tenderness on lateral and medial malleolus No sign of peroneal tendon subluxations or tenderness to palpation Full in plantarflexion, dorsiflexion, inversion, and eversion of the foot; flexion and extension of the toes Strength: 5/5 in all directions. Sensation: intact Vascular: intact w/ dorsalis pedis & posterior tibialis pulses 2+ Stable lateral and medial ligaments; Negative Anterior drawer test Able to walk 4 steps  Elicits tenderness when midfoot is flexed on bilateral feet  Assessment & Plan:  Non-suppurative otitis media Signs of fluid behind the ear on right ear but no infection.  Suspect that sinus issues are contributing. - Continue daily Flonase use -Recommended over-the-counter Zyrtec to help with sinus issues and this may also help with her feelings of ear fullness in the right ear - Discouraged Q-tip use  Cramping of feet Unclear etiology at this time.  Exam largely normal but she does elicit tenderness when the midfoot is flexed bilaterally.  Suspect that she may have some degree of osteoarthritis in both of her  feet.  No signs of gout or infection or fracture. - No NSAIDs due to cardiac issues - Continue Tylenol, recommended arthritis dose 650 mg every 8 hours as needed - Continue IcyHot use -Encouraged hydration with water - Advised wearing stable sneakers with comfortable sole -If symptoms worsen can consider referral to sports medicine as she may customize need shoe inserts  Anders Simmonds, MD [facility], PGY-3

## 2017-06-08 NOTE — Progress Notes (Signed)
Cramps in

## 2017-06-08 NOTE — Patient Instructions (Signed)
Thank you for coming in today, it was so nice to see you! Today we talked about:    Feet pain: This is likely metatarsalgia from osteoarthritis. Try Tylenol arthritis BID (650 mg BID) and continue the icy hot. We can try physical therapy in the future if it becomes more uncontrolled  Flonase and zyrtec for congestion and allergies  Please follow up in 1 month. You can schedule this appointment at the front desk before you leave or call the clinic.  Bring in all your medications or supplements to each appointment for review.   If we ordered any tests today, you will be notified via telephone of any abnormalities. If everything is normal you will get a letter in the mail.   If you have any questions or concerns, please do not hesitate to call the office at [phone]. You can also message me directly via MyChart.   Sincerely,  Anders Simmonds, MD

## 2017-06-13 DIAGNOSIS — R252 Cramp and spasm: Secondary | ICD-10-CM | POA: Insufficient documentation

## 2017-06-13 DIAGNOSIS — H6590 Unspecified nonsuppurative otitis media, unspecified ear: Secondary | ICD-10-CM | POA: Insufficient documentation

## 2017-06-13 NOTE — Assessment & Plan Note (Signed)
Unclear etiology at this time.  Exam largely normal but she does elicit tenderness when the midfoot is flexed bilaterally.  Suspect that she may have some degree of osteoarthritis in both of her feet.  No signs of gout or infection or fracture. - No NSAIDs due to cardiac issues - Continue Tylenol, recommended arthritis dose 650 mg every 8 hours as needed - Continue IcyHot use -Encouraged hydration with water - Advised wearing stable sneakers with comfortable sole -If symptoms worsen can consider referral to sports medicine as she may customize need shoe inserts

## 2017-06-13 NOTE — Assessment & Plan Note (Signed)
Signs of fluid behind the ear on right ear but no infection.  Suspect that sinus issues are contributing. - Continue daily Flonase use -Recommended over-the-counter Zyrtec to help with sinus issues and this may also help with her feelings of ear fullness in the right ear - Discouraged Q-tip use

## 2017-06-25 ENCOUNTER — Other Ambulatory Visit: Payer: Self-pay

## 2017-06-25 ENCOUNTER — Ambulatory Visit (HOSPITAL_COMMUNITY)
Admission: RE | Admit: 2017-06-25 | Discharge: 2017-06-25 | Disposition: A | Discharge status: Home / Self Care | Payer: Medicare HMO | Source: Ambulatory Visit | Attending: Cardiology | Admitting: Cardiology

## 2017-06-25 VITALS — BP 110/80 | HR 66 | Wt 180.4 lb

## 2017-06-25 DIAGNOSIS — I429 Cardiomyopathy, unspecified: Secondary | ICD-10-CM | POA: Insufficient documentation

## 2017-06-25 DIAGNOSIS — Z8673 Personal history of transient ischemic attack (TIA), and cerebral infarction without residual deficits: Secondary | ICD-10-CM | POA: Insufficient documentation

## 2017-06-25 DIAGNOSIS — Z79899 Other long term (current) drug therapy: Secondary | ICD-10-CM | POA: Insufficient documentation

## 2017-06-25 DIAGNOSIS — M79673 Pain in unspecified foot: Secondary | ICD-10-CM | POA: Diagnosis not present

## 2017-06-25 DIAGNOSIS — E785 Hyperlipidemia, unspecified: Secondary | ICD-10-CM | POA: Insufficient documentation

## 2017-06-25 DIAGNOSIS — Z7902 Long term (current) use of antithrombotics/antiplatelets: Secondary | ICD-10-CM | POA: Diagnosis not present

## 2017-06-25 DIAGNOSIS — M79605 Pain in left leg: Secondary | ICD-10-CM | POA: Diagnosis not present

## 2017-06-25 DIAGNOSIS — M79604 Pain in right leg: Secondary | ICD-10-CM | POA: Diagnosis not present

## 2017-06-25 DIAGNOSIS — Z8249 Family history of ischemic heart disease and other diseases of the circulatory system: Secondary | ICD-10-CM | POA: Diagnosis not present

## 2017-06-25 DIAGNOSIS — I5022 Chronic systolic (congestive) heart failure: Secondary | ICD-10-CM

## 2017-06-25 DIAGNOSIS — I447 Left bundle-branch block, unspecified: Secondary | ICD-10-CM | POA: Diagnosis not present

## 2017-06-25 LAB — BASIC METABOLIC PANEL
ANION GAP: 8 (ref 5–15)
BUN: 12 mg/dL (ref 6–20)
CALCIUM: 9.3 mg/dL (ref 8.9–10.3)
CHLORIDE: 109 mmol/L (ref 101–111)
CO2: 25 mmol/L (ref 22–32)
CREATININE: 0.77 mg/dL (ref 0.44–1.00)
GFR calc non Af Amer: >60 mL/min (ref >60)
Glucose, Bld: 84 mg/dL (ref 65–99)
Potassium: 3.5 mmol/L (ref 3.5–5.1)
SODIUM: 142 mmol/L (ref 135–145)

## 2017-06-25 NOTE — Patient Instructions (Signed)
Your physician has requested that you have an ankle brachial index (ABI). During this test an ultrasound and blood pressure cuff are used to evaluate the arteries that supply the arms and legs with blood. Allow thirty minutes for this exam. There are no restrictions or special instructions.  Labs drawn today (if we do not call you, then your lab work was stable)    Your physician recommends that you schedule a follow-up appointment in: 6 months with Dr. Shirlee Latch  Please Call an Schedule Appointment (Call in September)

## 2017-06-25 NOTE — Progress Notes (Signed)
Patient ID: [name], female   DOB: 1950-04-07, 67 y.o.   MRN: [id number] PCP: Dr Jonathon Jordan EP: Dr Graciela Husbands.  Cardiologist: Dr Shirlee Latch  Neurologist: Dr Pearlean Brownie  HPI: [name] is a 67 y.o. female with chronic systolic CHF with nonischemic cardiomypathy (no significant CAD) s/p Medtronic CRT-D, LBBB, mild-mod MR and CVA (09/2013).  Coronary angiography in 7/15 showed no significant CAD.  Admitted in 9/15 with right sided facial droop and expressive aphasia, CVA on MRI. Had TEE showing EF 15%.    QRS narrowed, so in 11/16 device was reprogrammed to limit V-pacing.   Echo in 12/17 showed EF up to 45-50%.  Echo in 3/19 showed EF 45-50% with moderate LVH and normal RV.   She returns for followup of CHF.  She seems to be doing well.  She did not tolerate increasing Coreg to 9.375 mg bid and is back to 12.5 mg bid.  No exertional dyspnea, she uses a cane to walk because of arthritis.  She has diffuse pain in her feet, mainly at night.  No chest pain.    Medtronic device interrogated: Fluid index < threshold/stable impedance, no AF or VT.   PMH: 1. Nonischemic cardiomyopathy: Echo (7/15) with severe LV dilation, EF 25% with diffuse hypokinesis, mild to moderate MR.  RHC/ LHC 08/08/13 with RA 4, PA 23/8, PCWP 11, CI 2.52; no CAD.  TEE (9/15) with EF 15-20%, severe global hypokinesis, no LV thrombus, moderate MR, normal RV.  Echo (10/2013): EF 25-30%, grade I DD, mild AI. Medtronic CRT-D (10/15).  Echo (2/16) with EF 25-30%, diffuse hypokinesis worse in the septum, grade I diastolic dysfunction, normal RV size and systolic function, mild-moderate MR.  - Echo (8/16) with EF 35-40%, diffuse hypokinesis, mild-moderate mitral regurgitation.  - Echo (12/17): EF 45-50%, mild LVH, mild to moderate MR.  - Echo (3/19): EF 45-50% with moderate LVH, mild MR, normal RV size and systolic function.  2. CVA: 9/15 with dysarthria.  Carotids 9/15 with no significant CAD.  - Carotid dopplers (10/17): mild plaque, normal  subclavians.  3. LBBB: Medtronic CRT-D (10/2013) 4. Hyperlipidemia 5. Cholelithiasis  Labs 09/23/13 K 5.1 Creatinine 0.68 Cholesterol 166 TGl 83 HDL 41, LDL 108 10/07/13: K 3.9, creatinine 0.65, LDL 108 11/03/13: K 3.8, creatinine 0.8 11/20/13: K 3.9 Creatinine 0.66  1/16: K 3.6, creatinine 0.77 04/08/2014: K 4.2 Creatinine 0.73 5/16: K 4, creatinine 0.72 8/16: LDL 79, HDL 44 9/16: K 3.8, creatinine 0.76 1/17: Hgb 12.5 11/17: K 3.7, creatinine 0.71, LDL 90 1/18: LDL 77, HDl 40, AST normal, ALT 56, alkaline phosphatase 204 12/18: K 3.9, creatinine 0.79 3/19: K 3.7, creatinine 0.73, LDL 71, HDL 40  SH: lives with her son and husband. Does not drink or smoke. Currently not working.   FH: [name] had A fib and Heart Failure. Deceased 73        [name] has lupus  ROS: All systems negative except as listed in HPI, PMH and Problem List.   Current Outpatient Medications  Medication Sig Dispense Refill  . atorvastatin (LIPITOR) 40 MG tablet Take 1 tablet (40 mg total) by mouth daily at 6 PM. 90 tablet 1  . carvedilol (COREG) 12.5 MG tablet Take 1 tablet (12.5 mg total) by mouth 2 (two) times daily with a meal. 60 tablet 3  . clopidogrel (PLAVIX) 75 MG tablet Take 1 tablet (75 mg total) by mouth daily. 90 tablet 3  . fluticasone (FLONASE) 50 MCG/ACT nasal spray Place 2 sprays into both nostrils daily **  120/4=30** 16 g 0  . furosemide (LASIX) 40 MG tablet Take 1 tablet (40 mg total) by mouth daily. 90 tablet 1  . potassium chloride SA (K-DUR,KLOR-CON) 20 MEQ tablet Take 1 tablet (20 mEq total) by mouth daily. 90 tablet 3  . ranitidine (ZANTAC) 150 MG tablet Take 1 tablet (150 mg total) by mouth 2 (two) times daily. 60 tablet 3  . sacubitril-valsartan (ENTRESTO) 24-26 MG Take 1 tablet by mouth 2 (two) times daily. 180 tablet 3   No current facility-administered medications for this encounter.     Vitals:   06/25/17 0915  BP: 110/80  Pulse: 66  SpO2: 98%  Weight: 180 lb 6.4 oz (81.8 kg)     PHYSICAL EXAM: General: NAD Neck: No JVD, no thyromegaly or thyroid nodule.  Lungs: Clear to auscultation bilaterally with normal respiratory effort. CV: Nondisplaced PMI.  Heart regular S1/S2, no S3/S4, no murmur.  No peripheral edema.  No carotid bruit.  Unable to palpate pedal pulses.  Abdomen: Soft, nontender, no hepatosplenomegaly, no distention.  Skin: Intact without lesions or rashes.  Neurologic: Alert and oriented x 3.  Psych: Normal affect. Extremities: No clubbing or cyanosis.  HEENT: Normal.   ASSESSMENT & PLAN:  1. Chronic Systolic Heart Failure: Nonischemic cardiomyopathy s/p CRT-D in 10/15, coronary angiography without CAD in 07/2013. Echo done in 2/16, EF 25-30%.  Repeat echo in 8/16 with EF up to 35-40%.  Last echo in 4/19 showed EF up to 45-50%.  NYHA class II. She is not volume overloaded by exam or Optivol. - Continue Lasix 40 mg daily. BMET today.  - Continue Coreg 6.25 mg bid, she did not tolerate increase to 9.375 mg bid.   - Continue Entresto 24/26 bid.    - QRS now narrowed, device has been reprogrammed to limit v-pacing. - She did not tolerate spironolactone in the past.  2. CVA: ?Embolic.  No atrial fibrillation has been detected so far on device.  She will continue Plavix for now. If future device interrogation shows atrial fibrillation, she will need to be anticoagulated.   3. Hyperlipidemia: Good LDL in 3/19.  4. Foot pain: Atypical for PAD but unable to palpate pedal pulses. I will arrange for peripheral arterial dopplers.   Followup in 6 months.   [name] 06/25/2017

## 2017-07-04 ENCOUNTER — Other Ambulatory Visit (HOSPITAL_COMMUNITY): Payer: Self-pay | Admitting: Cardiology

## 2017-07-04 DIAGNOSIS — M79604 Pain in right leg: Secondary | ICD-10-CM

## 2017-07-04 DIAGNOSIS — R0989 Other specified symptoms and signs involving the circulatory and respiratory systems: Secondary | ICD-10-CM

## 2017-07-04 DIAGNOSIS — M79605 Pain in left leg: Principal | ICD-10-CM

## 2017-07-05 ENCOUNTER — Telehealth: Payer: Self-pay | Admitting: Cardiology

## 2017-07-05 ENCOUNTER — Ambulatory Visit (INDEPENDENT_AMBULATORY_CARE_PROVIDER_SITE_OTHER): Payer: Medicare HMO | Admitting: *Deleted

## 2017-07-05 DIAGNOSIS — I428 Other cardiomyopathies: Secondary | ICD-10-CM | POA: Diagnosis not present

## 2017-07-05 DIAGNOSIS — I5022 Chronic systolic (congestive) heart failure: Secondary | ICD-10-CM

## 2017-07-05 NOTE — Telephone Encounter (Signed)
Spoke with pt and reminded pt of remote transmission that is due today. Pt verbalized understanding.   

## 2017-07-06 LAB — CUP PACEART REMOTE DEVICE CHECK
Battery Voltage: 2.97 V
Brady Statistic AP VP Percent: 0 %
Brady Statistic AS VP Percent: 0.01 %
Brady Statistic RA Percent Paced: 0 %
HIGH POWER IMPEDANCE MEASURED VALUE: 83 Ohm
Implantable Lead Implant Date: 20151021
Implantable Lead Implant Date: 20151021
Implantable Lead Location: 753859
Implantable Lead Model: 4298
Lead Channel Impedance Value: 456 Ohm
Lead Channel Impedance Value: 456 Ohm
Lead Channel Impedance Value: 475 Ohm
Lead Channel Impedance Value: 532 Ohm
Lead Channel Impedance Value: 608 Ohm
Lead Channel Impedance Value: 608 Ohm
Lead Channel Impedance Value: 874 Ohm
Lead Channel Impedance Value: 874 Ohm
Lead Channel Impedance Value: 893 Ohm
Lead Channel Impedance Value: 988 Ohm
Lead Channel Pacing Threshold Amplitude: 0.375 V
Lead Channel Pacing Threshold Amplitude: 0.625 V
Lead Channel Pacing Threshold Amplitude: 1.125 V
Lead Channel Pacing Threshold Pulse Width: 0.4 ms
Lead Channel Pacing Threshold Pulse Width: 0.5 ms
Lead Channel Sensing Intrinsic Amplitude: 3.75 mV
Lead Channel Setting Pacing Amplitude: 2 V
MDC IDC LEAD IMPLANT DT: 20151021
MDC IDC LEAD LOCATION: 753858
MDC IDC LEAD LOCATION: 753860
MDC IDC MSMT BATTERY REMAINING LONGEVITY: 57 mo
MDC IDC MSMT LEADCHNL LV IMPEDANCE VALUE: 703 Ohm
MDC IDC MSMT LEADCHNL LV IMPEDANCE VALUE: 817 Ohm
MDC IDC MSMT LEADCHNL RA PACING THRESHOLD PULSEWIDTH: 0.4 ms
MDC IDC MSMT LEADCHNL RA SENSING INTR AMPL: 3.75 mV
MDC IDC MSMT LEADCHNL RV IMPEDANCE VALUE: 532 Ohm
MDC IDC MSMT LEADCHNL RV SENSING INTR AMPL: 10.25 mV
MDC IDC MSMT LEADCHNL RV SENSING INTR AMPL: 10.25 mV
MDC IDC PG IMPLANT DT: 20151021
MDC IDC SESS DTM: 20190620193609
MDC IDC SET LEADCHNL RV PACING PULSEWIDTH: 0.4 ms
MDC IDC SET LEADCHNL RV SENSING SENSITIVITY: 0.3 mV
MDC IDC STAT BRADY AP VS PERCENT: 0 %
MDC IDC STAT BRADY AS VS PERCENT: 99.99 %
MDC IDC STAT BRADY RV PERCENT PACED: 0.02 %

## 2017-07-06 NOTE — Progress Notes (Signed)
Remote ICD transmission.   

## 2017-07-09 ENCOUNTER — Encounter: Payer: Self-pay | Admitting: Family Medicine

## 2017-07-09 ENCOUNTER — Ambulatory Visit (INDEPENDENT_AMBULATORY_CARE_PROVIDER_SITE_OTHER): Payer: Medicare HMO | Admitting: Family Medicine

## 2017-07-09 ENCOUNTER — Other Ambulatory Visit: Payer: Self-pay

## 2017-07-09 VITALS — BP 96/60 | HR 71 | Temp 98.0°F | Ht 65.0 in | Wt 184.0 lb

## 2017-07-09 DIAGNOSIS — Z23 Encounter for immunization: Secondary | ICD-10-CM | POA: Diagnosis not present

## 2017-07-09 DIAGNOSIS — R252 Cramp and spasm: Secondary | ICD-10-CM

## 2017-07-09 DIAGNOSIS — S6991XA Unspecified injury of right wrist, hand and finger(s), initial encounter: Secondary | ICD-10-CM | POA: Diagnosis not present

## 2017-07-09 NOTE — Progress Notes (Signed)
Subjective:    Patient ID: [name] , female   DOB: 01/03/1951 , 67 y.o..   MRN: [id number]  HPI  [name] is a 67 yo F with past medical history of systolic CHF, left bundle branch block, hypertension, GERD hyperlipidemia, stroke here for   1. Hand injury: Patient notes that she was peeling potatoes and she sliced her finger with a metal potato peeler.  She notes that she has been recovering from redness, no longer having any discharge or bleeding from the lesion on the finger.  Has not had any fevers or finger swelling.  2. Cramps in feet: Continues to have cramping in both of her feet.  Ongoing for several months now.  Pain is worse at night and when ambulating well.  Continues to take Tylenol and IcyHot, this does provide some relief.  She has a history of a right ankle fracture and surgical repair.  Otherwise no recent trauma.  He was seen by her cardiologist last month who could not palpate her pulses in her feet, he ordered ABI studies.   Review of Systems: Per HPI  Past Medical History: Patient Active Problem List   Diagnosis Date Noted  . Non-suppurative otitis media 06/13/2017  . Cramping of feet 06/13/2017  . Elevated alkaline phosphatase measurement 04/24/2016  . Left acute serous otitis media 04/13/2016  . Colonoscopy refused 09/20/2015  . Left knee pain 02/15/2015  . Allergic rhinitis 11/09/2014  . Cardiac LV ejection fraction 21-40% 12/03/2013  . Cerebral infarction due to embolism of cerebral artery (HCC) 12/03/2013  . Chronic systolic congestive heart failure, NYHA class 2 (HCC) 11/05/2013  . GERD (gastroesophageal reflux disease)   . HLD (hyperlipidemia)   . Chronic hypotension 09/25/2013  . Non-ischemic cardiomyopathy (HCC) 08/11/2013  . Mitral regurgitation   . Acute systolic CHF (congestive heart failure), NYHA class 4 (HCC) 08/08/2013  . Left bundle branch block 08/07/2013    Medications: reviewed   Social Hx:  reports that she has never  smoked. She has never used smokeless tobacco.   Objective:   BP 96/60   Pulse 71   Temp 98 F (36.7 C) (Oral)   Ht 5\' 5"  (1.651 m)   Wt 184 lb (83.5 kg)   SpO2 98%   BMI 30.62 kg/m  Physical Exam  Gen: NAD, alert, cooperative with exam, well-appearing  Bilateral Ankle & Foot: No visible swelling, ecchymosis, erythema, ulcers, calluses, blister Arch: Normal w/o pes cavus or planus  Toes: no claw or hammer deformity  No pain at MT heads No pain at base of 5th MT; No tenderness over cuboid; No tenderness over N spot or navicular prominence No tenderness on lateral and medial malleolus No sign of peroneal tendon subluxations or tenderness to palpation Full in plantarflexion, dorsiflexion, inversion, and eversion of the foot; flexion and extension of the toes Strength: 5/5 in all directions. Sensation: intact Vascular: intact w/ dorsalis pedis & posterior tibialis pulses 1+ Stable lateral and medial ligaments; Negative Anterior drawer test Able to walk 4 steps  Elicits tenderness when midfoot is flexed on bilateral feet  Assessment & Plan:   1. Cramping/pain of feet: Likely arthritis of the feet based off patient's history and exam.  I do not feel it is unreasonable to get an ABI as her DP pulses are palpable but faint bilaterally, this already been ordered by her cardiologist. -Would benefit from seeing sports medicine she may need orthotic shoe inserts - Ambulatory referral to Sports Medicine  2. Vaccine for diphtheria-tetanus-pertussis, combined: Vaccine ordered after placing her finger open with a metal potato peeler - Tdap vaccine greater than or equal to 7yo IM  Danielle Simmonds, MD [facility], PGY-3

## 2017-07-09 NOTE — Patient Instructions (Signed)
Thank you for coming in today, it was so nice to see you! Today we talked about:    You are getting a tetanus shot today  Please follow up in 1 month to meet new PCP. You can schedule this appointment at the front desk before you leave or call the clinic.  Bring in all your medications or supplements to each appointment for review.   If we ordered any tests today, you will be notified via telephone of any abnormalities. If everything is normal you will get a letter in the mail.   If you have any questions or concerns, please do not hesitate to call the office at [phone]. You can also message me directly via MyChart.   Sincerely,  Anders Simmonds, MD

## 2017-07-11 ENCOUNTER — Ambulatory Visit (HOSPITAL_COMMUNITY)
Admission: RE | Admit: 2017-07-11 | Discharge: 2017-07-11 | Disposition: A | Discharge status: Home / Self Care | Payer: Medicare HMO | Source: Ambulatory Visit | Attending: Cardiovascular Disease | Admitting: Cardiovascular Disease

## 2017-07-11 DIAGNOSIS — E785 Hyperlipidemia, unspecified: Secondary | ICD-10-CM | POA: Diagnosis not present

## 2017-07-11 DIAGNOSIS — R0989 Other specified symptoms and signs involving the circulatory and respiratory systems: Secondary | ICD-10-CM | POA: Diagnosis not present

## 2017-07-11 DIAGNOSIS — M79605 Pain in left leg: Secondary | ICD-10-CM | POA: Diagnosis not present

## 2017-07-11 DIAGNOSIS — M79604 Pain in right leg: Secondary | ICD-10-CM | POA: Insufficient documentation

## 2017-07-20 ENCOUNTER — Ambulatory Visit: Payer: Medicare HMO | Admitting: Family Medicine

## 2017-07-26 ENCOUNTER — Other Ambulatory Visit: Payer: Self-pay | Admitting: Cardiology

## 2017-07-26 DIAGNOSIS — I10 Essential (primary) hypertension: Secondary | ICD-10-CM | POA: Diagnosis not present

## 2017-07-26 DIAGNOSIS — E119 Type 2 diabetes mellitus without complications: Secondary | ICD-10-CM | POA: Diagnosis not present

## 2017-07-26 DIAGNOSIS — I639 Cerebral infarction, unspecified: Secondary | ICD-10-CM | POA: Diagnosis not present

## 2017-07-26 DIAGNOSIS — R2689 Other abnormalities of gait and mobility: Secondary | ICD-10-CM | POA: Diagnosis not present

## 2017-07-26 DIAGNOSIS — M8580 Other specified disorders of bone density and structure, unspecified site: Secondary | ICD-10-CM | POA: Diagnosis not present

## 2017-07-26 DIAGNOSIS — K219 Gastro-esophageal reflux disease without esophagitis: Secondary | ICD-10-CM | POA: Diagnosis not present

## 2017-07-26 DIAGNOSIS — I509 Heart failure, unspecified: Secondary | ICD-10-CM | POA: Diagnosis not present

## 2017-07-26 DIAGNOSIS — I5020 Unspecified systolic (congestive) heart failure: Secondary | ICD-10-CM | POA: Diagnosis not present

## 2017-08-03 ENCOUNTER — Other Ambulatory Visit: Payer: Self-pay

## 2017-08-03 ENCOUNTER — Encounter: Payer: Self-pay | Admitting: Family Medicine

## 2017-08-03 ENCOUNTER — Ambulatory Visit (INDEPENDENT_AMBULATORY_CARE_PROVIDER_SITE_OTHER): Payer: Medicare HMO | Admitting: Family Medicine

## 2017-08-03 VITALS — BP 100/64 | HR 66 | Temp 97.6°F | Ht 65.0 in | Wt 184.0 lb

## 2017-08-03 DIAGNOSIS — Z0000 Encounter for general adult medical examination without abnormal findings: Secondary | ICD-10-CM

## 2017-08-03 DIAGNOSIS — M25562 Pain in left knee: Secondary | ICD-10-CM

## 2017-08-03 DIAGNOSIS — G8929 Other chronic pain: Secondary | ICD-10-CM

## 2017-08-03 NOTE — Assessment & Plan Note (Signed)
Persistent knee pain which initiated after a fall onto her knee 2 years ago.  Knee exam showing tenderness for range of motion but no signs of any ligament or tendon tear.  There have been no imaging on knee since 2011.  Will plan to obtain 4 view x-ray of left knee to see severity of likely osteoarthritis.  Pending x-ray results will determine treatment.  Can consider referral to sports medicine or physical therapy.  She did says that she had been told years ago by orthopedics that she would need a knee replacement, but is not interested at this time.  Can consider referral to orthopedics pending imaging results and patient's desire for replacement. -4 view knee x-ray of left knee -Continue Tylenol as needed -Continue exercise -Follow-up as needed

## 2017-08-03 NOTE — Progress Notes (Signed)
   Subjective:    Patient ID: [name], female    DOB: 1950/06/11, 67 y.o.   MRN: [id number]   CC: left knee pain   HPI: Left knee pain Patient today presenting with left knee pain.  Says it is chronic and has been occurring for years (at least one year if not more).  Says that it makes it hard to get around.  Pain is worse when getting from seated to standing position or when walking upstairs.  Patient walks miles daily but it takes a long time to walk due to pain.  Patient had injury 2 years ago to knee which is when pain started.  Patient uses Tylenol arthritis strength but it does not help much.   Objective:  BP 100/64 (BP Location: Right Arm, Patient Position: Sitting, Cuff Size: Normal)   Pulse 66   Temp 97.6 F (36.4 C) (Oral)   Ht 5\' 5"  (1.651 m)   Wt 184 lb (83.5 kg)   SpO2 98%   BMI 30.62 kg/m  Vitals and nursing note reviewed  General: well nourished, in no acute distress HEENT: normocephalic, no scleral icterus or conjunctival pallor, no nasal discharge, moist mucous membranes Neck: supple, non-tender, without lymphadenopathy Cardiac: RRR, clear S1 and S2, no murmurs, rubs, or gallops Respiratory: clear to auscultation bilaterally, no increased work of breathing Abdomen: soft, nontender, nondistended, no masses or organomegaly. Bowel sounds present Extremities: no edema or cyanosis. Warm, well perfused.  Knees:  right knee WNL.  left knee: No redness, slight swelling noted on lateral side.  Good passive/active range of motion of knee without pain.  Internal and external hip rotation good without pain.  No tenderness at medial joint like, some tenderness to lateral joint line.  Unable to appreciate any joint effusion.  Anterior/posterior drawer tests, McMurray's, Lachmann's testing all negative.  Patello-femoral testing negative.      Skin: warm and dry, no rashes noted Neuro: alert and oriented, no focal deficits   Assessment & Plan:    Left knee  pain Persistent knee pain which initiated after a fall onto her knee 2 years ago.  Knee exam showing tenderness for range of motion but no signs of any ligament or tendon tear.  There have been no imaging on knee since 2011.  Will plan to obtain 4 view x-ray of left knee to see severity of likely osteoarthritis.  Pending x-ray results will determine treatment.  Can consider referral to sports medicine or physical therapy.  She did says that she had been told years ago by orthopedics that she would need a knee replacement, but is not interested at this time.  Can consider referral to orthopedics pending imaging results and patient's desire for replacement. -4 view knee x-ray of left knee -Continue Tylenol as needed -Continue exercise -Follow-up as needed  Healthcare maintenance Patient received mammogram and DEXA scan on 08/13/2017.  Awaiting results    Return if symptoms worsen or fail to improve.   Oralia Manis, DO, PGY-2

## 2017-08-03 NOTE — Patient Instructions (Signed)
It was a pleasure seeing you today.   Today we discussed your left knee pain  For your knee pain: I have ordered an xray to see if there is any joint damage  Please follow up if worsening pain or sooner if symptoms persist or worsen. Please call the clinic immediately if you have any concerns.   Our clinic's number is [phone]. Please call with questions or concerns.   Thank you,  Oralia Manis, DO

## 2017-08-03 NOTE — Assessment & Plan Note (Signed)
Patient received mammogram and DEXA scan on 08/13/2017.  Awaiting results

## 2017-08-06 DIAGNOSIS — Z1212 Encounter for screening for malignant neoplasm of rectum: Secondary | ICD-10-CM | POA: Diagnosis not present

## 2017-08-06 DIAGNOSIS — Z1211 Encounter for screening for malignant neoplasm of colon: Secondary | ICD-10-CM | POA: Diagnosis not present

## 2017-08-09 DIAGNOSIS — H524 Presbyopia: Secondary | ICD-10-CM | POA: Diagnosis not present

## 2017-08-09 DIAGNOSIS — J309 Allergic rhinitis, unspecified: Secondary | ICD-10-CM | POA: Diagnosis not present

## 2017-08-09 DIAGNOSIS — L820 Inflamed seborrheic keratosis: Secondary | ICD-10-CM | POA: Diagnosis not present

## 2017-08-09 DIAGNOSIS — I5020 Unspecified systolic (congestive) heart failure: Secondary | ICD-10-CM | POA: Diagnosis not present

## 2017-08-09 DIAGNOSIS — K219 Gastro-esophageal reflux disease without esophagitis: Secondary | ICD-10-CM | POA: Diagnosis not present

## 2017-08-09 DIAGNOSIS — F418 Other specified anxiety disorders: Secondary | ICD-10-CM | POA: Diagnosis not present

## 2017-08-09 DIAGNOSIS — L989 Disorder of the skin and subcutaneous tissue, unspecified: Secondary | ICD-10-CM | POA: Diagnosis not present

## 2017-08-10 ENCOUNTER — Ambulatory Visit
Admission: RE | Admit: 2017-08-10 | Discharge: 2017-08-10 | Disposition: A | Discharge status: Home / Self Care | Payer: Medicare HMO | Source: Ambulatory Visit | Attending: Family Medicine | Admitting: Family Medicine

## 2017-08-10 DIAGNOSIS — S8992XA Unspecified injury of left lower leg, initial encounter: Secondary | ICD-10-CM | POA: Diagnosis not present

## 2017-08-10 DIAGNOSIS — M25562 Pain in left knee: Principal | ICD-10-CM

## 2017-08-10 DIAGNOSIS — G8929 Other chronic pain: Secondary | ICD-10-CM

## 2017-08-13 ENCOUNTER — Telehealth: Payer: Self-pay

## 2017-08-13 ENCOUNTER — Encounter: Payer: Self-pay | Admitting: *Deleted

## 2017-08-13 DIAGNOSIS — Z803 Family history of malignant neoplasm of breast: Secondary | ICD-10-CM | POA: Diagnosis not present

## 2017-08-13 DIAGNOSIS — Z780 Asymptomatic menopausal state: Secondary | ICD-10-CM | POA: Diagnosis not present

## 2017-08-13 DIAGNOSIS — Z1231 Encounter for screening mammogram for malignant neoplasm of breast: Secondary | ICD-10-CM | POA: Diagnosis not present

## 2017-08-13 DIAGNOSIS — M8589 Other specified disorders of bone density and structure, multiple sites: Secondary | ICD-10-CM | POA: Diagnosis not present

## 2017-08-13 NOTE — Telephone Encounter (Signed)
Attempted to call patient to give results of Xray per Dr. Darin Engels. Still no answer and no voice mail.  Glennie Hawk, CMA

## 2017-08-13 NOTE — Telephone Encounter (Signed)
Patient returning call. [phone].  Ples Specter, RN [facility] RN)

## 2017-08-20 ENCOUNTER — Other Ambulatory Visit: Payer: Self-pay | Admitting: Family Medicine

## 2017-08-20 DIAGNOSIS — M1712 Unilateral primary osteoarthritis, left knee: Secondary | ICD-10-CM

## 2017-08-20 MED ORDER — TRAMADOL HCL 50 MG PO TABS: 25.0000 mg | ORAL_TABLET | Freq: Three times a day (TID) | ORAL | 0 refills | Status: DC | PRN

## 2017-08-20 NOTE — Progress Notes (Signed)
Patient called in saying pain is acutely worsening and Tylenol is not helping.  Patient is active and walks daily.  Give small amount of tramadol, 5 pills, to be used as needed to supplement Tylenol.  Have placed referral to physical therapy as this is what is likely to help improve pain long run.  Patient informed of plan via my chart.  Orpah Clinton, PGY-2 [facility] 08/20/2017 9:36 AM

## 2017-08-23 ENCOUNTER — Other Ambulatory Visit: Payer: Self-pay | Admitting: Family Medicine

## 2017-08-23 DIAGNOSIS — R922 Inconclusive mammogram: Secondary | ICD-10-CM | POA: Diagnosis not present

## 2017-08-23 DIAGNOSIS — R921 Mammographic calcification found on diagnostic imaging of breast: Secondary | ICD-10-CM | POA: Diagnosis not present

## 2017-08-27 ENCOUNTER — Other Ambulatory Visit: Payer: Self-pay | Admitting: Family Medicine

## 2017-08-28 DIAGNOSIS — M25561 Pain in right knee: Secondary | ICD-10-CM | POA: Diagnosis not present

## 2017-08-28 DIAGNOSIS — R296 Repeated falls: Secondary | ICD-10-CM | POA: Diagnosis not present

## 2017-08-28 DIAGNOSIS — R2689 Other abnormalities of gait and mobility: Secondary | ICD-10-CM | POA: Diagnosis not present

## 2017-08-28 DIAGNOSIS — I5020 Unspecified systolic (congestive) heart failure: Secondary | ICD-10-CM | POA: Diagnosis not present

## 2017-08-28 DIAGNOSIS — K219 Gastro-esophageal reflux disease without esophagitis: Secondary | ICD-10-CM | POA: Diagnosis not present

## 2017-08-28 DIAGNOSIS — M8580 Other specified disorders of bone density and structure, unspecified site: Secondary | ICD-10-CM | POA: Diagnosis not present

## 2017-09-13 ENCOUNTER — Other Ambulatory Visit: Payer: Self-pay | Admitting: Family Medicine

## 2017-09-27 ENCOUNTER — Other Ambulatory Visit: Payer: Self-pay | Admitting: Family Medicine

## 2017-09-28 DIAGNOSIS — E559 Vitamin D deficiency, unspecified: Secondary | ICD-10-CM | POA: Diagnosis not present

## 2017-09-28 DIAGNOSIS — Z23 Encounter for immunization: Secondary | ICD-10-CM | POA: Diagnosis not present

## 2017-09-28 DIAGNOSIS — I639 Cerebral infarction, unspecified: Secondary | ICD-10-CM | POA: Diagnosis not present

## 2017-09-28 DIAGNOSIS — I5020 Unspecified systolic (congestive) heart failure: Secondary | ICD-10-CM | POA: Diagnosis not present

## 2017-09-28 DIAGNOSIS — M25562 Pain in left knee: Secondary | ICD-10-CM | POA: Diagnosis not present

## 2017-10-04 ENCOUNTER — Ambulatory Visit (INDEPENDENT_AMBULATORY_CARE_PROVIDER_SITE_OTHER): Payer: Medicare HMO | Admitting: *Deleted

## 2017-10-04 ENCOUNTER — Telehealth: Payer: Self-pay | Admitting: Cardiology

## 2017-10-04 DIAGNOSIS — I428 Other cardiomyopathies: Secondary | ICD-10-CM

## 2017-10-04 DIAGNOSIS — I5022 Chronic systolic (congestive) heart failure: Secondary | ICD-10-CM

## 2017-10-04 NOTE — Telephone Encounter (Signed)
Spoke with pt and reminded pt of remote transmission that is due today. Pt verbalized understanding.   

## 2017-10-04 NOTE — Progress Notes (Signed)
Remote ICD transmission.   

## 2017-10-17 LAB — CUP PACEART REMOTE DEVICE CHECK
Battery Remaining Longevity: 55 mo
Brady Statistic AP VS Percent: 0 %
Brady Statistic RA Percent Paced: 0 %
Brady Statistic RV Percent Paced: 0.01 %
HIGH POWER IMPEDANCE MEASURED VALUE: 87 Ohm
Implantable Lead Implant Date: 20151021
Implantable Lead Location: 753860
Implantable Lead Model: 4298
Lead Channel Impedance Value: 1007 Ohm
Lead Channel Impedance Value: 1007 Ohm
Lead Channel Impedance Value: 418 Ohm
Lead Channel Impedance Value: 608 Ohm
Lead Channel Impedance Value: 608 Ohm
Lead Channel Impedance Value: 646 Ohm
Lead Channel Impedance Value: 760 Ohm
Lead Channel Impedance Value: 874 Ohm
Lead Channel Impedance Value: 950 Ohm
Lead Channel Pacing Threshold Amplitude: 0.625 V
Lead Channel Pacing Threshold Pulse Width: 0.4 ms
Lead Channel Pacing Threshold Pulse Width: 0.5 ms
Lead Channel Sensing Intrinsic Amplitude: 10.125 mV
Lead Channel Sensing Intrinsic Amplitude: 10.125 mV
Lead Channel Setting Pacing Amplitude: 2 V
Lead Channel Setting Pacing Pulse Width: 0.4 ms
MDC IDC LEAD IMPLANT DT: 20151021
MDC IDC LEAD IMPLANT DT: 20151021
MDC IDC LEAD LOCATION: 753858
MDC IDC LEAD LOCATION: 753859
MDC IDC MSMT BATTERY VOLTAGE: 2.97 V
MDC IDC MSMT LEADCHNL LV IMPEDANCE VALUE: 532 Ohm
MDC IDC MSMT LEADCHNL LV IMPEDANCE VALUE: 665 Ohm
MDC IDC MSMT LEADCHNL LV IMPEDANCE VALUE: 836 Ohm
MDC IDC MSMT LEADCHNL LV PACING THRESHOLD AMPLITUDE: 1.125 V
MDC IDC MSMT LEADCHNL RA PACING THRESHOLD AMPLITUDE: 0.375 V
MDC IDC MSMT LEADCHNL RA SENSING INTR AMPL: 3.875 mV
MDC IDC MSMT LEADCHNL RA SENSING INTR AMPL: 3.875 mV
MDC IDC MSMT LEADCHNL RV IMPEDANCE VALUE: 513 Ohm
MDC IDC MSMT LEADCHNL RV PACING THRESHOLD PULSEWIDTH: 0.4 ms
MDC IDC PG IMPLANT DT: 20151021
MDC IDC SESS DTM: 20190919153845
MDC IDC SET LEADCHNL RV SENSING SENSITIVITY: 0.3 mV
MDC IDC STAT BRADY AP VP PERCENT: 0 %
MDC IDC STAT BRADY AS VP PERCENT: 0 %
MDC IDC STAT BRADY AS VS PERCENT: 100 %

## 2017-10-19 ENCOUNTER — Other Ambulatory Visit: Payer: Self-pay

## 2017-10-19 ENCOUNTER — Ambulatory Visit (INDEPENDENT_AMBULATORY_CARE_PROVIDER_SITE_OTHER): Payer: Medicare HMO | Admitting: Family Medicine

## 2017-10-19 ENCOUNTER — Encounter: Payer: Self-pay | Admitting: Family Medicine

## 2017-10-19 VITALS — BP 92/68 | HR 69 | Temp 97.9°F | Ht 65.0 in | Wt 187.4 lb

## 2017-10-19 DIAGNOSIS — N951 Menopausal and female climacteric states: Secondary | ICD-10-CM | POA: Diagnosis not present

## 2017-10-19 DIAGNOSIS — I5022 Chronic systolic (congestive) heart failure: Secondary | ICD-10-CM

## 2017-10-19 DIAGNOSIS — R631 Polydipsia: Secondary | ICD-10-CM

## 2017-10-19 DIAGNOSIS — R232 Flushing: Secondary | ICD-10-CM | POA: Diagnosis not present

## 2017-10-19 DIAGNOSIS — Z23 Encounter for immunization: Secondary | ICD-10-CM

## 2017-10-19 DIAGNOSIS — I5020 Unspecified systolic (congestive) heart failure: Secondary | ICD-10-CM | POA: Insufficient documentation

## 2017-10-19 MED ORDER — CARVEDILOL 6.25 MG PO TABS: 6.2500 mg | ORAL_TABLET | Freq: Two times a day (BID) | ORAL | 3 refills | Status: DC

## 2017-10-19 NOTE — Patient Instructions (Signed)
   It was wonderful to see you today.  Thank you for choosing [facility].   Please call [phone] with any questions about today's appointment.  Please be sure to schedule follow up at the front  desk before you leave today.   Terisa Starr, MD  Family Medicine    Follow up in 1 month

## 2017-10-19 NOTE — Progress Notes (Signed)
  Patient Name: [name] Date of Birth: May 30, 1950 Date of Visit: 10/19/17 PCP: Westley Chandler, MD  Chief Complaint: hot flashes   Subjective: [name] is a pleasant 67 y.o. year old heart failure with reduced ejection fraction, nonischemic cardiomyopathy, cerebrovascular disease with residual ataxia mild aphasia and right facial droop and dyslipidemia presenting today to discuss hot flashes.  She is doing by her husband.  She reports overall she is doing well.  She reports that for the past 1 to 2 days she has had intermittent and "hot flashes".  She reports she has spells of these at times.  She denies weight loss, nausea, vomiting, dysuria, back pain, weight gain, shortness of breath, chest pain.  She reports these often come at times when the season changing it is her allergies are acting up.  She is worried she is developing diabetes as she has been excessively thirsty.  She denies polyuria.  Review of Systems  Constitutional: Negative for diaphoresis, fever, malaise/fatigue and weight loss.  HENT: Positive for congestion.   Eyes: Negative for blurred vision.  Respiratory: Negative for shortness of breath.   Cardiovascular: Negative for chest pain and palpitations.  Musculoskeletal: Negative for back pain.  Skin: Negative for rash.  Endo/Heme/Allergies:       Polydipsia     I have reviewed the patient's medical, surgical, family, and social history as appropriate.   History significant for heart failure with reduced ejection fraction, nonischemic cardiomyopathy, left bundle branch block.  She is status post a pacemaker with biventricular pacing and an implantable defibrillator.  She also has a history of cerebrovascular disease.  Social history she lives with her son Clide Cliff.  As well as her husband.  She was in the [place] War.  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  Vitals:   10/19/17 0841  BP: 92/68  Pulse: 69  Temp: 97.9 F (36.6 C)  SpO2: 97%   Filed Weights   10/19/17 0841  Weight: 187 lb 6.4 oz (85 kg)   HEENT: Sclera anicteric. Dentition is moderate. Appears well hydrated. Neck: Supple Cardiac: Regular rate and rhythm. Normal S1/S2. No murmurs, rubs, or gallops appreciated. Lungs: Clear bilaterally to ascultation.  Abdomen: Normoactive bowel sounds. No tenderness to deep or light palpation. No rebound or guarding.  Extremities: Warm, well perfused without edema.  Skin: Warm, dry Psych: Pleasant and appropriate   Davian was seen today for follow-up.  Diagnoses and all orders for this visit:  Cardiomyopathy, unspecified type (HCC) -     carvedilol (COREG) 6.25 MG tablet; Take 1 tablet (6.25 mg total) by mouth 2 (two) times daily with a meal.  Polydipsia likely due to medications as she is taking Lasix.  Her weight is stable.  We will test for diabetes mellitus as well as diabetes insipidus today. -     Hemoglobin A1c -     Basic Metabolic Panel  Hot flashes, entered menopause at age 35 she reports.  She has had no bleeding since this time.  No fevers, weight loss.  We will continue to monitor closely.  Differential does include thyroid disease. -     TSH  Need for immunization against influenza -     Flu Vaccine QUAD 36+ mos IM  Terisa Starr, MD  Family Medicine Teaching Service

## 2017-10-20 LAB — BASIC METABOLIC PANEL
BUN/Creatinine Ratio: 15 (ref 12–28)
BUN: 13 mg/dL (ref 8–27)
CALCIUM: 9.1 mg/dL (ref 8.7–10.3)
CHLORIDE: 103 mmol/L (ref 96–106)
CO2: 25 mmol/L (ref 20–29)
CREATININE: 0.89 mg/dL (ref 0.57–1.00)
GFR calc Af Amer: 77 mL/min/{1.73_m2} (ref >59)
GFR calc non Af Amer: 67 mL/min/{1.73_m2} (ref >59)
GLUCOSE: 108 mg/dL — AB (ref 65–99)
Potassium: 4.3 mmol/L (ref 3.5–5.2)
Sodium: 141 mmol/L (ref 134–144)

## 2017-10-20 LAB — HEMOGLOBIN A1C
ESTIMATED AVERAGE GLUCOSE: 105 mg/dL
Hgb A1c MFr Bld: 5.3 % (ref 4.8–5.6)

## 2017-10-20 LAB — TSH: TSH: 2.63 u[IU]/mL (ref 0.450–4.500)

## 2017-11-19 ENCOUNTER — Encounter: Payer: Self-pay | Admitting: Family Medicine

## 2017-11-19 ENCOUNTER — Ambulatory Visit (INDEPENDENT_AMBULATORY_CARE_PROVIDER_SITE_OTHER): Payer: Medicare HMO | Admitting: Family Medicine

## 2017-11-19 ENCOUNTER — Other Ambulatory Visit: Payer: Self-pay

## 2017-11-19 VITALS — BP 98/72 | HR 69 | Temp 98.0°F | Ht 65.0 in | Wt 192.6 lb

## 2017-11-19 DIAGNOSIS — J301 Allergic rhinitis due to pollen: Secondary | ICD-10-CM | POA: Diagnosis not present

## 2017-11-19 MED ORDER — AZELASTINE HCL 0.05 % OP SOLN: 1.0000 [drp] | Freq: Two times a day (BID) | OPHTHALMIC | 12 refills | Status: DC

## 2017-11-19 MED ORDER — LORATADINE 10 MG PO TABS
10.0000 mg | ORAL_TABLET | Freq: Every day | ORAL | 11 refills | Status: DC
Start: 2017-11-19 — End: 2018-09-16

## 2017-11-19 NOTE — Patient Instructions (Signed)
It was wonderful to see you today.  Thank you for choosing [facility].   Please call [phone] with any questions about today's appointment.  Please be sure to schedule follow up at the front  desk before you leave today.   Carina Brown, MD  Family Medicine    

## 2017-11-19 NOTE — Progress Notes (Signed)
  Patient Name: [name] Date of Birth: [birth year] Date of Visit: 11/19/17 PCP: Westley Chandler, MD  Chief Complaint:  Left eye itching, hot flashes   Subjective: [name] is a pleasant 67 y.o. year old with history of heart failure with reduced ejection fraction, cerebrovascular disease with residual gait instability and right-sided facial droop presenting today for routine follow-up.  She is joined by her husband.  She reports overall she is doing well.  Her last visit she complained of hot flashes.  These have improved.  Today she complains primarily of left eye watering.  This is been ongoing for 5 days.  This is a new problem.  She is tried over-the-counter medications for this without relief.  This is included Zyrtec as well as she tried her Flonase twice.  She has associated nasal congestion and some ear fullness.  She denies difficulty breathing, cough, chest pain, orthopnea, lower extremity edema or changes in her weight.    In terms of her heart failure the patient reports she is doing well.  Of note her weight is up 5 pounds on our scale today.  She reports on her home scale she is at her baseline.  She was weighed in her jacket today she reports.   ROS:  ROS Negative except for as above  I have reviewed the patient's medical, surgical, family, and social history as appropriate.   Vitals:   11/19/17 0827  BP: 98/72  Pulse: 69  Temp: 98 F (36.7 C)  SpO2: 98%   Filed Weights   11/19/17 0827  Weight: 192 lb 9.6 oz (87.4 kg)   HEENT: Sclera anicteric. Dentition is moderate. Appears well hydrated. Left eye with some mild injection.  The right eye is clear.  Bilateral TMs visualized no drainage.  No erythema in ear canal.  Oropharynx is benign there are no lesions or ulcers. Neck: Supple Cardiac: Regular rate and rhythm. Normal S1/S2. No murmurs, rubs, or gallops appreciated. Lungs: Clear bilaterally to ascultation.  Neurologic: At times makes errors in speech,  slow to respond to questions at times.  When able to respond she responds with appropriate answers and with full correct fluent speech    [name] was seen today for follow-up.  Diagnoses and all orders for this visit:  Seasonal allergic rhinitis due to pollen -     loratadine (CLARITIN) 10 MG tablet; Take 1 tablet (10 mg total) by mouth daily. -     azelastine (OPTIVAR) 0.05 % ophthalmic solution; Place 1 drop into the left eye 2 (two) times daily.  HCM  Up-to-date on her mammogram.  She reports she had a colonoscopy in 2011.  We will have to fully examine this.  There is also documentation that she declined a follow-up  Terisa Starr, MD  [facility] Medicine Teaching Service

## 2017-12-03 DIAGNOSIS — E559 Vitamin D deficiency, unspecified: Secondary | ICD-10-CM | POA: Diagnosis not present

## 2017-12-03 DIAGNOSIS — M8580 Other specified disorders of bone density and structure, unspecified site: Secondary | ICD-10-CM | POA: Diagnosis not present

## 2017-12-03 DIAGNOSIS — I5020 Unspecified systolic (congestive) heart failure: Secondary | ICD-10-CM | POA: Diagnosis not present

## 2017-12-03 DIAGNOSIS — I639 Cerebral infarction, unspecified: Secondary | ICD-10-CM | POA: Diagnosis not present

## 2017-12-29 ENCOUNTER — Other Ambulatory Visit: Payer: Self-pay | Admitting: Family Medicine

## 2018-01-03 ENCOUNTER — Ambulatory Visit (INDEPENDENT_AMBULATORY_CARE_PROVIDER_SITE_OTHER): Payer: Medicare HMO

## 2018-01-03 ENCOUNTER — Telehealth: Payer: Self-pay | Admitting: *Deleted

## 2018-01-03 DIAGNOSIS — R1013 Epigastric pain: Secondary | ICD-10-CM

## 2018-01-03 DIAGNOSIS — I428 Other cardiomyopathies: Secondary | ICD-10-CM

## 2018-01-03 DIAGNOSIS — I5022 Chronic systolic (congestive) heart failure: Secondary | ICD-10-CM

## 2018-01-03 MED ORDER — PANTOPRAZOLE SODIUM 20 MG PO TBEC: 20.0000 mg | DELAYED_RELEASE_TABLET | Freq: Every day | ORAL | 0 refills | Status: DC

## 2018-01-03 NOTE — Progress Notes (Signed)
Remote ICD transmission.   

## 2018-01-03 NOTE — Telephone Encounter (Signed)
Pt informed. Fleeger, Jessica Dawn, CMA  

## 2018-01-03 NOTE — Telephone Encounter (Signed)
I will send a month of pantoprazole--I would like to discuss further given the long term risks of PPI and superior side effect profile of H2 blockers.  Terisa Starr, MD  Family Medicine Teaching Service

## 2018-01-03 NOTE — Telephone Encounter (Signed)
Insurance is no longer covering ranitidine but will cover Protonix or Prilosec.  Pts husband is requesting one of these to be called in . Fleeger, Maryjo Rochester, CMA

## 2018-02-01 ENCOUNTER — Ambulatory Visit (INDEPENDENT_AMBULATORY_CARE_PROVIDER_SITE_OTHER): Payer: Medicare HMO | Admitting: Internal Medicine

## 2018-02-01 ENCOUNTER — Encounter: Payer: Self-pay | Admitting: Internal Medicine

## 2018-02-01 VITALS — BP 110/74 | HR 68 | Ht 65.0 in | Wt 192.0 lb

## 2018-02-01 DIAGNOSIS — I5022 Chronic systolic (congestive) heart failure: Secondary | ICD-10-CM

## 2018-02-01 DIAGNOSIS — Z95810 Presence of automatic (implantable) cardiac defibrillator: Secondary | ICD-10-CM

## 2018-02-01 DIAGNOSIS — I447 Left bundle-branch block, unspecified: Secondary | ICD-10-CM

## 2018-02-01 DIAGNOSIS — I428 Other cardiomyopathies: Secondary | ICD-10-CM

## 2018-02-01 LAB — CUP PACEART INCLINIC DEVICE CHECK
Battery Remaining Longevity: 50 mo
Brady Statistic AP VP Percent: 0 %
Brady Statistic AP VS Percent: 0 %
Brady Statistic AS VP Percent: 0 %
Brady Statistic AS VS Percent: 100 %
Date Time Interrogation Session: 20200117115000
HighPow Impedance: 85 Ohm
Implantable Lead Implant Date: 20151021
Implantable Lead Implant Date: 20151021
Implantable Lead Location: 753858
Implantable Lead Location: 753860
Lead Channel Impedance Value: 513 Ohm
Lead Channel Impedance Value: 589 Ohm
Lead Channel Impedance Value: 608 Ohm
Lead Channel Impedance Value: 646 Ohm
Lead Channel Impedance Value: 665 Ohm
Lead Channel Impedance Value: 779 Ohm
Lead Channel Impedance Value: 950 Ohm
Lead Channel Pacing Threshold Amplitude: 0.5 V
Lead Channel Pacing Threshold Amplitude: 1.125 V
Lead Channel Pacing Threshold Pulse Width: 0.4 ms
Lead Channel Pacing Threshold Pulse Width: 0.4 ms
Lead Channel Sensing Intrinsic Amplitude: 4.125 mV
Lead Channel Sensing Intrinsic Amplitude: 5.75 mV
MDC IDC LEAD IMPLANT DT: 20151021
MDC IDC LEAD LOCATION: 753859
MDC IDC MSMT BATTERY VOLTAGE: 2.97 V
MDC IDC MSMT LEADCHNL LV IMPEDANCE VALUE: 1064 Ohm
MDC IDC MSMT LEADCHNL LV IMPEDANCE VALUE: 475 Ohm
MDC IDC MSMT LEADCHNL LV IMPEDANCE VALUE: 874 Ohm
MDC IDC MSMT LEADCHNL LV IMPEDANCE VALUE: 988 Ohm
MDC IDC MSMT LEADCHNL LV IMPEDANCE VALUE: 988 Ohm
MDC IDC MSMT LEADCHNL LV PACING THRESHOLD PULSEWIDTH: 0.5 ms
MDC IDC MSMT LEADCHNL RA PACING THRESHOLD AMPLITUDE: 0.5 V
MDC IDC MSMT LEADCHNL RV IMPEDANCE VALUE: 513 Ohm
MDC IDC MSMT LEADCHNL RV SENSING INTR AMPL: 10 mV
MDC IDC MSMT LEADCHNL RV SENSING INTR AMPL: 14.25 mV
MDC IDC PG IMPLANT DT: 20151021
MDC IDC SET LEADCHNL RV PACING AMPLITUDE: 2 V
MDC IDC SET LEADCHNL RV PACING PULSEWIDTH: 0.4 ms
MDC IDC SET LEADCHNL RV SENSING SENSITIVITY: 0.3 mV
MDC IDC STAT BRADY RA PERCENT PACED: 0 %
MDC IDC STAT BRADY RV PERCENT PACED: 0.01 %

## 2018-02-01 MED ORDER — CARVEDILOL 6.25 MG PO TABS: 6.2500 mg | ORAL_TABLET | Freq: Two times a day (BID) | ORAL | 3 refills | Status: DC

## 2018-02-01 NOTE — Progress Notes (Signed)
Patient Care Team: Westley Chandler, MD as PCP - General (Family Medicine) [name], MD as Consulting Physician (Cardiology)   HPI  [name] is a 68 y.o. female Seen With Chief Complaint of   Medtronic CRT-D 2015 for NICM LBBB and CHF  She is S/p CVA without interval detection of atrial fibrillation.     July 2015. and was found to have an ejection fraction 20-25%. Catheterization demonstrated no coronary disease.  DATE TEST    7 /15 Echo   EF 20-25 %   12/17 Echo   EF 45-50 %   3/19 Echo  45-50%          Date Cr K  4/18 0.8 3.4  10/19  0.89 4.3   The patient denies chest pain,  nocturnal dyspnea, orthopnea or peripheral edema.  There have been no palpitations, lightheadedness or syncope. Chronic sob with modest exertion or bending  She walks with cane followng stroke  Past Medical History:  Diagnosis Date  . Arthritis   . Automatic implantable cardioverter-defibrillator in situ   . Cardiac resynchronization therapy defibrillator (CRT-D) in place    a. placed on 11/05/13.  [name] GERD (gastroesophageal reflux disease)   . HLD (hyperlipidemia)   . Hypotension   . LBBB (left bundle branch block)   . Mitral regurgitation    a. Mild-mod MR by echo 07/2013.  [name] PONV (postoperative nausea and vomiting)   . Stroke (HCC) 09/23/2013  . Systolic CHF (HCC)    a. Dx 07/2013 - due to NICM. Cath normal cors. EF 25%.    Past Surgical History:  Procedure Laterality Date  . BI-VENTRICULAR IMPLANTABLE CARDIOVERTER DEFIBRILLATOR N/A 11/05/2013   Procedure: BI-VENTRICULAR IMPLANTABLE CARDIOVERTER DEFIBRILLATOR  (CRT-D);  Surgeon: Duke Salvia, MD;  Location: [facility] C[facility] LAB;  Service: Cardiovascular;  Laterality: N/A;  . BI-VENTRICULAR IMPLANTABLE CARDIOVERTER DEFIBRILLATOR  (CRT-D)  11/05/2013  . CARDIAC CATHETERIZATION  07/2013  . KNEE ARTHROSCOPY Right   . LEFT AND RIGHT HEART CATHETERIZATION WITH CORONARY ANGIOGRAM N/A 08/08/2013   Procedure: LEFT AND RIGHT HEART  CATHETERIZATION WITH CORONARY ANGIOGRAM;  Surgeon: [name], MD;  Location: [facility] CATH LAB;  Service: Cardiovascular;  Laterality: N/A;  . TEE WITHOUT CARDIOVERSION N/A 09/26/2013   Procedure: TRANSESOPHAGEAL ECHOCARDIOGRAM (TEE);  Surgeon: [name], MD;  Location: [facility] ENDOSCOPY;  Service: Cardiovascular;  Laterality: N/A;  . TONSILLECTOMY    . WRIST ARTHROSCOPY  12/21/2010   Procedure: ARTHROSCOPY WRIST;  Surgeon: [name], MD;  Location: [facility];  Service: Orthopedics;  Laterality: Right;  right wrist, repair TFCC on right, open reconstruction ECU sheath    Current Outpatient Medications  Medication Sig Dispense Refill  . atorvastatin (LIPITOR) 40 MG tablet Take 1 tablet (40 mg total) by mouth daily at 6 PM. 90 tablet 1  . azelastine (OPTIVAR) 0.05 % ophthalmic solution Place 1 drop into the left eye 2 (two) times daily. 6 mL 12  . carvedilol (COREG) 6.25 MG tablet Take 1 tablet (6.25 mg total) by mouth 2 (two) times daily with a meal. 180 tablet 3  . clopidogrel (PLAVIX) 75 MG tablet Take 1 tablet (75 mg total) by mouth daily. 90 tablet 3  . ENTRESTO 24-26 MG TAKE ONE TABLET BY MOUTH TWICE DAILY 180 tablet 3  . fluticasone (FLONASE) 50 MCG/ACT nasal spray Place 2 sprays into both nostrils daily **120/4=30** 16 g 0  . furosemide (LASIX) 40 MG tablet Take 1 tablet (40  mg total) by mouth daily. 90 tablet 1  . loratadine (CLARITIN) 10 MG tablet Take 1 tablet (10 mg total) by mouth daily. 30 tablet 11  . pantoprazole (PROTONIX) 20 MG tablet Take 1 tablet (20 mg total) by mouth daily. 30 tablet 0  . potassium chloride SA (K-DUR,KLOR-CON) 20 MEQ tablet Take 1 tablet (20 mEq total) by mouth daily. 90 tablet 3   No current facility-administered medications for this visit.     Allergies  Allergen Reactions  . Onion Other (See Comments)    gas  . Tomato Other (See Comments)    reflux    Review of Systems negative except from HPI and PMH  Physical  Exam BP 110/74   Pulse 68   Ht 5\' 5"  (1.651 m)   Wt 192 lb (87.1 kg)   SpO2 98%   BMI 31.95 kg/m  Well developed and nourished in no acute distress HENT normal Neck supple with JVP-flat Clear Regular rate and rhythm, no murmurs or gallops Abd-soft with active BS No Clubbing cyanosis edema Skin-warm and dry A & Oriented  Walks with cane    ECG sinus @ 68 13/11/42  Assessment and  Plan   Nonischemic cardio myopathy  ICD-CRT Medtronic The patient's device was interrogated.  The information was reviewed. No changes were made in the programming.     Congestive heart failure-chronic systolic  Left bundle branch block intermittent  Prior stroke with residual weakness    Euvolemic continue current meds  Continue current meds  No questions

## 2018-02-01 NOTE — Patient Instructions (Signed)
Medication Instructions:  Your physician recommends that you continue on your current medications as directed. Please refer to the Current Medication list given to you today.  Labwork: None ordered.  Testing/Procedures: None ordered.  Follow-Up: Your physician recommends that you schedule a follow-up appointment in:   One Year with Dr Graciela Husbands  Remote monitoring is used to monitor your Pacemaker of ICD from home. This monitoring reduces the number of office visits required to check your device to one time per year. It allows [place] to keep an eye on the functioning of your device to ensure it is working properly. You are scheduled for a device check from home on 3/19. You may send your transmission at any time that day. If you have a wireless device, the transmission will be sent automatically. After your physician reviews your transmission, you will receive a postcard with your next transmission date.    Any Other Special Instructions Will Be Listed Below (If Applicable).     If you need a refill on your cardiac medications before your next appointment, please call your pharmacy.

## 2018-02-03 LAB — CUP PACEART REMOTE DEVICE CHECK
Battery Voltage: 2.96 V
Brady Statistic AP VP Percent: 0 %
Brady Statistic RA Percent Paced: 0 %
Brady Statistic RV Percent Paced: 0.01 %
HIGH POWER IMPEDANCE MEASURED VALUE: 83 Ohm
Implantable Lead Implant Date: 20151021
Implantable Lead Location: 753858
Implantable Lead Model: 4298
Implantable Pulse Generator Implant Date: 20151021
Lead Channel Impedance Value: 589 Ohm
Lead Channel Impedance Value: 665 Ohm
Lead Channel Impedance Value: 950 Ohm
Lead Channel Impedance Value: 950 Ohm
Lead Channel Pacing Threshold Amplitude: 0.375 V
Lead Channel Pacing Threshold Pulse Width: 0.4 ms
Lead Channel Sensing Intrinsic Amplitude: 3.875 mV
Lead Channel Sensing Intrinsic Amplitude: 3.875 mV
MDC IDC LEAD IMPLANT DT: 20151021
MDC IDC LEAD IMPLANT DT: 20151021
MDC IDC LEAD LOCATION: 753859
MDC IDC LEAD LOCATION: 753860
MDC IDC MSMT BATTERY REMAINING LONGEVITY: 52 mo
MDC IDC MSMT LEADCHNL LV IMPEDANCE VALUE: 475 Ohm
MDC IDC MSMT LEADCHNL LV IMPEDANCE VALUE: 513 Ohm
MDC IDC MSMT LEADCHNL LV IMPEDANCE VALUE: 646 Ohm
MDC IDC MSMT LEADCHNL LV IMPEDANCE VALUE: 722 Ohm
MDC IDC MSMT LEADCHNL LV IMPEDANCE VALUE: 836 Ohm
MDC IDC MSMT LEADCHNL LV IMPEDANCE VALUE: 931 Ohm
MDC IDC MSMT LEADCHNL LV IMPEDANCE VALUE: 988 Ohm
MDC IDC MSMT LEADCHNL LV PACING THRESHOLD AMPLITUDE: 1.125 V
MDC IDC MSMT LEADCHNL LV PACING THRESHOLD PULSEWIDTH: 0.5 ms
MDC IDC MSMT LEADCHNL RA PACING THRESHOLD PULSEWIDTH: 0.4 ms
MDC IDC MSMT LEADCHNL RV IMPEDANCE VALUE: 513 Ohm
MDC IDC MSMT LEADCHNL RV IMPEDANCE VALUE: 551 Ohm
MDC IDC MSMT LEADCHNL RV PACING THRESHOLD AMPLITUDE: 0.625 V
MDC IDC MSMT LEADCHNL RV SENSING INTR AMPL: 10 mV
MDC IDC MSMT LEADCHNL RV SENSING INTR AMPL: 10 mV
MDC IDC SESS DTM: 20191219150932
MDC IDC SET LEADCHNL RV PACING AMPLITUDE: 2 V
MDC IDC SET LEADCHNL RV PACING PULSEWIDTH: 0.4 ms
MDC IDC SET LEADCHNL RV SENSING SENSITIVITY: 0.3 mV
MDC IDC STAT BRADY AP VS PERCENT: 0 %
MDC IDC STAT BRADY AS VP PERCENT: 0 %
MDC IDC STAT BRADY AS VS PERCENT: 100 %

## 2018-02-28 ENCOUNTER — Ambulatory Visit: Payer: Self-pay | Admitting: Family Medicine

## 2018-04-04 ENCOUNTER — Other Ambulatory Visit: Payer: Self-pay

## 2018-04-04 ENCOUNTER — Ambulatory Visit (INDEPENDENT_AMBULATORY_CARE_PROVIDER_SITE_OTHER): Payer: Medicare HMO | Admitting: *Deleted

## 2018-04-04 DIAGNOSIS — I428 Other cardiomyopathies: Secondary | ICD-10-CM

## 2018-04-04 DIAGNOSIS — I5022 Chronic systolic (congestive) heart failure: Secondary | ICD-10-CM

## 2018-04-04 LAB — CUP PACEART REMOTE DEVICE CHECK
Battery Remaining Longevity: 45 mo
Battery Voltage: 2.96 V
Brady Statistic AS VP Percent: 0 %
Brady Statistic RA Percent Paced: 0 %
Brady Statistic RV Percent Paced: 0.01 %
Date Time Interrogation Session: 20200319130552
HIGH POWER IMPEDANCE MEASURED VALUE: 84 Ohm
Implantable Lead Implant Date: 20151021
Implantable Lead Implant Date: 20151021
Implantable Lead Location: 753859
Implantable Lead Model: 4298
Implantable Lead Model: 5076
Implantable Pulse Generator Implant Date: 20151021
Lead Channel Impedance Value: 1007 Ohm
Lead Channel Impedance Value: 475 Ohm
Lead Channel Impedance Value: 475 Ohm
Lead Channel Impedance Value: 589 Ohm
Lead Channel Impedance Value: 722 Ohm
Lead Channel Impedance Value: 817 Ohm
Lead Channel Impedance Value: 931 Ohm
Lead Channel Impedance Value: 988 Ohm
Lead Channel Pacing Threshold Amplitude: 0.375 V
Lead Channel Pacing Threshold Pulse Width: 0.4 ms
Lead Channel Pacing Threshold Pulse Width: 0.4 ms
Lead Channel Pacing Threshold Pulse Width: 0.5 ms
Lead Channel Sensing Intrinsic Amplitude: 12.5 mV
Lead Channel Sensing Intrinsic Amplitude: 12.5 mV
Lead Channel Sensing Intrinsic Amplitude: 5 mV
Lead Channel Setting Pacing Amplitude: 2 V
Lead Channel Setting Pacing Pulse Width: 0.4 ms
Lead Channel Setting Sensing Sensitivity: 0.3 mV
MDC IDC LEAD IMPLANT DT: 20151021
MDC IDC LEAD LOCATION: 753858
MDC IDC LEAD LOCATION: 753860
MDC IDC MSMT LEADCHNL LV IMPEDANCE VALUE: 456 Ohm
MDC IDC MSMT LEADCHNL LV IMPEDANCE VALUE: 608 Ohm
MDC IDC MSMT LEADCHNL LV IMPEDANCE VALUE: 703 Ohm
MDC IDC MSMT LEADCHNL LV IMPEDANCE VALUE: 931 Ohm
MDC IDC MSMT LEADCHNL LV PACING THRESHOLD AMPLITUDE: 1.125 V
MDC IDC MSMT LEADCHNL RA SENSING INTR AMPL: 5 mV
MDC IDC MSMT LEADCHNL RV IMPEDANCE VALUE: 551 Ohm
MDC IDC MSMT LEADCHNL RV PACING THRESHOLD AMPLITUDE: 0.75 V
MDC IDC STAT BRADY AP VP PERCENT: 0 %
MDC IDC STAT BRADY AP VS PERCENT: 0 %
MDC IDC STAT BRADY AS VS PERCENT: 100 %

## 2018-04-09 ENCOUNTER — Telehealth: Payer: Self-pay | Admitting: *Deleted

## 2018-04-09 NOTE — Telephone Encounter (Signed)
Attempted to call pt on both numbers and chart, no answer or voicemail set up. Will try again later. Deseree Bruna Potter, CMA

## 2018-04-09 NOTE — Telephone Encounter (Signed)
-----   Message from Westley Chandler, MD sent at 04/08/2018  6:00 PM EDT ----- Regarding: Reschedule Hi, Please call Ms. Fedie and help her to reschedule her visit for sometime in April (preferably late April). I am also happy to do a virtual visit for her in early April.  Terisa Starr, MD  Family Medicine Teaching Service

## 2018-04-09 NOTE — Telephone Encounter (Signed)
Tried multiple numbers from chart and none of them are working. Deseree Bruna Potter, CMA

## 2018-04-10 ENCOUNTER — Telehealth: Payer: Self-pay | Admitting: Family Medicine

## 2018-04-10 ENCOUNTER — Telehealth: Payer: Self-pay

## 2018-04-10 DIAGNOSIS — I6340 Cerebral infarction due to embolism of unspecified cerebral artery: Secondary | ICD-10-CM

## 2018-04-10 DIAGNOSIS — I428 Other cardiomyopathies: Secondary | ICD-10-CM

## 2018-04-10 DIAGNOSIS — R1013 Epigastric pain: Secondary | ICD-10-CM

## 2018-04-10 MED ORDER — POTASSIUM CHLORIDE CRYS ER 20 MEQ PO TBCR: 20.0000 meq | EXTENDED_RELEASE_TABLET | Freq: Every day | ORAL | 3 refills | Status: DC

## 2018-04-10 MED ORDER — CLOPIDOGREL BISULFATE 75 MG PO TABS: 75.0000 mg | ORAL_TABLET | Freq: Every day | ORAL | 3 refills | Status: DC

## 2018-04-10 MED ORDER — ATORVASTATIN CALCIUM 40 MG PO TABS: 40.0000 mg | ORAL_TABLET | Freq: Every day | ORAL | 1 refills | Status: DC

## 2018-04-10 MED ORDER — PANTOPRAZOLE SODIUM 20 MG PO TBEC: 20.0000 mg | DELAYED_RELEASE_TABLET | Freq: Every day | ORAL | 0 refills | Status: DC

## 2018-04-10 MED ORDER — FUROSEMIDE 40 MG PO TABS: 40.0000 mg | ORAL_TABLET | Freq: Every day | ORAL | 1 refills | Status: DC

## 2018-04-10 NOTE — Telephone Encounter (Addendum)
Received return call from patient.  Patient feels overall well.  She has an appointment scheduled this upcoming Monday.  She would like to reschedule this for a later date.  She does need refills on medications prescribed prescribed.  I reviewed reasons to call and return to care.  All questions were answered   Terisa Starr, MD  [facility] Medicine Teaching Service

## 2018-04-10 NOTE — Telephone Encounter (Signed)
Attempted to call patient to set up appointment per Dr. Manson Passey.  No answer and no voice mail.  Glennie Hawk, CMA

## 2018-04-12 NOTE — Progress Notes (Signed)
Remote ICD transmission.   

## 2018-04-15 ENCOUNTER — Ambulatory Visit: Payer: Medicare HMO | Admitting: Family Medicine

## 2018-06-24 ENCOUNTER — Encounter: Payer: Self-pay | Admitting: Family Medicine

## 2018-07-04 ENCOUNTER — Ambulatory Visit (INDEPENDENT_AMBULATORY_CARE_PROVIDER_SITE_OTHER): Payer: Medicare Other | Admitting: *Deleted

## 2018-07-04 DIAGNOSIS — I428 Other cardiomyopathies: Secondary | ICD-10-CM | POA: Diagnosis not present

## 2018-07-04 LAB — CUP PACEART REMOTE DEVICE CHECK
Battery Remaining Longevity: 41 mo
Battery Voltage: 2.97 V
Brady Statistic AP VP Percent: 0 %
Brady Statistic AP VS Percent: 0 %
Brady Statistic AS VP Percent: 0.01 %
Brady Statistic AS VS Percent: 99.99 %
Brady Statistic RA Percent Paced: 0 %
Brady Statistic RV Percent Paced: 0.01 %
Date Time Interrogation Session: 20200618112203
HighPow Impedance: 77 Ohm
Implantable Lead Implant Date: 20151021
Implantable Lead Implant Date: 20151021
Implantable Lead Implant Date: 20151021
Implantable Lead Location: 753858
Implantable Lead Location: 753859
Implantable Lead Location: 753860
Implantable Lead Model: 4298
Implantable Lead Model: 5076
Implantable Pulse Generator Implant Date: 20151021
Lead Channel Impedance Value: 456 Ohm
Lead Channel Impedance Value: 456 Ohm
Lead Channel Impedance Value: 456 Ohm
Lead Channel Impedance Value: 532 Ohm
Lead Channel Impedance Value: 551 Ohm
Lead Channel Impedance Value: 551 Ohm
Lead Channel Impedance Value: 608 Ohm
Lead Channel Impedance Value: 703 Ohm
Lead Channel Impedance Value: 779 Ohm
Lead Channel Impedance Value: 836 Ohm
Lead Channel Impedance Value: 893 Ohm
Lead Channel Impedance Value: 931 Ohm
Lead Channel Impedance Value: 950 Ohm
Lead Channel Pacing Threshold Amplitude: 0.625 V
Lead Channel Pacing Threshold Pulse Width: 0.4 ms
Lead Channel Sensing Intrinsic Amplitude: 10.125 mV
Lead Channel Sensing Intrinsic Amplitude: 4.875 mV
Lead Channel Setting Pacing Amplitude: 2 V
Lead Channel Setting Pacing Pulse Width: 0.4 ms
Lead Channel Setting Sensing Sensitivity: 0.3 mV

## 2018-07-09 NOTE — Progress Notes (Signed)
Remote ICD transmission.   

## 2018-07-12 ENCOUNTER — Encounter: Payer: Self-pay | Admitting: Family Medicine

## 2018-07-12 ENCOUNTER — Ambulatory Visit (INDEPENDENT_AMBULATORY_CARE_PROVIDER_SITE_OTHER): Payer: Medicare Other | Admitting: Family Medicine

## 2018-07-12 ENCOUNTER — Other Ambulatory Visit: Payer: Self-pay

## 2018-07-12 VITALS — BP 100/68 | HR 67

## 2018-07-12 DIAGNOSIS — I428 Other cardiomyopathies: Secondary | ICD-10-CM

## 2018-07-12 DIAGNOSIS — Z1239 Encounter for other screening for malignant neoplasm of breast: Secondary | ICD-10-CM

## 2018-07-12 DIAGNOSIS — M19072 Primary osteoarthritis, left ankle and foot: Secondary | ICD-10-CM | POA: Diagnosis not present

## 2018-07-12 DIAGNOSIS — R1013 Epigastric pain: Secondary | ICD-10-CM | POA: Diagnosis not present

## 2018-07-12 MED ORDER — PANTOPRAZOLE SODIUM 20 MG PO TBEC: 20.0000 mg | DELAYED_RELEASE_TABLET | Freq: Every day | ORAL | 3 refills | Status: DC

## 2018-07-12 MED ORDER — DICLOFENAC SODIUM 1 % TD GEL: 4.0000 g | Freq: Four times a day (QID) | TRANSDERMAL | 11 refills | Status: DC

## 2018-07-12 NOTE — Patient Instructions (Signed)
   It was wonderful to see you today.  Thank you for choosing [facility].   Please call [phone] with any questions about today's appointment.  Please be sure to schedule follow up at the front  desk before you leave today.   Dorris Singh, MD  Family Medicine   Go to the lab  Increase your flonase to TWICE daily  You can use the gel on your ankles and feet

## 2018-07-12 NOTE — Progress Notes (Signed)
Patient Name: [name] Date of Birth: Jan 27, 1950 Date of Visit: 07/12/18 PCP: Westley Chandler, MD  Chief Complaint: nasal congestion   Subjective: [name] is a pleasant 68 y.o. with medical history significant for heart failure with reduced ejection , nonischemic cardiomyopathy with ICD in place, cerebrovascular disease, mild cognitive impairment secondary to prior stroke presenting today for check-in and concerns about nasal congestion  [name] is alone for portion of the visit today.  Her husband joins her for the latter portion of the visit.  She reports that coronavirus has been incredibly stressful on her family.  Their son has had to reduce his work hours and subsequently suffered an injury to his distal phalanx on his fifth digit.  They have been able to afford her medications and food.  Transportation has been more challenging recently due to strengths.  Patient reports compliance with all of her medications.  She is not really sure what refill she needs her husband reports she thinks she needs all of them refilled.  She reports [name] is the most expensive of her medications at $8 a month.  This is become more difficult for them to afford recently.  The patient reports several months of nasal congestion.  She describes this as a mucus sensation in her nose.  She then when she lays down feels the mucus draining to her stomach which causes reflux symptoms.  Denies cough, dyspnea, dysphagia, odynophagia nausea or vomiting.  She is taking Flonase once a day and Zyrtec once a day.  She is on a PPI.  Patient reports bilateral foot pain worse at night after she is walked all day.  Denies numbness, tingling, burning, redness, edema in her lower extremities.  She has tried a topical medication that sounds like Aspercreme in the area without benefit.  ROS:  ROS negative for chest pain, shortness of breath, orthopnea, weight gain or PND.  The patient is pleased that she has lost 6  pounds.  She has been trying to eat a improved diet  I have reviewed the patient's medical, surgical, family, and social history as appropriate.   Vitals:   07/12/18 0849  BP: 100/68  Pulse: 67  SpO2: 97%   There were no vitals filed for this visit.  HEENT: Sclera anicteric. Dentition is moderate. Appears well hydrated. Neck: Supple Cardiac: Regular rate and rhythm. Normal S1/S2 Lungs: Clear bilaterally to ascultation.  Abdomen: Normoactive bowel sounds. No tenderness to deep or light palpation. No rebound or guarding.  Extremities: Warm, well perfused without edema.  Skin: Warm, dry Psych: Pleasant and appropriate   [name] was seen today for follow-up.  Diagnoses and all orders for this visit:  Non-ischemic cardiomyopathy (HCC), asymptomatic, schedule follow-up with Dr. Graciela Husbands. -     Basic Metabolic Panel  Primary osteoarthritis of left ankle recommended ice on her bothersome ankle at the end of the day.  Prescribed Voltaren gel -     diclofenac sodium (VOLTAREN) 1 % GEL; Apply 4 g topically 4 (four) times daily.  Screening for breast cancer discussed benefits and risks of breast cancer screening at length.  We also discussed the false positive rate. -     MM Digital Screening; Future  Dyspepsia suspect her intermittent epigastric pain when she lays down is most likely related to her ongoing acid reflux which she has had for some time. -     pantoprazole (PROTONIX) 20 MG tablet; Take 1 tablet (20 mg total) by mouth daily.   At  follow up- CRC screening   Danielle Singh, MD  [facility] Medicine Teaching Service

## 2018-07-13 LAB — BASIC METABOLIC PANEL
BUN/Creatinine Ratio: 12 (ref 12–28)
BUN: 10 mg/dL (ref 8–27)
CO2: 21 mmol/L (ref 20–29)
Calcium: 9.2 mg/dL (ref 8.7–10.3)
Chloride: 106 mmol/L (ref 96–106)
Creatinine, Ser: 0.83 mg/dL (ref 0.57–1.00)
GFR calc Af Amer: 83 mL/min/{1.73_m2} (ref >59)
GFR calc non Af Amer: 72 mL/min/{1.73_m2} (ref >59)
Glucose: 110 mg/dL — ABNORMAL HIGH (ref 65–99)
Potassium: 3.7 mmol/L (ref 3.5–5.2)
Sodium: 142 mmol/L (ref 134–144)

## 2018-07-15 ENCOUNTER — Encounter: Payer: Self-pay | Admitting: Family Medicine

## 2018-07-15 NOTE — Progress Notes (Signed)
Sent letter, non fasting labs, normal. Dorris Singh, MD  [facility] Medicine Teaching Service

## 2018-08-06 ENCOUNTER — Encounter: Payer: Self-pay | Admitting: Family Medicine

## 2018-09-02 ENCOUNTER — Other Ambulatory Visit: Payer: Self-pay

## 2018-09-02 DIAGNOSIS — I428 Other cardiomyopathies: Secondary | ICD-10-CM

## 2018-09-02 MED ORDER — FUROSEMIDE 40 MG PO TABS: 40.0000 mg | ORAL_TABLET | Freq: Every day | ORAL | 3 refills | Status: DC

## 2018-09-16 ENCOUNTER — Telehealth: Payer: Self-pay | Admitting: Family Medicine

## 2018-09-16 ENCOUNTER — Encounter: Payer: Self-pay | Admitting: Family Medicine

## 2018-09-16 ENCOUNTER — Ambulatory Visit (HOSPITAL_COMMUNITY)
Admission: RE | Admit: 2018-09-16 | Discharge: 2018-09-16 | Disposition: A | Discharge status: Home / Self Care | Payer: Medicare Other | Source: Ambulatory Visit | Attending: Family Medicine | Admitting: Family Medicine

## 2018-09-16 ENCOUNTER — Other Ambulatory Visit: Payer: Self-pay

## 2018-09-16 ENCOUNTER — Ambulatory Visit (INDEPENDENT_AMBULATORY_CARE_PROVIDER_SITE_OTHER): Payer: Medicare Other | Admitting: Family Medicine

## 2018-09-16 VITALS — BP 130/75 | HR 73

## 2018-09-16 DIAGNOSIS — R053 Chronic cough: Secondary | ICD-10-CM

## 2018-09-16 DIAGNOSIS — Z23 Encounter for immunization: Secondary | ICD-10-CM | POA: Diagnosis not present

## 2018-09-16 DIAGNOSIS — R0602 Shortness of breath: Secondary | ICD-10-CM | POA: Diagnosis not present

## 2018-09-16 DIAGNOSIS — R05 Cough: Secondary | ICD-10-CM | POA: Diagnosis not present

## 2018-09-16 MED ORDER — ALBUTEROL SULFATE HFA 108 (90 BASE) MCG/ACT IN AERS: 2.0000 | INHALATION_SPRAY | Freq: Every evening | RESPIRATORY_TRACT | 3 refills | Status: DC | PRN

## 2018-09-16 MED ORDER — CARVEDILOL 6.25 MG PO TABS: 6.2500 mg | ORAL_TABLET | Freq: Two times a day (BID) | ORAL | 3 refills | Status: DC

## 2018-09-16 MED ORDER — FLUTICASONE PROPIONATE 50 MCG/ACT NA SUSP: 2.0000 | Freq: Every day | NASAL | 0 refills | Status: DC

## 2018-09-16 NOTE — Telephone Encounter (Signed)
Attempted to call patient about normal chest x-ray result.

## 2018-09-16 NOTE — Patient Instructions (Addendum)
It was wonderful to see you today.  Thank you for choosing [facility].   Please call [phone] with any questions about today's appointment.  Please be sure to schedule follow up at the front  desk before you leave today.   Dorris Singh, MD  Family Medicine   Go get your chest x-ray today  I will call you with results   We talked a lot about your left heel pain.  I think this is plantar fasciitis.  Please freeze 2-3 water bottles each night.  Have Nicole Kindred get these for you in the morning.  Roll your left foot over these.  You can also buy night splints for your feet on [place] or get them from [facility].  Let me know if you have issues with these.  If your feet continue to cause trouble please give me a call

## 2018-09-16 NOTE — Progress Notes (Signed)
Patient Name: [name] Date of Birth: [birth year] Date of Visit: 09/16/18 PCP: Martyn Malay, MD  Chief Complaint: Chronic nighttime cough  Subjective: [name] is a pleasant 68 y.o. with medical history significant for ischemic cardiomyopathy, heart failure with reduced ejection fraction, cerebrovascular disease with residual cognitive impairment and GERD presenting today with her husband for concerns for chronic cough.    The patient has had 4 months of cough at this time.  She endorses a minimally productive cough.  This is worse at night.  Whenever she lies down at night she has a cough.  She denies fevers, chills, dyspnea, chest pain.  Her husband endorses wheezing and thinks that this is possibly due to her bronchitis.  She has tried a number of interventions for this.  Flonase they believe makes it worse.  Zyrtec, albuterol and Tussionex improve her symptoms.  She denies snoring at night.  Her husband sleeps on recliner so he is uncertain if she snores.  The patient reports otherwise she is doing well.  Her son got a full-time job which makes her pleased.  She is excited to get an influenza vaccine today.  She has not yet scheduled her mammogram    ROS: Per HPI.   I have reviewed the patient's medical, surgical, family, and social history as appropriate.  Vitals:   09/16/18 0850  BP: 130/75  Pulse: 73  SpO2: 99%    HEENT: Sclera anicteric. Dentition is moderate. Appears well hydrated.  Mild nasal congestion Neck: Supple Cardiac: Regular rate and rhythm. Normal S1/S2. No murmurs, rubs, or gallops appreciated. Lungs: Clear bilaterally to ascultation.  No wheezes rales or rhonchi.  Her lungs are completely clear Abdomen: Normoactive bowel sounds. No tenderness to deep or light palpation. No rebound or guarding.  Extremities: Warm, well perfused without edema.  Skin: Warm, dry Psych: Pleasant and appropriate     Chronic cough Differential is broad.  She denies  any uncontrolled GERD symptoms and is already on a PPI for this.  She reports Flonase and Zyrtec have not alleviated his symptoms and further review of her history she reports a significant history for asthma and allergic conditions.  She reports as a child she was "allergic to everything" and was diagnosed with asthma and bronchitis "all the time".  We will start with a trial of a nocturnal albuterol inhaler before she goes to sleep to see if this alleviates her symptoms.  Chest x-ray to evaluate for any underlying lung pathology.  The patient is going to trial a few days off of Flonase and see if this improves her symptoms  At follow-up consider pulmonary function tests  Medications reviewed, she is on Entresto which is unlikely to cause the symptoms.  Orders Placed This Encounter  Procedures  . DG Chest 2 View    Standing Status:   Future    Number of Occurrences:   1    Standing Expiration Date:   11/16/2019    Order Specific Question:   Reason for Exam (SYMPTOM  OR DIAGNOSIS REQUIRED)    Answer:   chronic cough    Order Specific Question:   Preferred imaging location?    Answer:   [facility]    Order Specific Question:   Radiology Contrast Protocol - do NOT remove file path    Answer:   [email or web address]  . Flu Vaccine QUAD 36+ mos IM    Return to care in 6 months, will check next week to see  if her cough is better.   Terisa Starr, MD  Family Medicine Teaching Service

## 2018-09-16 NOTE — Assessment & Plan Note (Signed)
Differential is broad.  She denies any uncontrolled GERD symptoms and is already on a PPI for this.  She reports Flonase and Zyrtec have not alleviated his symptoms and further review of her history she reports a significant history for asthma and allergic conditions.  She reports as a child she was "allergic to everything" and was diagnosed with asthma and bronchitis "all the time".  We will start with a trial of a nocturnal albuterol inhaler before she goes to sleep to see if this alleviates her symptoms.  Chest x-ray to evaluate for any underlying lung pathology.  The patient is going to trial a few days off of Flonase and see if this improves her symptoms  At follow-up consider pulmonary function tests  Medications reviewed, she is on Entresto which is unlikely to cause the symptoms.

## 2018-09-30 ENCOUNTER — Telehealth: Payer: Self-pay | Admitting: Family Medicine

## 2018-09-30 NOTE — Telephone Encounter (Signed)
Attempted to call patient at number listed to check cough.  Dorris Singh, MD  Family Medicine Teaching Service

## 2018-10-07 ENCOUNTER — Ambulatory Visit (INDEPENDENT_AMBULATORY_CARE_PROVIDER_SITE_OTHER): Payer: Medicare Other | Admitting: *Deleted

## 2018-10-07 DIAGNOSIS — I428 Other cardiomyopathies: Secondary | ICD-10-CM

## 2018-10-08 LAB — CUP PACEART REMOTE DEVICE CHECK
Battery Remaining Longevity: 38 mo
Battery Voltage: 2.96 V
Brady Statistic AP VP Percent: 0 %
Brady Statistic AP VS Percent: 0 %
Brady Statistic AS VP Percent: 0.01 %
Brady Statistic AS VS Percent: 99.99 %
Brady Statistic RA Percent Paced: 0 %
Brady Statistic RV Percent Paced: 0.01 %
Date Time Interrogation Session: 20200921072604
HighPow Impedance: 81 Ohm
Implantable Lead Implant Date: 20151021
Implantable Lead Implant Date: 20151021
Implantable Lead Implant Date: 20151021
Implantable Lead Location: 753858
Implantable Lead Location: 753859
Implantable Lead Location: 753860
Implantable Lead Model: 4298
Implantable Lead Model: 5076
Implantable Pulse Generator Implant Date: 20151021
Lead Channel Impedance Value: 1007 Ohm
Lead Channel Impedance Value: 418 Ohm
Lead Channel Impedance Value: 418 Ohm
Lead Channel Impedance Value: 475 Ohm
Lead Channel Impedance Value: 513 Ohm
Lead Channel Impedance Value: 551 Ohm
Lead Channel Impedance Value: 608 Ohm
Lead Channel Impedance Value: 646 Ohm
Lead Channel Impedance Value: 760 Ohm
Lead Channel Impedance Value: 817 Ohm
Lead Channel Impedance Value: 893 Ohm
Lead Channel Impedance Value: 931 Ohm
Lead Channel Impedance Value: 950 Ohm
Lead Channel Pacing Threshold Amplitude: 0.375 V
Lead Channel Pacing Threshold Amplitude: 0.75 V
Lead Channel Pacing Threshold Amplitude: 1.125 V
Lead Channel Pacing Threshold Pulse Width: 0.4 ms
Lead Channel Pacing Threshold Pulse Width: 0.4 ms
Lead Channel Pacing Threshold Pulse Width: 0.5 ms
Lead Channel Sensing Intrinsic Amplitude: 14 mV
Lead Channel Sensing Intrinsic Amplitude: 14 mV
Lead Channel Sensing Intrinsic Amplitude: 6.125 mV
Lead Channel Sensing Intrinsic Amplitude: 6.125 mV
Lead Channel Setting Pacing Amplitude: 2 V
Lead Channel Setting Pacing Pulse Width: 0.4 ms
Lead Channel Setting Sensing Sensitivity: 0.3 mV

## 2018-10-11 ENCOUNTER — Telehealth: Payer: Self-pay | Admitting: Internal Medicine

## 2018-10-11 NOTE — Telephone Encounter (Signed)
Spoke w/ pt and informed her that the cooper infused face mask will not interfere w/ her device. Pt verbalized understanding.

## 2018-10-11 NOTE — Telephone Encounter (Signed)
New Message  Patient states that she just purchased a copper infused face protector and patient wants to know if she can wear it without it affected her pacemaker. Please give patient a call back to confirm.

## 2018-10-15 ENCOUNTER — Encounter: Payer: Self-pay | Admitting: Cardiology

## 2018-10-15 NOTE — Progress Notes (Signed)
Remote ICD transmission.   

## 2018-10-17 NOTE — Telephone Encounter (Signed)
Attempted to call patient to check in on cough.   Dorris Singh, MD  Family Medicine Teaching Service

## 2018-10-25 ENCOUNTER — Ambulatory Visit: Payer: Medicare Other

## 2018-11-07 ENCOUNTER — Telehealth (HOSPITAL_COMMUNITY): Payer: Self-pay | Admitting: Vascular Surgery

## 2018-11-07 NOTE — Telephone Encounter (Signed)
Returned call left on scheduling line, pt did not say what she wanted in VM

## 2018-11-12 ENCOUNTER — Encounter (HOSPITAL_COMMUNITY): Payer: Medicare Other | Admitting: Cardiology

## 2019-01-06 ENCOUNTER — Ambulatory Visit (INDEPENDENT_AMBULATORY_CARE_PROVIDER_SITE_OTHER): Payer: Medicare HMO | Admitting: *Deleted

## 2019-01-06 DIAGNOSIS — I428 Other cardiomyopathies: Secondary | ICD-10-CM | POA: Diagnosis not present

## 2019-01-06 LAB — CUP PACEART REMOTE DEVICE CHECK
Battery Remaining Longevity: 32 mo
Battery Voltage: 2.96 V
Brady Statistic AP VP Percent: 0 %
Brady Statistic AP VS Percent: 0 %
Brady Statistic AS VP Percent: 0 %
Brady Statistic AS VS Percent: 100 %
Brady Statistic RA Percent Paced: 0 %
Brady Statistic RV Percent Paced: 0.01 %
Date Time Interrogation Session: 20201221052821
HighPow Impedance: 80 Ohm
Implantable Lead Implant Date: 20151021
Implantable Lead Implant Date: 20151021
Implantable Lead Implant Date: 20151021
Implantable Lead Location: 753858
Implantable Lead Location: 753859
Implantable Lead Location: 753860
Implantable Lead Model: 4298
Implantable Lead Model: 5076
Implantable Pulse Generator Implant Date: 20151021
Lead Channel Impedance Value: 1007 Ohm
Lead Channel Impedance Value: 418 Ohm
Lead Channel Impedance Value: 456 Ohm
Lead Channel Impedance Value: 475 Ohm
Lead Channel Impedance Value: 475 Ohm
Lead Channel Impedance Value: 551 Ohm
Lead Channel Impedance Value: 589 Ohm
Lead Channel Impedance Value: 665 Ohm
Lead Channel Impedance Value: 722 Ohm
Lead Channel Impedance Value: 779 Ohm
Lead Channel Impedance Value: 874 Ohm
Lead Channel Impedance Value: 893 Ohm
Lead Channel Impedance Value: 950 Ohm
Lead Channel Pacing Threshold Amplitude: 0.375 V
Lead Channel Pacing Threshold Amplitude: 0.75 V
Lead Channel Pacing Threshold Amplitude: 1.125 V
Lead Channel Pacing Threshold Pulse Width: 0.4 ms
Lead Channel Pacing Threshold Pulse Width: 0.4 ms
Lead Channel Pacing Threshold Pulse Width: 0.5 ms
Lead Channel Sensing Intrinsic Amplitude: 11 mV
Lead Channel Sensing Intrinsic Amplitude: 11 mV
Lead Channel Sensing Intrinsic Amplitude: 5.125 mV
Lead Channel Sensing Intrinsic Amplitude: 5.125 mV
Lead Channel Setting Pacing Amplitude: 2 V
Lead Channel Setting Pacing Pulse Width: 0.4 ms
Lead Channel Setting Sensing Sensitivity: 0.3 mV

## 2019-01-20 ENCOUNTER — Encounter (HOSPITAL_COMMUNITY): Payer: Medicare Other | Admitting: Cardiology

## 2019-02-18 ENCOUNTER — Encounter: Payer: Self-pay | Admitting: Internal Medicine

## 2019-02-18 ENCOUNTER — Ambulatory Visit (HOSPITAL_COMMUNITY)
Admission: RE | Admit: 2019-02-18 | Discharge: 2019-02-18 | Disposition: A | Discharge status: Home / Self Care | Payer: Medicare HMO | Source: Ambulatory Visit | Attending: Cardiology | Admitting: Cardiology

## 2019-02-18 ENCOUNTER — Other Ambulatory Visit: Payer: Self-pay

## 2019-02-18 ENCOUNTER — Encounter (HOSPITAL_COMMUNITY): Payer: Self-pay | Admitting: Cardiology

## 2019-02-18 ENCOUNTER — Ambulatory Visit (INDEPENDENT_AMBULATORY_CARE_PROVIDER_SITE_OTHER): Payer: Medicare HMO | Admitting: Internal Medicine

## 2019-02-18 VITALS — BP 97/73 | HR 70 | Wt 196.0 lb

## 2019-02-18 VITALS — BP 84/62 | HR 74 | Ht 64.5 in | Wt 196.0 lb

## 2019-02-18 DIAGNOSIS — Z7902 Long term (current) use of antithrombotics/antiplatelets: Secondary | ICD-10-CM | POA: Insufficient documentation

## 2019-02-18 DIAGNOSIS — I447 Left bundle-branch block, unspecified: Secondary | ICD-10-CM | POA: Insufficient documentation

## 2019-02-18 DIAGNOSIS — E785 Hyperlipidemia, unspecified: Secondary | ICD-10-CM | POA: Diagnosis not present

## 2019-02-18 DIAGNOSIS — Z8249 Family history of ischemic heart disease and other diseases of the circulatory system: Secondary | ICD-10-CM | POA: Insufficient documentation

## 2019-02-18 DIAGNOSIS — I340 Nonrheumatic mitral (valve) insufficiency: Secondary | ICD-10-CM | POA: Diagnosis not present

## 2019-02-18 DIAGNOSIS — Z791 Long term (current) use of non-steroidal anti-inflammatories (NSAID): Secondary | ICD-10-CM | POA: Insufficient documentation

## 2019-02-18 DIAGNOSIS — Z79899 Other long term (current) drug therapy: Secondary | ICD-10-CM | POA: Diagnosis not present

## 2019-02-18 DIAGNOSIS — I5022 Chronic systolic (congestive) heart failure: Secondary | ICD-10-CM | POA: Diagnosis not present

## 2019-02-18 DIAGNOSIS — Z95810 Presence of automatic (implantable) cardiac defibrillator: Secondary | ICD-10-CM | POA: Diagnosis not present

## 2019-02-18 DIAGNOSIS — I428 Other cardiomyopathies: Secondary | ICD-10-CM

## 2019-02-18 DIAGNOSIS — Z8673 Personal history of transient ischemic attack (TIA), and cerebral infarction without residual deficits: Secondary | ICD-10-CM | POA: Insufficient documentation

## 2019-02-18 LAB — BASIC METABOLIC PANEL
Anion gap: 7 (ref 5–15)
BUN: 11 mg/dL (ref 8–23)
CO2: 26 mmol/L (ref 22–32)
Calcium: 8.9 mg/dL (ref 8.9–10.3)
Chloride: 108 mmol/L (ref 98–111)
Creatinine, Ser: 0.96 mg/dL (ref 0.44–1.00)
GFR calc Af Amer: >60 mL/min (ref >60)
GFR calc non Af Amer: 60 mL/min — ABNORMAL LOW (ref >60)
Glucose, Bld: 117 mg/dL — ABNORMAL HIGH (ref 70–99)
Potassium: 3.8 mmol/L (ref 3.5–5.1)
Sodium: 141 mmol/L (ref 135–145)

## 2019-02-18 LAB — LIPID PANEL
Cholesterol: 161 mg/dL (ref 0–200)
HDL: 36 mg/dL — ABNORMAL LOW (ref >40)
LDL Cholesterol: 105 mg/dL — ABNORMAL HIGH (ref 0–99)
Total CHOL/HDL Ratio: 4.5 RATIO
Triglycerides: 101 mg/dL (ref <150)
VLDL: 20 mg/dL (ref 0–40)

## 2019-02-18 NOTE — Progress Notes (Signed)
Patient Care Team: Westley Chandler, MD as PCP - General (Family Medicine) [name], MD as Consulting Physician (Cardiology)   HPI  [name] is a 69 y.o. female Seen With Chief Complaint of   Medtronic CRT-D 2015 for NICM intermittent LBBB and CHF  RV only pacing   She is S/p CVA without interval detection of atrial fibrillation.     No chest pain, chronic shortness of breath, LH or edema     DATE TEST EF   7/15 LHC  No Angiographic CAD  7 /15 Echo  20-25 %   12/17 Echo 45-50 %   3/19 Echo  45-50%          Date Cr K  4/18 0.8 3.4  10/19  0.89 4.3  6/20 0.83 3.7        Past Medical History:  Diagnosis Date  . Arthritis   . Automatic implantable cardioverter-defibrillator in situ   . Cardiac resynchronization therapy defibrillator (CRT-D) in place    a. placed on 11/05/13.  [name] GERD (gastroesophageal reflux disease)   . HLD (hyperlipidemia)   . Hypotension   . LBBB (left bundle branch block)   . Mitral regurgitation    a. Mild-mod MR by echo 07/2013.  [name] PONV (postoperative nausea and vomiting)   . Stroke (HCC) 09/23/2013  . Systolic CHF (HCC)    a. Dx 07/2013 - due to NICM. Cath normal cors. EF 25%.    Past Surgical History:  Procedure Laterality Date  . BI-VENTRICULAR IMPLANTABLE CARDIOVERTER DEFIBRILLATOR N/A 11/05/2013   Procedure: BI-VENTRICULAR IMPLANTABLE CARDIOVERTER DEFIBRILLATOR  (CRT-D);  Surgeon: Duke Salvia, MD;  Location: [facility] CATH LAB;  Service: Cardiovascular;  Laterality: N/A;  . BI-VENTRICULAR IMPLANTABLE CARDIOVERTER DEFIBRILLATOR  (CRT-D)  11/05/2013  . CARDIAC CATHETERIZATION  07/2013  . KNEE ARTHROSCOPY Right   . LEFT AND RIGHT HEART CATHETERIZATION WITH CORONARY ANGIOGRAM N/A 08/08/2013   Procedure: LEFT AND RIGHT HEART CATHETERIZATION WITH CORONARY ANGIOGRAM;  Surgeon: [name], MD;  Location: [facility] CATH LAB;  Service: Cardiovascular;  Laterality: N/A;  . TEE WITHOUT CARDIOVERSION N/A 09/26/2013   Procedure:  TRANSESOPHAGEAL ECHOCARDIOGRAM (TEE);  Surgeon: [name], MD;  Location: [facility] ENDOSCOPY;  Service: Cardiovascular;  Laterality: N/A;  . TONSILLECTOMY    . WRIST ARTHROSCOPY  12/21/2010   Procedure: ARTHROSCOPY WRIST;  Surgeon: [name], MD;  Location:  SURGERY CENTER;  Service: Orthopedics;  Laterality: Right;  right wrist, repair TFCC on right, open reconstruction ECU sheath    Current Outpatient Medications  Medication Sig Dispense Refill  . albuterol (VENTOLIN HFA) 108 (90 Base) MCG/ACT inhaler Inhale 2 puffs into the lungs at bedtime as needed for wheezing or shortness of breath. 18 g 3  . ALPRAZolam (XANAX) 0.5 MG tablet     . ARNUITY ELLIPTA 50 MCG/ACT AEPB     . atorvastatin (LIPITOR) 40 MG tablet Take 1 tablet (40 mg total) by mouth daily at 6 PM. 90 tablet 1  . azelastine (OPTIVAR) 0.05 % ophthalmic solution Place 1 drop into the left eye 2 (two) times daily. 6 mL 12  . carvedilol (COREG) 6.25 MG tablet Take 1 tablet (6.25 mg total) by mouth 2 (two) times daily with a meal. 180 tablet 3  . clopidogrel (PLAVIX) 75 MG tablet Take 1 tablet (75 mg total) by mouth daily. 90 tablet 3  . diclofenac sodium (VOLTAREN) 1 % GEL Apply 4 g topically 4 (four) times daily. 1550 g  11  . ENTRESTO 24-26 MG TAKE ONE TABLET BY MOUTH TWICE DAILY 180 tablet 3  . fluticasone (FLONASE) 50 MCG/ACT nasal spray Place 2 sprays into both nostrils daily. 16 g 0  . furosemide (LASIX) 40 MG tablet Take 1 tablet (40 mg total) by mouth daily. 90 tablet 3  . pantoprazole (PROTONIX) 40 MG tablet     . potassium chloride SA (K-DUR,KLOR-CON) 20 MEQ tablet Take 1 tablet (20 mEq total) by mouth daily. 90 tablet 3  . traMADol (ULTRAM) 50 MG tablet      No current facility-administered medications for this visit.    Allergies  Allergen Reactions  . Onion Other (See Comments)    gas  . Tomato Other (See Comments)    reflux    Review of Systems negative except from HPI and PMH  Physical Exam BP  (!) 84/62   Pulse 74   Ht 5' 4.5" (1.638 m)   Wt 196 lb (88.9 kg)   SpO2 96%   BMI 33.12 kg/m  Well developed and well nourished in no acute distress HENT normal Neck supple with JVP-flat Clear Device pocket well healed; without hematoma or erythema.  There is no tethering  Regular rate and rhythm, no  murmur Abd-soft with active BS No Clubbing cyanosis  edema Skin-warm and dry A & Oriented  Grossly normal sensory and motor function  ECG  From 2/21 >> sinus LBBB @ 76 13/14/44 1/20 sinus @ 68 QRSd 108 msec  Assessment and  Plan   Nonischemic cardio myopathy  ICD-CRT Medtronic Programmed VVI @ 40 2/2 intermittent LBBB  Congestive heart failure-chronic systolic  Left bundle branch block intermittent  Prior stroke with residual weakness    Euvolemic continue current meds  No intercurrent atrial fibrillation or flutter  LBBB has been intermittent.  If becomes more chronic, we can reprogram her LV lead on to attempt resynchronization  BP low, but surprisingly few symptoms

## 2019-02-18 NOTE — Patient Instructions (Signed)
Labs today We will only contact you if something comes back abnormal or we need to make some changes. Otherwise no news is good news!   Your physician has requested that you have an echocardiogram. Echocardiography is a painless test that uses sound waves to create images of your heart. It provides your doctor with information about the size and shape of your heart and how well your heart's chambers and valves are working. This procedure takes approximately one hour. There are no restrictions for this procedure.   Your physician recommends that you schedule a follow-up appointment in: 6 months with Dr Shirlee Latch. Please call us to schedule an appointment.   At the [facility], you and your health needs are our priority. As part of our continuing mission to provide you with exceptional heart care, we have created designated Provider Care Teams. These Care Teams include your primary Cardiologist (physician) and Advanced Practice Providers (APPs- Physician Assistants and Nurse Practitioners) who all work together to provide you with the care you need, when you need it.    You may see any of the following providers on your designated Care Team at your next follow up: [name] Dr Arvilla Meres . Dr [name] . Tonye Becket, NP . Robbie Lis, PA . Karle Plumber, PharmD   Please be sure to bring in all your medications bottles to every appointment.

## 2019-02-18 NOTE — Patient Instructions (Signed)

## 2019-02-19 NOTE — Progress Notes (Signed)
Patient ID: [name], female   DOB: December 22, 1950, 69 y.o.   MRN: [id number] PCP: Dr Juanito Doom EP: Dr Caryl Comes.  Cardiologist: Dr Aundra Dubin  Neurologist: Dr Leonie Man  HPI: [name] is a 69 y.o. female with chronic systolic CHF with nonischemic cardiomypathy (no significant CAD) s/p Medtronic CRT-D, LBBB, mild-mod MR and CVA (09/2013).  Coronary angiography in 7/15 showed no significant CAD.  Admitted in 9/15 with right sided facial droop and expressive aphasia, CVA on MRI. Had TEE showing EF 15%.    QRS narrowed, so in 11/16 device was reprogrammed to limit V-pacing.   Echo in 12/17 showed EF up to 45-50%.  Echo in 3/19 showed EF 45-50% with moderate LVH and normal RV.   She returns for followup of CHF.  I have not seen her in over a year.  Her weight is up 16 lbs.  She thinks this is caloric rather than fluid; she is now on a low carbohydrate diet. No chest pain.  No lightheadedness.  No dyspnea walking on flat ground.  She does have bendopnea at times.  No orthopnea/PND.    ECG (personally reviewed): NSR, LBBB.  QRS wider than in the past and I do not see definite pacer spikes so not sure she is BiV pacing.   PMH: 1. Nonischemic cardiomyopathy: Echo (7/15) with severe LV dilation, EF 25% with diffuse hypokinesis, mild to moderate MR.  RHC/ LHC 08/08/13 with RA 4, PA 23/8, PCWP 11, CI 2.52; no CAD.  TEE (9/15) with EF 15-20%, severe global hypokinesis, no LV thrombus, moderate MR, normal RV.  Echo (10/2013): EF 25-30%, grade I DD, mild AI. Medtronic CRT-D (10/15).  Echo (2/16) with EF 25-30%, diffuse hypokinesis worse in the septum, grade I diastolic dysfunction, normal RV size and systolic function, mild-moderate MR.  - Echo (8/16) with EF 35-40%, diffuse hypokinesis, mild-moderate mitral regurgitation.  - Echo (12/17): EF 45-50%, mild LVH, mild to moderate MR.  - Echo (3/19): EF 45-50% with moderate LVH, mild MR, normal RV size and systolic function.  2. CVA: 9/15 with dysarthria.  Carotids 9/15  with no significant CAD.  - Carotid dopplers (10/17): mild plaque, normal subclavians.  3. LBBB: Medtronic CRT-D (10/2013) 4. Hyperlipidemia 5. Cholelithiasis 6. Peripheral arterial dopplers (6/19): Normal  Labs 09/23/13 K 5.1 Creatinine 0.68 Cholesterol 166 TGl 83 HDL 41, LDL 108 10/07/13: K 3.9, creatinine 0.65, LDL 108 11/03/13: K 3.8, creatinine 0.8 11/20/13: K 3.9 Creatinine 0.66  1/16: K 3.6, creatinine 0.77 04/08/2014: K 4.2 Creatinine 0.73 5/16: K 4, creatinine 0.72 8/16: LDL 79, HDL 44 9/16: K 3.8, creatinine 0.76 1/17: Hgb 12.5 11/17: K 3.7, creatinine 0.71, LDL 90 1/18: LDL 77, HDl 40, AST normal, ALT 56, alkaline phosphatase 204 12/18: K 3.9, creatinine 0.79 3/19: K 3.7, creatinine 0.73, LDL 71, HDL 40 6/20: K 3.7, creatinine 0.83  SH: lives with her son and husband. Does not drink or smoke. Currently not working.   FH: Mom had A fib and Heart Failure. Deceased 2        Sister has lupus  ROS: All systems negative except as listed in HPI, PMH and Problem List.   Current Outpatient Medications  Medication Sig Dispense Refill  . albuterol (VENTOLIN HFA) 108 (90 Base) MCG/ACT inhaler Inhale 2 puffs into the lungs at bedtime as needed for wheezing or shortness of breath. 18 g 3  . ALPRAZolam (XANAX) 0.5 MG tablet     . ARNUITY ELLIPTA 50 MCG/ACT AEPB     .  atorvastatin (LIPITOR) 40 MG tablet Take 1 tablet (40 mg total) by mouth daily at 6 PM. 90 tablet 1  . azelastine (OPTIVAR) 0.05 % ophthalmic solution Place 1 drop into the left eye 2 (two) times daily. 6 mL 12  . carvedilol (COREG) 6.25 MG tablet Take 1 tablet (6.25 mg total) by mouth 2 (two) times daily with a meal. 180 tablet 3  . clopidogrel (PLAVIX) 75 MG tablet Take 1 tablet (75 mg total) by mouth daily. 90 tablet 3  . diclofenac sodium (VOLTAREN) 1 % GEL Apply 4 g topically 4 (four) times daily. 1550 g 11  . ENTRESTO 24-26 MG TAKE ONE TABLET BY MOUTH TWICE DAILY 180 tablet 3  . fluticasone (FLONASE) 50 MCG/ACT  nasal spray Place 2 sprays into both nostrils daily. 16 g 0  . furosemide (LASIX) 40 MG tablet Take 1 tablet (40 mg total) by mouth daily. 90 tablet 3  . pantoprazole (PROTONIX) 40 MG tablet     . potassium chloride SA (K-DUR,KLOR-CON) 20 MEQ tablet Take 1 tablet (20 mEq total) by mouth daily. 90 tablet 3  . traMADol (ULTRAM) 50 MG tablet      No current facility-administered medications for this encounter.    Vitals:   02/18/19 1124  BP: 97/73  Pulse: 70  SpO2: 98%  Weight: 88.9 kg (196 lb)    PHYSICAL EXAM: General: NAD Neck: No JVD, no thyromegaly or thyroid nodule.  Lungs: Clear to auscultation bilaterally with normal respiratory effort. CV: Nondisplaced PMI.  Heart regular S1/S2, no S3/S4, no murmur.  No peripheral edema.  No carotid bruit.  Normal pedal pulses.  Abdomen: Soft, nontender, no hepatosplenomegaly, no distention.  Skin: Intact without lesions or rashes.  Neurologic: Alert and oriented x 3.  Psych: Normal affect. Extremities: No clubbing or cyanosis.  HEENT: Normal.   ASSESSMENT & PLAN:  1. Chronic Systolic Heart Failure: Nonischemic cardiomyopathy s/p CRT-D in 10/15, coronary angiography without CAD in 07/2013. Echo done in 2/16, EF 25-30%.  Repeat echo in 8/16 with EF up to 35-40%.  Last echo in 4/19 showed EF up to 45-50%.  NYHA class II. She is not volume overloaded by exam.  I am not sure that she is BiV pacing appropriately, QRS is wider on today's ECG and unable to detect pacer spikes.  - She is seeing Dr. Graciela Husbands today, will have device interrogation.  - Continue Lasix 40 mg daily. BMET today.  - Continue Coreg 6.25 mg bid, she did not tolerate increase to 9.375 mg bid.   - Continue Entresto 24/26 bid, wil not increase with soft BP.    - She did not tolerate spironolactone in the past.  - I will arrange for repeat echo.  2. CVA: ?Embolic.  No atrial fibrillation has been detected so far on device.  She will continue Plavix for now. If future device  interrogation shows atrial fibrillation, she will need to be anticoagulated.   3. Hyperlipidemia: Check lipids.   Followup in 6 months.   [name] 02/19/2019

## 2019-03-01 ENCOUNTER — Ambulatory Visit: Payer: Medicare HMO

## 2019-03-20 ENCOUNTER — Encounter: Payer: Medicare HMO | Admitting: Internal Medicine

## 2019-04-07 ENCOUNTER — Ambulatory Visit (INDEPENDENT_AMBULATORY_CARE_PROVIDER_SITE_OTHER): Payer: Medicare HMO | Admitting: *Deleted

## 2019-04-07 DIAGNOSIS — I428 Other cardiomyopathies: Secondary | ICD-10-CM

## 2019-04-08 LAB — CUP PACEART REMOTE DEVICE CHECK
Battery Remaining Longevity: 31 mo
Battery Voltage: 2.95 V
Brady Statistic AP VP Percent: 0 %
Brady Statistic AP VS Percent: 0 %
Brady Statistic AS VP Percent: 0 %
Brady Statistic AS VS Percent: 100 %
Brady Statistic RA Percent Paced: 0 %
Brady Statistic RV Percent Paced: 0.02 %
Date Time Interrogation Session: 20210322090931
HighPow Impedance: 84 Ohm
Implantable Lead Implant Date: 20151021
Implantable Lead Implant Date: 20151021
Implantable Lead Implant Date: 20151021
Implantable Lead Location: 753858
Implantable Lead Location: 753859
Implantable Lead Location: 753860
Implantable Lead Model: 4298
Implantable Lead Model: 5076
Implantable Pulse Generator Implant Date: 20151021
Lead Channel Impedance Value: 418 Ohm
Lead Channel Impedance Value: 456 Ohm
Lead Channel Impedance Value: 456 Ohm
Lead Channel Impedance Value: 513 Ohm
Lead Channel Impedance Value: 551 Ohm
Lead Channel Impedance Value: 551 Ohm
Lead Channel Impedance Value: 665 Ohm
Lead Channel Impedance Value: 722 Ohm
Lead Channel Impedance Value: 760 Ohm
Lead Channel Impedance Value: 836 Ohm
Lead Channel Impedance Value: 931 Ohm
Lead Channel Impedance Value: 931 Ohm
Lead Channel Impedance Value: 988 Ohm
Lead Channel Pacing Threshold Amplitude: 0.375 V
Lead Channel Pacing Threshold Amplitude: 0.75 V
Lead Channel Pacing Threshold Amplitude: 1.125 V
Lead Channel Pacing Threshold Pulse Width: 0.4 ms
Lead Channel Pacing Threshold Pulse Width: 0.4 ms
Lead Channel Pacing Threshold Pulse Width: 0.5 ms
Lead Channel Sensing Intrinsic Amplitude: 13.75 mV
Lead Channel Sensing Intrinsic Amplitude: 13.75 mV
Lead Channel Sensing Intrinsic Amplitude: 5.625 mV
Lead Channel Sensing Intrinsic Amplitude: 5.625 mV
Lead Channel Setting Pacing Amplitude: 2 V
Lead Channel Setting Pacing Pulse Width: 0.4 ms
Lead Channel Setting Sensing Sensitivity: 0.3 mV

## 2019-04-08 NOTE — Progress Notes (Signed)
ICD Remote  

## 2019-05-15 ENCOUNTER — Ambulatory Visit (HOSPITAL_COMMUNITY)
Admission: RE | Admit: 2019-05-15 | Discharge: 2019-05-15 | Disposition: A | Discharge status: Home / Self Care | Payer: Medicare HMO | Source: Ambulatory Visit | Attending: Cardiology | Admitting: Cardiology

## 2019-05-15 ENCOUNTER — Other Ambulatory Visit: Payer: Self-pay

## 2019-05-15 DIAGNOSIS — I509 Heart failure, unspecified: Secondary | ICD-10-CM | POA: Diagnosis not present

## 2019-05-15 DIAGNOSIS — I340 Nonrheumatic mitral (valve) insufficiency: Secondary | ICD-10-CM | POA: Diagnosis not present

## 2019-05-15 DIAGNOSIS — I428 Other cardiomyopathies: Secondary | ICD-10-CM

## 2019-05-15 NOTE — Progress Notes (Signed)
  Echocardiogram 2D Echocardiogram has been performed.  [name] 05/15/2019, 9:31 AM

## 2019-05-29 ENCOUNTER — Encounter (HOSPITAL_COMMUNITY): Payer: Self-pay

## 2019-05-29 ENCOUNTER — Encounter (HOSPITAL_COMMUNITY): Payer: Self-pay | Admitting: Cardiology

## 2019-05-29 ENCOUNTER — Other Ambulatory Visit: Payer: Self-pay

## 2019-05-29 ENCOUNTER — Ambulatory Visit (HOSPITAL_COMMUNITY)
Admission: RE | Admit: 2019-05-29 | Discharge: 2019-05-29 | Disposition: A | Discharge status: Home / Self Care | Payer: Medicare HMO | Source: Ambulatory Visit | Attending: Cardiology | Admitting: Cardiology

## 2019-05-29 VITALS — BP 122/86 | HR 64 | Wt 194.0 lb

## 2019-05-29 DIAGNOSIS — Z79899 Other long term (current) drug therapy: Secondary | ICD-10-CM | POA: Diagnosis not present

## 2019-05-29 DIAGNOSIS — I5022 Chronic systolic (congestive) heart failure: Secondary | ICD-10-CM

## 2019-05-29 DIAGNOSIS — I340 Nonrheumatic mitral (valve) insufficiency: Secondary | ICD-10-CM | POA: Insufficient documentation

## 2019-05-29 DIAGNOSIS — Z7902 Long term (current) use of antithrombotics/antiplatelets: Secondary | ICD-10-CM | POA: Insufficient documentation

## 2019-05-29 DIAGNOSIS — E785 Hyperlipidemia, unspecified: Secondary | ICD-10-CM | POA: Insufficient documentation

## 2019-05-29 DIAGNOSIS — I447 Left bundle-branch block, unspecified: Secondary | ICD-10-CM | POA: Insufficient documentation

## 2019-05-29 DIAGNOSIS — Z8249 Family history of ischemic heart disease and other diseases of the circulatory system: Secondary | ICD-10-CM | POA: Insufficient documentation

## 2019-05-29 DIAGNOSIS — Z95810 Presence of automatic (implantable) cardiac defibrillator: Secondary | ICD-10-CM | POA: Diagnosis not present

## 2019-05-29 DIAGNOSIS — I428 Other cardiomyopathies: Secondary | ICD-10-CM | POA: Insufficient documentation

## 2019-05-29 DIAGNOSIS — Z8673 Personal history of transient ischemic attack (TIA), and cerebral infarction without residual deficits: Secondary | ICD-10-CM | POA: Diagnosis not present

## 2019-05-29 LAB — BASIC METABOLIC PANEL
Anion gap: 8 (ref 5–15)
BUN: 11 mg/dL (ref 8–23)
CO2: 25 mmol/L (ref 22–32)
Calcium: 8.9 mg/dL (ref 8.9–10.3)
Chloride: 107 mmol/L (ref 98–111)
Creatinine, Ser: 0.88 mg/dL (ref 0.44–1.00)
GFR calc Af Amer: >60 mL/min (ref >60)
GFR calc non Af Amer: >60 mL/min (ref >60)
Glucose, Bld: 111 mg/dL — ABNORMAL HIGH (ref 70–99)
Potassium: 3.5 mmol/L (ref 3.5–5.1)
Sodium: 140 mmol/L (ref 135–145)

## 2019-05-29 MED ORDER — SPIRONOLACTONE 25 MG PO TABS: 12.5000 mg | ORAL_TABLET | Freq: Every day | ORAL | 3 refills | Status: DC

## 2019-05-29 NOTE — Patient Instructions (Addendum)
START Spironolactone 12.5mg  (1/2 tab) every night  Labs today and repeat in 10 days We will only contact you if something comes back abnormal or we need to make some changes. Otherwise no news is good news!  Your physician recommends that you schedule a follow-up appointment in: 3 weeks with the Nurse Practitioner   [name] [address] [id number] [facility] [place] [address] Dept: [phone] Loc: [phone]  [name]  05/29/2019  You are scheduled for a Cardiac Catheterization on Thursday, May 20 with Dr. Marca Castro.  1. Please arrive at the [facility] (Main Entrance A) at [facility]: [address] [place], [place] [address] at 10:30 AM (This time is two hours before your procedure to ensure your preparation). Free valet parking service is available.    Special note: Every effort is made to have your procedure done on time. Please understand that emergencies sometimes delay scheduled procedures.  2. Diet: Do not eat or drink after midnight  3. Labs: done today in office  You will need a pre procedure COVID test    WHEN:  Tuesday May 18th, 2021 anytime between 9am-3pm WHERE: [facility]       [address] [place] [address]  This is a drive thru testing site, you will remain in your car. Be sure to get in the line FOR PROCEDURES Once you have been swabbed you will need to remain home in quarantine until you return for your procedure.   4. Medication instructions in preparation for your procedure:   Contrast Allergy: No   HOLD Furosemide and Spironolactone on the morning of your procedure  On the morning of your procedure, take your Plavix/Clopidogrel and any morning medicines NOT listed above.  You may use sips of water.  5. Plan for one night stay--bring personal belongings. 6. Bring a current list of your medications and current insurance cards. 7.  You MUST have a responsible person to drive you home. 8. Someone MUST be with you the first 24 hours after you arrive home or your discharge will be delayed. 9. Please wear clothes that are easy to get on and off and wear slip-on shoes.  Thank you for allowing [place] to care for you!   -- [name] [facility]

## 2019-05-29 NOTE — Progress Notes (Signed)
Patient ID: AFSA MEANY, female   DOB: [birth year], 69 y.o.   MRN: [id number] PCP: Dr Juanito Doom EP: Dr Caryl Comes.  Cardiologist: Dr Aundra Dubin  Neurologist: Dr Leonie Man  HPI: Ms. Bergeron is a 69 y.o. female with chronic systolic CHF with nonischemic cardiomypathy (no significant CAD) s/p Medtronic CRT-D, LBBB, mild-mod MR and CVA (09/2013).  Coronary angiography in 7/15 showed no significant CAD.  Admitted in 9/15 with right sided facial droop and expressive aphasia, CVA on MRI. Had TEE showing EF 15%.    QRS narrowed, so in 11/16 device was reprogrammed to VVI.   Echo in 12/17 showed EF up to 45-50%.  Echo in 3/19 showed EF 45-50% with moderate LVH and normal RV.   Echo was done in 4/21 showing EF decreased to 30-35% with LAD territory wall motion abnormalities.   She returns for followup of CHF.  She is short of breath walking up a hill or incline. She generally does ok if she walks slow on flat ground.  No chest pain.  She has bendopnea, no orthopnea.  No palpitations.      ECG (personally reviewed): NSR, LBBB 146 msec  Medtronic device interrogation: No atrial fibrillation, stable thoracic impedance.   Labs (2/21): K 3.8, creatinine 0.96, LDL 105   PMH: 1. Nonischemic cardiomyopathy: Echo (7/15) with severe LV dilation, EF 25% with diffuse hypokinesis, mild to moderate MR.  RHC/ LHC 08/08/13 with RA 4, PA 23/8, PCWP 11, CI 2.52; no CAD.  TEE (9/15) with EF 15-20%, severe global hypokinesis, no LV thrombus, moderate MR, normal RV.  Echo (10/2013): EF 25-30%, grade I DD, mild AI. Medtronic CRT-D (10/15).  Echo (2/16) with EF 25-30%, diffuse hypokinesis worse in the septum, grade I diastolic dysfunction, normal RV size and systolic function, mild-moderate MR.  - Echo (8/16) with EF 35-40%, diffuse hypokinesis, mild-moderate mitral regurgitation.  - Echo (12/17): EF 45-50%, mild LVH, mild to moderate MR.  - Echo (3/19): EF 45-50% with moderate LVH, mild MR, normal RV size and systolic function.  -  Echo (4/21): EF 30-35% with LAD territory wall motion abnormalities. 2. CVA: 9/15 with dysarthria.  Carotids 9/15 with no significant CAD.  - Carotid dopplers (10/17): mild plaque, normal subclavians.  3. LBBB: Medtronic CRT-D (10/2013) 4. Hyperlipidemia 5. Cholelithiasis 6. Peripheral arterial dopplers (6/19): Normal  SH: lives with her son and husband. Does not drink or smoke. Currently not working.   FH: Mom had A fib and Heart Failure. Deceased 37        Sister has lupus  ROS: All systems negative except as listed in HPI, PMH and Problem List.   Current Outpatient Medications  Medication Sig Dispense Refill  . albuterol (VENTOLIN HFA) 108 (90 Base) MCG/ACT inhaler Inhale 2 puffs into the lungs at bedtime as needed for wheezing or shortness of breath. 18 g 3  . ALPRAZolam (XANAX) 0.5 MG tablet Take 0.5 mg by mouth at bedtime as needed for anxiety or sleep.     [name] atorvastatin (LIPITOR) 40 MG tablet Take 1 tablet (40 mg total) by mouth daily at 6 PM. (Patient taking differently: Take 80 mg by mouth daily. ) 90 tablet 1  . azelastine (OPTIVAR) 0.05 % ophthalmic solution Place 1 drop into the left eye 2 (two) times daily. (Patient taking differently: Place 1 drop into the left eye daily as needed. ) 6 mL 12  . carvedilol (COREG) 6.25 MG tablet Take 1 tablet (6.25 mg total) by mouth 2 (two) times daily with a  meal. 180 tablet 3  . clopidogrel (PLAVIX) 75 MG tablet Take 1 tablet (75 mg total) by mouth daily. 90 tablet 3  . ENTRESTO 24-26 MG TAKE ONE TABLET BY MOUTH TWICE DAILY (Patient taking differently: Take 1 tablet by mouth 2 (two) times daily. May crush. May be given with or without food. Observe for signs and symptoms of angioedema or hypotension. Entresto (sacubitril/valsartan) should NOT be administered within 36-hours of ACE-inhibitor administration.) 180 tablet 3  . fluticasone (FLONASE) 50 MCG/ACT nasal spray Place 2 sprays into both nostrils daily. (Patient taking differently:  Place 2 sprays into both nostrils daily as needed for allergies or rhinitis. ) 16 g 0  . furosemide (LASIX) 40 MG tablet Take 1 tablet (40 mg total) by mouth daily. 90 tablet 3  . pantoprazole (PROTONIX) 40 MG tablet Take 40 mg by mouth daily.     . potassium chloride SA (K-DUR,KLOR-CON) 20 MEQ tablet Take 1 tablet (20 mEq total) by mouth daily. 90 tablet 3  . traMADol (ULTRAM) 50 MG tablet Take 50 mg by mouth every 6 (six) hours as needed for severe pain (arthiritis in knees and ankle).     . cetirizine (ZYRTEC ALLERGY) 10 MG tablet Take 10 mg by mouth daily.    [name] spironolactone (ALDACTONE) 25 MG tablet Take 0.5 tablets (12.5 mg total) by mouth at bedtime. 45 tablet 3   No current facility-administered medications for this encounter.    Vitals:   05/29/19 0949  BP: 122/86  Pulse: 64  SpO2: 98%  Weight: 88 kg (194 lb)    PHYSICAL EXAM: General: NAD Neck: No JVD, no thyromegaly or thyroid nodule.  Lungs: Clear to auscultation bilaterally with normal respiratory effort. CV: Nondisplaced PMI.  Heart regular S1/S2, no S3/S4, no murmur.  No peripheral edema.  No carotid bruit.  Normal pedal pulses.  Abdomen: Soft, nontender, no hepatosplenomegaly, no distention.  Skin: Intact without lesions or rashes.  Neurologic: Alert and oriented x 3.  Psych: Normal affect. Extremities: No clubbing or cyanosis.  HEENT: Normal.   ASSESSMENT & PLAN:  1. Chronic Systolic Heart Failure: Nonischemic cardiomyopathy s/p Medtronic CRT-D in 10/15, coronary angiography without CAD in 07/2013. Echo done in 2/16, EF 25-30%.  Repeat echo in 8/16 with EF up to 35-40%.  Echo in 4/19 showed EF up to 45-50%.  However, echo in 4/21 showed EF back down to 30-35%.  Of note, Medtronic device had been reprogrammed to VVI as LBBB has been only intermittent. However, recent ECGs have consistently shown LBBB. NYHA class II. She is not volume overloaded by exam.  - With LBBB appearing consistent, I think she should be  reprogrammed to BiV pace again.  Will discuss with Dr. Graciela Husbands.  - Continue Lasix 40 mg daily. BMET today.  - Continue Coreg 6.25 mg bid, she did not tolerate increase to 9.375 mg bid.   - Continue Entresto 24/26 bid.  I will not increase today, BP looks ok today but generally runs low.    - I will try her on spironolactone 12.5 daily, she will take qhs. BMET 10 days.  - With fall in EF and LAD territory WMAs, I am going to arrange for LHC/RHC.  We discussed risks/benefits and she agrees to procedure.  If no significant CAD is found, wall motion abnormalities may be related to LBBB.  Would like to resume BiV pacing (see above).  2. CVA: ?Embolic.  No atrial fibrillation has been detected so far on device.  She will continue  Plavix for now. If future device interrogation shows atrial fibrillation, she will need to be anticoagulated.   3. Hyperlipidemia: Goal LDL < 70 with history of CVA. - Increase atorvastatin to 80 mg daily with lipids/LFTs in 2 months.   Followup in 3 months with NP/PA.    [name] 05/29/2019

## 2019-05-29 NOTE — H&P (View-Only) (Signed)
Patient ID: AFSA MEANY, female   DOB: [birth year], 69 y.o.   MRN: [id number] PCP: Dr Juanito Doom EP: Dr Caryl Comes.  Cardiologist: Dr Aundra Dubin  Neurologist: Dr Leonie Man  HPI: Ms. Bergeron is a 69 y.o. female with chronic systolic CHF with nonischemic cardiomypathy (no significant CAD) s/p Medtronic CRT-D, LBBB, mild-mod MR and CVA (09/2013).  Coronary angiography in 7/15 showed no significant CAD.  Admitted in 9/15 with right sided facial droop and expressive aphasia, CVA on MRI. Had TEE showing EF 15%.    QRS narrowed, so in 11/16 device was reprogrammed to VVI.   Echo in 12/17 showed EF up to 45-50%.  Echo in 3/19 showed EF 45-50% with moderate LVH and normal RV.   Echo was done in 4/21 showing EF decreased to 30-35% with LAD territory wall motion abnormalities.   She returns for followup of CHF.  She is short of breath walking up a hill or incline. She generally does ok if she walks slow on flat ground.  No chest pain.  She has bendopnea, no orthopnea.  No palpitations.      ECG (personally reviewed): NSR, LBBB 146 msec  Medtronic device interrogation: No atrial fibrillation, stable thoracic impedance.   Labs (2/21): K 3.8, creatinine 0.96, LDL 105   PMH: 1. Nonischemic cardiomyopathy: Echo (7/15) with severe LV dilation, EF 25% with diffuse hypokinesis, mild to moderate MR.  RHC/ LHC 08/08/13 with RA 4, PA 23/8, PCWP 11, CI 2.52; no CAD.  TEE (9/15) with EF 15-20%, severe global hypokinesis, no LV thrombus, moderate MR, normal RV.  Echo (10/2013): EF 25-30%, grade I DD, mild AI. Medtronic CRT-D (10/15).  Echo (2/16) with EF 25-30%, diffuse hypokinesis worse in the septum, grade I diastolic dysfunction, normal RV size and systolic function, mild-moderate MR.  - Echo (8/16) with EF 35-40%, diffuse hypokinesis, mild-moderate mitral regurgitation.  - Echo (12/17): EF 45-50%, mild LVH, mild to moderate MR.  - Echo (3/19): EF 45-50% with moderate LVH, mild MR, normal RV size and systolic function.  -  Echo (4/21): EF 30-35% with LAD territory wall motion abnormalities. 2. CVA: 9/15 with dysarthria.  Carotids 9/15 with no significant CAD.  - Carotid dopplers (10/17): mild plaque, normal subclavians.  3. LBBB: Medtronic CRT-D (10/2013) 4. Hyperlipidemia 5. Cholelithiasis 6. Peripheral arterial dopplers (6/19): Normal  SH: lives with her son and husband. Does not drink or smoke. Currently not working.   FH: Mom had A fib and Heart Failure. Deceased 37        Sister has lupus  ROS: All systems negative except as listed in HPI, PMH and Problem List.   Current Outpatient Medications  Medication Sig Dispense Refill  . albuterol (VENTOLIN HFA) 108 (90 Base) MCG/ACT inhaler Inhale 2 puffs into the lungs at bedtime as needed for wheezing or shortness of breath. 18 g 3  . ALPRAZolam (XANAX) 0.5 MG tablet Take 0.5 mg by mouth at bedtime as needed for anxiety or sleep.     [name] atorvastatin (LIPITOR) 40 MG tablet Take 1 tablet (40 mg total) by mouth daily at 6 PM. (Patient taking differently: Take 80 mg by mouth daily. ) 90 tablet 1  . azelastine (OPTIVAR) 0.05 % ophthalmic solution Place 1 drop into the left eye 2 (two) times daily. (Patient taking differently: Place 1 drop into the left eye daily as needed. ) 6 mL 12  . carvedilol (COREG) 6.25 MG tablet Take 1 tablet (6.25 mg total) by mouth 2 (two) times daily with a  meal. 180 tablet 3  . clopidogrel (PLAVIX) 75 MG tablet Take 1 tablet (75 mg total) by mouth daily. 90 tablet 3  . ENTRESTO 24-26 MG TAKE ONE TABLET BY MOUTH TWICE DAILY (Patient taking differently: Take 1 tablet by mouth 2 (two) times daily. May crush. May be given with or without food. Observe for signs and symptoms of angioedema or hypotension. Entresto (sacubitril/valsartan) should NOT be administered within 36-hours of ACE-inhibitor administration.) 180 tablet 3  . fluticasone (FLONASE) 50 MCG/ACT nasal spray Place 2 sprays into both nostrils daily. (Patient taking differently:  Place 2 sprays into both nostrils daily as needed for allergies or rhinitis. ) 16 g 0  . furosemide (LASIX) 40 MG tablet Take 1 tablet (40 mg total) by mouth daily. 90 tablet 3  . pantoprazole (PROTONIX) 40 MG tablet Take 40 mg by mouth daily.     . potassium chloride SA (K-DUR,KLOR-CON) 20 MEQ tablet Take 1 tablet (20 mEq total) by mouth daily. 90 tablet 3  . traMADol (ULTRAM) 50 MG tablet Take 50 mg by mouth every 6 (six) hours as needed for severe pain (arthiritis in knees and ankle).     . cetirizine (ZYRTEC ALLERGY) 10 MG tablet Take 10 mg by mouth daily.    [name] spironolactone (ALDACTONE) 25 MG tablet Take 0.5 tablets (12.5 mg total) by mouth at bedtime. 45 tablet 3   No current facility-administered medications for this encounter.    Vitals:   05/29/19 0949  BP: 122/86  Pulse: 64  SpO2: 98%  Weight: 88 kg (194 lb)    PHYSICAL EXAM: General: NAD Neck: No JVD, no thyromegaly or thyroid nodule.  Lungs: Clear to auscultation bilaterally with normal respiratory effort. CV: Nondisplaced PMI.  Heart regular S1/S2, no S3/S4, no murmur.  No peripheral edema.  No carotid bruit.  Normal pedal pulses.  Abdomen: Soft, nontender, no hepatosplenomegaly, no distention.  Skin: Intact without lesions or rashes.  Neurologic: Alert and oriented x 3.  Psych: Normal affect. Extremities: No clubbing or cyanosis.  HEENT: Normal.   ASSESSMENT & PLAN:  1. Chronic Systolic Heart Failure: Nonischemic cardiomyopathy s/p Medtronic CRT-D in 10/15, coronary angiography without CAD in 07/2013. Echo done in 2/16, EF 25-30%.  Repeat echo in 8/16 with EF up to 35-40%.  Echo in 4/19 showed EF up to 45-50%.  However, echo in 4/21 showed EF back down to 30-35%.  Of note, Medtronic device had been reprogrammed to VVI as LBBB has been only intermittent. However, recent ECGs have consistently shown LBBB. NYHA class II. She is not volume overloaded by exam.  - With LBBB appearing consistent, I think she should be  reprogrammed to BiV pace again.  Will discuss with Dr. Graciela Husbands.  - Continue Lasix 40 mg daily. BMET today.  - Continue Coreg 6.25 mg bid, she did not tolerate increase to 9.375 mg bid.   - Continue Entresto 24/26 bid.  I will not increase today, BP looks ok today but generally runs low.    - I will try her on spironolactone 12.5 daily, she will take qhs. BMET 10 days.  - With fall in EF and LAD territory WMAs, I am going to arrange for LHC/RHC.  We discussed risks/benefits and she agrees to procedure.  If no significant CAD is found, wall motion abnormalities may be related to LBBB.  Would like to resume BiV pacing (see above).  2. CVA: ?Embolic.  No atrial fibrillation has been detected so far on device.  She will continue  Plavix for now. If future device interrogation shows atrial fibrillation, she will need to be anticoagulated.   3. Hyperlipidemia: Goal LDL < 70 with history of CVA. - Increase atorvastatin to 80 mg daily with lipids/LFTs in 2 months.   Followup in 3 months with NP/PA.    Dalton McLean 05/29/2019   

## 2019-05-30 ENCOUNTER — Other Ambulatory Visit (HOSPITAL_COMMUNITY): Payer: Self-pay

## 2019-05-30 DIAGNOSIS — I5022 Chronic systolic (congestive) heart failure: Secondary | ICD-10-CM

## 2019-05-30 MED ORDER — SODIUM CHLORIDE 0.9% FLUSH: 3.0000 mL | Freq: Two times a day (BID) | INTRAVENOUS | Status: DC

## 2019-06-03 ENCOUNTER — Other Ambulatory Visit (HOSPITAL_COMMUNITY)
Admission: RE | Admit: 2019-06-03 | Discharge: 2019-06-03 | Disposition: A | Discharge status: Home / Self Care | Payer: Medicare HMO | Source: Ambulatory Visit | Attending: Cardiology | Admitting: Cardiology

## 2019-06-03 DIAGNOSIS — Z01812 Encounter for preprocedural laboratory examination: Secondary | ICD-10-CM | POA: Diagnosis present

## 2019-06-03 DIAGNOSIS — Z20822 Contact with and (suspected) exposure to covid-19: Secondary | ICD-10-CM | POA: Insufficient documentation

## 2019-06-03 LAB — SARS CORONAVIRUS 2 (TAT 6-24 HRS): SARS Coronavirus 2: NEGATIVE

## 2019-06-05 ENCOUNTER — Encounter (HOSPITAL_COMMUNITY)
Admission: RE | Disposition: A | Discharge status: Home / Self Care | Payer: Self-pay | Source: Home / Self Care | Attending: Cardiology

## 2019-06-05 ENCOUNTER — Other Ambulatory Visit: Payer: Self-pay

## 2019-06-05 ENCOUNTER — Ambulatory Visit (HOSPITAL_COMMUNITY)
Admission: RE | Admit: 2019-06-05 | Discharge: 2019-06-05 | Disposition: A | Discharge status: Home / Self Care | Payer: Medicare HMO | Attending: Cardiology | Admitting: Cardiology

## 2019-06-05 DIAGNOSIS — E785 Hyperlipidemia, unspecified: Secondary | ICD-10-CM | POA: Insufficient documentation

## 2019-06-05 DIAGNOSIS — Z79899 Other long term (current) drug therapy: Secondary | ICD-10-CM | POA: Insufficient documentation

## 2019-06-05 DIAGNOSIS — I5022 Chronic systolic (congestive) heart failure: Secondary | ICD-10-CM | POA: Diagnosis not present

## 2019-06-05 DIAGNOSIS — I447 Left bundle-branch block, unspecified: Secondary | ICD-10-CM | POA: Insufficient documentation

## 2019-06-05 DIAGNOSIS — I509 Heart failure, unspecified: Secondary | ICD-10-CM | POA: Diagnosis not present

## 2019-06-05 DIAGNOSIS — Z8673 Personal history of transient ischemic attack (TIA), and cerebral infarction without residual deficits: Secondary | ICD-10-CM | POA: Insufficient documentation

## 2019-06-05 DIAGNOSIS — I428 Other cardiomyopathies: Secondary | ICD-10-CM | POA: Insufficient documentation

## 2019-06-05 DIAGNOSIS — Z7902 Long term (current) use of antithrombotics/antiplatelets: Secondary | ICD-10-CM | POA: Insufficient documentation

## 2019-06-05 DIAGNOSIS — I429 Cardiomyopathy, unspecified: Secondary | ICD-10-CM | POA: Diagnosis not present

## 2019-06-05 HISTORY — PX: RIGHT/LEFT HEART CATH AND CORONARY ANGIOGRAPHY: CATH118266

## 2019-06-05 LAB — CBC
HCT: 38 % (ref 36.0–46.0)
Hemoglobin: 12.1 g/dL (ref 12.0–15.0)
MCH: 28.8 pg (ref 26.0–34.0)
MCHC: 31.8 g/dL (ref 30.0–36.0)
MCV: 90.5 fL (ref 80.0–100.0)
Platelets: 257 10*3/uL (ref 150–400)
RBC: 4.2 MIL/uL (ref 3.87–5.11)
RDW: 13.7 % (ref 11.5–15.5)
WBC: 9.2 10*3/uL (ref 4.0–10.5)
nRBC: 0 % (ref 0.0–0.2)

## 2019-06-05 LAB — POCT I-STAT EG7
Acid-Base Excess: 2 mmol/L (ref 0.0–2.0)
Acid-Base Excess: 2 mmol/L (ref 0.0–2.0)
Bicarbonate: 25.8 mmol/L (ref 20.0–28.0)
Bicarbonate: 26.4 mmol/L (ref 20.0–28.0)
Calcium, Ion: 1.13 mmol/L — ABNORMAL LOW (ref 1.15–1.40)
Calcium, Ion: 1.25 mmol/L (ref 1.15–1.40)
HCT: 30 % — ABNORMAL LOW (ref 36.0–46.0)
HCT: 32 % — ABNORMAL LOW (ref 36.0–46.0)
Hemoglobin: 10.2 g/dL — ABNORMAL LOW (ref 12.0–15.0)
Hemoglobin: 10.9 g/dL — ABNORMAL LOW (ref 12.0–15.0)
O2 Saturation: 74 %
O2 Saturation: 75 %
Potassium: 3.2 mmol/L — ABNORMAL LOW (ref 3.5–5.1)
Potassium: 3.4 mmol/L — ABNORMAL LOW (ref 3.5–5.1)
Sodium: 144 mmol/L (ref 135–145)
Sodium: 145 mmol/L (ref 135–145)
TCO2: 27 mmol/L (ref 22–32)
TCO2: 28 mmol/L (ref 22–32)
pCO2, Ven: 37.5 mmHg — ABNORMAL LOW (ref 44.0–60.0)
pCO2, Ven: 37.7 mmHg — ABNORMAL LOW (ref 44.0–60.0)
pH, Ven: 7.446 — ABNORMAL HIGH (ref 7.250–7.430)
pH, Ven: 7.453 — ABNORMAL HIGH (ref 7.250–7.430)
pO2, Ven: 38 mmHg (ref 32.0–45.0)
pO2, Ven: 38 mmHg (ref 32.0–45.0)

## 2019-06-05 SURGERY — RIGHT/LEFT HEART CATH AND CORONARY ANGIOGRAPHY
Anesthesia: LOCAL

## 2019-06-05 MED ORDER — IOHEXOL 350 MG/ML SOLN
INTRAVENOUS | Status: DC | PRN
  Administered 2019-06-05: 60 mL via INTRA_ARTERIAL

## 2019-06-05 MED ORDER — HEPARIN SODIUM (PORCINE) 1000 UNIT/ML IJ SOLN
INTRAMUSCULAR | Status: AC
  Filled 2019-06-05: qty 1

## 2019-06-05 MED ORDER — ACETAMINOPHEN 325 MG PO TABS: 650.0000 mg | ORAL_TABLET | ORAL | Status: DC | PRN

## 2019-06-05 MED ORDER — SODIUM CHLORIDE 0.9 % IV SOLN: INTRAVENOUS | Status: DC

## 2019-06-05 MED ORDER — VERAPAMIL HCL 2.5 MG/ML IV SOLN
INTRAVENOUS | Status: DC | PRN
  Administered 2019-06-05: 10 mL via INTRA_ARTERIAL

## 2019-06-05 MED ORDER — SODIUM CHLORIDE 0.9 % IV SOLN: 250.0000 mL | INTRAVENOUS | Status: DC | PRN

## 2019-06-05 MED ORDER — LIDOCAINE HCL (PF) 1 % IJ SOLN
INTRAMUSCULAR | Status: DC | PRN
  Administered 2019-06-05 (×2): 2 mL

## 2019-06-05 MED ORDER — LIDOCAINE HCL (PF) 1 % IJ SOLN
INTRAMUSCULAR | Status: AC
  Filled 2019-06-05: qty 30

## 2019-06-05 MED ORDER — SODIUM CHLORIDE 0.9% FLUSH: 3.0000 mL | Freq: Two times a day (BID) | INTRAVENOUS | Status: DC

## 2019-06-05 MED ORDER — LABETALOL HCL 5 MG/ML IV SOLN: 10.0000 mg | INTRAVENOUS | Status: DC | PRN

## 2019-06-05 MED ORDER — MIDAZOLAM HCL 2 MG/2ML IJ SOLN
INTRAMUSCULAR | Status: DC | PRN
  Administered 2019-06-05 (×2): 1 mg via INTRAVENOUS

## 2019-06-05 MED ORDER — HEPARIN (PORCINE) IN NACL 1000-0.9 UT/500ML-% IV SOLN
INTRAVENOUS | Status: AC
  Filled 2019-06-05: qty 500

## 2019-06-05 MED ORDER — HYDRALAZINE HCL 20 MG/ML IJ SOLN: 10.0000 mg | INTRAMUSCULAR | Status: DC | PRN

## 2019-06-05 MED ORDER — HEPARIN (PORCINE) IN NACL 1000-0.9 UT/500ML-% IV SOLN
INTRAVENOUS | Status: AC
  Filled 2019-06-05: qty 1000

## 2019-06-05 MED ORDER — HEPARIN (PORCINE) IN NACL 1000-0.9 UT/500ML-% IV SOLN
INTRAVENOUS | Status: DC | PRN
  Administered 2019-06-05 (×2): 500 mL

## 2019-06-05 MED ORDER — SODIUM CHLORIDE 0.9 % WEIGHT BASED INFUSION: 1.0000 mL/kg/h | INTRAVENOUS | Status: DC

## 2019-06-05 MED ORDER — ONDANSETRON HCL 4 MG/2ML IJ SOLN: 4.0000 mg | Freq: Four times a day (QID) | INTRAMUSCULAR | Status: DC | PRN

## 2019-06-05 MED ORDER — ASPIRIN 81 MG PO CHEW
81.0000 mg | CHEWABLE_TABLET | ORAL | Status: AC
  Administered 2019-06-05: 81 mg via ORAL
  Filled 2019-06-05: qty 1

## 2019-06-05 MED ORDER — VERAPAMIL HCL 2.5 MG/ML IV SOLN
INTRAVENOUS | Status: AC
  Filled 2019-06-05: qty 2

## 2019-06-05 MED ORDER — FENTANYL CITRATE (PF) 100 MCG/2ML IJ SOLN
INTRAMUSCULAR | Status: AC
  Filled 2019-06-05: qty 2

## 2019-06-05 MED ORDER — HEPARIN SODIUM (PORCINE) 1000 UNIT/ML IJ SOLN
INTRAMUSCULAR | Status: DC | PRN
  Administered 2019-06-05: 4000 [IU] via INTRAVENOUS

## 2019-06-05 MED ORDER — SODIUM CHLORIDE 0.9% FLUSH: 3.0000 mL | INTRAVENOUS | Status: DC | PRN

## 2019-06-05 MED ORDER — MIDAZOLAM HCL 2 MG/2ML IJ SOLN
INTRAMUSCULAR | Status: AC
  Filled 2019-06-05: qty 2

## 2019-06-05 MED ORDER — FENTANYL CITRATE (PF) 100 MCG/2ML IJ SOLN
INTRAMUSCULAR | Status: DC | PRN
  Administered 2019-06-05 (×2): 25 ug via INTRAVENOUS

## 2019-06-05 SURGICAL SUPPLY — 10 items
CATH 5FR JL3.5 JR4 ANG PIG MP (CATHETERS) ×2 IMPLANT
CATH BALLN WEDGE 5F 110CM (CATHETERS) ×2 IMPLANT
DEVICE RAD COMP TR BAND LRG (VASCULAR PRODUCTS) ×2 IMPLANT
GLIDESHEATH SLEND SS 6F .021 (SHEATH) ×2 IMPLANT
GUIDEWIRE INQWIRE 1.5J.035X260 (WIRE) ×1 IMPLANT
INQWIRE 1.5J .035X260CM (WIRE) ×2
KIT HEART LEFT (KITS) ×2 IMPLANT
PACK CARDIAC CATHETERIZATION (CUSTOM PROCEDURE TRAY) ×2 IMPLANT
SHEATH GLIDE SLENDER 4/5FR (SHEATH) ×2 IMPLANT
TRANSDUCER W/STOPCOCK (MISCELLANEOUS) ×2 IMPLANT

## 2019-06-05 NOTE — Discharge Instructions (Signed)
DRINK PLENTY OF FLUIDS FOR THE NEXT 2-3 DAYS.  KEEP ARM ELEVATED THE REMAINDER OF THE DAY.  Radial Site Care  This sheet gives you information about how to care for yourself after your procedure. Your health care provider may also give you more specific instructions. If you have problems or questions, contact your health care provider. What can I expect after the procedure? After the procedure, it is common to have:  Bruising and tenderness at the catheter insertion area. Follow these instructions at home: Medicines  Take over-the-counter and prescription medicines only as told by your health care provider. Insertion site care 1. Follow instructions from your health care provider about how to take care of your insertion site. Make sure you: ? Wash your hands with soap and water before you change your bandage (dressing). If soap and water are not available, use hand sanitizer. ? Change your dressing as told by your health care provider. 2. Check your insertion site every day for signs of infection. Check for: ? Redness, swelling, or pain. ? Fluid or blood. ? Pus or a bad smell. ? Warmth. 3. Do not take baths, swim, or use a hot tub for 5 days. 4. You may shower 24-48 hours after the procedure. ? Remove the dressing and gently wash the site with plain soap and water. ? Pat the area dry with a clean towel. ? Do not rub the site. That could cause bleeding. 5. Do not apply powder or lotion to the site. Activity  1. For 24 hours after the procedure, or as directed by your health care provider: ? Do not flex or bend the affected arm. ? Do not push or pull heavy objects with the affected arm. ? Do not drive yourself home from the hospital or clinic. You may drive 24 hours after the procedure. ? Do not operate machinery or power tools. 2. Do not push, pull or lift anything that is heavier than 10 lb for 5 days. 3. Ask your health care provider when it is okay to: ? Return to work or  school. ? Resume usual physical activities or sports. ? Resume sexual activity. General instructions  If the catheter site starts to bleed, raise your arm and put firm pressure on the site. If the bleeding does not stop, get help right away. This is a medical emergency.  If you went home on the same day as your procedure, a responsible adult should be with you for the first 24 hours after you arrive home.  Keep all follow-up visits as told by your health care provider. This is important. Contact a health care provider if:  You have a fever.  You have redness, swelling, or yellow drainage around your insertion site. Get help right away if:  You have unusual pain at the radial site.  The catheter insertion area swells very fast.  The insertion area is bleeding, and the bleeding does not stop when you hold steady pressure on the area.  Your arm or hand becomes pale, cool, tingly, or numb. These symptoms may represent a serious problem that is an emergency. Do not wait to see if the symptoms will go away. Get medical help right away. Call your local emergency services (911 in the [place] Do not drive yourself to the hospital. Summary  After the procedure, it is common to have bruising and tenderness at the site.  Follow instructions from your health care provider about how to take care of your radial site wound. Check   the wound every day for signs of infection.  Do not push, pull or lift anything that is heavier than 10 lb for 5 days.  This information is not intended to replace advice given to you by your health care provider. Make sure you discuss any questions you have with your health care provider. Document Revised: 02/07/2017 Document Reviewed: 02/07/2017 Elsevier Patient Education  2020 [employer] 

## 2019-06-05 NOTE — Interval H&P Note (Signed)
History and Physical Interval Note:  06/05/2019 12:53 PM  [name]  has presented today for surgery, with the diagnosis of heart failure.  The various methods of treatment have been discussed with the patient and family. After consideration of risks, benefits and other options for treatment, the patient has consented to  Procedure(s): RIGHT/LEFT HEART CATH AND CORONARY ANGIOGRAPHY (N/A) as a surgical intervention.  The patient's history has been reviewed, patient examined, no change in status, stable for surgery.  I have reviewed the patient's chart and labs.  Questions were answered to the patient's satisfaction.     Rilie Glanz [employer]

## 2019-06-09 ENCOUNTER — Ambulatory Visit (HOSPITAL_COMMUNITY)
Admission: RE | Admit: 2019-06-09 | Discharge: 2019-06-09 | Disposition: A | Discharge status: Home / Self Care | Payer: Medicare HMO | Source: Ambulatory Visit | Attending: Cardiology | Admitting: Cardiology

## 2019-06-09 ENCOUNTER — Other Ambulatory Visit: Payer: Self-pay

## 2019-06-09 ENCOUNTER — Telehealth (HOSPITAL_COMMUNITY): Payer: Self-pay | Admitting: *Deleted

## 2019-06-09 DIAGNOSIS — I428 Other cardiomyopathies: Secondary | ICD-10-CM | POA: Diagnosis not present

## 2019-06-09 LAB — BASIC METABOLIC PANEL
Anion gap: 10 (ref 5–15)
BUN: 10 mg/dL (ref 8–23)
CO2: 21 mmol/L — ABNORMAL LOW (ref 22–32)
Calcium: 8.9 mg/dL (ref 8.9–10.3)
Chloride: 106 mmol/L (ref 98–111)
Creatinine, Ser: 0.95 mg/dL (ref 0.44–1.00)
GFR calc Af Amer: >60 mL/min (ref >60)
GFR calc non Af Amer: >60 mL/min (ref >60)
Glucose, Bld: 109 mg/dL — ABNORMAL HIGH (ref 70–99)
Potassium: 3.6 mmol/L (ref 3.5–5.1)
Sodium: 137 mmol/L (ref 135–145)

## 2019-06-09 NOTE — Telephone Encounter (Signed)
Pt called asking how long does she need to keep her arm elevated after a cath. Per Dr.McLean pt does not need to keep her arm elevated just avoid lifting anything over 15lbs for 2 weeks. Pt verbalized understanding.

## 2019-06-13 ENCOUNTER — Other Ambulatory Visit: Payer: Self-pay | Admitting: General Practice

## 2019-06-13 DIAGNOSIS — Z1231 Encounter for screening mammogram for malignant neoplasm of breast: Secondary | ICD-10-CM

## 2019-06-18 NOTE — Progress Notes (Signed)
Patient ID: EMELDA KOHLBECK, female   DOB: 04/12/1950, 69 y.o.   MRN: [id number] PCP: Dr Juanito Doom EP: Dr Caryl Comes.  Cardiologist: Dr Aundra Dubin  Neurologist: Dr Leonie Man  HPI: Ms. Faubert is a 69 y.o. female with chronic systolic CHF with nonischemic cardiomypathy (no significant CAD) s/p Medtronic CRT-D, LBBB, mild-mod MR and CVA (09/2013).  Coronary angiography in 7/15 showed no significant CAD.  Admitted in 9/15 with right sided facial droop and expressive aphasia, CVA on MRI. Had TEE showing EF 15%.    QRS narrowed, so in 11/16 device was reprogrammed to VVI.   Echo in 12/17 showed EF up to 45-50%.  Echo in 3/19 showed EF 45-50% with moderate LVH and normal RV.   Echo was done in 4/21 showing EF decreased to 30-35% with LAD territory wall motion abnormalities.   Had heart cath 06/05/19 with minimal CAD and preserved cardiac output.   Today she returns for HF follow up with her husband. Overall feeling fine. Denies SOB/PND/Orthopnea. Appetite ok. No fever or chills. Weight at home down from 192-->188  pounds. Taking all medications.  Medtronic device interrogation: Activity ~ 1 hour per day. No VT. No A fib.   Labs (2/21): K 3.8, creatinine 0.96, LDL 105   PMH: 1. Nonischemic cardiomyopathy: Echo (7/15) with severe LV dilation, EF 25% with diffuse hypokinesis, mild to moderate MR.  RHC/ LHC 08/08/13 with RA 4, PA 23/8, PCWP 11, CI 2.52; no CAD.  TEE (9/15) with EF 15-20%, severe global hypokinesis, no LV thrombus, moderate MR, normal RV.  Echo (10/2013): EF 25-30%, grade I DD, mild AI. Medtronic CRT-D (10/15).  Echo (2/16) with EF 25-30%, diffuse hypokinesis worse in the septum, grade I diastolic dysfunction, normal RV size and systolic function, mild-moderate MR.  - Echo (8/16) with EF 35-40%, diffuse hypokinesis, mild-moderate mitral regurgitation.  - Echo (12/17): EF 45-50%, mild LVH, mild to moderate MR.  - Echo (3/19): EF 45-50% with moderate LVH, mild MR, normal RV size and systolic function.   - Echo (4/21): EF 30-35% with LAD territory wall motion abnormalities. -RHC/LHC 06/05/2019 minimal CAD and preserved cadiac output.  2. CVA: 9/15 with dysarthria.  Carotids 9/15 with no significant CAD.  - Carotid dopplers (10/17): mild plaque, normal subclavians.  3. LBBB: Medtronic CRT-D (10/2013) 4. Hyperlipidemia  5. Cholelithiasis 6. Peripheral arterial dopplers (6/19): Normal  SH: lives with her son and husband. Does not drink or smoke. Currently not working.   FH: Mom had A fib and Heart Failure. Deceased 12        Sister has lupus  ROS: All systems negative except as listed in HPI, PMH and Problem List.   Current Outpatient Medications  Medication Sig Dispense Refill   albuterol (VENTOLIN HFA) 108 (90 Base) MCG/ACT inhaler Inhale 2 puffs into the lungs at bedtime as needed for wheezing or shortness of breath. 18 g 3   ALPRAZolam (XANAX) 0.5 MG tablet Take 0.5 mg by mouth at bedtime as needed for anxiety or sleep.      atorvastatin (LIPITOR) 40 MG tablet Take 1 tablet (40 mg total) by mouth daily at 6 PM. (Patient taking differently: Take 80 mg by mouth daily. ) 90 tablet 1   carvedilol (COREG) 6.25 MG tablet Take 1 tablet (6.25 mg total) by mouth 2 (two) times daily with a meal. 180 tablet 3   clopidogrel (PLAVIX) 75 MG tablet Take 1 tablet (75 mg total) by mouth daily. 90 tablet 3   ENTRESTO 24-26 MG TAKE ONE  TABLET BY MOUTH TWICE DAILY (Patient taking differently: Take 1 tablet by mouth 2 (two) times daily. May crush. May be given with or without food. Observe for signs and symptoms of angioedema or hypotension. Entresto (sacubitril/valsartan) should NOT be administered within 36-hours of ACE-inhibitor administration.) 180 tablet 3   fluticasone (FLONASE) 50 MCG/ACT nasal spray Place 2 sprays into both nostrils daily. (Patient taking differently: Place 2 sprays into both nostrils daily as needed for allergies or rhinitis. ) 16 g 0   furosemide (LASIX) 40 MG tablet  Take 1 tablet (40 mg total) by mouth daily. 90 tablet 3   pantoprazole (PROTONIX) 40 MG tablet Take 40 mg by mouth daily.      potassium chloride SA (K-DUR,KLOR-CON) 20 MEQ tablet Take 1 tablet (20 mEq total) by mouth daily. 90 tablet 3   spironolactone (ALDACTONE) 25 MG tablet Take 0.5 tablets (12.5 mg total) by mouth at bedtime. 45 tablet 3   traMADol (ULTRAM) 50 MG tablet Take 50 mg by mouth every 6 (six) hours as needed for severe pain (arthiritis in knees and ankle).      cetirizine (ZYRTEC ALLERGY) 10 MG tablet Take 10 mg by mouth daily.     No current facility-administered medications for this encounter.    Vitals:   06/19/19 0915  BP: 106/72  Pulse: 86  SpO2: 98%  Weight: 87.1 kg (192 lb)   Wt Readings from Last 3 Encounters:  06/19/19 87.1 kg (192 lb)  06/05/19 86.2 kg (190 lb)  05/29/19 88 kg (194 lb)     PHYSICAL EXAM: General:  Well appearing. No resp difficulty. Walked in the clinic.  HEENT: normal Neck: supple. no JVD. Carotids 2+ bilat; no bruits. No lymphadenopathy or thryomegaly appreciated. Cor: PMI nondisplaced. Regular rate & rhythm. No rubs, gallops or murmurs. Lungs: clear Abdomen: soft, nontender, nondistended. No hepatosplenomegaly. No bruits or masses. Good bowel sounds. Extremities: no cyanosis, clubbing, rash, edema Neuro: alert & orientedx3, cranial nerves grossly intact. moves all 4 extremities w/o difficulty. Affect pleasant  EKG: SR with LBBB 144 ms  ASSESSMENT & PLAN:  1. Chronic Systolic Heart Failure: Nonischemic cardiomyopathy s/p Medtronic CRT-D in 10/15, coronary angiography without CAD in 07/2013. Echo done in 2/16, EF 25-30%.  Repeat echo in 8/16 with EF up to 35-40%.  Echo in 4/19 showed EF up to 45-50%.  However, echo in 4/21 showed EF back down to 30-35%.  Of note, Medtronic device had been reprogrammed to VVI as LBBB has been only intermittent. However, recent ECGs have consistently shown LBBB.  - Continue Lasix 40 mg daily. -  Continue Coreg 6.25 mg bid, she did not tolerate increase to 9.375 mg bid.   - Continue Entresto 24/26 bid.   - Increase spiro 25 mg daily.  - Check BMET in 7-10 days.  - With fall in EF and LAD territory WMAs, had RHC/LHC withpreserved cardiac output and minimal CO.  If no significant CAD is found, wall motion abnormalities may be related to LBBB.  Would like to resume BiV pacing (see above).  Refer back to EP  2. CVA: ?Embolic.  No atrial fibrillation has been detected so far on device.  She will continue Plavix for now. If future device interrogation shows atrial fibrillation, she will need to be anticoagulated.   3. Hyperlipidemia: Goal LDL < 70 with history of CVA. - Continue  atorvastatin to 80 mg daily with lipids/LFTs in 6 weeks   Follow up with Dr Shirlee Latch in 3 months. EP appointment  was set up.  Greater than 50% of the (total minutes 25) visit spent in counseling/coordination of care regarding the above. Discussed  with medtronic interrogation.    Lynze Reddy NP-C  06/19/2019

## 2019-06-19 ENCOUNTER — Encounter (HOSPITAL_COMMUNITY): Payer: Self-pay

## 2019-06-19 ENCOUNTER — Ambulatory Visit (HOSPITAL_COMMUNITY)
Admission: RE | Admit: 2019-06-19 | Discharge: 2019-06-19 | Disposition: A | Discharge status: Home / Self Care | Payer: Medicare HMO | Source: Ambulatory Visit | Attending: Adult Health | Admitting: Adult Health

## 2019-06-19 ENCOUNTER — Other Ambulatory Visit: Payer: Self-pay

## 2019-06-19 VITALS — BP 106/72 | HR 86 | Wt 192.0 lb

## 2019-06-19 DIAGNOSIS — Z7902 Long term (current) use of antithrombotics/antiplatelets: Secondary | ICD-10-CM | POA: Diagnosis not present

## 2019-06-19 DIAGNOSIS — I2510 Atherosclerotic heart disease of native coronary artery without angina pectoris: Secondary | ICD-10-CM | POA: Insufficient documentation

## 2019-06-19 DIAGNOSIS — Z8249 Family history of ischemic heart disease and other diseases of the circulatory system: Secondary | ICD-10-CM | POA: Diagnosis not present

## 2019-06-19 DIAGNOSIS — Z8673 Personal history of transient ischemic attack (TIA), and cerebral infarction without residual deficits: Secondary | ICD-10-CM | POA: Diagnosis not present

## 2019-06-19 DIAGNOSIS — Z95810 Presence of automatic (implantable) cardiac defibrillator: Secondary | ICD-10-CM

## 2019-06-19 DIAGNOSIS — E785 Hyperlipidemia, unspecified: Secondary | ICD-10-CM | POA: Insufficient documentation

## 2019-06-19 DIAGNOSIS — I428 Other cardiomyopathies: Secondary | ICD-10-CM | POA: Diagnosis not present

## 2019-06-19 DIAGNOSIS — I5022 Chronic systolic (congestive) heart failure: Secondary | ICD-10-CM | POA: Diagnosis not present

## 2019-06-19 DIAGNOSIS — I447 Left bundle-branch block, unspecified: Secondary | ICD-10-CM | POA: Diagnosis not present

## 2019-06-19 DIAGNOSIS — I5020 Unspecified systolic (congestive) heart failure: Secondary | ICD-10-CM | POA: Diagnosis not present

## 2019-06-19 DIAGNOSIS — Z79899 Other long term (current) drug therapy: Secondary | ICD-10-CM | POA: Insufficient documentation

## 2019-06-19 MED ORDER — SPIRONOLACTONE 25 MG PO TABS: 25.0000 mg | ORAL_TABLET | Freq: Every day | ORAL | 3 refills | Status: DC

## 2019-06-19 NOTE — Patient Instructions (Addendum)
INCREASE Spironolactone to 25mg  (1 tab) at bedtime  Labs in 1 week We will only contact you if something comes back abnormal or we need to make some changes. Otherwise no news is good news!  Please call Dr office to schedule an appointment as soon as possible. His office number is [phone]  Your physician recommends that you schedule a follow-up appointment in: 3 months with Dr [phone]  Please call office at [phone] option [phone] if you have any questions or concerns.   At [facility], you and your health needs are our priority. As part of our continuing mission to provide you with exceptional heart care, we have created designated Provider Care Teams. These Care Teams include your primary Cardiologist (physician) and Advanced Practice Providers (APPs- Physician Assistants and Nurse Practitioners) who all work together to provide you with the care you need, when you need it.   You may see any of the following providers on your designated Care Team at your next follow up: [phone] Dr [name] . Dr Arvilla Meres . [name], NP . Tonye Becket, PA . Robbie Lis, PharmD   Please be sure to bring in all your medications bottles to every appointment.

## 2019-06-26 ENCOUNTER — Ambulatory Visit: Payer: Medicare HMO

## 2019-06-30 ENCOUNTER — Ambulatory Visit (HOSPITAL_COMMUNITY)
Admission: RE | Admit: 2019-06-30 | Discharge: 2019-06-30 | Disposition: A | Discharge status: Home / Self Care | Payer: Medicare HMO | Source: Ambulatory Visit | Attending: Internal Medicine | Admitting: Internal Medicine

## 2019-06-30 ENCOUNTER — Other Ambulatory Visit: Payer: Self-pay

## 2019-06-30 DIAGNOSIS — I5020 Unspecified systolic (congestive) heart failure: Secondary | ICD-10-CM | POA: Diagnosis not present

## 2019-06-30 LAB — BASIC METABOLIC PANEL
Anion gap: 8 (ref 5–15)
BUN: 11 mg/dL (ref 8–23)
CO2: 25 mmol/L (ref 22–32)
Calcium: 8.8 mg/dL — ABNORMAL LOW (ref 8.9–10.3)
Chloride: 105 mmol/L (ref 98–111)
Creatinine, Ser: 0.96 mg/dL (ref 0.44–1.00)
GFR calc Af Amer: >60 mL/min (ref >60)
GFR calc non Af Amer: 60 mL/min — ABNORMAL LOW (ref >60)
Glucose, Bld: 148 mg/dL — ABNORMAL HIGH (ref 70–99)
Potassium: 3.5 mmol/L (ref 3.5–5.1)
Sodium: 138 mmol/L (ref 135–145)

## 2019-07-07 ENCOUNTER — Ambulatory Visit (INDEPENDENT_AMBULATORY_CARE_PROVIDER_SITE_OTHER): Payer: Medicare HMO | Admitting: *Deleted

## 2019-07-07 DIAGNOSIS — I428 Other cardiomyopathies: Secondary | ICD-10-CM

## 2019-07-07 LAB — CUP PACEART REMOTE DEVICE CHECK
Battery Remaining Longevity: 34 mo
Battery Voltage: 2.95 V
Brady Statistic AP VP Percent: 0 %
Brady Statistic AP VS Percent: 0 %
Brady Statistic AS VP Percent: 0 %
Brady Statistic AS VS Percent: 100 %
Brady Statistic RA Percent Paced: 0 %
Brady Statistic RV Percent Paced: 0.01 %
Date Time Interrogation Session: 20210621121807
HighPow Impedance: 106 Ohm
Implantable Lead Implant Date: 20151021
Implantable Lead Implant Date: 20151021
Implantable Lead Implant Date: 20151021
Implantable Lead Location: 753858
Implantable Lead Location: 753859
Implantable Lead Location: 753860
Implantable Lead Model: 4298
Implantable Lead Model: 5076
Implantable Pulse Generator Implant Date: 20151021
Lead Channel Impedance Value: 1007 Ohm
Lead Channel Impedance Value: 418 Ohm
Lead Channel Impedance Value: 418 Ohm
Lead Channel Impedance Value: 475 Ohm
Lead Channel Impedance Value: 475 Ohm
Lead Channel Impedance Value: 513 Ohm
Lead Channel Impedance Value: 589 Ohm
Lead Channel Impedance Value: 703 Ohm
Lead Channel Impedance Value: 722 Ohm
Lead Channel Impedance Value: 817 Ohm
Lead Channel Impedance Value: 893 Ohm
Lead Channel Impedance Value: 893 Ohm
Lead Channel Impedance Value: 988 Ohm
Lead Channel Pacing Threshold Amplitude: 0.375 V
Lead Channel Pacing Threshold Amplitude: 0.75 V
Lead Channel Pacing Threshold Amplitude: 1.125 V
Lead Channel Pacing Threshold Pulse Width: 0.4 ms
Lead Channel Pacing Threshold Pulse Width: 0.4 ms
Lead Channel Pacing Threshold Pulse Width: 0.5 ms
Lead Channel Sensing Intrinsic Amplitude: 4.375 mV
Lead Channel Sensing Intrinsic Amplitude: 4.375 mV
Lead Channel Sensing Intrinsic Amplitude: 9.125 mV
Lead Channel Sensing Intrinsic Amplitude: 9.125 mV
Lead Channel Setting Pacing Amplitude: 2 V
Lead Channel Setting Pacing Pulse Width: 0.4 ms
Lead Channel Setting Sensing Sensitivity: 0.3 mV

## 2019-07-08 NOTE — Progress Notes (Signed)
Remote ICD transmission.   

## 2019-08-08 ENCOUNTER — Encounter (HOSPITAL_COMMUNITY): Payer: Self-pay

## 2019-09-17 NOTE — Progress Notes (Signed)
Patient Care Team: Karl Ito, DO as PCP - General (General Practice) [name], MD as PCP - Advanced Heart Failure (Cardiology)   HPI  [name] is a 69 y.o. female seen in followup for Medtronic CRT-D 2015 for NICM; intermittent LBBB and CHF programmed RV only pacing    Now with more constant LBBB Dr DM has recommended CRT activation.    She is S/p CVA without interval detection of atrial fibrillation.     The patient denies chest pain but modest shortness of breath. Some edema and fatibue      DATE TEST EF   7/15 LHC  No Angiographic CAD  7 /15 Echo  20-25 %   12/17 Echo 45-50 %   3/19 Echo  45-50%   4/21 Echo  30-35%   5/21 LHC  No obstructive CAD    Date Cr K Hgb  4/18 0.8 3.4   10/19  0.89 4.3   6/20 0.83 3.7   6/21 0.96 3.5 10.2        Past Medical History:  Diagnosis Date  . Arthritis   . Automatic implantable cardioverter-defibrillator in situ   . Cardiac resynchronization therapy defibrillator (CRT-D) in place    a. placed on 11/05/13.  [name] GERD (gastroesophageal reflux disease)   . HLD (hyperlipidemia)   . Hypotension   . LBBB (left bundle branch block)   . Mitral regurgitation    a. Mild-mod MR by echo 07/2013.  [name] PONV (postoperative nausea and vomiting)   . Stroke (HCC) 09/23/2013  . Systolic CHF (HCC)    a. Dx 07/2013 - due to NICM. Cath normal cors. EF 25%.    Past Surgical History:  Procedure Laterality Date  . BI-VENTRICULAR IMPLANTABLE CARDIOVERTER DEFIBRILLATOR N/A 11/05/2013   Procedure: BI-VENTRICULAR IMPLANTABLE CARDIOVERTER DEFIBRILLATOR  (CRT-D);  Surgeon: Duke Salvia, MD;  Location: [facility] CATH LAB;  Service: Cardiovascular;  Laterality: N/A;  . BI-VENTRICULAR IMPLANTABLE CARDIOVERTER DEFIBRILLATOR  (CRT-D)  11/05/2013  . CARDIAC CATHETERIZATION  07/2013  . KNEE ARTHROSCOPY Right   . LEFT AND RIGHT HEART CATHETERIZATION WITH CORONARY ANGIOGRAM N/A 08/08/2013   Procedure: LEFT AND RIGHT HEART CATHETERIZATION WITH  CORONARY ANGIOGRAM;  Surgeon: [name], MD;  Location: [facility] CATH LAB;  Service: Cardiovascular;  Laterality: N/A;  . RIGHT/LEFT HEART CATH AND CORONARY ANGIOGRAPHY N/A 06/05/2019   Procedure: RIGHT/LEFT HEART CATH AND CORONARY ANGIOGRAPHY;  Surgeon: [name], MD;  Location: [facility] INVASIVE CV LAB;  Service: Cardiovascular;  Laterality: N/A;  . TEE WITHOUT CARDIOVERSION N/A 09/26/2013   Procedure: TRANSESOPHAGEAL ECHOCARDIOGRAM (TEE);  Surgeon: [name], MD;  Location: [facility] ENDOSCOPY;  Service: Cardiovascular;  Laterality: N/A;  . TONSILLECTOMY    . WRIST ARTHROSCOPY  12/21/2010   Procedure: ARTHROSCOPY WRIST;  Surgeon: [name], MD;  Location: [facility];  Service: Orthopedics;  Laterality: Right;  right wrist, repair TFCC on right, open reconstruction ECU sheath    Current Outpatient Medications  Medication Sig Dispense Refill  . albuterol (VENTOLIN HFA) 108 (90 Base) MCG/ACT inhaler Inhale 2 puffs into the lungs at bedtime as needed for wheezing or shortness of breath. 18 g 3  . ALPRAZolam (XANAX) 0.5 MG tablet Take 0.5 mg by mouth at bedtime as needed for anxiety or sleep.     [name] atorvastatin (LIPITOR) 40 MG tablet Take 1 tablet (40 mg total) by mouth daily at 6 PM. 90 tablet 1  . carvedilol (COREG) 6.25 MG tablet Take  1 tablet (6.25 mg total) by mouth 2 (two) times daily with a meal. 180 tablet 3  . cetirizine (ZYRTEC ALLERGY) 10 MG tablet Take 10 mg by mouth daily.    . clopidogrel (PLAVIX) 75 MG tablet Take 1 tablet (75 mg total) by mouth daily. 90 tablet 3  . ENTRESTO 24-26 MG TAKE ONE TABLET BY MOUTH TWICE DAILY 180 tablet 3  . fluticasone (FLONASE) 50 MCG/ACT nasal spray Place 2 sprays into both nostrils daily. 16 g 0  . furosemide (LASIX) 40 MG tablet Take 1 tablet (40 mg total) by mouth daily. 90 tablet 3  . pantoprazole (PROTONIX) 40 MG tablet Take 40 mg by mouth daily.     . potassium chloride SA (K-DUR,KLOR-CON) 20 MEQ tablet Take 1 tablet (20  mEq total) by mouth daily. 90 tablet 3  . spironolactone (ALDACTONE) 25 MG tablet Take 1 tablet (25 mg total) by mouth at bedtime. 90 tablet 3  . traMADol (ULTRAM) 50 MG tablet Take 50 mg by mouth every 6 (six) hours as needed for severe pain (arthiritis in knees and ankle).      No current facility-administered medications for this visit.    Allergies  Allergen Reactions  . Onion Other (See Comments)    gas  . Tomato Other (See Comments)    reflux  . Voltaren [Diclofenac] Rash    Review of Systems negative except from HPI and PMH  Physical Exam BP 126/76   Pulse 89   Ht 5\' 4"  (1.626 m)   Wt 188 lb 3.2 oz (85.4 kg)   SpO2 95%   BMI 32.30 kg/m  Well developed and well nourished in no acute distress HENT normal Neck supple with JVP-flat Clear Device pocket well healed; without hematoma or erythema.  There is no tethering  Regular rate and rhythm, no murmur Abd-soft with active BS No Clubbing cyanosis edema Skin-warm and dry A & Oriented  Grossly normal sensory and motor function  ECG sinus rhythm with QRS duration of 148 ms left bundle branch block Multiple reprogramming's were attempted.  Unfortunately at 3.5 V every vector attempted resulted in diaphragmatic stimulation.  We then elected 1-4 at 1.5 V at 1.0 ms.  This resulted in narrowing of the QRS by about 20 ms.  At no point could I get QRS upright in lead V1 or negative in lead I without diaphragmatic stimulation    Assessment and  Plan   Nonischemic cardio myopathy  ICD-CRT eight  Congestive heart failure-chronic systolic  Left bundle branch block   Prior stroke with residual weakness    No intercurrent atrial fibrillation.  Class III heart failure.  Was unable to find a vector that was not associated with diaphragmatic stimulation at high output.  1-4 with associated narrowing of the QRS; I was unable to flip the QRS vector in lead V1 or in lead I Thought would see how that went and further  reprogramming can be attempted later if need be

## 2019-09-19 ENCOUNTER — Ambulatory Visit (INDEPENDENT_AMBULATORY_CARE_PROVIDER_SITE_OTHER): Payer: Medicare HMO | Admitting: Internal Medicine

## 2019-09-19 ENCOUNTER — Encounter: Payer: Self-pay | Admitting: Internal Medicine

## 2019-09-19 ENCOUNTER — Other Ambulatory Visit: Payer: Self-pay

## 2019-09-19 VITALS — BP 126/76 | HR 89 | Ht 64.0 in | Wt 188.2 lb

## 2019-09-19 DIAGNOSIS — I447 Left bundle-branch block, unspecified: Secondary | ICD-10-CM

## 2019-09-19 DIAGNOSIS — I428 Other cardiomyopathies: Secondary | ICD-10-CM | POA: Diagnosis not present

## 2019-09-19 NOTE — Patient Instructions (Signed)
Medication Instructions:  °Your physician recommends that you continue on your current medications as directed. Please refer to the Current Medication list given to you today. ° °*If you need a refill on your cardiac medications before your next appointment, please call your pharmacy* ° ° °Lab Work: °none °If you have labs (blood work) drawn today and your tests are completely normal, you will receive your results only by: °• MyChart Message (if you have MyChart) OR °• A paper copy in the mail °If you have any lab test that is abnormal or we need to change your treatment, we will call you to review the results. ° ° °Testing/Procedures: °none ° ° °Follow-Up: °At [facility], you and your health needs are our priority.  As part of our continuing mission to provide you with exceptional heart care, we have created designated Provider Care Teams.  These Care Teams include your primary Cardiologist (physician) and Advanced Practice Providers (APPs -  Physician Assistants and Nurse Practitioners) who all work together to provide you with the care you need, when you need it. ° °We recommend signing up for the patient portal called "MyChart".  Sign up information is provided on this After Visit Summary.  MyChart is used to connect with patients for Virtual Visits (Telemedicine).  Patients are able to view lab/test results, encounter notes, upcoming appointments, etc.  Non-urgent messages can be sent to your provider as well.   °To learn more about what you can do with MyChart, go to [email or web address].   ° °Your next appointment:   °1 year(s) ° °The format for your next appointment:   °In Person ° °Provider:   °Steven Klein, MD ° ° °Other Instructions ° ° °

## 2019-09-24 LAB — CUP PACEART INCLINIC DEVICE CHECK
Battery Remaining Longevity: 33 mo
Battery Voltage: 2.94 V
Brady Statistic AP VP Percent: 0 %
Brady Statistic AP VS Percent: 0 %
Brady Statistic AS VP Percent: 0 %
Brady Statistic AS VS Percent: 100 %
Brady Statistic RA Percent Paced: 0 %
Brady Statistic RV Percent Paced: 0.01 %
Date Time Interrogation Session: 20210903162900
HighPow Impedance: 89 Ohm
Implantable Lead Implant Date: 20151021
Implantable Lead Implant Date: 20151021
Implantable Lead Implant Date: 20151021
Implantable Lead Location: 753858
Implantable Lead Location: 753859
Implantable Lead Location: 753860
Implantable Lead Model: 4298
Implantable Lead Model: 5076
Implantable Pulse Generator Implant Date: 20151021
Lead Channel Impedance Value: 1026 Ohm
Lead Channel Impedance Value: 1140 Ohm
Lead Channel Impedance Value: 418 Ohm
Lead Channel Impedance Value: 475 Ohm
Lead Channel Impedance Value: 513 Ohm
Lead Channel Impedance Value: 532 Ohm
Lead Channel Impedance Value: 532 Ohm
Lead Channel Impedance Value: 646 Ohm
Lead Channel Impedance Value: 722 Ohm
Lead Channel Impedance Value: 760 Ohm
Lead Channel Impedance Value: 893 Ohm
Lead Channel Impedance Value: 988 Ohm
Lead Channel Impedance Value: 988 Ohm
Lead Channel Pacing Threshold Amplitude: 0.375 V
Lead Channel Pacing Threshold Amplitude: 0.75 V
Lead Channel Pacing Threshold Amplitude: 1.125 V
Lead Channel Pacing Threshold Pulse Width: 0.4 ms
Lead Channel Pacing Threshold Pulse Width: 0.4 ms
Lead Channel Pacing Threshold Pulse Width: 0.5 ms
Lead Channel Sensing Intrinsic Amplitude: 11.375 mV
Lead Channel Sensing Intrinsic Amplitude: 12.25 mV
Lead Channel Sensing Intrinsic Amplitude: 4.75 mV
Lead Channel Sensing Intrinsic Amplitude: 5.75 mV
Lead Channel Setting Pacing Amplitude: 1.5 V
Lead Channel Setting Pacing Amplitude: 1.5 V
Lead Channel Setting Pacing Amplitude: 2 V
Lead Channel Setting Pacing Pulse Width: 0.4 ms
Lead Channel Setting Pacing Pulse Width: 1 ms
Lead Channel Setting Sensing Sensitivity: 0.3 mV

## 2019-10-01 ENCOUNTER — Ambulatory Visit (HOSPITAL_COMMUNITY)
Admission: RE | Admit: 2019-10-01 | Discharge: 2019-10-01 | Disposition: A | Discharge status: Home / Self Care | Payer: Medicare HMO | Source: Ambulatory Visit | Attending: Cardiology | Admitting: Cardiology

## 2019-10-01 ENCOUNTER — Other Ambulatory Visit: Payer: Self-pay

## 2019-10-01 VITALS — BP 115/85 | HR 60 | Wt 188.6 lb

## 2019-10-01 DIAGNOSIS — Z79899 Other long term (current) drug therapy: Secondary | ICD-10-CM | POA: Insufficient documentation

## 2019-10-01 DIAGNOSIS — Z7901 Long term (current) use of anticoagulants: Secondary | ICD-10-CM | POA: Diagnosis not present

## 2019-10-01 DIAGNOSIS — Z7902 Long term (current) use of antithrombotics/antiplatelets: Secondary | ICD-10-CM | POA: Insufficient documentation

## 2019-10-01 DIAGNOSIS — Z8673 Personal history of transient ischemic attack (TIA), and cerebral infarction without residual deficits: Secondary | ICD-10-CM | POA: Diagnosis not present

## 2019-10-01 DIAGNOSIS — I5020 Unspecified systolic (congestive) heart failure: Secondary | ICD-10-CM | POA: Diagnosis not present

## 2019-10-01 DIAGNOSIS — I5022 Chronic systolic (congestive) heart failure: Secondary | ICD-10-CM

## 2019-10-01 DIAGNOSIS — Z8249 Family history of ischemic heart disease and other diseases of the circulatory system: Secondary | ICD-10-CM | POA: Insufficient documentation

## 2019-10-01 DIAGNOSIS — E785 Hyperlipidemia, unspecified: Secondary | ICD-10-CM | POA: Insufficient documentation

## 2019-10-01 DIAGNOSIS — I340 Nonrheumatic mitral (valve) insufficiency: Secondary | ICD-10-CM | POA: Diagnosis not present

## 2019-10-01 DIAGNOSIS — I428 Other cardiomyopathies: Secondary | ICD-10-CM

## 2019-10-01 LAB — BASIC METABOLIC PANEL
Anion gap: 8 (ref 5–15)
BUN: 10 mg/dL (ref 8–23)
CO2: 24 mmol/L (ref 22–32)
Calcium: 9.2 mg/dL (ref 8.9–10.3)
Chloride: 107 mmol/L (ref 98–111)
Creatinine, Ser: 0.98 mg/dL (ref 0.44–1.00)
GFR calc Af Amer: >60 mL/min (ref >60)
GFR calc non Af Amer: 58 mL/min — ABNORMAL LOW (ref >60)
Glucose, Bld: 93 mg/dL (ref 70–99)
Potassium: 3.9 mmol/L (ref 3.5–5.1)
Sodium: 139 mmol/L (ref 135–145)

## 2019-10-01 LAB — LIPID PANEL
Cholesterol: 151 mg/dL (ref 0–200)
HDL: 32 mg/dL — ABNORMAL LOW (ref >40)
LDL Cholesterol: 92 mg/dL (ref 0–99)
Total CHOL/HDL Ratio: 4.7 RATIO
Triglycerides: 133 mg/dL (ref <150)
VLDL: 27 mg/dL (ref 0–40)

## 2019-10-01 MED ORDER — FUROSEMIDE 40 MG PO TABS: 20.0000 mg | ORAL_TABLET | Freq: Every day | ORAL | 3 refills | Status: DC

## 2019-10-01 MED ORDER — ENTRESTO 49-51 MG PO TABS
1.0000 | ORAL_TABLET | Freq: Two times a day (BID) | ORAL | 3 refills | Status: DC
Start: 2019-10-01 — End: 2019-12-24

## 2019-10-01 MED ORDER — POTASSIUM CHLORIDE CRYS ER 20 MEQ PO TBCR: 10.0000 meq | EXTENDED_RELEASE_TABLET | Freq: Every day | ORAL | 3 refills | Status: DC

## 2019-10-01 NOTE — Patient Instructions (Signed)
Increase Entresto to 49/51 mg Twice daily   Decrease Furosemide to 20 mg Daily  Decrease Potassium to 10 meq Daily  Labs done today, your results will be available in MyChart, we will contact you for abnormal readings.  Your physician recommends that you return for lab work in: 10 days  Your physician recommends that you schedule a follow-up appointment in: 3 months  If you have any questions or concerns before your next appointment please send [place] a message through [place] or call our office at [phone].    TO LEAVE A MESSAGE FOR THE NURSE SELECT OPTION 2, PLEASE LEAVE A MESSAGE INCLUDING: . YOUR NAME . DATE OF BIRTH . CALL BACK NUMBER . REASON FOR CALL**this is important as we prioritize the call backs  YOU WILL RECEIVE A CALL BACK THE SAME DAY AS LONG AS YOU CALL BEFORE 4:00 PM  At the [facility], you and your health needs are our priority. As part of our continuing mission to provide you with exceptional heart care, we have created designated Provider Care Teams. These Care Teams include your primary Cardiologist (physician) and Advanced Practice Providers (APPs- Physician Assistants and Nurse Practitioners) who all work together to provide you with the care you need, when you need it.   You may see any of the following providers on your designated Care Team at your next follow up: [name] Dr Arvilla Meres . Dr [name] . Tonye Becket, NP . Robbie Lis, PA . Karle Plumber, PharmD   Please be sure to bring in all your medications bottles to every appointment.

## 2019-10-02 ENCOUNTER — Telehealth (HOSPITAL_COMMUNITY): Payer: Self-pay

## 2019-10-02 DIAGNOSIS — I6340 Cerebral infarction due to embolism of unspecified cerebral artery: Secondary | ICD-10-CM

## 2019-10-02 MED ORDER — ATORVASTATIN CALCIUM 80 MG PO TABS: 80.0000 mg | ORAL_TABLET | Freq: Every day | ORAL | 3 refills | Status: DC

## 2019-10-02 NOTE — Telephone Encounter (Signed)
-----   Message from [name], MD sent at 10/01/2019  9:27 PM EDT ----- Increase atorvastatin to 80 mg daily, do not have to add Zetia

## 2019-10-02 NOTE — Telephone Encounter (Signed)
Patient advised and verbalized understanding. New Rx sent in   Meds ordered this encounter  Medications   atorvastatin (LIPITOR) 80 MG tablet    Sig: Take 1 tablet (80 mg total) by mouth daily at 6 PM.    Dispense:  90 tablet    Refill:  3    Please cancel all previous orders for current medication. Change in dosage or pill size.

## 2019-10-03 NOTE — Progress Notes (Signed)
Patient ID: [name], female   DOB: 08-Apr-1950, 69 y.o.   MRN: [id number] PCP: Karl Ito, DO EP: Dr Graciela Husbands.  Cardiologist: Dr Shirlee Latch  Neurologist: Dr Pearlean Brownie  HPI: Ms. Pospisil is a 69 y.o. female with chronic systolic CHF with nonischemic cardiomypathy (no significant CAD) s/p Medtronic CRT-D, LBBB, mild-mod MR and CVA (09/2013).  Coronary angiography in 7/15 showed no significant CAD.  Admitted in 9/15 with right sided facial droop and expressive aphasia, CVA on MRI. Had TEE showing EF 15%.    QRS narrowed, so in 11/16 device was reprogrammed to VVI.   Echo in 12/17 showed EF up to 45-50%.  Echo in 3/19 showed EF 45-50% with moderate LVH and normal RV.   Echo was done in 4/21 showing EF decreased to 30-35% with LAD territory wall motion abnormalities.  LHC/RHC in 5/21 showed normal filling pressures and cardiac output, no significant CAD.  With persistent LBBB, CRT was activated by Dr. Graciela Husbands in 9/21.   She returns for followup of CHF.  She has lost 4 lbs.  No significant exertional dyspnea.  No orthopnea/PND. No lightheadedness or palpitations.   Medtronic device interrogation: >99% BiV pacing, stable thoracic impedance.   Labs (2/21): K 3.8, creatinine 0.96, LDL 105   PMH: 1. Nonischemic cardiomyopathy: Echo (7/15) with severe LV dilation, EF 25% with diffuse hypokinesis, mild to moderate MR.  RHC/ LHC 08/08/13 with RA 4, PA 23/8, PCWP 11, CI 2.52; no CAD.  TEE (9/15) with EF 15-20%, severe global hypokinesis, no LV thrombus, moderate MR, normal RV.  Echo (10/2013): EF 25-30%, grade I DD, mild AI. Medtronic CRT-D (10/15).  Echo (2/16) with EF 25-30%, diffuse hypokinesis worse in the septum, grade I diastolic dysfunction, normal RV size and systolic function, mild-moderate MR.  - Echo (8/16) with EF 35-40%, diffuse hypokinesis, mild-moderate mitral regurgitation.  - Echo (12/17): EF 45-50%, mild LVH, mild to moderate MR.  - Echo (3/19): EF 45-50% with moderate LVH, mild MR, normal RV  size and systolic function.  - Echo (4/21): EF 30-35% with LAD territory wall motion abnormalities. - LHC/RHC (5/21): No significant CAD; mean RA 6, PA 31/13, mean PCWP 13, CI 3.37.  2. CVA: 9/15 with dysarthria.  Carotids 9/15 with no significant CAD.  - Carotid dopplers (10/17): mild plaque, normal subclavians.  3. LBBB: Medtronic CRT-D (10/2013) 4. Hyperlipidemia 5. Cholelithiasis 6. Peripheral arterial dopplers (6/19): Normal  SH: lives with her son and husband. Does not drink or smoke. Currently not working.   FH: Mom had A fib and Heart Failure. Deceased 35        Sister has lupus  ROS: All systems negative except as listed in HPI, PMH and Problem List.   Current Outpatient Medications  Medication Sig Dispense Refill  . albuterol (VENTOLIN HFA) 108 (90 Base) MCG/ACT inhaler Inhale 2 puffs into the lungs at bedtime as needed for wheezing or shortness of breath. 18 g 3  . ALPRAZolam (XANAX) 0.5 MG tablet Take 0.5 mg by mouth at bedtime as needed for anxiety or sleep.     . carvedilol (COREG) 6.25 MG tablet Take 1 tablet (6.25 mg total) by mouth 2 (two) times daily with a meal. 180 tablet 3  . cetirizine (ZYRTEC ALLERGY) 10 MG tablet Take 10 mg by mouth daily.    . clopidogrel (PLAVIX) 75 MG tablet Take 1 tablet (75 mg total) by mouth daily. 90 tablet 3  . fluticasone (FLONASE) 50 MCG/ACT nasal spray Place 2 sprays into both nostrils  daily. 16 g 0  . furosemide (LASIX) 40 MG tablet Take 0.5 tablets (20 mg total) by mouth daily. 90 tablet 3  . pantoprazole (PROTONIX) 40 MG tablet Take 40 mg by mouth daily.     . potassium chloride SA (KLOR-CON) 20 MEQ tablet Take 0.5 tablets (10 mEq total) by mouth daily. 90 tablet 3  . spironolactone (ALDACTONE) 25 MG tablet Take 1 tablet (25 mg total) by mouth at bedtime. 90 tablet 3  . traMADol (ULTRAM) 50 MG tablet Take 50 mg by mouth every 6 (six) hours as needed for severe pain (arthiritis in knees and ankle).     [name] atorvastatin (LIPITOR) 80  MG tablet Take 1 tablet (80 mg total) by mouth daily at 6 PM. 90 tablet 3  . sacubitril-valsartan (ENTRESTO) 49-51 MG Take 1 tablet by mouth 2 (two) times daily. 60 tablet 3   No current facility-administered medications for this encounter.    Vitals:   10/01/19 1421  BP: 115/85  Pulse: 60  SpO2: 99%  Weight: 85.5 kg (188 lb 9.6 oz)    PHYSICAL EXAM: General: NAD Neck: No JVD, no thyromegaly or thyroid nodule.  Lungs: Clear to auscultation bilaterally with normal respiratory effort. CV: Nondisplaced PMI.  Heart regular S1/S2, no S3/S4, no murmur.  No peripheral edema.  No carotid bruit.  Normal pedal pulses.  Abdomen: Soft, nontender, no hepatosplenomegaly, no distention.  Skin: Intact without lesions or rashes.  Neurologic: Alert and oriented x 3.  Psych: Normal affect. Extremities: No clubbing or cyanosis.  HEENT: Normal.   ASSESSMENT & PLAN:  1. Chronic Systolic Heart Failure: Nonischemic cardiomyopathy s/p Medtronic CRT-D in 10/15, coronary angiography without CAD in 07/2013. Echo done in 2/16, EF 25-30%.  Repeat echo in 8/16 with EF up to 35-40%.  Echo in 4/19 showed EF up to 45-50%.  However, echo in 4/21 showed EF back down to 30-35%.  LHC/RHC in 5/21 showed no CAD, normal filling pressures, preserved cardiac output.  Of note, Medtronic device had been reprogrammed to VVI as LBBB has been only intermittent => more recently, LBBB has been persistent and CRT was reactivated in 9/21. NYHA class II. She is not volume overloaded by exam or Optivol.    - Continue Coreg 6.25 mg bid, she did not tolerate increase to 9.375 mg bid.   - Increase Entresto to 49/51 bid.  BMET today and in 10 days.    - Decrease Lasix to 20 mg daily and KCl to 10 mEq daily.  - Continue spironolactone 25 mg daily.   2. CVA: ?Embolic.  No atrial fibrillation has been detected so far on device.  She will continue Plavix for now. If future device interrogation shows atrial fibrillation, she will need to be  anticoagulated.   3. Hyperlipidemia: Goal LDL < 70 with history of CVA. - Continue statin, check lipids today.   Followup in 3 months with NP/PA.    [name] 10/03/2019

## 2019-10-06 ENCOUNTER — Ambulatory Visit (INDEPENDENT_AMBULATORY_CARE_PROVIDER_SITE_OTHER): Payer: Medicare HMO | Admitting: *Deleted

## 2019-10-06 DIAGNOSIS — I428 Other cardiomyopathies: Secondary | ICD-10-CM

## 2019-10-07 LAB — CUP PACEART REMOTE DEVICE CHECK
Battery Remaining Longevity: 29 mo
Battery Voltage: 2.93 V
Brady Statistic AP VP Percent: 0 %
Brady Statistic AP VS Percent: 0 %
Brady Statistic AS VP Percent: 97.9 %
Brady Statistic AS VS Percent: 2.09 %
Brady Statistic RA Percent Paced: 0.01 %
Brady Statistic RV Percent Paced: 95.72 %
Date Time Interrogation Session: 20210920153630
HighPow Impedance: 86 Ohm
Implantable Lead Implant Date: 20151021
Implantable Lead Implant Date: 20151021
Implantable Lead Implant Date: 20151021
Implantable Lead Location: 753858
Implantable Lead Location: 753859
Implantable Lead Location: 753860
Implantable Lead Model: 4298
Implantable Lead Model: 5076
Implantable Pulse Generator Implant Date: 20151021
Lead Channel Impedance Value: 1007 Ohm
Lead Channel Impedance Value: 1064 Ohm
Lead Channel Impedance Value: 418 Ohm
Lead Channel Impedance Value: 475 Ohm
Lead Channel Impedance Value: 513 Ohm
Lead Channel Impedance Value: 513 Ohm
Lead Channel Impedance Value: 513 Ohm
Lead Channel Impedance Value: 608 Ohm
Lead Channel Impedance Value: 665 Ohm
Lead Channel Impedance Value: 703 Ohm
Lead Channel Impedance Value: 874 Ohm
Lead Channel Impedance Value: 950 Ohm
Lead Channel Impedance Value: 988 Ohm
Lead Channel Pacing Threshold Amplitude: 0.5 V
Lead Channel Pacing Threshold Amplitude: 0.625 V
Lead Channel Pacing Threshold Amplitude: 1.125 V
Lead Channel Pacing Threshold Pulse Width: 0.4 ms
Lead Channel Pacing Threshold Pulse Width: 0.4 ms
Lead Channel Pacing Threshold Pulse Width: 0.5 ms
Lead Channel Sensing Intrinsic Amplitude: 4.25 mV
Lead Channel Sensing Intrinsic Amplitude: 4.25 mV
Lead Channel Sensing Intrinsic Amplitude: 9.875 mV
Lead Channel Sensing Intrinsic Amplitude: 9.875 mV
Lead Channel Setting Pacing Amplitude: 1.5 V
Lead Channel Setting Pacing Amplitude: 1.5 V
Lead Channel Setting Pacing Amplitude: 2 V
Lead Channel Setting Pacing Pulse Width: 0.4 ms
Lead Channel Setting Pacing Pulse Width: 1 ms
Lead Channel Setting Sensing Sensitivity: 0.3 mV

## 2019-10-08 NOTE — Progress Notes (Signed)
Remote ICD transmission.   

## 2019-10-10 ENCOUNTER — Other Ambulatory Visit: Payer: Self-pay | Admitting: General Practice

## 2019-10-10 DIAGNOSIS — Z1231 Encounter for screening mammogram for malignant neoplasm of breast: Secondary | ICD-10-CM

## 2019-10-15 ENCOUNTER — Other Ambulatory Visit: Payer: Self-pay

## 2019-10-15 ENCOUNTER — Ambulatory Visit (HOSPITAL_COMMUNITY)
Admission: RE | Admit: 2019-10-15 | Discharge: 2019-10-15 | Disposition: A | Discharge status: Home / Self Care | Payer: Medicare HMO | Source: Ambulatory Visit | Attending: Cardiology | Admitting: Cardiology

## 2019-10-15 DIAGNOSIS — I428 Other cardiomyopathies: Secondary | ICD-10-CM | POA: Insufficient documentation

## 2019-10-15 LAB — BASIC METABOLIC PANEL
Anion gap: 13 (ref 5–15)
BUN: 8 mg/dL (ref 8–23)
CO2: 23 mmol/L (ref 22–32)
Calcium: 8.8 mg/dL — ABNORMAL LOW (ref 8.9–10.3)
Chloride: 106 mmol/L (ref 98–111)
Creatinine, Ser: 0.88 mg/dL (ref 0.44–1.00)
GFR calc Af Amer: >60 mL/min (ref >60)
GFR calc non Af Amer: >60 mL/min (ref >60)
Glucose, Bld: 106 mg/dL — ABNORMAL HIGH (ref 70–99)
Potassium: 3.4 mmol/L — ABNORMAL LOW (ref 3.5–5.1)
Sodium: 142 mmol/L (ref 135–145)

## 2019-10-15 NOTE — Addendum Note (Signed)
Encounter addended by: Modesta Messing, CMA on: 10/15/2019 10:23 AM  Actions taken: Charge Capture section accepted

## 2019-10-16 ENCOUNTER — Other Ambulatory Visit: Payer: Self-pay | Admitting: General Practice

## 2019-10-16 DIAGNOSIS — Z7722 Contact with and (suspected) exposure to environmental tobacco smoke (acute) (chronic): Secondary | ICD-10-CM

## 2019-10-20 ENCOUNTER — Encounter (HOSPITAL_COMMUNITY): Payer: Self-pay

## 2019-10-20 ENCOUNTER — Telehealth (HOSPITAL_COMMUNITY): Payer: Self-pay

## 2019-10-20 NOTE — Telephone Encounter (Signed)
-----   Message from [name], MD sent at 10/15/2019  3:50 PM EDT ----- Increase dietary k

## 2019-10-20 NOTE — Telephone Encounter (Signed)
Samara Snide, RN  10/20/2019 4:48 PM EDT Back to Top    [facility]. Letter mailed to address on file   Samara Snide, RN  10/17/2019 4:34 PM EDT     [facility]   Philicia R Branch, CMA  10/16/2019 2:09 PM EDT     [facility]   Samara Snide, RN  10/15/2019 5:02 PM EDT     Left message to return call

## 2019-10-28 ENCOUNTER — Ambulatory Visit: Payer: Medicare HMO

## 2019-11-07 ENCOUNTER — Other Ambulatory Visit: Payer: Medicare HMO

## 2019-11-19 ENCOUNTER — Other Ambulatory Visit: Payer: Medicare HMO

## 2019-12-04 ENCOUNTER — Other Ambulatory Visit: Payer: Medicare HMO

## 2019-12-24 ENCOUNTER — Other Ambulatory Visit (HOSPITAL_COMMUNITY): Payer: Self-pay

## 2019-12-24 MED ORDER — ENTRESTO 49-51 MG PO TABS: 1.0000 | ORAL_TABLET | Freq: Two times a day (BID) | ORAL | 3 refills | Status: DC

## 2019-12-26 ENCOUNTER — Telehealth: Payer: Self-pay

## 2019-12-26 NOTE — Telephone Encounter (Signed)
The pt states her lead was jumping all night and she could not sleep. I offered her a 2:30 pm appointment for today but she declined. I explained to her we will have to put her on the schedule she can not just come in. I tried to call her back to offer a different appointment. She did not answer. I left my direct office number for her to call back.

## 2019-12-30 ENCOUNTER — Encounter (HOSPITAL_COMMUNITY): Payer: Medicare HMO

## 2019-12-30 NOTE — Progress Notes (Signed)
Patient ID: [name], female   DOB: Apr 09, 1950, 69 y.o.   MRN: [id number] PCP: Danielle Ito, DO EP: Dr Graciela Husbands.  Cardiologist: Dr Shirlee Latch  Neurologist: Dr Pearlean Brownie  HPI: [name] is a 69 y.o. female with chronic systolic CHF with nonischemic cardiomypathy (no significant CAD) s/p Medtronic CRT-D, LBBB, mild-mod MR and CVA (09/2013).  Coronary angiography in 7/15 showed no significant CAD.  Admitted in 9/15 with right sided facial droop and expressive aphasia, CVA on MRI. Had TEE showing EF 15%.    QRS narrowed, so in 11/16 device was reprogrammed to VVI.   Echo in 12/17 showed EF up to 45-50%.  Echo in 3/19 showed EF 45-50% with moderate LVH and normal RV.   Echo was done in 4/21 showing EF decreased to 30-35% with LAD territory wall motion abnormalities.  LHC/RHC in 5/21 showed normal filling pressures and cardiac output, no significant CAD.  With persistent LBBB, CRT was activated by Dr. Graciela Husbands in 9/21.   Today she returns for HF follow up.Overall feeling fine. Denies SOB/PND/Orthopnea. No chest pain. Ambulates with cane.  Appetite ok. No fever or chills. Weight at home trending down from 190>183 pounds. Taking all medications. Lives with her husband and son.   Medtronic device interrogation: Fluid index well above threshold since the end of September. Activity ~ 2 Hours. No VT/Afib.   Labs (2/21): K 3.8, creatinine 0.96, LDL 105  Labs 9/29/'2021: K 3.4 Creatinine 0.88  PMH: 1. Nonischemic cardiomyopathy: Echo (7/15) with severe LV dilation, EF 25% with diffuse hypokinesis, mild to moderate MR.  RHC/ LHC 08/08/13 with RA 4, PA 23/8, PCWP 11, CI 2.52; no CAD.  TEE (9/15) with EF 15-20%, severe global hypokinesis, no LV thrombus, moderate MR, normal RV.  Echo (10/2013): EF 25-30%, grade I DD, mild AI. Medtronic CRT-D (10/15).  Echo (2/16) with EF 25-30%, diffuse hypokinesis worse in the septum, grade I diastolic dysfunction, normal RV size and systolic function, mild-moderate MR.  - Echo  (8/16) with EF 35-40%, diffuse hypokinesis, mild-moderate mitral regurgitation.  - Echo (12/17): EF 45-50%, mild LVH, mild to moderate MR.  - Echo (3/19): EF 45-50% with moderate LVH, mild MR, normal RV size and systolic function.  - Echo (4/21): EF 30-35% with LAD territory wall motion abnormalities. - LHC/RHC (5/21): No significant CAD; mean RA 6, PA 31/13, mean PCWP 13, CI 3.37.  2. CVA: 9/15 with dysarthria.  Carotids 9/15 with no significant CAD.  - Carotid dopplers (10/17): mild plaque, normal subclavians.  3. LBBB: Medtronic CRT-D (10/2013) 4. Hyperlipidemia 5. Cholelithiasis 6. Peripheral arterial dopplers (6/19): Normal  SH: lives with her son and husband. Does not drink or smoke. Currently not working.   FH: Mom had A fib and Heart Failure. Deceased 9        Sister has lupus  ROS: All systems negative except as listed in HPI, PMH and Problem List.   Current Outpatient Medications  Medication Sig Dispense Refill  . albuterol (VENTOLIN HFA) 108 (90 Base) MCG/ACT inhaler Inhale 2 puffs into the lungs at bedtime as needed for wheezing or shortness of breath. 18 g 3  . ALPRAZolam (XANAX) 0.5 MG tablet Take 0.5 mg by mouth at bedtime as needed for anxiety or sleep.     [name] atorvastatin (LIPITOR) 80 MG tablet Take 1 tablet (80 mg total) by mouth daily at 6 PM. 90 tablet 3  . carvedilol (COREG) 6.25 MG tablet Take 1 tablet (6.25 mg total) by mouth 2 (two) times daily  with a meal. 180 tablet 3  . cetirizine (ZYRTEC) 10 MG tablet Take 10 mg by mouth daily.    . fluticasone (FLONASE) 50 MCG/ACT nasal spray Place 2 sprays into both nostrils daily. 16 g 0  . furosemide (LASIX) 40 MG tablet Take 0.5 tablets (20 mg total) by mouth daily. 90 tablet 3  . pantoprazole (PROTONIX) 40 MG tablet Take 40 mg by mouth daily.     . potassium chloride SA (KLOR-CON) 20 MEQ tablet Take 0.5 tablets (10 mEq total) by mouth daily. 90 tablet 3  . sacubitril-valsartan (ENTRESTO) 49-51 MG Take 1 tablet by  mouth 2 (two) times daily. 60 tablet 3  . spironolactone (ALDACTONE) 25 MG tablet Take 1 tablet (25 mg total) by mouth at bedtime. 90 tablet 3  . traMADol (ULTRAM) 50 MG tablet Take 50 mg by mouth every 6 (six) hours as needed for severe pain (arthiritis in knees and ankle).     [name] clopidogrel (PLAVIX) 75 MG tablet Take 1 tablet (75 mg total) by mouth daily. (Patient not taking: Reported on 01/01/2020) 90 tablet 3   No current facility-administered medications for this encounter.    Vitals:   01/01/20 0852  BP: 112/78  Pulse: 69  SpO2: 99%  Weight: 84.9 kg (187 lb 3.2 oz)    ReDS Vest / Clip - 01/01/20 0900      ReDS Vest / Clip   Station Marker B    Ruler Value 32    ReDS Value Range Low volume    ReDS Actual Value 28    Anatomical Comments sitting           PHYSICAL EXAM: General: Ambulated in the clinic with a cane. No resp difficulty HEENT: normal Neck: supple. no JVD. Carotids 2+ bilat; no bruits. No lymphadenopathy or thryomegaly appreciated. Cor: PMI nondisplaced. Regular rate & rhythm. No rubs, gallops or murmurs. Lungs: clear Abdomen: soft, nontender, nondistended. No hepatosplenomegaly. No bruits or masses. Good bowel sounds. Extremities: no cyanosis, clubbing, rash, edema Neuro: alert & orientedx3, cranial nerves grossly intact. moves all 4 extremities w/o difficulty. Affect pleasant  ASSESSMENT & PLAN:  1. Chronic Systolic Heart Failure: Nonischemic cardiomyopathy s/p Medtronic CRT-D in 10/15, coronary angiography without CAD in 07/2013. Echo done in 2/16, EF 25-30%.  Repeat echo in 8/16 with EF up to 35-40%.  Echo in 4/19 showed EF up to 45-50%.  However, echo in 4/21 showed EF back down to 30-35%.  LHC/RHC in 5/21 showed no CAD, normal filling pressures, preserved cardiac output.  Of note, Medtronic device had been reprogrammed to VVI as LBBB has been only intermittent => more recently, LBBB has been persistent and CRT was reactivated in 9/21.   Optivol- fluid  index above threshold since the end of September. No VT. No A fib. Activity ~2 hours.    -NYHA II. Volume status ok. Stop lasix with addition of farxiga.  - Continue Coreg 6.25 mg bid, she did not tolerate increase to 9.375 mg bid.   - Continue Entresto to 49/51 bid.  - - Continue spironolactone 25 mg daily.   - Add SGT2i today. Start farxiga 10 mg daily. Check BMET today and in 7 days.  2. CVA: ?Embolic.  No atrial fibrillation has been detected so far on device.  She will continue Plavix for now. If future device interrogation shows atrial fibrillation, she will need to be anticoagulated.   - No bleeding issues.  3. Hyperlipidemia: Goal LDL < 70 with history of CVA. -  Continue statin.    Referred to the Device Clinic. Discussed optivol results.  Discussed purpose of farxiga. Check BMET today and in 7da.s     Tonye Becket NP-C  01/01/2020

## 2020-01-01 ENCOUNTER — Encounter (HOSPITAL_COMMUNITY): Payer: Self-pay

## 2020-01-01 ENCOUNTER — Ambulatory Visit (HOSPITAL_COMMUNITY)
Admission: RE | Admit: 2020-01-01 | Discharge: 2020-01-01 | Disposition: A | Discharge status: Home / Self Care | Payer: Medicare HMO | Source: Ambulatory Visit | Attending: Adult Health | Admitting: Adult Health

## 2020-01-01 ENCOUNTER — Other Ambulatory Visit: Payer: Self-pay

## 2020-01-01 VITALS — BP 112/78 | HR 69 | Wt 187.2 lb

## 2020-01-01 DIAGNOSIS — Z79899 Other long term (current) drug therapy: Secondary | ICD-10-CM | POA: Insufficient documentation

## 2020-01-01 DIAGNOSIS — E785 Hyperlipidemia, unspecified: Secondary | ICD-10-CM | POA: Insufficient documentation

## 2020-01-01 DIAGNOSIS — Z8673 Personal history of transient ischemic attack (TIA), and cerebral infarction without residual deficits: Secondary | ICD-10-CM | POA: Insufficient documentation

## 2020-01-01 DIAGNOSIS — Z7902 Long term (current) use of antithrombotics/antiplatelets: Secondary | ICD-10-CM | POA: Diagnosis not present

## 2020-01-01 DIAGNOSIS — I5022 Chronic systolic (congestive) heart failure: Secondary | ICD-10-CM | POA: Diagnosis not present

## 2020-01-01 DIAGNOSIS — Z8249 Family history of ischemic heart disease and other diseases of the circulatory system: Secondary | ICD-10-CM | POA: Insufficient documentation

## 2020-01-01 DIAGNOSIS — I428 Other cardiomyopathies: Secondary | ICD-10-CM | POA: Diagnosis not present

## 2020-01-01 DIAGNOSIS — I5020 Unspecified systolic (congestive) heart failure: Secondary | ICD-10-CM | POA: Diagnosis not present

## 2020-01-01 DIAGNOSIS — Z7901 Long term (current) use of anticoagulants: Secondary | ICD-10-CM | POA: Diagnosis not present

## 2020-01-01 DIAGNOSIS — I6340 Cerebral infarction due to embolism of unspecified cerebral artery: Secondary | ICD-10-CM

## 2020-01-01 LAB — BASIC METABOLIC PANEL
Anion gap: 10 (ref 5–15)
BUN: 12 mg/dL (ref 8–23)
CO2: 23 mmol/L (ref 22–32)
Calcium: 9 mg/dL (ref 8.9–10.3)
Chloride: 104 mmol/L (ref 98–111)
Creatinine, Ser: 0.87 mg/dL (ref 0.44–1.00)
GFR, Estimated: >60 mL/min (ref >60)
Glucose, Bld: 103 mg/dL — ABNORMAL HIGH (ref 70–99)
Potassium: 3.7 mmol/L (ref 3.5–5.1)
Sodium: 137 mmol/L (ref 135–145)

## 2020-01-01 MED ORDER — DAPAGLIFLOZIN PROPANEDIOL 10 MG PO TABS
10.0000 mg | ORAL_TABLET | Freq: Every day | ORAL | 3 refills | Status: DC
Start: 2020-01-01 — End: 2021-09-01

## 2020-01-01 MED ORDER — ENTRESTO 49-51 MG PO TABS: 1.0000 | ORAL_TABLET | Freq: Two times a day (BID) | ORAL | 3 refills | Status: DC

## 2020-01-01 NOTE — Progress Notes (Signed)
ReDS Vest / Clip - 01/01/20 0900      ReDS Vest / Clip   Station Marker B    Ruler Value 32    ReDS Value Range Low volume    ReDS Actual Value 28    Anatomical Comments sitting

## 2020-01-01 NOTE — Patient Instructions (Addendum)
START Farxiga 10mg  (1 tablet) Daily  STOP Lasix  Labs done today, your results will be available in MyChart, we will contact you for abnormal readings.  Your physician recommends that you schedule repeat labs in 1 week  Your physician recommends that you schedule a follow-up appointment in: 3 months with Dr.  If you have any questions or concerns before your next appointment please send Shirlee Latch a message through [facility] or call our office at [phone].    TO LEAVE A MESSAGE FOR THE NURSE SELECT OPTION 2, PLEASE LEAVE A MESSAGE INCLUDING: . YOUR NAME . DATE OF BIRTH . CALL BACK NUMBER . REASON FOR CALL**this is important as we prioritize the call backs  YOU WILL RECEIVE A CALL BACK THE SAME DAY AS LONG AS YOU CALL BEFORE 4:00 PM

## 2020-01-01 NOTE — Addendum Note (Signed)
Encounter addended by: Samara Snide, RN on: 01/01/2020 9:33 AM  Actions taken: Order list changed

## 2020-01-06 ENCOUNTER — Ambulatory Visit (INDEPENDENT_AMBULATORY_CARE_PROVIDER_SITE_OTHER): Payer: Medicare HMO

## 2020-01-06 ENCOUNTER — Telehealth: Payer: Self-pay

## 2020-01-06 DIAGNOSIS — I428 Other cardiomyopathies: Secondary | ICD-10-CM

## 2020-01-06 LAB — CUP PACEART REMOTE DEVICE CHECK
Battery Remaining Longevity: 22 mo
Battery Voltage: 2.93 V
Brady Statistic AP VP Percent: 0.02 %
Brady Statistic AP VS Percent: 0 %
Brady Statistic AS VP Percent: 98.26 %
Brady Statistic AS VS Percent: 1.71 %
Brady Statistic RA Percent Paced: 0.03 %
Brady Statistic RV Percent Paced: 96.7 %
Date Time Interrogation Session: 20211221022828
HighPow Impedance: 86 Ohm
Implantable Lead Implant Date: 20151021
Implantable Lead Implant Date: 20151021
Implantable Lead Implant Date: 20151021
Implantable Lead Location: 753858
Implantable Lead Location: 753859
Implantable Lead Location: 753860
Implantable Lead Model: 4298
Implantable Lead Model: 5076
Implantable Pulse Generator Implant Date: 20151021
Lead Channel Impedance Value: 1007 Ohm
Lead Channel Impedance Value: 1026 Ohm
Lead Channel Impedance Value: 456 Ohm
Lead Channel Impedance Value: 475 Ohm
Lead Channel Impedance Value: 513 Ohm
Lead Channel Impedance Value: 532 Ohm
Lead Channel Impedance Value: 551 Ohm
Lead Channel Impedance Value: 589 Ohm
Lead Channel Impedance Value: 646 Ohm
Lead Channel Impedance Value: 665 Ohm
Lead Channel Impedance Value: 950 Ohm
Lead Channel Impedance Value: 950 Ohm
Lead Channel Impedance Value: 988 Ohm
Lead Channel Pacing Threshold Amplitude: 0.375 V
Lead Channel Pacing Threshold Amplitude: 0.625 V
Lead Channel Pacing Threshold Amplitude: 1.125 V
Lead Channel Pacing Threshold Pulse Width: 0.4 ms
Lead Channel Pacing Threshold Pulse Width: 0.4 ms
Lead Channel Pacing Threshold Pulse Width: 0.5 ms
Lead Channel Sensing Intrinsic Amplitude: 10.25 mV
Lead Channel Sensing Intrinsic Amplitude: 10.25 mV
Lead Channel Sensing Intrinsic Amplitude: 4.5 mV
Lead Channel Sensing Intrinsic Amplitude: 4.5 mV
Lead Channel Setting Pacing Amplitude: 1.5 V
Lead Channel Setting Pacing Amplitude: 1.5 V
Lead Channel Setting Pacing Amplitude: 2 V
Lead Channel Setting Pacing Pulse Width: 0.4 ms
Lead Channel Setting Pacing Pulse Width: 1 ms
Lead Channel Setting Sensing Sensitivity: 0.3 mV

## 2020-01-06 NOTE — Telephone Encounter (Signed)
-----   Message from Sherald Hess, NP sent at 01/01/2020  9:13 AM EST ----- Regarding: plz follow Please follow in device clinic thks Amy

## 2020-01-06 NOTE — Telephone Encounter (Signed)
Referred to ICM clinic by Tonye Becket, NP at 01/01/2020 OV.  Attempted call to patient for ICM intro and left message for return call.

## 2020-01-08 ENCOUNTER — Other Ambulatory Visit (HOSPITAL_COMMUNITY): Payer: Medicare HMO

## 2020-01-21 NOTE — Progress Notes (Signed)
Remote ICD transmission.   

## 2020-02-02 NOTE — Telephone Encounter (Signed)
Spoke with patient and provided ICM intro.  She agreed to monthly ICM follow up as recommended by Tonye Becket NP HF clinic. Patient said she is feeling okay.  Advised will scheduled remote transmission from home monitor on 03/01/2020.  Provided ICM direct number and encouraged to call if she develops fluid symptoms. ICM remote transmission scheduled for 03/01/2020.

## 2020-02-14 ENCOUNTER — Other Ambulatory Visit: Payer: Self-pay | Admitting: Internal Medicine

## 2020-02-26 ENCOUNTER — Other Ambulatory Visit (HOSPITAL_COMMUNITY): Payer: Self-pay | Admitting: *Deleted

## 2020-02-26 MED ORDER — SPIRONOLACTONE 25 MG PO TABS: 25.0000 mg | ORAL_TABLET | Freq: Every day | ORAL | 3 refills | Status: DC

## 2020-03-01 ENCOUNTER — Ambulatory Visit (INDEPENDENT_AMBULATORY_CARE_PROVIDER_SITE_OTHER): Payer: Medicare HMO

## 2020-03-01 DIAGNOSIS — I5022 Chronic systolic (congestive) heart failure: Secondary | ICD-10-CM

## 2020-03-01 DIAGNOSIS — Z95810 Presence of automatic (implantable) cardiac defibrillator: Secondary | ICD-10-CM | POA: Diagnosis not present

## 2020-03-05 ENCOUNTER — Telehealth: Payer: Self-pay

## 2020-03-05 NOTE — Telephone Encounter (Signed)
Remote ICM transmission received.  Attempted call to patient regarding ICM remote transmission and no answer or voice mail box is full.

## 2020-03-05 NOTE — Progress Notes (Signed)
EPIC Encounter for ICM Monitoring  Patient Name: TYJAE SHVARTSMAN is a 70 y.o. female Date: 03/05/2020 Primary Care Physican: Karl Ito, DO Primary Cardiologist: Shirlee Latch Electrophysiologist: Joycelyn Schmid Pacing: 97.2%  Weight:  unknown       1st ICM Remote Transmission follow up.  Attempted call to patient and unable to reach. Transmission reviewed.    Optivol thoracic impedance normal.   Prescribed:  Potassium 20 mEq take 0.5 tablet daily Spironolactone 25 mg take 1 tablet at bedtime.  Recommendations:   Left voice mail with ICM number and encouraged to call if experiencing any fluid symptoms.  Follow-up plan: ICM clinic phone appointment on 04/07/2020.   91 day device clinic remote transmission 04/06/2020.    EP/Cardiology Office Visits: 04/08/2020 with Dr. Shirlee Latch.    Copy of ICM check sent to Dr. Graciela Husbands.   3 month ICM trend: 03/01/2020.    1 Year ICM trend:       Karie Soda, RN 03/05/2020 4:15 PM

## 2020-04-06 ENCOUNTER — Ambulatory Visit (INDEPENDENT_AMBULATORY_CARE_PROVIDER_SITE_OTHER): Payer: Medicare HMO

## 2020-04-06 DIAGNOSIS — I428 Other cardiomyopathies: Secondary | ICD-10-CM | POA: Diagnosis not present

## 2020-04-07 ENCOUNTER — Ambulatory Visit (INDEPENDENT_AMBULATORY_CARE_PROVIDER_SITE_OTHER): Payer: Medicare HMO

## 2020-04-07 ENCOUNTER — Telehealth: Payer: Self-pay

## 2020-04-07 DIAGNOSIS — Z95810 Presence of automatic (implantable) cardiac defibrillator: Secondary | ICD-10-CM

## 2020-04-07 DIAGNOSIS — I5022 Chronic systolic (congestive) heart failure: Secondary | ICD-10-CM | POA: Diagnosis not present

## 2020-04-07 LAB — CUP PACEART REMOTE DEVICE CHECK
Battery Remaining Longevity: 21 mo
Battery Voltage: 2.92 V
Brady Statistic AP VP Percent: 0.02 %
Brady Statistic AP VS Percent: 0.01 %
Brady Statistic AS VP Percent: 98.08 %
Brady Statistic AS VS Percent: 1.9 %
Brady Statistic RA Percent Paced: 0.03 %
Brady Statistic RV Percent Paced: 96.12 %
Date Time Interrogation Session: 20220322213432
HighPow Impedance: 86 Ohm
Implantable Lead Implant Date: 20151021
Implantable Lead Implant Date: 20151021
Implantable Lead Implant Date: 20151021
Implantable Lead Location: 753858
Implantable Lead Location: 753859
Implantable Lead Location: 753860
Implantable Lead Model: 4298
Implantable Lead Model: 5076
Implantable Pulse Generator Implant Date: 20151021
Lead Channel Impedance Value: 1026 Ohm
Lead Channel Impedance Value: 1026 Ohm
Lead Channel Impedance Value: 1026 Ohm
Lead Channel Impedance Value: 1083 Ohm
Lead Channel Impedance Value: 475 Ohm
Lead Channel Impedance Value: 532 Ohm
Lead Channel Impedance Value: 532 Ohm
Lead Channel Impedance Value: 551 Ohm
Lead Channel Impedance Value: 551 Ohm
Lead Channel Impedance Value: 608 Ohm
Lead Channel Impedance Value: 646 Ohm
Lead Channel Impedance Value: 665 Ohm
Lead Channel Impedance Value: 950 Ohm
Lead Channel Pacing Threshold Amplitude: 0.375 V
Lead Channel Pacing Threshold Amplitude: 0.625 V
Lead Channel Pacing Threshold Amplitude: 1.125 V
Lead Channel Pacing Threshold Pulse Width: 0.4 ms
Lead Channel Pacing Threshold Pulse Width: 0.4 ms
Lead Channel Pacing Threshold Pulse Width: 0.5 ms
Lead Channel Sensing Intrinsic Amplitude: 10.25 mV
Lead Channel Sensing Intrinsic Amplitude: 10.25 mV
Lead Channel Sensing Intrinsic Amplitude: 4.875 mV
Lead Channel Sensing Intrinsic Amplitude: 4.875 mV
Lead Channel Setting Pacing Amplitude: 1.5 V
Lead Channel Setting Pacing Amplitude: 1.5 V
Lead Channel Setting Pacing Amplitude: 2 V
Lead Channel Setting Pacing Pulse Width: 0.4 ms
Lead Channel Setting Pacing Pulse Width: 1 ms
Lead Channel Setting Sensing Sensitivity: 0.3 mV

## 2020-04-07 NOTE — Progress Notes (Addendum)
EPIC Encounter for ICM Monitoring  Patient Name: [name] is a 70 y.o. female Date: 04/07/2020 Primary Care Physican: Karl Ito, DO Primary Cardiologist: Shirlee Latch Electrophysiologist: Joycelyn Schmid Pacing: 97.2%         04/07/2020 Weight:  177 lbs                                                           Spoke with patient and reports feeling well at this time.  Denies fluid symptoms.  She reports she does not follow low salt diet.  She said she did have several pickles and drank pickle juice earlier this week.    Optivol thoracic impedance suggesting possible fluid accumulation since 03/22/2020.   Prescribed:  Per 12/31/2020 HF clinic office note, Furosemide was discontinued.   Patient states she is taking Furosemide 40 mg every evening but medication is not listed in Epic.    Potassium 20 mEq take 0.5 tablet daily Spironolactone 25 mg take 1 tablet at bedtime.  Recommendations:   Recommendation to limit salt intake to 2000 mg daily and fluid intake to 64 oz daily.  Copy sent to Dr Shirlee Latch for review and recommendations if needed.   Follow-up plan: ICM clinic phone appointment on 04/19/2020 to recheck fluid levels.   91 day device clinic remote transmission 07/06/2020.    EP/Cardiology Office Visits:  04/23/2020 with Dr. Shirlee Latch.    Copy of ICM check sent to Dr. Graciela Husbands.   3 month ICM trend: 04/06/2020.    1 Year ICM trend:       Karie Soda, RN 04/07/2020 12:11 PM

## 2020-04-07 NOTE — Telephone Encounter (Signed)
Remote ICM transmission received.  Attempted ICM call to home and cell number regarding remote transmission.  Home number's mail box full and no answer or voice mail option on mobile phone number.

## 2020-04-08 ENCOUNTER — Encounter (HOSPITAL_COMMUNITY): Payer: Medicare HMO | Admitting: Cardiology

## 2020-04-08 NOTE — Progress Notes (Signed)
If she is taking Lasix 40 mg daily, increase to 40 mg bid x 2 days then 40 qam/20 qpm with BMET in 10 days.

## 2020-04-09 NOTE — Progress Notes (Signed)
Attempted call to patient with Dr McLean's recommendations.Unable to leave message due to mailbox is full.  

## 2020-04-09 NOTE — Progress Notes (Signed)
Attempted call to patient with Dr Alford Highland recommendations.Unable to leave message due to mailbox is full.

## 2020-04-12 NOTE — Progress Notes (Signed)
Unable to reach patient to provide recommendations.

## 2020-04-14 NOTE — Progress Notes (Signed)
Remote ICD transmission.   

## 2020-04-19 ENCOUNTER — Ambulatory Visit (INDEPENDENT_AMBULATORY_CARE_PROVIDER_SITE_OTHER): Payer: Medicare HMO

## 2020-04-19 DIAGNOSIS — Z95810 Presence of automatic (implantable) cardiac defibrillator: Secondary | ICD-10-CM

## 2020-04-19 DIAGNOSIS — I5022 Chronic systolic (congestive) heart failure: Secondary | ICD-10-CM

## 2020-04-20 NOTE — Progress Notes (Signed)
EPIC Encounter for ICM Monitoring  Patient Name: [name] is a 69 y.o. female Date: 04/20/2020 Primary Care Physican: Karl Ito, DO Primary Cardiologist:McLean Electrophysiologist:Klein Bi-V Pacing:96.7% 04/07/2020 Weight:  177 lbs   Transmission reviewed.   Optivol thoracic impedance fluid levels returned to normal even though UNABLE to reach patient for Dr Alford Highland recommendations to increase Lasix on 04/09/2020.   Prescribed: Patient states she is taking Furosemide 40 mg every evening but medication is not listed in Epic.   Potassium 20 mEq take 0.5 tablet daily Spironolactone 25 mg take 1 tablet at bedtime.  Recommendations:No changes.  Follow-up plan: ICM clinic phone appointment on4/25/2022.91 day device clinic remote transmission 07/06/2020.   EP/Cardiology Office Visits: 04/23/2020 with Dr.McLean.   Copy of ICM check sent to Dr.Klein.   3 month ICM trend: 04/19/2020.    1 Year ICM trend:       Karie Soda, RN 04/20/2020 1:53 PM

## 2020-04-23 ENCOUNTER — Ambulatory Visit (HOSPITAL_COMMUNITY)
Admission: RE | Admit: 2020-04-23 | Discharge: 2020-04-23 | Disposition: A | Discharge status: Home / Self Care | Payer: Medicare HMO | Source: Ambulatory Visit | Attending: Cardiology | Admitting: Cardiology

## 2020-04-23 ENCOUNTER — Telehealth (HOSPITAL_COMMUNITY): Payer: Self-pay | Admitting: Vascular Surgery

## 2020-04-23 ENCOUNTER — Encounter (HOSPITAL_COMMUNITY): Payer: Self-pay | Admitting: Cardiology

## 2020-04-23 ENCOUNTER — Other Ambulatory Visit: Payer: Self-pay

## 2020-04-23 VITALS — BP 90/60 | HR 69 | Wt 183.8 lb

## 2020-04-23 DIAGNOSIS — I428 Other cardiomyopathies: Secondary | ICD-10-CM

## 2020-04-23 DIAGNOSIS — Z8249 Family history of ischemic heart disease and other diseases of the circulatory system: Secondary | ICD-10-CM | POA: Insufficient documentation

## 2020-04-23 DIAGNOSIS — Z7984 Long term (current) use of oral hypoglycemic drugs: Secondary | ICD-10-CM | POA: Diagnosis not present

## 2020-04-23 DIAGNOSIS — I5022 Chronic systolic (congestive) heart failure: Secondary | ICD-10-CM | POA: Diagnosis not present

## 2020-04-23 DIAGNOSIS — Z79899 Other long term (current) drug therapy: Secondary | ICD-10-CM | POA: Diagnosis not present

## 2020-04-23 DIAGNOSIS — E785 Hyperlipidemia, unspecified: Secondary | ICD-10-CM | POA: Insufficient documentation

## 2020-04-23 DIAGNOSIS — Z7902 Long term (current) use of antithrombotics/antiplatelets: Secondary | ICD-10-CM | POA: Insufficient documentation

## 2020-04-23 DIAGNOSIS — Z7901 Long term (current) use of anticoagulants: Secondary | ICD-10-CM | POA: Diagnosis not present

## 2020-04-23 DIAGNOSIS — Z8673 Personal history of transient ischemic attack (TIA), and cerebral infarction without residual deficits: Secondary | ICD-10-CM | POA: Diagnosis not present

## 2020-04-23 LAB — BASIC METABOLIC PANEL
Anion gap: 7 (ref 5–15)
BUN: 18 mg/dL (ref 8–23)
CO2: 23 mmol/L (ref 22–32)
Calcium: 8.9 mg/dL (ref 8.9–10.3)
Chloride: 106 mmol/L (ref 98–111)
Creatinine, Ser: 1.2 mg/dL — ABNORMAL HIGH (ref 0.44–1.00)
GFR, Estimated: 48 mL/min — ABNORMAL LOW (ref >60)
Glucose, Bld: 107 mg/dL — ABNORMAL HIGH (ref 70–99)
Potassium: 4 mmol/L (ref 3.5–5.1)
Sodium: 136 mmol/L (ref 135–145)

## 2020-04-23 LAB — LIPID PANEL
Cholesterol: 115 mg/dL (ref 0–200)
HDL: 34 mg/dL — ABNORMAL LOW (ref >40)
LDL Cholesterol: 66 mg/dL (ref 0–99)
Total CHOL/HDL Ratio: 3.4 RATIO
Triglycerides: 77 mg/dL (ref <150)
VLDL: 15 mg/dL (ref 0–40)

## 2020-04-23 MED ORDER — CARVEDILOL 6.25 MG PO TABS: 6.2500 mg | ORAL_TABLET | Freq: Two times a day (BID) | ORAL | 3 refills | Status: DC

## 2020-04-23 NOTE — Patient Instructions (Addendum)
Labs done today. We will contact you only if your labs are abnormal.  No medication changes were made. Please continue all current medications as prescribed.  Your physician recommends that you schedule a follow-up appointment in: 3 months with our NP/PA Clinic here in our office.   If you have any questions or concerns before your next appointment please send us a message through mychart or call our office at [phone].    TO LEAVE A MESSAGE FOR THE NURSE SELECT OPTION 2, PLEASE LEAVE A MESSAGE INCLUDING: . YOUR NAME . DATE OF BIRTH . CALL BACK NUMBER . REASON FOR CALL**this is important as we prioritize the call backs  YOU WILL RECEIVE A CALL BACK THE SAME DAY AS LONG AS YOU CALL BEFORE 4:00 PM   Do the following things EVERYDAY: 1) Weigh yourself in the morning before breakfast. Write it down and keep it in a log. 2) Take your medicines as prescribed 3) Eat low salt foods--Limit salt (sodium) to 2000 mg per day.  4) Stay as active as you can everyday 5) Limit all fluids for the day to less than 2 liters   At [facility], you and your health needs are our priority. As part of our continuing mission to provide you with exceptional heart care, we have created designated Provider Care Teams. These Care Teams include your primary Cardiologist (physician) and Advanced Practice Providers (APPs- Physician Assistants and Nurse Practitioners) who all work together to provide you with the care you need, when you need it.   You may see any of the following providers on your designated Care Team at your next follow up: . Dr Daniel Bensimhon . Dr Dalton McLean . Amy Clegg, NP . Brittainy Simmons, PA . Lauren Kemp, PharmD   Please be sure to bring in all your medications bottles to every appointment.   

## 2020-04-23 NOTE — Telephone Encounter (Signed)
Done

## 2020-04-23 NOTE — Telephone Encounter (Signed)
Pt needs a refill for Carvedilol

## 2020-04-24 NOTE — Progress Notes (Signed)
Patient ID: [name], female   DOB: 1950/08/22, 70 y.o.   MRN: [id number] PCP: Karl Ito, DO EP: Dr Graciela Husbands.  Cardiologist: Dr Shirlee Latch  Neurologist: Dr Pearlean Brownie  HPI: [name] is a 70 y.o. female with chronic systolic CHF with nonischemic cardiomypathy (no significant CAD) s/p Medtronic CRT-D, LBBB, mild-mod MR and CVA (09/2013).  Coronary angiography in 7/15 showed no significant CAD.  Admitted in 9/15 with right sided facial droop and expressive aphasia, CVA on MRI. Had TEE showing EF 15%.    QRS narrowed, so in 11/16 device was reprogrammed to VVI.   Echo in 12/17 showed EF up to 45-50%.  Echo in 3/19 showed EF 45-50% with moderate LVH and normal RV.   Echo was done in 4/21 showing EF decreased to 30-35% with LAD territory wall motion abnormalities.  LHC/RHC in 5/21 showed normal filling pressures and cardiac output, no significant CAD.  With persistent LBBB, CRT was activated by Dr. Graciela Husbands in 9/21.   She returns for followup of CHF.  Weight is down 4 lbs.  No significant exertional dyspnea.  She tripped and fell at [facility] recently; she was not lightheaded.  She hurt her wrist.  No orthopnea/PND.  No chest pain.  No palpitations.   Medtronic device interrogation: >99% BiV pacing, stable thoracic impedance with fluid index < threshold.   Labs (2/21): K 3.8, creatinine 0.96, LDL 105  Labs (9/21): LDL 92 Labs (12/21): K 3.7, creatinine 0.87  PMH: 1. Nonischemic cardiomyopathy: Echo (7/15) with severe LV dilation, EF 25% with diffuse hypokinesis, mild to moderate MR.  RHC/ LHC 08/08/13 with RA 4, PA 23/8, PCWP 11, CI 2.52; no CAD.  TEE (9/15) with EF 15-20%, severe global hypokinesis, no LV thrombus, moderate MR, normal RV.  Echo (10/2013): EF 25-30%, grade I DD, mild AI. Medtronic CRT-D (10/15).  Echo (2/16) with EF 25-30%, diffuse hypokinesis worse in the septum, grade I diastolic dysfunction, normal RV size and systolic function, mild-moderate MR.  - Echo (8/16) with EF  35-40%, diffuse hypokinesis, mild-moderate mitral regurgitation.  - Echo (12/17): EF 45-50%, mild LVH, mild to moderate MR.  - Echo (3/19): EF 45-50% with moderate LVH, mild MR, normal RV size and systolic function.  - Echo (4/21): EF 30-35% with LAD territory wall motion abnormalities. - LHC/RHC (5/21): No significant CAD; mean RA 6, PA 31/13, mean PCWP 13, CI 3.37.  2. CVA: 9/15 with dysarthria.  Carotids 9/15 with no significant CAD.  - Carotid dopplers (10/17): mild plaque, normal subclavians.  3. LBBB: Medtronic CRT-D (10/2013) 4. Hyperlipidemia 5. Cholelithiasis 6. Peripheral arterial dopplers (6/19): Normal  SH: lives with her son and husband. Does not drink or smoke. Currently not working.   FH: Mom had A fib and Heart Failure. Deceased 62        Sister has lupus  ROS: All systems negative except as listed in HPI, PMH and Problem List.   Current Outpatient Medications  Medication Sig Dispense Refill  . albuterol (VENTOLIN HFA) 108 (90 Base) MCG/ACT inhaler Inhale 2 puffs into the lungs at bedtime as needed for wheezing or shortness of breath. 18 g 3  . ALPRAZolam (XANAX) 0.5 MG tablet Take 0.5 mg by mouth at bedtime as needed for anxiety or sleep.     [name] atorvastatin (LIPITOR) 80 MG tablet Take 1 tablet (80 mg total) by mouth daily at 6 PM. 90 tablet 3  . cetirizine (ZYRTEC) 10 MG tablet Take 10 mg by mouth daily.    [name]  clopidogrel (PLAVIX) 75 MG tablet Take 1 tablet (75 mg total) by mouth daily. 90 tablet 3  . dapagliflozin propanediol (FARXIGA) 10 MG TABS tablet Take 1 tablet (10 mg total) by mouth daily before breakfast. 90 tablet 3  . fluticasone (FLONASE) 50 MCG/ACT nasal spray Place 2 sprays into both nostrils daily. 16 g 0  . pantoprazole (PROTONIX) 40 MG tablet Take 40 mg by mouth daily.     . potassium chloride (KLOR-CON) 20 MEQ packet Take 20 mEq by mouth daily.    . sacubitril-valsartan (ENTRESTO) 49-51 MG Take 1 tablet by mouth 2 (two) times daily. 180 tablet 3  .  spironolactone (ALDACTONE) 25 MG tablet Take 1 tablet (25 mg total) by mouth at bedtime. 90 tablet 3  . traMADol (ULTRAM) 50 MG tablet Take 50 mg by mouth every 6 (six) hours as needed for severe pain (arthiritis in knees and ankle).     . carvedilol (COREG) 6.25 MG tablet Take 1 tablet (6.25 mg total) by mouth 2 (two) times daily with a meal. 180 tablet 3   No current facility-administered medications for this encounter.    Vitals:   04/23/20 0824  BP: 90/60  Pulse: 69  SpO2: 97%  Weight: 83.4 kg (183 lb 12.8 oz)    PHYSICAL EXAM: General: NAD Neck: No JVD, no thyromegaly or thyroid nodule.  Lungs: Clear to auscultation bilaterally with normal respiratory effort. CV: Nondisplaced PMI.  Heart regular S1/S2, no S3/S4, no murmur.  No peripheral edema.  No carotid bruit.  Normal pedal pulses.  Abdomen: Soft, nontender, no hepatosplenomegaly, no distention.  Skin: Intact without lesions or rashes.  Neurologic: Alert and oriented x 3.  Psych: Normal affect. Extremities: No clubbing or cyanosis.  HEENT: Normal.   ASSESSMENT & PLAN:  1. Chronic Systolic Heart Failure: Nonischemic cardiomyopathy s/p Medtronic CRT-D in 10/15, coronary angiography without CAD in 07/2013. Echo done in 2/16, EF 25-30%.  Repeat echo in 8/16 with EF up to 35-40%.  Echo in 4/19 showed EF up to 45-50%.  However, echo in 4/21 showed EF back down to 30-35%.  LHC/RHC in 5/21 showed no CAD, normal filling pressures, preserved cardiac output.  Of note, Medtronic device had been reprogrammed to VVI as LBBB has been only intermittent => more recently, LBBB has been persistent and CRT was reactivated in 9/21. NYHA class II. She is not volume overloaded by exam or Optivol.    - Continue Coreg 6.25 mg bid, she did not tolerate increase to 9.375 mg bid.   - Continue Entresto 49/51 bid, no BP room to increase.  BMET today.    - She no longer takes Lasix.  - Continue spironolactone 25 mg daily.  - Continue dapagliflozin 10 mg  daily.   2. CVA: ?Embolic.  No atrial fibrillation has been detected so far on device.  She will continue Plavix for now. If future device interrogation shows atrial fibrillation, she will need to be anticoagulated.   3. Hyperlipidemia: Goal LDL < 70 with history of CVA. - Continue statin, check lipids today.   Followup in 3 months with NP/PA.    [name] 04/24/2020

## 2020-05-10 ENCOUNTER — Ambulatory Visit (INDEPENDENT_AMBULATORY_CARE_PROVIDER_SITE_OTHER): Payer: Medicare HMO

## 2020-05-10 DIAGNOSIS — Z95810 Presence of automatic (implantable) cardiac defibrillator: Secondary | ICD-10-CM

## 2020-05-10 DIAGNOSIS — I5022 Chronic systolic (congestive) heart failure: Secondary | ICD-10-CM | POA: Diagnosis not present

## 2020-05-12 NOTE — Progress Notes (Signed)
EPIC Encounter for ICM Monitoring  Patient Name: GWYN HIERONYMUS is a 70 y.o. female Date: 05/12/2020 Primary Care Physican: Karl Ito, DO Primary Cardiologist:McLean Electrophysiologist:Klein Bi-V Pacing:96.4% 04/23/2020 OfficeWeight:183 lbs   Transmission reviewed.   Optivol thoracic impedancenormal.  Prescribed: Potassium 20 mEq take 1  tablet daily Spironolactone 25 mg take 1 tablet at bedtime.  Recommendations:No changes.  Follow-up plan: ICM clinic phone appointment on6/06/2020.91 day device clinic remote transmission6/21/2022.   EP/Cardiology Office Visits:04/23/2020 with Dr.McLean.   Copy of ICM check sent to Dr.Klein.  3 month ICM trend: 05/10/2020.    1 Year ICM trend:       Karie Soda, RN 05/12/2020 10:57 AM

## 2020-06-17 NOTE — Progress Notes (Signed)
Cardiology Office Note Date:  06/18/2020  Patient ID:  Danielle, Castro Mar 02, 1950, MRN [id number] PCP:  Karl Ito, DO  Cardiologist:  Dr. Shirlee Latch Electrophysiologist: Dr. Graciela Husbands    Chief Complaint: device check  History of Present Illness: [name] is a 70 y.o. female with history of NICM, chronic CHF, LBBB, stroke.   She comes in today to be seen for Dr. Graciela Husbands, last seen by him Sept 2021, at that visit discussed a more persistent LBBB EKG and Dr. Shirlee Latch requested LV lad be programmed on. ECG sinus rhythm with QRS duration of 148 ms left bundle branch block Multiple reprogramming's were attempted.  Unfortunately at 3.5 V every vector attempted resulted in diaphragmatic stimulation.  We then elected 1-4 at 1.5 V at 1.0 ms.  This resulted in narrowing of the QRS by about 20 ms.  At no point could I get QRS upright in lead V1 or negative in lead I without diaphragmatic stimulation   Following regularly with HF team, Dr. Shirlee Latch, most recently 04/23/20. She was doing well, not felt to be volume OL, not on lasix To date not found to have Afib No changes were made.  TODAY She mentions to rooming MA that she was feeling a little lightheaded  BP noted 90/64 (unchanged from her visit with Dr. Shirlee Latch She tells me that she has had a very difficult week, plumbing was out at her home and was without running water for a few days, her husband is quite ill and she tends to keep better track of him then herself. And other personal stressors.  She is observed to have diaphragmatic stim that causes her to occasionally jump/startle.  She says some days are worse then others, some times bothers her more then other days.  It today seems to cause her much anxiety, and have to "breathe through it"  And tended to hyperventilate a bit.  With this mentioned that she again felt a bit lightheaded. This she said happened to her at her last visit to the doctor and drank some water and seemed to feel  better.   I asked how much this bothers her at home, and she says she is unable to lay on her R side and certain positions are worse then others. Feels it most days  She denies any unusual SOB or DOE, no CP, palpitations No real cardiac awareness. No near syncope or syncope.  She does not check her BP at home   Device information MDT CRT-D implanted 11/05/2013   Past Medical History:  Diagnosis Date  . Arthritis   . Automatic implantable cardioverter-defibrillator in situ   . Cardiac resynchronization therapy defibrillator (CRT-D) in place    a. placed on 11/05/13.  [name] GERD (gastroesophageal reflux disease)   . HLD (hyperlipidemia)   . Hypotension   . LBBB (left bundle branch block)   . Mitral regurgitation    a. Mild-mod MR by echo 07/2013.  [name] PONV (postoperative nausea and vomiting)   . Stroke (HCC) 09/23/2013  . Systolic CHF (HCC)    a. Dx 07/2013 - due to NICM. Cath normal cors. EF 25%.    Past Surgical History:  Procedure Laterality Date  . BI-VENTRICULAR IMPLANTABLE CARDIOVERTER DEFIBRILLATOR N/A 11/05/2013   Procedure: BI-VENTRICULAR IMPLANTABLE CARDIOVERTER DEFIBRILLATOR  (CRT-D);  Surgeon: Duke Salvia, MD;  Location: [facility] CATH LAB;  Service: Cardiovascular;  Laterality: N/A;  . BI-VENTRICULAR IMPLANTABLE CARDIOVERTER DEFIBRILLATOR  (CRT-D)  11/05/2013  . CARDIAC CATHETERIZATION  07/2013  .  KNEE ARTHROSCOPY Right   . LEFT AND RIGHT HEART CATHETERIZATION WITH CORONARY ANGIOGRAM N/A 08/08/2013   Procedure: LEFT AND RIGHT HEART CATHETERIZATION WITH CORONARY ANGIOGRAM;  Surgeon: [name], MD;  Location: [facility] CATH LAB;  Service: Cardiovascular;  Laterality: N/A;  . RIGHT/LEFT HEART CATH AND CORONARY ANGIOGRAPHY N/A 06/05/2019   Procedure: RIGHT/LEFT HEART CATH AND CORONARY ANGIOGRAPHY;  Surgeon: [name], MD;  Location: [facility] INVASIVE CV LAB;  Service: Cardiovascular;  Laterality: N/A;  . TEE WITHOUT CARDIOVERSION N/A 09/26/2013   Procedure: TRANSESOPHAGEAL  ECHOCARDIOGRAM (TEE);  Surgeon: [name], MD;  Location: [facility] ENDOSCOPY;  Service: Cardiovascular;  Laterality: N/A;  . TONSILLECTOMY    . WRIST ARTHROSCOPY  12/21/2010   Procedure: ARTHROSCOPY WRIST;  Surgeon: [name], MD;  Location: [facility];  Service: Orthopedics;  Laterality: Right;  right wrist, repair TFCC on right, open reconstruction ECU sheath    Current Outpatient Medications  Medication Sig Dispense Refill  . albuterol (VENTOLIN HFA) 108 (90 Base) MCG/ACT inhaler Inhale 2 puffs into the lungs at bedtime as needed for wheezing or shortness of breath. 18 g 3  . ALPRAZolam (XANAX) 0.5 MG tablet Take 0.5 mg by mouth at bedtime as needed for anxiety or sleep.     [name] atorvastatin (LIPITOR) 80 MG tablet Take 1 tablet (80 mg total) by mouth daily at 6 PM. 90 tablet 3  . carvedilol (COREG) 6.25 MG tablet Take 1 tablet (6.25 mg total) by mouth 2 (two) times daily with a meal. 180 tablet 3  . cetirizine (ZYRTEC) 10 MG tablet Take 10 mg by mouth daily.    . clopidogrel (PLAVIX) 75 MG tablet Take 1 tablet (75 mg total) by mouth daily. 90 tablet 3  . dapagliflozin propanediol (FARXIGA) 10 MG TABS tablet Take 1 tablet (10 mg total) by mouth daily before breakfast. 90 tablet 3  . fluticasone (FLONASE) 50 MCG/ACT nasal spray Place 2 sprays into both nostrils daily. 16 g 0  . pantoprazole (PROTONIX) 40 MG tablet Take 40 mg by mouth daily.     . potassium chloride (KLOR-CON) 20 MEQ packet Take 20 mEq by mouth daily.    . sacubitril-valsartan (ENTRESTO) 49-51 MG Take 1 tablet by mouth 2 (two) times daily. 180 tablet 3  . spironolactone (ALDACTONE) 25 MG tablet Take 1 tablet (25 mg total) by mouth at bedtime. 90 tablet 3  . traMADol (ULTRAM) 50 MG tablet Take 50 mg by mouth every 6 (six) hours as needed for severe pain (arthiritis in knees and ankle).      No current facility-administered medications for this visit.    Allergies:   Onion, Tomato, and Voltaren [diclofenac]    Social History:  The patient  reports that she has never smoked. She has never used smokeless tobacco. She reports that she does not drink alcohol and does not use drugs.   Family History:  The patient's family history includes Cancer (age of onset: 37) in her mother; Cancer (age of onset: 80) in her father; Congestive Heart Failure in her mother; Heart disease in her brother; Lupus in her sister.  ROS:  Please see the history of present illness.    All other systems are reviewed and otherwise negative.   PHYSICAL EXAM:  VS:  BP 90/64   Pulse 62   Ht 5\' 4"  (1.626 m)   Wt 177 lb (80.3 kg)   SpO2 98%   BMI 30.38 kg/m  BMI: Body mass index is 30.38 kg/m.  Well nourished, well developed, in no acute distress HEENT: normocephalic, atraumatic Neck: no JVD, carotid bruits or masses Cardiac:  RRR; no significant murmurs, no rubs, or gallops Lungs:  CTA b/l, no wheezing, rhonchi or rales Abd: soft, nontender MS: no deformity or atrophy Ext: no edema Skin: warm and dry, no rash Neuro:  No gross deficits appreciated Psych: euthymic mood, full affect  ICD site is stable, no tethering or discomfort   EKG:  Done today and reviewed by myself shows  SR, V paced 62bpm, nonspecific ST flattening (no prior paced EKG to compare to  Device interrogation done today and reviewed by myself:  Battery and lead measurements are stable MDT rep is in the office and with assist attempts to eliminate the stim. He got a threshold of 1.0V/1.30ms LV lead programmed with 1/4V marjin at 1.25V/1.19ms (previoiusly was 1.5V)   06/05/2019: LHC 1. Normal filling pressures.  2. Normal cardiac output.  3. No significant coronary disease.    Well-compensated nonischemic cardiomyopathy.    05/15/2019: TTE IMPRESSIONS  1. Left ventricular ejection fraction, by estimation, is 30 to 35%. The  left ventricle has moderately decreased function. The left ventricle  demonstrates regional wall motion abnormalities  (see scoring  diagram/findings for description). Left ventricular  diastolic parameters are consistent with Grade I diastolic dysfunction  (impaired relaxation).  2. Right ventricular systolic function is normal. The right ventricular  size is normal. There is normal pulmonary artery systolic pressure. The  estimated right ventricular systolic pressure is 21.8 mmHg.  3. The mitral valve is normal in structure. Moderate mitral valve  regurgitation. No evidence of mitral stenosis.  4. The aortic valve is normal in structure. Aortic valve regurgitation is  not visualized. No aortic stenosis is present.  5. The inferior vena cava is normal in size with greater than 50%  respiratory variability, suggesting right atrial pressure of 3 mmHg.    Recent Labs: 04/23/2020: BUN 18; Creatinine, Ser 1.20; Potassium 4.0; Sodium 136  04/23/2020: Cholesterol 115; HDL 34; LDL Cholesterol 66; Total CHOL/HDL Ratio 3.4; Triglycerides 77; VLDL 15   CrCl cannot be calculated (Patient's most recent lab result is older than the maximum 21 days allowed.).   Wt Readings from Last 3 Encounters:  06/18/20 177 lb (80.3 kg)  04/23/20 183 lb 12.8 oz (83.4 kg)  01/01/20 187 lb 3.2 oz (84.9 kg)     Other studies reviewed: Additional studies/records reviewed today include: summarized above  ASSESSMENT AND PLAN:  1. ICD      As noted above with symptomatic diaphragmatic stim, today she would audibly startle quite a bit with harder "thumps" and got quite anxious. With industry help was able to eliminate it at least while here She layed down on the bed and when seated as well, we did not observe any and she did not feel any.  I did not notice until after the patient left that rep had programmed device to Adaptive BiV and LV (from adaptive BiV) LV>RV (previiously was RV>LV Paced/sensed delays will be adaptive as they were previously No new EKG was done, though in review of Dr. Odessa Fleming note, no significant EKG  changes were obtained with extensive evaluation at her last visit.   2. NICM 3. Chronic CHF (systolic)     VP 45.0%     She does not appear volume OL by exam, no symptoms     OptiVol recently with sharp drop well below threshold.  Oral intake last couple days not great with home  issues and suspect she may be a bit dry She drank 3 glasses of water here and was feeling better prior to leaving, denies any orthostatic symptoms and able to ambulate at her baseline (with a cane) I asked her to monitor her BP at home, and resume her usual intake, plumbing just back yesterday, she denies having taken any extra diuretics of late  BP was 92/60 prior to leaving   4. Hx of stroke     No AF to date    Disposition: F/u with remotes as usual, She has HF clinic scheduled next month, EP in 48mo, sooner if needed.   Current medicines are reviewed at length with the patient today.  The patient did not have any concerns regarding medicines.  Norma Fredrickson, PA-C 06/18/2020 4:26 PM     [facility] [address] [place] [address] [phone] (office)  [phone] (fax)

## 2020-06-18 ENCOUNTER — Ambulatory Visit (INDEPENDENT_AMBULATORY_CARE_PROVIDER_SITE_OTHER): Payer: Medicare HMO | Admitting: Physician Assistant

## 2020-06-18 ENCOUNTER — Other Ambulatory Visit: Payer: Self-pay

## 2020-06-18 ENCOUNTER — Encounter: Payer: Self-pay | Admitting: Physician Assistant

## 2020-06-18 VITALS — BP 90/64 | HR 62 | Ht 64.0 in | Wt 177.0 lb

## 2020-06-18 DIAGNOSIS — I5022 Chronic systolic (congestive) heart failure: Secondary | ICD-10-CM

## 2020-06-18 DIAGNOSIS — I428 Other cardiomyopathies: Secondary | ICD-10-CM | POA: Diagnosis not present

## 2020-06-18 DIAGNOSIS — Z95810 Presence of automatic (implantable) cardiac defibrillator: Secondary | ICD-10-CM | POA: Diagnosis not present

## 2020-06-18 NOTE — Patient Instructions (Signed)
Medication Instructions:  Your physician recommends that you continue on your current medications as directed. Please refer to the Current Medication list given to you today.   *If you need a refill on your cardiac medications before your next appointment, please call your pharmacy*   Lab Work: None ordered   If you have labs (blood work) drawn today and your tests are completely normal, you will receive your results only by: [name] MyChart Message (if you have MyChart) OR . A paper copy in the mail If you have any lab test that is abnormal or we need to change your treatment, we will call you to review the results.   Testing/Procedures: None ordered    Follow-Up: At [facility], you and your health needs are our priority.  As part of our continuing mission to provide you with exceptional heart care, we have created designated Provider Care Teams.  These Care Teams include your primary Cardiologist (physician) and Advanced Practice Providers (APPs -  Physician Assistants and Nurse Practitioners) who all work together to provide you with the care you need, when you need it.  We recommend signing up for the patient portal called "MyChart".  Sign up information is provided on this After Visit Summary.  MyChart is used to connect with patients for Virtual Visits (Telemedicine).  Patients are able to view lab/test results, encounter notes, upcoming appointments, etc.  Non-urgent messages can be sent to your provider as well.   To learn more about what you can do with MyChart, go to [email or web address].    Your next appointment:   6 month(s)  The format for your next appointment:   In Person  Provider:   You may see Dr. Sherryl Manges or one of the following Advanced Practice Providers on your designated Care Team:    Gypsy Balsam, NP  Francis Dowse, PA-C  Casimiro Needle "Mardelle Matte" Lanna [place], [place]    Other Instructions None

## 2020-06-21 ENCOUNTER — Ambulatory Visit (INDEPENDENT_AMBULATORY_CARE_PROVIDER_SITE_OTHER): Payer: Medicare HMO

## 2020-06-21 DIAGNOSIS — Z95810 Presence of automatic (implantable) cardiac defibrillator: Secondary | ICD-10-CM

## 2020-06-21 DIAGNOSIS — I5022 Chronic systolic (congestive) heart failure: Secondary | ICD-10-CM

## 2020-06-25 NOTE — Progress Notes (Signed)
EPIC Encounter for ICM Monitoring  Patient Name: [name] is a 70 y.o. female Date: 06/25/2020 Primary Care Physican: Karl Ito, DO Primary Cardiologist: Shirlee Latch Electrophysiologist: Joycelyn Schmid Pacing: 97.9%         04/23/2020 Office Weight:  183 lbs                                                            Transmission reviewed.  Patient seen in office for defib check on 06/18/2020.   Optivol thoracic impedance normal.    Prescribed:  Potassium 20 mEq take 1  tablet daily Spironolactone 25 mg take 1 tablet at bedtime.   Recommendations:   No changes.   Follow-up plan: ICM clinic phone appointment on 08/02/2020.   91 day device clinic remote transmission 07/06/2020.     EP/Cardiology Office Visits:  07/27/2020 with HF clinic PA/NP.  Recall 12/15/2020 with Dr Graciela Husbands    Copy of ICM check sent to Dr. Graciela Husbands  3 month ICM trend: 06/21/2020.    1 Year ICM trend:       Karie Soda, RN 06/25/2020 9:04 AM

## 2020-07-06 ENCOUNTER — Ambulatory Visit (INDEPENDENT_AMBULATORY_CARE_PROVIDER_SITE_OTHER): Payer: Medicare HMO

## 2020-07-06 DIAGNOSIS — I428 Other cardiomyopathies: Secondary | ICD-10-CM | POA: Diagnosis not present

## 2020-07-07 LAB — CUP PACEART REMOTE DEVICE CHECK
Battery Remaining Longevity: 18 mo
Battery Voltage: 2.92 V
Brady Statistic AP VP Percent: 0.01 %
Brady Statistic AP VS Percent: 0 %
Brady Statistic AS VP Percent: 98.34 %
Brady Statistic AS VS Percent: 1.65 %
Brady Statistic RA Percent Paced: 0.01 %
Brady Statistic RV Percent Paced: 0.4 %
Date Time Interrogation Session: 20220621022824
HighPow Impedance: 85 Ohm
Implantable Lead Implant Date: 20151021
Implantable Lead Implant Date: 20151021
Implantable Lead Implant Date: 20151021
Implantable Lead Location: 753858
Implantable Lead Location: 753859
Implantable Lead Location: 753860
Implantable Lead Model: 4298
Implantable Lead Model: 5076
Implantable Pulse Generator Implant Date: 20151021
Lead Channel Impedance Value: 1026 Ohm
Lead Channel Impedance Value: 1064 Ohm
Lead Channel Impedance Value: 1083 Ohm
Lead Channel Impedance Value: 1140 Ohm
Lead Channel Impedance Value: 1178 Ohm
Lead Channel Impedance Value: 456 Ohm
Lead Channel Impedance Value: 532 Ohm
Lead Channel Impedance Value: 532 Ohm
Lead Channel Impedance Value: 532 Ohm
Lead Channel Impedance Value: 608 Ohm
Lead Channel Impedance Value: 665 Ohm
Lead Channel Impedance Value: 665 Ohm
Lead Channel Impedance Value: 722 Ohm
Lead Channel Pacing Threshold Amplitude: 0.5 V
Lead Channel Pacing Threshold Amplitude: 0.625 V
Lead Channel Pacing Threshold Amplitude: 1.125 V
Lead Channel Pacing Threshold Pulse Width: 0.4 ms
Lead Channel Pacing Threshold Pulse Width: 0.4 ms
Lead Channel Pacing Threshold Pulse Width: 0.5 ms
Lead Channel Sensing Intrinsic Amplitude: 10.25 mV
Lead Channel Sensing Intrinsic Amplitude: 10.75 mV
Lead Channel Sensing Intrinsic Amplitude: 4.125 mV
Lead Channel Sensing Intrinsic Amplitude: 4.125 mV
Lead Channel Setting Pacing Amplitude: 1.25 V
Lead Channel Setting Pacing Amplitude: 1.5 V
Lead Channel Setting Pacing Amplitude: 2 V
Lead Channel Setting Pacing Pulse Width: 0.4 ms
Lead Channel Setting Pacing Pulse Width: 1 ms
Lead Channel Setting Sensing Sensitivity: 0.3 mV

## 2020-07-26 ENCOUNTER — Encounter (HOSPITAL_COMMUNITY): Payer: Medicare HMO

## 2020-07-26 NOTE — Progress Notes (Signed)
Remote ICD transmission.   

## 2020-07-27 ENCOUNTER — Encounter (HOSPITAL_COMMUNITY): Payer: Medicare HMO

## 2020-08-02 ENCOUNTER — Ambulatory Visit (INDEPENDENT_AMBULATORY_CARE_PROVIDER_SITE_OTHER): Payer: Medicare HMO

## 2020-08-02 DIAGNOSIS — Z95810 Presence of automatic (implantable) cardiac defibrillator: Secondary | ICD-10-CM | POA: Diagnosis not present

## 2020-08-02 DIAGNOSIS — I5022 Chronic systolic (congestive) heart failure: Secondary | ICD-10-CM

## 2020-08-03 NOTE — Progress Notes (Signed)
EPIC Encounter for ICM Monitoring  Patient Name: [name] is a 70 y.o. female Date: 08/03/2020 Primary Care Physican: Karl Ito, DO Primary Cardiologist: Shirlee Latch Electrophysiologist: Joycelyn Schmid Pacing: 97.3%         08/03/2020 Weight:  176-178 lbs                                                            Spoke with patient and heart failure questions reviewed.  Pt reports minimal swelling of both feet.  Breathing and weight are at baseline.  She reports eating salty snacks such as pickle and olives.  She does not monitor food labels but thinks she follows low salt diet most of the time.    Optivol thoracic impedance suggesting possible fluid accumulation starting 07/29/2020.    Prescribed:  Potassium 20 mEq take 1  tablet daily Spironolactone 25 mg take 1 tablet at bedtime.   Recommendations:   Advised to avoid salty snacks and monitor salt intake.  Will send copy to Dr Shirlee Latch for review and recommendations if needed.   Follow-up plan: ICM clinic phone appointment on 08/09/2020 to recheck fluid levels.   91 day device clinic remote transmission 10/06/2020.     EP/Cardiology Office Visits:  08/10/2020 with HF clinic PA/NP.  Recall 12/15/2020 with Dr Graciela Husbands    Copy of ICM check sent to Dr. Graciela Husbands  3 month ICM trend: 08/03/2020.    1 Year ICM trend:       Karie Soda, RN 08/03/2020 9:23 AM

## 2020-08-03 NOTE — Progress Notes (Signed)
Have her take Lasix 20 mg daily x 3 days then stop.  Avoid sodium-rich foods.

## 2020-08-03 NOTE — Progress Notes (Signed)
Spoke with patient to advise of Dr Alford Highland recommendations to take Lasix 20 mg daily.    Patient reports she is taking Lasix 20 mg daily although it is no longer listed in Epic Medication List.     Reviewed HF clinic notes and 01/01/2020 OV note by Tonye Becket, NP instructed patient to stop daily Lasix and start Farxiga.   She did not stop lasix at time of visit as instructed and continues to take 20 mg daily.  Pt confirms she is taking Faxiga and Lasix.    Pt said she will self adjust the Lasix to 2 tablets daily x 2 days and advised I would provide Dr Shirlee Latch with the information that she has self adjusted the medication.    Dr Shirlee Latch: Would you like for me to add Lasix 20 mg daily to patients medication list since she did not stop the medication in December 2021?    Would you recommend she self adjust the Lasix to 40 mg x 2 days and then return to 20 mg daily?

## 2020-08-04 NOTE — Progress Notes (Signed)
Spoke with patient.  She said she took Lasix 40 mg x 2 days and feeling better.  Weight dropped to 175 lbs and she feels lighter.  Advised to continue on the dosage she has been taking which is 20 mg daily until further recommendations from Dr Shirlee Latch.  Will recheck fluid levels on 7/25.

## 2020-08-05 NOTE — Progress Notes (Signed)
Take Lasix 40 mg daily alternating with 20 mg daily.  BMET 10 days.

## 2020-08-06 MED ORDER — FUROSEMIDE 20 MG PO TABS: ORAL_TABLET | ORAL | 3 refills | Status: DC

## 2020-08-06 NOTE — Progress Notes (Addendum)
Received remote transmission.  Spoke with patient and reviewed remote transmission.  Her weight today I 175 lbs and breathing and sleeping has improved at night.  Advised will reschedule next ICM remote transmission from 7/25 to 8/1 instead to allow time to take new alternating Lasix dosage.   08/06/2020 Optivol thoracic impedance suggesting fluid levels returned to normal after taking extra lasix.

## 2020-08-06 NOTE — Progress Notes (Signed)
Spoke with husband, per DPR due to patient was taking daily walk.  Husband is retired Museum/gallery exhibitions officer.  Advised of Dr Alford Highland recommendations:  Take Lasix 20 mg every other day alternating with 40 mg every other day.   BMET in 10 days and scheduled for 08/16/2020 at HF clinic at 10:30 AM.  He verbalized understanding and agreed with plan.  Prescription sent to preferred pharmacy.   Attempted to send updated remote transmission today but received error code.  Provided [employer] tech support number for assistance.    Advised if any questions or concerns to call back.

## 2020-08-10 ENCOUNTER — Encounter (HOSPITAL_COMMUNITY): Payer: Medicare HMO

## 2020-08-16 ENCOUNTER — Other Ambulatory Visit (HOSPITAL_COMMUNITY): Payer: Medicare HMO

## 2020-08-16 ENCOUNTER — Ambulatory Visit (INDEPENDENT_AMBULATORY_CARE_PROVIDER_SITE_OTHER): Payer: Medicare HMO

## 2020-08-16 DIAGNOSIS — Z95810 Presence of automatic (implantable) cardiac defibrillator: Secondary | ICD-10-CM

## 2020-08-16 DIAGNOSIS — I5022 Chronic systolic (congestive) heart failure: Secondary | ICD-10-CM

## 2020-08-16 NOTE — Progress Notes (Signed)
EPIC Encounter for ICM Monitoring  Patient Name: [name] is a 70 y.o. female Date: 08/16/2020 Primary Care Physican: Karl Ito, DO Primary Cardiologist: Shirlee Latch Electrophysiologist: Joycelyn Schmid Pacing: 97.7%         08/03/2020 Weight:  176-178 lbs                                                            Spoke with husband and heart failure questions reviewed.  Pt has no fluid accumulation symptoms at this time.    Optivol thoracic impedance suggesting fluid levels have returned to normal since Furosemide dosage was inscreased.   Prescribed: BMET scheduled 8/2 Furosemide 20 mg Take 1 tablet (20 mg total) every other day alternating with 2 tablets (40 mg total) every other day. Potassium 20 mEq take 1  tablet daily Spironolactone 25 mg take 1 tablet at bedtime.   Labs: 04/23/2020 Creatinine 1.20, BUN 18, Potassium 4.0, Sodium 136, GFR 48  Recommendations:  No changes and encouraged to call if experiencing any fluid symptoms.   Follow-up plan: ICM clinic phone appointment on 09/06/2020.   91 day device clinic remote transmission 10/06/2020.     EP/Cardiology Office Visits:  10/11/2020 with Dr Shirlee Latch (pt does not want to see PA/NP).  Recall 12/15/2020 with Dr Graciela Husbands    Copy of ICM check sent to Dr. Graciela Husbands.   3 month ICM trend: 08/16/2020.    1 Year ICM trend:       Karie Soda, RN 08/16/2020 3:48 PM

## 2020-08-17 ENCOUNTER — Telehealth (HOSPITAL_COMMUNITY): Payer: Self-pay

## 2020-08-17 ENCOUNTER — Other Ambulatory Visit: Payer: Self-pay

## 2020-08-17 ENCOUNTER — Ambulatory Visit (HOSPITAL_COMMUNITY)
Admission: RE | Admit: 2020-08-17 | Discharge: 2020-08-17 | Disposition: A | Discharge status: Home / Self Care | Payer: Medicare HMO | Source: Ambulatory Visit | Attending: Cardiology | Admitting: Cardiology

## 2020-08-17 DIAGNOSIS — I5020 Unspecified systolic (congestive) heart failure: Secondary | ICD-10-CM

## 2020-08-17 DIAGNOSIS — I5022 Chronic systolic (congestive) heart failure: Secondary | ICD-10-CM | POA: Diagnosis not present

## 2020-08-17 LAB — BASIC METABOLIC PANEL
Anion gap: 13 (ref 5–15)
BUN: 14 mg/dL (ref 8–23)
CO2: 23 mmol/L (ref 22–32)
Calcium: 9.6 mg/dL (ref 8.9–10.3)
Chloride: 102 mmol/L (ref 98–111)
Creatinine, Ser: 1.12 mg/dL — ABNORMAL HIGH (ref 0.44–1.00)
GFR, Estimated: 53 mL/min — ABNORMAL LOW (ref >60)
Glucose, Bld: 88 mg/dL (ref 70–99)
Potassium: 3.9 mmol/L (ref 3.5–5.1)
Sodium: 138 mmol/L (ref 135–145)

## 2020-08-17 NOTE — Telephone Encounter (Signed)
Received a fax requesting medical records from [facility]. Records were successfully faxed to: [facility] ,which was the number provided.. Medical request form will be scanned into patients chart.   It was faxed to [phone].

## 2020-09-06 ENCOUNTER — Ambulatory Visit (INDEPENDENT_AMBULATORY_CARE_PROVIDER_SITE_OTHER): Payer: Medicare HMO

## 2020-09-06 ENCOUNTER — Other Ambulatory Visit (HOSPITAL_COMMUNITY): Payer: Medicare HMO

## 2020-09-06 DIAGNOSIS — I5022 Chronic systolic (congestive) heart failure: Secondary | ICD-10-CM | POA: Diagnosis not present

## 2020-09-06 DIAGNOSIS — Z95810 Presence of automatic (implantable) cardiac defibrillator: Secondary | ICD-10-CM

## 2020-09-08 ENCOUNTER — Telehealth: Payer: Self-pay

## 2020-09-08 NOTE — Telephone Encounter (Signed)
Remote ICM transmission received.  Attempted call to patient regarding ICM remote transmission and no answer. Unable to leave message due to voice mail not set up.

## 2020-09-08 NOTE — Progress Notes (Signed)
EPIC Encounter for ICM Monitoring  Patient Name: [name] is a 70 y.o. female Date: 09/08/2020 Primary Care Physican: Karl Ito, DO Primary Cardiologist: Shirlee Latch Electrophysiologist: Joycelyn Schmid Pacing: 98%         08/03/2020 Weight:  176-178 lbs                                                            Attempted call to patient and unable to reach.  Transmission reviewed.    Optivol thoracic impedance normal but was suggesting possible fluid accumulation from 7/29-8/11.   Prescribed:  Furosemide 20 mg Take 1 tablet (20 mg total) every other day alternating with 2 tablets (40 mg total) every other day. Potassium 20 mEq take 1  tablet daily Spironolactone 25 mg take 1 tablet at bedtime.   Labs: 08/17/2020 Creatinine 1.12, BUN 14, Potassium 3.9, Sodium 138, GFR 53 04/23/2020 Creatinine 1.20, BUN 18, Potassium 4.0, Sodium 136, GFR 48   Recommendations:  Unable to reach.     Follow-up plan: ICM clinic phone appointment on 10/18/2020.   91 day device clinic remote transmission 10/06/2020.     EP/Cardiology Office Visits:  10/11/2020 with Dr Shirlee Latch (pt does not want to see PA/NP).  Recall 12/15/2020 with Dr Graciela Husbands    Copy of ICM check sent to Dr. Graciela Husbands.    3 month ICM trend: 09/06/2020.    1 Year ICM trend:       Karie Soda, RN 09/08/2020 4:46 PM

## 2020-09-22 ENCOUNTER — Other Ambulatory Visit (HOSPITAL_COMMUNITY): Payer: Self-pay

## 2020-09-22 MED ORDER — FUROSEMIDE 20 MG PO TABS: ORAL_TABLET | ORAL | 3 refills | Status: DC

## 2020-09-23 ENCOUNTER — Other Ambulatory Visit (HOSPITAL_COMMUNITY): Payer: Self-pay

## 2020-09-23 DIAGNOSIS — I6340 Cerebral infarction due to embolism of unspecified cerebral artery: Secondary | ICD-10-CM

## 2020-09-23 MED ORDER — ATORVASTATIN CALCIUM 80 MG PO TABS: 80.0000 mg | ORAL_TABLET | Freq: Every day | ORAL | 3 refills | Status: DC

## 2020-09-29 ENCOUNTER — Telehealth: Payer: Self-pay | Admitting: Physician Assistant

## 2020-09-29 ENCOUNTER — Other Ambulatory Visit: Payer: Self-pay | Admitting: Physician Assistant

## 2020-09-29 MED ORDER — ENTRESTO 49-51 MG PO TABS: 1.0000 | ORAL_TABLET | Freq: Two times a day (BID) | ORAL | 3 refills | Status: DC

## 2020-09-29 MED ORDER — POTASSIUM CHLORIDE CRYS ER 20 MEQ PO TBCR: 20.0000 meq | EXTENDED_RELEASE_TABLET | Freq: Every day | ORAL | 0 refills | Status: DC

## 2020-09-29 NOTE — Telephone Encounter (Signed)
Patient contacted after our answering service as she has ran out of her medications.  I have sent in a refill on Entresto.  She says that her pharmacy would not give her any more Lasix citing it was too soon to pick up a new Rx, I ended up calling her pharmacy and clarified the issue, she should be able to pick up the Lasix prescription that was sent over last week at this point.  I also sent in a refill on the potassium supplement, previous prescription was sent he has a potassium powder which she is unable to take due to bad taste.  I have sent in the potassium chloride 20 mEq daily.  However patient says she is actually taking potassium chloride 40 mEq daily instead of 20 mEq.  I do not see the 40 mEq dosage listed in her medication list, I will forward to Dr. Shirlee Latch to see if he thinks the patient should be on 20 or 40 mEq dosing of potassium.  For the time being, I only sent in a 30 tablets of potassium 20 mEq until the exact dosage is clarified.

## 2020-09-30 ENCOUNTER — Other Ambulatory Visit (HOSPITAL_COMMUNITY): Payer: Self-pay | Admitting: *Deleted

## 2020-09-30 NOTE — Telephone Encounter (Signed)
See what she has been taking. If she has been taking 40 mEq daily KCl, would continue that.

## 2020-09-30 NOTE — Telephone Encounter (Signed)
Attempted to call pt to clarify, no answer and VM full

## 2020-10-05 ENCOUNTER — Ambulatory Visit (INDEPENDENT_AMBULATORY_CARE_PROVIDER_SITE_OTHER): Payer: Medicare Other

## 2020-10-05 DIAGNOSIS — I428 Other cardiomyopathies: Secondary | ICD-10-CM | POA: Diagnosis not present

## 2020-10-05 LAB — CUP PACEART REMOTE DEVICE CHECK
Battery Remaining Longevity: 18 mo
Battery Voltage: 2.91 V
Brady Statistic AP VP Percent: 0.05 %
Brady Statistic AP VS Percent: 0.01 %
Brady Statistic AS VP Percent: 98.31 %
Brady Statistic AS VS Percent: 1.64 %
Brady Statistic RA Percent Paced: 0.05 %
Brady Statistic RV Percent Paced: 0.58 %
Date Time Interrogation Session: 20220920112606
HighPow Impedance: 96 Ohm
Implantable Lead Implant Date: 20151021
Implantable Lead Implant Date: 20151021
Implantable Lead Implant Date: 20151021
Implantable Lead Location: 753858
Implantable Lead Location: 753859
Implantable Lead Location: 753860
Implantable Lead Model: 4298
Implantable Lead Model: 5076
Implantable Pulse Generator Implant Date: 20151021
Lead Channel Impedance Value: 1235 Ohm
Lead Channel Impedance Value: 1254 Ohm
Lead Channel Impedance Value: 1292 Ohm
Lead Channel Impedance Value: 1292 Ohm
Lead Channel Impedance Value: 1349 Ohm
Lead Channel Impedance Value: 475 Ohm
Lead Channel Impedance Value: 589 Ohm
Lead Channel Impedance Value: 589 Ohm
Lead Channel Impedance Value: 646 Ohm
Lead Channel Impedance Value: 760 Ohm
Lead Channel Impedance Value: 779 Ohm
Lead Channel Impedance Value: 779 Ohm
Lead Channel Impedance Value: 874 Ohm
Lead Channel Pacing Threshold Amplitude: 0.5 V
Lead Channel Pacing Threshold Amplitude: 0.5 V
Lead Channel Pacing Threshold Amplitude: 1.125 V
Lead Channel Pacing Threshold Pulse Width: 0.4 ms
Lead Channel Pacing Threshold Pulse Width: 0.4 ms
Lead Channel Pacing Threshold Pulse Width: 0.5 ms
Lead Channel Sensing Intrinsic Amplitude: 10.25 mV
Lead Channel Sensing Intrinsic Amplitude: 10.75 mV
Lead Channel Sensing Intrinsic Amplitude: 4 mV
Lead Channel Sensing Intrinsic Amplitude: 4 mV
Lead Channel Setting Pacing Amplitude: 1.25 V
Lead Channel Setting Pacing Amplitude: 1.5 V
Lead Channel Setting Pacing Amplitude: 2 V
Lead Channel Setting Pacing Pulse Width: 0.4 ms
Lead Channel Setting Pacing Pulse Width: 1 ms
Lead Channel Setting Sensing Sensitivity: 0.3 mV

## 2020-10-11 ENCOUNTER — Ambulatory Visit (HOSPITAL_COMMUNITY)
Admission: RE | Admit: 2020-10-11 | Discharge: 2020-10-11 | Disposition: A | Discharge status: Home / Self Care | Payer: Medicare Other | Source: Ambulatory Visit | Attending: Cardiology | Admitting: Cardiology

## 2020-10-11 ENCOUNTER — Encounter (HOSPITAL_COMMUNITY): Payer: Self-pay | Admitting: Cardiology

## 2020-10-11 ENCOUNTER — Other Ambulatory Visit: Payer: Self-pay

## 2020-10-11 VITALS — BP 164/82 | HR 60 | Temp 97.0°F | Resp 16 | Wt 176.8 lb

## 2020-10-11 DIAGNOSIS — R5383 Other fatigue: Secondary | ICD-10-CM | POA: Insufficient documentation

## 2020-10-11 DIAGNOSIS — I5020 Unspecified systolic (congestive) heart failure: Secondary | ICD-10-CM | POA: Diagnosis not present

## 2020-10-11 DIAGNOSIS — Z95810 Presence of automatic (implantable) cardiac defibrillator: Secondary | ICD-10-CM | POA: Insufficient documentation

## 2020-10-11 DIAGNOSIS — I5022 Chronic systolic (congestive) heart failure: Secondary | ICD-10-CM | POA: Diagnosis present

## 2020-10-11 DIAGNOSIS — Z8249 Family history of ischemic heart disease and other diseases of the circulatory system: Secondary | ICD-10-CM | POA: Insufficient documentation

## 2020-10-11 DIAGNOSIS — Z006 Encounter for examination for normal comparison and control in clinical research program: Secondary | ICD-10-CM

## 2020-10-11 DIAGNOSIS — I447 Left bundle-branch block, unspecified: Secondary | ICD-10-CM | POA: Insufficient documentation

## 2020-10-11 DIAGNOSIS — Z79899 Other long term (current) drug therapy: Secondary | ICD-10-CM | POA: Diagnosis not present

## 2020-10-11 DIAGNOSIS — Z8673 Personal history of transient ischemic attack (TIA), and cerebral infarction without residual deficits: Secondary | ICD-10-CM | POA: Diagnosis not present

## 2020-10-11 DIAGNOSIS — I428 Other cardiomyopathies: Secondary | ICD-10-CM | POA: Diagnosis not present

## 2020-10-11 DIAGNOSIS — Z7902 Long term (current) use of antithrombotics/antiplatelets: Secondary | ICD-10-CM | POA: Diagnosis not present

## 2020-10-11 DIAGNOSIS — E785 Hyperlipidemia, unspecified: Secondary | ICD-10-CM | POA: Insufficient documentation

## 2020-10-11 LAB — BASIC METABOLIC PANEL
Anion gap: 8 (ref 5–15)
BUN: 38 mg/dL — ABNORMAL HIGH (ref 8–23)
CO2: 20 mmol/L — ABNORMAL LOW (ref 22–32)
Calcium: 9.4 mg/dL (ref 8.9–10.3)
Chloride: 107 mmol/L (ref 98–111)
Creatinine, Ser: 1.83 mg/dL — ABNORMAL HIGH (ref 0.44–1.00)
GFR, Estimated: 29 mL/min — ABNORMAL LOW (ref >60)
Glucose, Bld: 87 mg/dL (ref 70–99)
Potassium: 5.2 mmol/L — ABNORMAL HIGH (ref 3.5–5.1)
Sodium: 135 mmol/L (ref 135–145)

## 2020-10-11 LAB — BRAIN NATRIURETIC PEPTIDE: B Natriuretic Peptide: 35.5 pg/mL (ref 0.0–100.0)

## 2020-10-11 MED ORDER — FUROSEMIDE 20 MG PO TABS: 20.0000 mg | ORAL_TABLET | Freq: Every day | ORAL | 3 refills | Status: DC

## 2020-10-11 NOTE — Research (Addendum)
Spoke with patient regarding Anthem HF study. Will follow labs and schedule screening visit if labs meet criteria.  BNP resulted back at only 35.5 pg/ml. Patient does not meet Anthem HF inclusion at this time.

## 2020-10-11 NOTE — Patient Instructions (Addendum)
EKG done today.  Labs done today. We will contact you only if your labs are abnormal.  HOLD LASIX FOR 1 DAY THEN DECREASE TO 20MG  (1 TABLET) BY MOUTH DAILY.   No other medication changes were made. Please continue all current medications as prescribed.  Your physician recommends that you schedule a follow-up appointment in: 3 months with our [facility] here in our office  If you have any questions or concerns before your next appointment please send a message through [place] or call our office at [phone].    TO LEAVE A MESSAGE FOR THE NURSE SELECT OPTION 2, PLEASE LEAVE A MESSAGE INCLUDING: YOUR NAME DATE OF BIRTH CALL BACK NUMBER REASON FOR CALL**this is important as we prioritize the call backs  YOU WILL RECEIVE A CALL BACK THE SAME DAY AS LONG AS YOU CALL BEFORE 4:00 PM   Do the following things EVERYDAY: Weigh yourself in the morning before breakfast. Write it down and keep it in a log. Take your medicines as prescribed Eat low salt foods--Limit salt (sodium) to 2000 mg per day.  Stay as active as you can everyday Limit all fluids for the day to less than 2 liters   At [facility], you and your health needs are our priority. As part of our continuing mission to provide you with exceptional heart care, we have created designated Provider Care Teams. These Care Teams include your primary Cardiologist (physician) and Advanced Practice Providers (APPs- Physician Assistants and Nurse Practitioners) who all work together to provide you with the care you need, when you need it.   You may see any of the following providers on your designated Care Team at your next follow up: Dr [phone] Dr Arvilla Meres, NP Carron Curie, Robbie Lis [place], PharmD   Please be sure to bring in all your medications bottles to every appointment.

## 2020-10-11 NOTE — Progress Notes (Signed)
Patient ID: [name], female   DOB: [birth year], 70 y.o.   MRN: [id number] PCP: Karl Ito, DO EP: Dr Graciela Husbands.  Cardiologist: Dr Shirlee Latch  Neurologist: Dr Pearlean Brownie  HPI: Ms. Meleski is a 70 y.o. female with chronic systolic CHF with nonischemic cardiomypathy (no significant CAD) s/p Medtronic CRT-D, LBBB, mild-mod MR and CVA (09/2013).  Coronary angiography in 7/15 showed no significant CAD.  Admitted in 9/15 with right sided facial droop and expressive aphasia, CVA on MRI. Had TEE showing EF 15%.    QRS narrowed, so in 11/16 device was reprogrammed to VVI.   Echo in 12/17 showed EF up to 45-50%.  Echo in 3/19 showed EF 45-50% with moderate LVH and normal RV.   Echo was done in 4/21 showing EF decreased to 30-35% with LAD territory wall motion abnormalities.  LHC/RHC in 5/21 showed normal filling pressures and cardiac output, no significant CAD.  With persistent LBBB, CRT was activated by Dr. Graciela Husbands in 9/21.   She returns for followup of CHF.  Weight is down 7 lbs.  She has episodes of lightheadedness and nausea.  No syncope.  No dyspnea walking on flat ground.  No chest pain.  No orthopnea/PND.  She gets fatigued easily with most exertion.    Labs (2/21): K 3.8, creatinine 0.96, LDL 105  Labs (9/21): LDL 92 Labs (12/21): K 3.7, creatinine 0.87 Labs (4/22): LDL 66 Labs (8/22): K 3.9, creatinine 1.12  PMH: 1. Nonischemic cardiomyopathy: Echo (7/15) with severe LV dilation, EF 25% with diffuse hypokinesis, mild to moderate MR.  RHC/ LHC 08/08/13 with RA 4, PA 23/8, PCWP 11, CI 2.52; no CAD.  TEE (9/15) with EF 15-20%, severe global hypokinesis, no LV thrombus, moderate MR, normal RV.  Echo (10/2013): EF 25-30%, grade I DD, mild AI. Medtronic CRT-D (10/15).  Echo (2/16) with EF 25-30%, diffuse hypokinesis worse in the septum, grade I diastolic dysfunction, normal RV size and systolic function, mild-moderate MR.  - Echo (8/16) with EF 35-40%, diffuse hypokinesis, mild-moderate mitral  regurgitation.  - Echo (12/17): EF 45-50%, mild LVH, mild to moderate MR.  - Echo (3/19): EF 45-50% with moderate LVH, mild MR, normal RV size and systolic function.  - Echo (4/21): EF 30-35% with LAD territory wall motion abnormalities. - LHC/RHC (5/21): No significant CAD; mean RA 6, PA 31/13, mean PCWP 13, CI 3.37.  2. CVA: 9/15 with dysarthria.  Carotids 9/15 with no significant CAD.  - Carotid dopplers (10/17): mild plaque, normal subclavians.  3. LBBB: Medtronic CRT-D (10/2013) 4. Hyperlipidemia 5. Cholelithiasis 6. Peripheral arterial dopplers (6/19): Normal  SH: lives with her son and husband. Does not drink or smoke. Currently not working.   FH: Mom had A fib and Heart Failure. Deceased 44        Sister has lupus  ROS: All systems negative except as listed in HPI, PMH and Problem List.   Current Outpatient Medications  Medication Sig Dispense Refill   albuterol (VENTOLIN HFA) 108 (90 Base) MCG/ACT inhaler Inhale 2 puffs into the lungs at bedtime as needed for wheezing or shortness of breath. 18 g 3   atorvastatin (LIPITOR) 80 MG tablet Take 1 tablet (80 mg total) by mouth daily at 6 PM. 90 tablet 3   carvedilol (COREG) 6.25 MG tablet Take 1 tablet (6.25 mg total) by mouth 2 (two) times daily with a meal. 180 tablet 3   cetirizine (ZYRTEC) 10 MG tablet Take 10 mg by mouth daily.     clopidogrel (PLAVIX)  75 MG tablet Take 1 tablet (75 mg total) by mouth daily. 90 tablet 3   dapagliflozin propanediol (FARXIGA) 10 MG TABS tablet Take 1 tablet (10 mg total) by mouth daily before breakfast. 90 tablet 3   fluticasone (FLONASE) 50 MCG/ACT nasal spray Place 2 sprays into both nostrils daily. 16 g 0   pantoprazole (PROTONIX) 40 MG tablet Take 40 mg by mouth daily.      potassium chloride SA (KLOR-CON) 20 MEQ tablet Take 1 tablet (20 mEq total) by mouth daily. 30 tablet 0   sacubitril-valsartan (ENTRESTO) 49-51 MG Take 1 tablet by mouth 2 (two) times daily. 180 tablet 3    spironolactone (ALDACTONE) 25 MG tablet Take 1 tablet (25 mg total) by mouth at bedtime. 90 tablet 3   furosemide (LASIX) 20 MG tablet Take 1 tablet (20 mg total) by mouth daily. 90 tablet 3   No current facility-administered medications for this encounter.    Vitals:   10/11/20 0916  BP: 100/60  Pulse: 64  SpO2: 100%  Weight: 80.2 kg (176 lb 12.8 oz)    PHYSICAL EXAM: General: NAD Neck: No JVD, no thyromegaly or thyroid nodule.  Lungs: Clear to auscultation bilaterally with normal respiratory effort. CV: Nondisplaced PMI.  Heart regular S1/S2, no S3/S4, no murmur.  No peripheral edema.  No carotid bruit.  Normal pedal pulses.  Abdomen: Soft, nontender, no hepatosplenomegaly, no distention.  Skin: Intact without lesions or rashes.  Neurologic: Alert and oriented x 3.  Psych: Normal affect. Extremities: No clubbing or cyanosis.  HEENT: Normal.   ASSESSMENT & PLAN:  1. Chronic Systolic Heart Failure: Nonischemic cardiomyopathy s/p Medtronic CRT-D in 10/15, coronary angiography without CAD in 07/2013. Echo done in 2/16, EF 25-30%.  Repeat echo in 8/16 with EF up to 35-40%.  Echo in 4/19 showed EF up to 45-50%.  However, echo in 4/21 showed EF back down to 30-35%.  LHC/RHC in 5/21 showed no CAD, normal filling pressures, preserved cardiac output.  Of note, Medtronic device had been reprogrammed to VVI as LBBB has been only intermittent => more recently, LBBB has been persistent and CRT was reactivated in 9/21. NYHA class III, primarily due to fatigue. She is not volume overloaded by exam.    - Continue Coreg 6.25 mg bid, she did not tolerate increase to 9.375 mg bid.   - Continue Entresto 49/51 bid, no BP room to increase.  BMET/BNP today.    - With episodes of lightheadedness and no volume overload, I will hold Lasix for 1 day then decrease Lasix to 20 mg daily.  - Continue spironolactone 25 mg daily.  - Continue dapagliflozin 10 mg daily.   - I will evaluate her for ANTHEM study.  -  She needs repeat echo.  2. CVA: ?Embolic.  No atrial fibrillation has been detected so far on device.  She will continue Plavix for now. If future device interrogation shows atrial fibrillation, she will need to be anticoagulated.   3. Hyperlipidemia: Goal LDL < 70 with history of CVA.  Followup in 3 months with NP/PA.    [name] 10/11/2020

## 2020-10-12 ENCOUNTER — Telehealth (HOSPITAL_COMMUNITY): Payer: Self-pay | Admitting: *Deleted

## 2020-10-12 NOTE — Telephone Encounter (Signed)
Pt aware, agreeable, and verbalized understanding. She weighs daily and will let us know if wt increasing

## 2020-10-12 NOTE — Progress Notes (Signed)
Remote ICD transmission.   

## 2020-10-12 NOTE — Telephone Encounter (Signed)
-----   Message from [name], MD sent at 10/11/2020  4:57 PM EDT ----- Stop KCl and stop Lasix completely for now.  Weight daily and call in if weight rises more than 2 lbs overnight or 4 lbs in a week.

## 2020-10-18 ENCOUNTER — Ambulatory Visit (INDEPENDENT_AMBULATORY_CARE_PROVIDER_SITE_OTHER): Payer: Medicare HMO

## 2020-10-18 DIAGNOSIS — Z95810 Presence of automatic (implantable) cardiac defibrillator: Secondary | ICD-10-CM

## 2020-10-18 DIAGNOSIS — I5022 Chronic systolic (congestive) heart failure: Secondary | ICD-10-CM

## 2020-10-19 MED ORDER — FUROSEMIDE 20 MG PO TABS: 20.0000 mg | ORAL_TABLET | Freq: Every day | ORAL | 3 refills | Status: DC

## 2020-10-19 NOTE — Progress Notes (Signed)
Spoke with patient.  Advised Dr Shirlee Latch recommended she continue with Lasix 20 mg daily and limit salt in diet.  Scheduled BMET for 10/12.  Pt agreeable to plan and verbalized understanding.  Advised to continue to monitor weight and all if increases.

## 2020-10-19 NOTE — Progress Notes (Signed)
EPIC Encounter for ICM Monitoring  Patient Name: [name] is a 70 y.o. female Date: 10/19/2020 Primary Care Physican: Karl Ito, DO Primary Cardiologist: Shirlee Latch Electrophysiologist: Joycelyn Schmid Pacing: 98%         10/19/2020 Weight:  177 lbs                                                          Spoke with patient and heart failure questions reviewed.  Pt reporting ankle swelling but is close to resolving.  Her weight is stable at 176-177 lbs for the last 2 weeks.   Per 9/27 phone note she was instructed to stop Lasix and Potassium, weigh daily and call in if weight rises more than 2 lbs overnight or 4 lbs in a week.   Pt reports she stopped stopped as Potassium as instructed.  She did not stop Lasix as recommended per 9/27 phone call from [facility] at [facility] clinic.   Optivol thoracic impedance suggesting possible fluid accumulation starting 9/26.     Prescribed:  Furosemide was stopped on 9/27 Spironolactone 25 mg take 1 tablet at bedtime.   Labs: 10/11/2020 Creatinine 1.83, BUN 38, Potassium 5.2, Sodium 135, GFR 29 08/17/2020 Creatinine 1.12, BUN 14, Potassium 3.9, Sodium 138, GFR 53 04/23/2020 Creatinine 1.20, BUN 18, Potassium 4.0, Sodium 136, GFR 48   Recommendations:  Pt has continued to take Lasix 20 mg daily despite recommendation to stop Lasix per 10/12/2020 phone call.  Copy sent to Dr Shirlee Latch for review and recommendations if needed.   Follow-up plan: ICM clinic phone appointment on 10/26/2020.   91 day device clinic remote transmission 01/04/2021.     EP/Cardiology Office Visits:  01/11/2021 with Dr Shirlee Latch.  Recall 12/15/2020 with Dr Graciela Husbands    Copy of ICM check sent to Dr. Graciela Husbands.     3 month ICM trend: 10/18/2020.    1 Year ICM trend:       Karie Soda, RN 10/19/2020 12:09 PM

## 2020-10-19 NOTE — Progress Notes (Signed)
Would not change Lasix with recent rise in creatinine.  Would just get repeat BMET in a week or so.  Cut back on dietary sodium.

## 2020-10-25 ENCOUNTER — Ambulatory Visit (INDEPENDENT_AMBULATORY_CARE_PROVIDER_SITE_OTHER): Payer: Medicare Other

## 2020-10-25 DIAGNOSIS — I5022 Chronic systolic (congestive) heart failure: Secondary | ICD-10-CM

## 2020-10-25 DIAGNOSIS — Z95810 Presence of automatic (implantable) cardiac defibrillator: Secondary | ICD-10-CM

## 2020-10-26 NOTE — Progress Notes (Signed)
EPIC Encounter for ICM Monitoring  Patient Name: [name] is a 69 y.o. female Date: 10/26/2020 Primary Care Physican: Karl Ito, DO Primary Cardiologist: Shirlee Latch Electrophysiologist: Joycelyn Schmid Pacing: 97.8%         10/19/2020 Weight:  177 lbs                                                          Transmission reviewed   Optivol thoracic impedance suggesting possible ongoing fluid accumulation starting 9/26.     Prescribed:  Furosemide 20 mg take 1 tablet daily  Spironolactone 25 mg take 1 tablet at bedtime.   Labs: BMET scheduled for 10/27/2020 10/11/2020 Creatinine 1.83, BUN 38, Potassium 5.2, Sodium 135, GFR 29 08/17/2020 Creatinine 1.12, BUN 14, Potassium 3.9, Sodium 138, GFR 53 04/23/2020 Creatinine 1.20, BUN 18, Potassium 4.0, Sodium 136, GFR 48   Recommendations:  Will recheck fluid levels 10/17.   Follow-up plan: ICM clinic phone appointment on 11/01/2020 to recheck fluid levels.   91 day device clinic remote transmission 01/04/2021.     EP/Cardiology Office Visits:  01/11/2021 with Dr Shirlee Latch.  Recall 12/15/2020 with Dr Graciela Husbands    Copy of ICM check sent to Dr. Graciela Husbands.    3 month ICM trend: 10/25/2020.    1 Year ICM trend:       Karie Soda, RN 10/26/2020 12:57 PM

## 2020-10-27 ENCOUNTER — Other Ambulatory Visit: Payer: Self-pay

## 2020-10-27 ENCOUNTER — Ambulatory Visit (HOSPITAL_COMMUNITY)
Admission: RE | Admit: 2020-10-27 | Discharge: 2020-10-27 | Disposition: A | Discharge status: Home / Self Care | Payer: Medicare Other | Source: Ambulatory Visit | Attending: Internal Medicine | Admitting: Internal Medicine

## 2020-10-27 DIAGNOSIS — I5022 Chronic systolic (congestive) heart failure: Secondary | ICD-10-CM | POA: Insufficient documentation

## 2020-10-27 LAB — BASIC METABOLIC PANEL
Anion gap: 9 (ref 5–15)
BUN: 11 mg/dL (ref 8–23)
CO2: 24 mmol/L (ref 22–32)
Calcium: 9.2 mg/dL (ref 8.9–10.3)
Chloride: 106 mmol/L (ref 98–111)
Creatinine, Ser: 1.19 mg/dL — ABNORMAL HIGH (ref 0.44–1.00)
GFR, Estimated: 49 mL/min — ABNORMAL LOW (ref >60)
Glucose, Bld: 104 mg/dL — ABNORMAL HIGH (ref 70–99)
Potassium: 3.5 mmol/L (ref 3.5–5.1)
Sodium: 139 mmol/L (ref 135–145)

## 2020-11-01 ENCOUNTER — Ambulatory Visit (INDEPENDENT_AMBULATORY_CARE_PROVIDER_SITE_OTHER): Payer: Medicare Other

## 2020-11-01 DIAGNOSIS — I5022 Chronic systolic (congestive) heart failure: Secondary | ICD-10-CM

## 2020-11-01 DIAGNOSIS — Z95810 Presence of automatic (implantable) cardiac defibrillator: Secondary | ICD-10-CM

## 2020-11-03 ENCOUNTER — Telehealth: Payer: Self-pay

## 2020-11-03 NOTE — Progress Notes (Signed)
EPIC Encounter for ICM Monitoring  Patient Name: [name] is a 70 y.o. female Date: 11/03/2020 Primary Care Physican: Karl Ito, DO Primary Cardiologist: Shirlee Latch Electrophysiologist: Joycelyn Schmid Pacing: 97.6%         10/19/2020 Weight:  177 lbs                                                          Attempted call to patient and unable to reach.   Transmission reviewed.    Optivol thoracic impedance suggesting possible ongoing fluid accumulation starting 9/26.   Optivol Fluid Index > normal threshold starting 10/11.   Prescribed:  Furosemide 20 mg take 1 tablet daily  Spironolactone 25 mg take 1 tablet at bedtime.   Labs:  10/27/2020 Creatinine 1.19, BUN 11, Potassium 3.5, Sodium 139, GFR 49 10/11/2020 Creatinine 1.83, BUN 38, Potassium 5.2, Sodium 135, GFR 29 08/17/2020 Creatinine 1.12, BUN 14, Potassium 3.9, Sodium 138, GFR 53 04/23/2020 Creatinine 1.20, BUN 18, Potassium 4.0, Sodium 136, GFR 48   Recommendations:  Unable to reach.  Will send copy to Dr Shirlee Latch for review and recommendations if patient is reached.   Follow-up plan: ICM clinic phone appointment on 11/15/2020 to recheck fluid levels.   91 day device clinic remote transmission 01/04/2021.     EP/Cardiology Office Visits:  01/11/2021 with [facility] PA/NP.  Recall 12/15/2020 with Dr Graciela Husbands    Copy of ICM check sent to Dr. Graciela Husbands.    3 month ICM trend: 11/02/2020.    1 Year ICM trend:       Karie Soda, RN 11/03/2020 8:05 AM

## 2020-11-03 NOTE — Telephone Encounter (Signed)
Remote ICM transmission received.  Attempted call to patient regarding ICM remote transmission and no answer.  Voice mail not set up.

## 2020-11-15 ENCOUNTER — Ambulatory Visit (INDEPENDENT_AMBULATORY_CARE_PROVIDER_SITE_OTHER): Payer: Medicare Other

## 2020-11-15 DIAGNOSIS — Z95810 Presence of automatic (implantable) cardiac defibrillator: Secondary | ICD-10-CM

## 2020-11-15 DIAGNOSIS — I5022 Chronic systolic (congestive) heart failure: Secondary | ICD-10-CM

## 2020-11-15 MED ORDER — FUROSEMIDE 20 MG PO TABS: 40.0000 mg | ORAL_TABLET | Freq: Every day | ORAL | 3 refills | Status: DC

## 2020-11-15 NOTE — Progress Notes (Signed)
Increase Lasix to 40 mg daily, BMET 1 week.

## 2020-11-15 NOTE — Progress Notes (Signed)
Spoke with patient and advised of Dr Alford Highland recommendation:  Increase Furosemide 20 mg to 2 tablets (40 mg total) by mouth every morning BMET in a week.  She agreed to lab appt on 11/24/2020.  Advised will recheck fluid levels 11/7 and try to limit salt intake.  Call back for any questions or changes in condition.

## 2020-11-15 NOTE — Progress Notes (Signed)
EPIC Encounter for ICM Monitoring  Patient Name: [name] is a 70 y.o. female Date: 11/15/2020 Primary Care Physican: Karl Ito, DO Primary Cardiologist: Shirlee Latch Electrophysiologist: Joycelyn Schmid Pacing: 97.7%         10/19/2020 Weight:  177 lbs 11/15/2020 Weight: 175 lbs                                                          Spoke with patient and heart failure questions reviewed.  Pt asymptomatic for fluid accumulation but does have a cough which she thinks is a cold for the past few days.   Optivol thoracic impedance suggesting possible ongoing fluid accumulation starting 9/26.   Optivol Fluid Index > normal threshold starting 10/11.   Prescribed:  Furosemide 20 mg take 1 tablet daily.  Confirmed 10/31 she is taking daily. Spironolactone 25 mg take 1 tablet at bedtime.   Labs:  10/27/2020 Creatinine 1.19, BUN 11, Potassium 3.5, Sodium 139, GFR 49 10/11/2020 Creatinine 1.83, BUN 38, Potassium 5.2, Sodium 135, GFR 29 08/17/2020 Creatinine 1.12, BUN 14, Potassium 3.9, Sodium 138, GFR 53 04/23/2020 Creatinine 1.20, BUN 18, Potassium 4.0, Sodium 136, GFR 48   Recommendations:  Copy sent to Dr Shirlee Latch for review and recommendations.    Follow-up plan: ICM clinic phone appointment on 11/22/2020 for 31 day and to recheck fluid levels.   91 day device clinic remote transmission 01/04/2021.     EP/Cardiology Office Visits:  01/11/2021 with [facility] PA/NP.  Recall 12/15/2020 with Dr Graciela Husbands    Copy of ICM check sent to Dr. Graciela Husbands.     3 month ICM trend: 11/15/2020.    1 Year ICM trend:       Karie Soda, RN 11/15/2020 12:23 PM

## 2020-11-16 ENCOUNTER — Ambulatory Visit: Payer: Medicare Other | Admitting: Internal Medicine

## 2020-11-22 ENCOUNTER — Ambulatory Visit (INDEPENDENT_AMBULATORY_CARE_PROVIDER_SITE_OTHER): Payer: Medicare Other

## 2020-11-22 DIAGNOSIS — I5022 Chronic systolic (congestive) heart failure: Secondary | ICD-10-CM | POA: Diagnosis not present

## 2020-11-22 DIAGNOSIS — Z95810 Presence of automatic (implantable) cardiac defibrillator: Secondary | ICD-10-CM

## 2020-11-23 NOTE — Progress Notes (Signed)
EPIC Encounter for ICM Monitoring  Patient Name: [name] is a 70 y.o. female Date: 11/23/2020 Primary Care Physican: Karl Ito, DO Primary Cardiologist: Shirlee Latch Electrophysiologist: Joycelyn Schmid Pacing: 97.7%         10/19/2020 Weight:  177 lbs 11/15/2020 Weight: 175 lbs                                                          Spoke with husband per DPR.  Pt is feeling well.  Discussed diet and she does like to eat pickles and drink pickle juice which she is aware is high in salt.   Advised to limit salt intake.     Optivol thoracic impedance suggesting fluid levels have improved since Furosemide increased to 40 mg daily.  Still suggesting possible ongoing fluid accumulation starting 9/26.   Optivol Fluid Index > normal threshold starting 10/11.   Prescribed:  Furosemide 20 mg take 2 tablets (40 mg total) daily.   Spironolactone 25 mg take 1 tablet at bedtime.   Labs: BMET scheduled 11/9 10/27/2020 Creatinine 1.19, BUN 11, Potassium 3.5, Sodium 139, GFR 49 10/11/2020 Creatinine 1.83, BUN 38, Potassium 5.2, Sodium 135, GFR 29 08/17/2020 Creatinine 1.12, BUN 14, Potassium 3.9, Sodium 138, GFR 53 04/23/2020 Creatinine 1.20, BUN 18, Potassium 4.0, Sodium 136, GFR 48 A complete set of results can be found in Results Review.   Recommendations:  Copy sent to Dr Shirlee Latch for review after Furosemide dosage was increased to 40 mg daily.   Follow-up plan: ICM clinic phone appointment on 12/01/2020 to recheck fluid levels.   91 day device clinic remote transmission 01/04/2021.     EP/Cardiology Office Visits:  01/11/2021 with [facility] PA/NP.  Recall 12/15/2020 with Dr Graciela Husbands    Copy of ICM check sent to Dr. Graciela Husbands.      3 month ICM trend: 11/22/2020.    1 Year ICM trend:       Karie Soda, RN 11/23/2020 9:17 AM

## 2020-11-24 ENCOUNTER — Ambulatory Visit (HOSPITAL_COMMUNITY)
Admission: RE | Admit: 2020-11-24 | Discharge: 2020-11-24 | Disposition: A | Discharge status: Home / Self Care | Payer: Medicare Other | Source: Ambulatory Visit | Attending: Cardiology | Admitting: Cardiology

## 2020-11-24 ENCOUNTER — Encounter (HOSPITAL_COMMUNITY): Payer: Self-pay

## 2020-11-24 ENCOUNTER — Other Ambulatory Visit (HOSPITAL_COMMUNITY): Payer: Self-pay

## 2020-11-24 DIAGNOSIS — I5022 Chronic systolic (congestive) heart failure: Secondary | ICD-10-CM

## 2020-11-24 NOTE — Progress Notes (Signed)
Attempted call to patient and no answer or voice mail option.

## 2020-11-24 NOTE — Progress Notes (Signed)
Can increase Lasix to 40 qam/20 qpm, BMET 1 week.

## 2020-11-24 NOTE — Progress Notes (Signed)
Attempted call to patient to advise of Dr Alford Highland recommendations to:  increase Lasix to 40 qam/20 qpm.  BMET 1 week.   No answer and no voice mail set up.

## 2020-11-26 MED ORDER — FUROSEMIDE 20 MG PO TABS: ORAL_TABLET | ORAL | 3 refills | Status: DC

## 2020-11-26 NOTE — Progress Notes (Signed)
Spoke with patient and advised of Dr Kathlyn Sacramento recommendations to  Increase Furosemide 20 mg to 2 tablets (40 mg total) every morning and 1 tablet (20 mg total) every evening.   Need BMET in a week. She agreed to recommendations and verbalized understanding.  She is unsure of her schedule at this time and will call back to schedule BMET appointment.   Epic Lasix prescription updated and sent to [facility] pharmacy as requested.

## 2020-11-26 NOTE — Addendum Note (Signed)
Addended by: Karie Soda on: 11/26/2020 01:29 PM   Modules accepted: Orders

## 2020-11-29 NOTE — Progress Notes (Signed)
Spoke with patient and attempted to schedule BMET for 11/19 as ordered by Dr Shirlee Latch after increasing Furosemide.  Pt said she is going to PCP tomorrow and any labs will be sent to Dr Shirlee Latch and she hung up the phone.  Pt declined to make appointment for BMET as ordered by Dr Shirlee Latch.

## 2020-12-01 ENCOUNTER — Ambulatory Visit (INDEPENDENT_AMBULATORY_CARE_PROVIDER_SITE_OTHER): Payer: Medicare Other

## 2020-12-01 DIAGNOSIS — Z95810 Presence of automatic (implantable) cardiac defibrillator: Secondary | ICD-10-CM

## 2020-12-01 DIAGNOSIS — I5022 Chronic systolic (congestive) heart failure: Secondary | ICD-10-CM

## 2020-12-03 ENCOUNTER — Telehealth: Payer: Self-pay

## 2020-12-03 NOTE — Telephone Encounter (Signed)
Remote ICM transmission received.  Attempted call to patient regarding ICM remote transmission and no answer or voice mail option.  

## 2020-12-03 NOTE — Progress Notes (Signed)
EPIC Encounter for ICM Monitoring  Patient Name: [name] is a 70 y.o. female Date: 12/03/2020 Primary Care Physican: Karl Ito, DO Primary Cardiologist: Shirlee Latch Electrophysiologist: Joycelyn Schmid Pacing: 97.7%         10/19/2020 Weight:  177 lbs 11/15/2020 Weight: 175 lbs                                                          Attempted call to patient and unable to reach.  Transmission reviewed.    Optivol thoracic impedance suggesting fluid levels returned to normal after taking increased Furosemide dosage of 40mg /20mg .   Optivol Fluid Index > normal threshold starting 10/11.   Prescribed:  Furosemide 20 mg take 2 tablets (40 mg total) by mouth every morning and 1 tablet every evening (increased 11/11).   Spironolactone 25 mg take 1 tablet at bedtime.   Labs: Pt declined BMET as ordered by Dr 13/11 on 11/11 and planned on having PCP send lab results at 11/15 appointment.  10/27/2020 Creatinine 1.19, BUN 11, Potassium 3.5, Sodium 139, GFR 49 10/11/2020 Creatinine 1.83, BUN 38, Potassium 5.2, Sodium 135, GFR 29 08/17/2020 Creatinine 1.12, BUN 14, Potassium 3.9, Sodium 138, GFR 53 04/23/2020 Creatinine 1.20, BUN 18, Potassium 4.0, Sodium 136, GFR 48 A complete set of results can be found in Results Review.   Recommendations:  Unable to reach.     Follow-up plan: ICM clinic phone appointment on 12/27/2020.   91 day device clinic remote transmission 01/04/2021.     EP/Cardiology Office Visits:  01/11/2021 with [facility] PA/NP.  Recall 12/15/2020 with Dr 12/17/2020    Copy of ICM check sent to Dr. Graciela Husbands.   3 month ICM trend: 12/02/2020.    12-14 Month ICM trend:       12/04/2020, RN 12/03/2020 10:40 AM

## 2020-12-21 ENCOUNTER — Ambulatory Visit: Payer: Medicare Other | Admitting: Internal Medicine

## 2020-12-27 ENCOUNTER — Ambulatory Visit (INDEPENDENT_AMBULATORY_CARE_PROVIDER_SITE_OTHER): Payer: Medicare Other

## 2020-12-27 DIAGNOSIS — Z95810 Presence of automatic (implantable) cardiac defibrillator: Secondary | ICD-10-CM | POA: Diagnosis not present

## 2020-12-27 DIAGNOSIS — I5022 Chronic systolic (congestive) heart failure: Secondary | ICD-10-CM

## 2020-12-30 ENCOUNTER — Telehealth: Payer: Self-pay

## 2020-12-30 NOTE — Telephone Encounter (Signed)
The patient agreed to send missed ICM transmission today. °

## 2020-12-31 NOTE — Progress Notes (Signed)
EPIC Encounter for ICM Monitoring  Patient Name: [name] is a 70 y.o. female Date: 12/31/2020 Primary Care Physican: Karl Ito, DO Primary Cardiologist: Shirlee Latch Electrophysiologist: Joycelyn Schmid Pacing: 97.8%         10/19/2020 Weight:  177 lbs 11/15/2020 Weight: 175 lbs                                                          Transmission reviewed.    Optivol thoracic impedance suggesting possible fluid accumulation x 3-4 days alternating with return to baseline for 1-2 days   Prescribed:  Furosemide 20 mg take 2 tablets (40 mg total) by mouth every morning and 1 tablet every evening (increased 11/11).   Spironolactone 25 mg take 1 tablet at bedtime.   Labs:  10/27/2020 Creatinine 1.19, BUN 11, Potassium 3.5, Sodium 139, GFR 49 10/11/2020 Creatinine 1.83, BUN 38, Potassium 5.2, Sodium 135, GFR 29 08/17/2020 Creatinine 1.12, BUN 14, Potassium 3.9, Sodium 138, GFR 53 04/23/2020 Creatinine 1.20, BUN 18, Potassium 4.0, Sodium 136, GFR 48 A complete set of results can be found in Results Review.   Recommendations:  Unable to reach.     Follow-up plan: ICM clinic phone appointment on 01/31/2021.   91 day device clinic remote transmission 01/04/2021.     EP/Cardiology Office Visits:  01/11/2021 with [facility] Clinic PA/NP.  Recall 12/15/2020 with Dr Graciela Husbands    Copy of ICM check sent to Dr. Graciela Husbands.   3 month ICM trend: 12/27/2020.    12-14 Month ICM trend:       Karie Soda, RN 12/31/2020 4:54 PM

## 2021-01-04 ENCOUNTER — Encounter: Payer: Self-pay | Admitting: General Practice

## 2021-01-04 ENCOUNTER — Ambulatory Visit (INDEPENDENT_AMBULATORY_CARE_PROVIDER_SITE_OTHER): Payer: Medicare Other

## 2021-01-04 DIAGNOSIS — I428 Other cardiomyopathies: Secondary | ICD-10-CM | POA: Diagnosis not present

## 2021-01-05 LAB — CUP PACEART REMOTE DEVICE CHECK
Battery Remaining Longevity: 15 mo
Battery Voltage: 2.89 V
Brady Statistic AP VP Percent: 0.02 %
Brady Statistic AP VS Percent: 0 %
Brady Statistic AS VP Percent: 98.39 %
Brady Statistic AS VS Percent: 1.59 %
Brady Statistic RA Percent Paced: 0.02 %
Brady Statistic RV Percent Paced: 0.03 %
Date Time Interrogation Session: 20221221132153
HighPow Impedance: 75 Ohm
Implantable Lead Implant Date: 20151021
Implantable Lead Implant Date: 20151021
Implantable Lead Implant Date: 20151021
Implantable Lead Location: 753858
Implantable Lead Location: 753859
Implantable Lead Location: 753860
Implantable Lead Model: 4298
Implantable Lead Model: 5076
Implantable Pulse Generator Implant Date: 20151021
Lead Channel Impedance Value: 1007 Ohm
Lead Channel Impedance Value: 1026 Ohm
Lead Channel Impedance Value: 1064 Ohm
Lead Channel Impedance Value: 456 Ohm
Lead Channel Impedance Value: 475 Ohm
Lead Channel Impedance Value: 513 Ohm
Lead Channel Impedance Value: 532 Ohm
Lead Channel Impedance Value: 608 Ohm
Lead Channel Impedance Value: 608 Ohm
Lead Channel Impedance Value: 608 Ohm
Lead Channel Impedance Value: 722 Ohm
Lead Channel Impedance Value: 988 Ohm
Lead Channel Impedance Value: 988 Ohm
Lead Channel Pacing Threshold Amplitude: 0.5 V
Lead Channel Pacing Threshold Amplitude: 0.625 V
Lead Channel Pacing Threshold Amplitude: 1.125 V
Lead Channel Pacing Threshold Pulse Width: 0.4 ms
Lead Channel Pacing Threshold Pulse Width: 0.4 ms
Lead Channel Pacing Threshold Pulse Width: 0.5 ms
Lead Channel Sensing Intrinsic Amplitude: 4 mV
Lead Channel Sensing Intrinsic Amplitude: 4 mV
Lead Channel Sensing Intrinsic Amplitude: 7.625 mV
Lead Channel Sensing Intrinsic Amplitude: 7.625 mV
Lead Channel Setting Pacing Amplitude: 1.25 V
Lead Channel Setting Pacing Amplitude: 1.5 V
Lead Channel Setting Pacing Amplitude: 2 V
Lead Channel Setting Pacing Pulse Width: 0.4 ms
Lead Channel Setting Pacing Pulse Width: 1 ms
Lead Channel Setting Sensing Sensitivity: 0.3 mV

## 2021-01-07 NOTE — Progress Notes (Signed)
Patient ID: [name], female   DOB: 11-May-1950, 70 y.o.   MRN: [id number] PCP: Karl Ito, DO EP: Dr Graciela Husbands.  Cardiologist: Dr Shirlee Latch  Neurologist: Dr Pearlean Brownie  HPI: [name] is a 70 y.o. female with chronic systolic CHF with nonischemic cardiomypathy (no significant CAD) s/p Medtronic CRT-D, LBBB, mild-mod MR and CVA (09/2013).  Coronary angiography in 7/15 showed no significant CAD.  Admitted in 9/15 with right sided facial droop and expressive aphasia, CVA on MRI. Had TEE showing EF 15%.    QRS narrowed, so in 11/16 device was reprogrammed to VVI.   Echo in 12/17 showed EF up to 45-50%.  Echo in 3/19 showed EF 45-50% with moderate LVH and normal RV.   Echo was done in 4/21 showing EF decreased to 30-35% with LAD territory wall motion abnormalities.  LHC/RHC in 5/21 showed normal filling pressures and cardiac output, no significant CAD.  With persistent LBBB, CRT was activated by Dr. Graciela Husbands in 9/21.   SCr up to 1.8 9/22, Lasix stopped. Lasix slowly added back as ICM monitoring showed fluid accumulation.   Today she returns for HF follow up with her husband. Walks 0.5-1 mile/day and has no dyspnea with this. Continues with mild fatigue and foot/ankle OA. Does not go up stairs due to knee OA. Overall feeling fine. Has some dizziness due to left inner ear issues, but no falls. Denies abnormal bleeding, palpitations, CP, edema, or PND/Orthopnea. Appetite ok. No fever or chills. Weight at home 175 pounds. Taking all medications. She has been referred for ANTHEM, however BNP did not meet requirement. She has been drinking a lot of fluids and drinking pickle juice.  Device interrogation (personally reviewed): OptiVol well above threshold since 10/26/20, however thoracic impedence at reference range, ~2 hours day/activity, no AF/AT, >99% V-pacing, no VT.  ReDs: 28%  Labs (2/21): K 3.8, creatinine 0.96, LDL 105  Labs (9/21): LDL 92 Labs (12/21): K 3.7, creatinine 0.87 Labs (4/22): LDL  66 Labs (8/22): K 3.9, creatinine 1.12 Labs (10/22): K 3.5, creatinine 1.19  PMH: 1. Nonischemic cardiomyopathy: Echo (7/15) with severe LV dilation, EF 25% with diffuse hypokinesis, mild to moderate MR.  RHC/ LHC 08/08/13 with RA 4, PA 23/8, PCWP 11, CI 2.52; no CAD.  TEE (9/15) with EF 15-20%, severe global hypokinesis, no LV thrombus, moderate MR, normal RV.  Echo (10/2013): EF 25-30%, grade I DD, mild AI. Medtronic CRT-D (10/15).  Echo (2/16) with EF 25-30%, diffuse hypokinesis worse in the septum, grade I diastolic dysfunction, normal RV size and systolic function, mild-moderate MR.  - Echo (8/16) with EF 35-40%, diffuse hypokinesis, mild-moderate mitral regurgitation.  - Echo (12/17): EF 45-50%, mild LVH, mild to moderate MR.  - Echo (3/19): EF 45-50% with moderate LVH, mild MR, normal RV size and systolic function.  - Echo (4/21): EF 30-35% with LAD territory wall motion abnormalities. - LHC/RHC (5/21): No significant CAD; mean RA 6, PA 31/13, mean PCWP 13, CI 3.37.  2. CVA: 9/15 with dysarthria.  Carotids 9/15 with no significant CAD.  - Carotid dopplers (10/17): mild plaque, normal subclavians.  3. LBBB: Medtronic CRT-D (10/2013) 4. Hyperlipidemia 5. Cholelithiasis 6. Peripheral arterial dopplers (6/19): Normal  SH: lives with her son and husband. Does not drink or smoke. Currently not working.   FH: Mom had A fib and Heart Failure. Deceased 77        Sister has lupus  ROS: All systems negative except as listed in HPI, PMH and Problem List.  Current  Outpatient Medications  Medication Sig Dispense Refill   albuterol (VENTOLIN HFA) 108 (90 Base) MCG/ACT inhaler Inhale 2 puffs into the lungs at bedtime as needed for wheezing or shortness of breath. 18 g 3   atorvastatin (LIPITOR) 80 MG tablet Take 1 tablet (80 mg total) by mouth daily at 6 PM. 90 tablet 3   carvedilol (COREG) 6.25 MG tablet Take 1 tablet (6.25 mg total) by mouth 2 (two) times daily with a meal. 180 tablet 3    cetirizine (ZYRTEC) 10 MG tablet Take 10 mg by mouth daily.     clopidogrel (PLAVIX) 75 MG tablet Take 1 tablet (75 mg total) by mouth daily. 90 tablet 3   dapagliflozin propanediol (FARXIGA) 10 MG TABS tablet Take 1 tablet (10 mg total) by mouth daily before breakfast. 90 tablet 3   fluticasone (FLONASE) 50 MCG/ACT nasal spray Place 2 sprays into both nostrils daily. 16 g 0   furosemide (LASIX) 20 MG tablet Take 2 tablets (40 mg total) by mouth every morning AND 1 tablet (20 mg total) every evening. 270 tablet 3   pantoprazole (PROTONIX) 40 MG tablet Take 40 mg by mouth daily.      sacubitril-valsartan (ENTRESTO) 49-51 MG Take 1 tablet by mouth 2 (two) times daily. 180 tablet 3   spironolactone (ALDACTONE) 25 MG tablet Take 1 tablet (25 mg total) by mouth at bedtime. 90 tablet 3   No current facility-administered medications for this encounter.   BP 100/76    Pulse 62    Wt 79.8 kg (176 lb)    SpO2 97%    BMI 30.21 kg/m   Wt Readings from Last 3 Encounters:  01/11/21 79.8 kg (176 lb)  10/11/20 80.2 kg (176 lb 12.8 oz)  06/18/20 80.3 kg (177 lb)   PHYSICAL EXAM: General:  NAD. No resp difficulty, walked into clinic with cane HEENT: Normal Neck: Supple. JVP 7-8. Carotids 2+ bilat; no bruits. No lymphadenopathy or thryomegaly appreciated. Cor: PMI nondisplaced. Regular rate & rhythm. No rubs, gallops or murmurs. Lungs: Clear Abdomen: Soft, nontender, nondistended. No hepatosplenomegaly. No bruits or masses. Good bowel sounds. Extremities: No cyanosis, clubbing, rash, edema Neuro: Alert & oriented x 3, cranial nerves grossly intact. Moves all 4 extremities w/o difficulty. Affect pleasant. Mild dysarthria  ASSESSMENT & PLAN: 1. Chronic Systolic Heart Failure: Nonischemic cardiomyopathy s/p Medtronic CRT-D in 10/15, coronary angiography without CAD in 07/2013. Echo done in 2/16, EF 25-30%.  Repeat echo in 8/16 with EF up to 35-40%.  Echo in 4/19 showed EF up to 45-50%.  However, echo in 4/21  showed EF back down to 30-35%.  L/RHC in 5/21 showed no CAD, normal filling pressures, preserved cardiac output.  Of note, Medtronic device had been reprogrammed to VVI as LBBB has been only intermittent => more recently, LBBB has been persistent and CRT was reactivated in 9/21. NYHA class II, primarily due to fatigue and OA. She is not markedly volume overloaded by exam, or ReDs 28%. OptiVol shows fluid up since 10/26/20, but thoracic impedence stable. JVP mildly elevated.    - Increase Lasix to 40 mg bid + add 20 mEq of KCl daily. BMET/BNP today, repeat BMET in 10 days. - Continue Coreg 6.25 mg bid, she did not tolerate increase to 9.375 mg bid.   - Continue Entresto 49/51 bid, no BP room to increase.   - Continue spironolactone 25 mg daily.  - Continue dapagliflozin 10 mg daily.   - She was evaluated for ANTHEM study, however  BNP did not meet criteria.  - Arrange for repeat Echo. 2. CVA: ?Embolic.  No atrial fibrillation has been detected so far on device.  She will continue Plavix for now. If future device interrogation shows atrial fibrillation, she will need to be anticoagulated.  None on device interrogation today. 3. Hyperlipidemia: Goal LDL < 70 with history of CVA. Lipids ok 4/22.  Followup in 3 months with Dr. Shirlee Latch + echo    Anderson Malta [facility] FNP-BC 01/11/2021

## 2021-01-11 ENCOUNTER — Ambulatory Visit (HOSPITAL_COMMUNITY)
Admission: RE | Admit: 2021-01-11 | Discharge: 2021-01-11 | Disposition: A | Discharge status: Home / Self Care | Payer: Medicare Other | Source: Ambulatory Visit | Attending: Family Medicine | Admitting: Family Medicine

## 2021-01-11 ENCOUNTER — Other Ambulatory Visit: Payer: Self-pay

## 2021-01-11 ENCOUNTER — Encounter (HOSPITAL_COMMUNITY): Payer: Self-pay

## 2021-01-11 VITALS — BP 100/76 | HR 62 | Wt 176.0 lb

## 2021-01-11 DIAGNOSIS — E785 Hyperlipidemia, unspecified: Secondary | ICD-10-CM

## 2021-01-11 DIAGNOSIS — I447 Left bundle-branch block, unspecified: Secondary | ICD-10-CM | POA: Insufficient documentation

## 2021-01-11 DIAGNOSIS — Z79899 Other long term (current) drug therapy: Secondary | ICD-10-CM | POA: Insufficient documentation

## 2021-01-11 DIAGNOSIS — Z8673 Personal history of transient ischemic attack (TIA), and cerebral infarction without residual deficits: Secondary | ICD-10-CM | POA: Insufficient documentation

## 2021-01-11 DIAGNOSIS — I6340 Cerebral infarction due to embolism of unspecified cerebral artery: Secondary | ICD-10-CM | POA: Diagnosis not present

## 2021-01-11 DIAGNOSIS — Z7902 Long term (current) use of antithrombotics/antiplatelets: Secondary | ICD-10-CM | POA: Diagnosis not present

## 2021-01-11 DIAGNOSIS — I5020 Unspecified systolic (congestive) heart failure: Secondary | ICD-10-CM | POA: Diagnosis not present

## 2021-01-11 DIAGNOSIS — Z95810 Presence of automatic (implantable) cardiac defibrillator: Secondary | ICD-10-CM

## 2021-01-11 DIAGNOSIS — M179 Osteoarthritis of knee, unspecified: Secondary | ICD-10-CM | POA: Insufficient documentation

## 2021-01-11 DIAGNOSIS — M19079 Primary osteoarthritis, unspecified ankle and foot: Secondary | ICD-10-CM | POA: Diagnosis not present

## 2021-01-11 DIAGNOSIS — I5022 Chronic systolic (congestive) heart failure: Secondary | ICD-10-CM | POA: Diagnosis present

## 2021-01-11 LAB — BASIC METABOLIC PANEL
Anion gap: 10 (ref 5–15)
BUN: 11 mg/dL (ref 8–23)
CO2: 26 mmol/L (ref 22–32)
Calcium: 9.2 mg/dL (ref 8.9–10.3)
Chloride: 103 mmol/L (ref 98–111)
Creatinine, Ser: 1.02 mg/dL — ABNORMAL HIGH (ref 0.44–1.00)
GFR, Estimated: 58 mL/min — ABNORMAL LOW (ref >60)
Glucose, Bld: 102 mg/dL — ABNORMAL HIGH (ref 70–99)
Potassium: 3.2 mmol/L — ABNORMAL LOW (ref 3.5–5.1)
Sodium: 139 mmol/L (ref 135–145)

## 2021-01-11 LAB — BRAIN NATRIURETIC PEPTIDE: B Natriuretic Peptide: 22.1 pg/mL (ref 0.0–100.0)

## 2021-01-11 MED ORDER — CARVEDILOL 6.25 MG PO TABS
6.2500 mg | ORAL_TABLET | Freq: Two times a day (BID) | ORAL | 3 refills | Status: DC
Start: 2021-01-11 — End: 2021-04-27

## 2021-01-11 MED ORDER — POTASSIUM CHLORIDE CRYS ER 20 MEQ PO TBCR: 20.0000 meq | EXTENDED_RELEASE_TABLET | Freq: Every day | ORAL | 3 refills | Status: DC

## 2021-01-11 MED ORDER — FUROSEMIDE 40 MG PO TABS: 40.0000 mg | ORAL_TABLET | Freq: Two times a day (BID) | ORAL | 3 refills | Status: DC

## 2021-01-11 NOTE — Progress Notes (Signed)
ReDS Vest / Clip - 01/11/21 1000       ReDS Vest / Clip   Station Marker A    Ruler Value 29    ReDS Value Range Low volume    ReDS Actual Value 28

## 2021-01-11 NOTE — Patient Instructions (Signed)
INCREASE Lasix to 40 mg twice a day START Potassium 20 meq one tab daily   Labs today We will only contact you if something comes back abnormal or we need to make some changes. Otherwise no news is good news!  Labs needed in 7-10 days  Your physician recommends that you schedule a follow-up appointment in: 3 months with Dr Shirlee Latch and echo  Your physician has requested that you have an echocardiogram. Echocardiography is a painless test that uses sound waves to create images of your heart. It provides your doctor with information about the size and shape of your heart and how well your hearts chambers and valves are working. This procedure takes approximately one hour. There are no restrictions for this procedure.   Do the following things EVERYDAY: Weigh yourself in the morning before breakfast. Write it down and keep it in a log. Take your medicines as prescribed Eat low salt foods--Limit salt (sodium) to 2000 mg per day.  Stay as active as you can everyday Limit all fluids for the day to less than 2 liters (68 ounces)

## 2021-01-12 ENCOUNTER — Telehealth (HOSPITAL_COMMUNITY): Payer: Self-pay | Admitting: Surgery

## 2021-01-12 DIAGNOSIS — I5020 Unspecified systolic (congestive) heart failure: Secondary | ICD-10-CM

## 2021-01-12 NOTE — Telephone Encounter (Signed)
I called and spoke with patient reagrding lab results and recommendations per Prince Rome NP.  She is now agreeable to come Jan 4th at [facility] for repeat labwork.

## 2021-01-12 NOTE — Telephone Encounter (Signed)
-----   Message from Jacklynn Ganong, [place] sent at 01/12/2021 10:42 AM EST ----- Needs lab appt sooner, K is too low to wait almost 3 weeks for recheck.

## 2021-01-13 NOTE — Progress Notes (Signed)
Remote ICD transmission.   

## 2021-01-19 ENCOUNTER — Ambulatory Visit (HOSPITAL_COMMUNITY)
Admission: RE | Admit: 2021-01-19 | Discharge: 2021-01-19 | Disposition: A | Discharge status: Home / Self Care | Payer: Medicare Other | Source: Ambulatory Visit | Attending: Cardiology | Admitting: Cardiology

## 2021-01-19 ENCOUNTER — Other Ambulatory Visit: Payer: Self-pay

## 2021-01-19 DIAGNOSIS — I5020 Unspecified systolic (congestive) heart failure: Secondary | ICD-10-CM | POA: Insufficient documentation

## 2021-01-19 LAB — BASIC METABOLIC PANEL
Anion gap: 8 (ref 5–15)
BUN: 10 mg/dL (ref 8–23)
CO2: 25 mmol/L (ref 22–32)
Calcium: 9.2 mg/dL (ref 8.9–10.3)
Chloride: 103 mmol/L (ref 98–111)
Creatinine, Ser: 1.04 mg/dL — ABNORMAL HIGH (ref 0.44–1.00)
GFR, Estimated: 57 mL/min — ABNORMAL LOW (ref >60)
Glucose, Bld: 132 mg/dL — ABNORMAL HIGH (ref 70–99)
Potassium: 3.4 mmol/L — ABNORMAL LOW (ref 3.5–5.1)
Sodium: 136 mmol/L (ref 135–145)

## 2021-01-21 ENCOUNTER — Telehealth (HOSPITAL_COMMUNITY): Payer: Self-pay | Admitting: Surgery

## 2021-01-21 NOTE — Telephone Encounter (Signed)
-----   Message from Jacklynn Ganong, [place] sent at 01/19/2021 10:30 AM EST ----- K better, but still low. Has she been taking KCL 40 mEq daily?  If so, increase dietary K and take extra 40 KCL today (total 40 bid for today) and increase to 60 mEq KCL daily starting tomorrow. Will recheck at next lab appt 01/26/21.  Please call lab and see if they can add mag to her labs drawn today.

## 2021-01-21 NOTE — Telephone Encounter (Signed)
I attempted to reach patient and left a message for a return call.  

## 2021-01-26 ENCOUNTER — Other Ambulatory Visit: Payer: Self-pay

## 2021-01-26 ENCOUNTER — Ambulatory Visit (HOSPITAL_COMMUNITY)
Admission: RE | Admit: 2021-01-26 | Discharge: 2021-01-26 | Disposition: A | Discharge status: Home / Self Care | Payer: Medicare Other | Source: Ambulatory Visit | Attending: Cardiology | Admitting: Cardiology

## 2021-01-26 DIAGNOSIS — I5020 Unspecified systolic (congestive) heart failure: Secondary | ICD-10-CM | POA: Diagnosis not present

## 2021-01-26 LAB — BASIC METABOLIC PANEL
Anion gap: 11 (ref 5–15)
BUN: 16 mg/dL (ref 8–23)
CO2: 23 mmol/L (ref 22–32)
Calcium: 9.2 mg/dL (ref 8.9–10.3)
Chloride: 99 mmol/L (ref 98–111)
Creatinine, Ser: 1.14 mg/dL — ABNORMAL HIGH (ref 0.44–1.00)
GFR, Estimated: 51 mL/min — ABNORMAL LOW (ref >60)
Glucose, Bld: 84 mg/dL (ref 70–99)
Potassium: 4 mmol/L (ref 3.5–5.1)
Sodium: 133 mmol/L — ABNORMAL LOW (ref 135–145)

## 2021-01-26 NOTE — Addendum Note (Signed)
Encounter addended by: Theresia Bough, CMA on: 01/26/2021 9:34 AM  Actions taken: Charge Capture section accepted

## 2021-01-31 ENCOUNTER — Ambulatory Visit (INDEPENDENT_AMBULATORY_CARE_PROVIDER_SITE_OTHER): Payer: Medicare Other

## 2021-01-31 DIAGNOSIS — Z95810 Presence of automatic (implantable) cardiac defibrillator: Secondary | ICD-10-CM | POA: Diagnosis not present

## 2021-01-31 DIAGNOSIS — I5020 Unspecified systolic (congestive) heart failure: Secondary | ICD-10-CM | POA: Diagnosis not present

## 2021-02-02 NOTE — Progress Notes (Signed)
EPIC Encounter for ICM Monitoring  Patient Name: [name] is a 71 y.o. female Date: 02/02/2021 Primary Care Physican: Andree Moro, DO Primary Cardiologist: Aundra Dubin Electrophysiologist: Vergie Living Pacing: 97.9%         01/11/2021 Office Weight: 176 lbs                                                          Transmission reviewed.    Optivol thoracic impedance suggesting possible fluid accumulation x 3-4 days alternating with return to baseline for 1-2 days   Prescribed:  Furosemide 40 mg take 1 tablet (40 mg total) by mouth two times daily.  Potassium 20 mEq take 1 tablet daily  Spironolactone 25 mg take 1 tablet at bedtime.   Labs: 01/26/2021 Creatinine 1.14, BUN 16, Potassium 4.0, Sodium 133, GFR 51 01/19/2021 Creatinine 1.04, BUN 10, Potassium 3.4, Sodium 136, GFR 57  01/11/2021 Creatinine 1.02, BUN 11, Potassium 3.2, Sodium 139, GFR 58  10/27/2020 Creatinine 1.19, BUN 11, Potassium 3.5, Sodium 139, GFR 49 10/11/2020 Creatinine 1.83, BUN 38, Potassium 5.2, Sodium 135, GFR 29 08/17/2020 Creatinine 1.12, BUN 14, Potassium 3.9, Sodium 138, GFR 53 04/23/2020 Creatinine 1.20, BUN 18, Potassium 4.0, Sodium 136, GFR 48 A complete set of results can be found in Results Review.   Recommendations:  No changes.     Follow-up plan: ICM clinic phone appointment on 03/07/2021.   91 day device clinic remote transmission 04/05/2021.   EP/Cardiology Office Visits:  04/11/2021 with Dr Aundra Dubin.  Recall 12/15/2020 with Dr Caryl Comes    Copy of ICM check sent to Dr. Caryl Comes.   3 month ICM trend: 01/31/2021.    12-14 Month ICM trend:     Rosalene Billings, RN 02/02/2021 5:13 PM

## 2021-02-22 ENCOUNTER — Ambulatory Visit: Payer: Medicare Other | Attending: Internal Medicine | Admitting: Internal Medicine

## 2021-02-22 ENCOUNTER — Other Ambulatory Visit: Payer: Self-pay

## 2021-02-22 ENCOUNTER — Encounter: Payer: Self-pay | Admitting: Internal Medicine

## 2021-02-22 VITALS — BP 122/73 | HR 54 | Resp 16 | Ht 64.0 in | Wt 172.6 lb

## 2021-02-22 DIAGNOSIS — I6930 Unspecified sequelae of cerebral infarction: Secondary | ICD-10-CM

## 2021-02-22 DIAGNOSIS — N1831 Chronic kidney disease, stage 3a: Secondary | ICD-10-CM | POA: Diagnosis not present

## 2021-02-22 DIAGNOSIS — Z5320 Procedure and treatment not carried out because of patient's decision for unspecified reasons: Secondary | ICD-10-CM

## 2021-02-22 DIAGNOSIS — Z7689 Persons encountering health services in other specified circumstances: Secondary | ICD-10-CM

## 2021-02-22 DIAGNOSIS — I428 Other cardiomyopathies: Secondary | ICD-10-CM

## 2021-02-22 DIAGNOSIS — R197 Diarrhea, unspecified: Secondary | ICD-10-CM

## 2021-02-22 DIAGNOSIS — I5022 Chronic systolic (congestive) heart failure: Secondary | ICD-10-CM

## 2021-02-22 DIAGNOSIS — E663 Overweight: Secondary | ICD-10-CM

## 2021-02-22 DIAGNOSIS — R142 Eructation: Secondary | ICD-10-CM

## 2021-02-22 DIAGNOSIS — R634 Abnormal weight loss: Secondary | ICD-10-CM | POA: Insufficient documentation

## 2021-02-22 DIAGNOSIS — E785 Hyperlipidemia, unspecified: Secondary | ICD-10-CM

## 2021-02-22 HISTORY — DX: Chronic kidney disease, stage 3a: N18.31

## 2021-02-22 NOTE — Progress Notes (Addendum)
Patient ID: [name], female    DOB: 12-Oct-1950  MRN: [id number]  CC: New Patient (Initial Visit)   Subjective: [name] is a 71 y.o. female who presents for new pt visit Her concerns today include:  Patient with history of systolic CHF EF 30 to 35% (04/2019), NICM with ICD/pacemaker, CKD 3, HL,  Embolic CVA (2015x2), GERD, bronchitis  Previous PCP was at [facility] in [place].  Decided to change because all of her other doctors are through [facility] and husband is my patient.  HM:  last MMG was almost 2 yrs ago.  Decline MMG order at this time stating that she hates having it done..  Had flu, COVID and Pneumonia vaccines at [facility].  Thinks she had shingrix.  Hx of shingles.    CHF/NICM No firing of ICD. Cardiologist is Dr. Jearld Pies Currently taking: see medication list Med Adherence:  Yes     No Medication side effects:  Yes     No Adherence with salt restriction:  Yes   Wghs self every morning.  Last wgh 170 lbs.  In reviewing her records, weight was 187 in December 2021. SOB?  Yes     No Chest Pain?:  Yes     No Leg swelling?:  Yes     No Headaches?:  Yes     No Dizziness?  Yes if water gets in ear when showering.  Wears ear plugs Comments:   HL/CVA:  compliant with Plavix and Lipitor. Has residual balance issues especially when she looks up and looks down.  Uses cane sometimes.  No falls.  CKD 3: pt states she is aware of CKD by her previous primary.  Not on NSAID.  Not under care of nephrologist  Patient is overweight based on BMI.  She tells me she has loss wgh from size 16 to size 12in clothing size over past 1-2 yrs.  Intentional wgh loss.  "Dr. Jearld Pies wants me down to 150 lbs."  Eating smaller portions.  She has cut back on a lot of the white stuff.  However she drinks regular sodas and OJ.  She eats only yogurt for breakfast. Walks for 10-15 mins daily with her husband when weather is good.   Had negative cologuard  test.  Last was 1.5 mths ago.  No problems swallowing.  Reports intermittent burping and gas at times.  No blood in the urine.  She has had mild diarrhea for the past 2 to 3 days.  She attributes this to eating a spoiled yogurt 3 days ago.  She has had 1 loose bowel movement a day since then with some increased burping and bloating.  No vomiting.  Lives with husband and their son Patient Active Problem List   Diagnosis Date Noted   Chronic cough 09/16/2018   Heart failure with reduced ejection fraction (HCC)- s/p ICD and pacemaker  10/19/2017   Allergic rhinitis 11/09/2014   Cerebral infarction due to embolism of cerebral artery (HCC) 12/03/2013   GERD (gastroesophageal reflux disease)    Non-ischemic cardiomyopathy (HCC) 08/11/2013   Mitral regurgitation    Healthcare maintenance 04/09/2013     Current Outpatient Medications on File Prior to Visit  Medication Sig Dispense Refill   albuterol (VENTOLIN HFA) 108 (90 Base) MCG/ACT inhaler Inhale 2 puffs into the lungs at bedtime as needed for wheezing or shortness of breath. 18 g 3   atorvastatin (LIPITOR) 80 MG tablet Take 1 tablet (80 mg total) by mouth daily  at 6 PM. 90 tablet 3   carvedilol (COREG) 6.25 MG tablet Take 1 tablet (6.25 mg total) by mouth 2 (two) times daily with a meal. 180 tablet 3   cetirizine (ZYRTEC) 10 MG tablet Take 10 mg by mouth daily.     clopidogrel (PLAVIX) 75 MG tablet Take 1 tablet (75 mg total) by mouth daily. 90 tablet 3   dapagliflozin propanediol (FARXIGA) 10 MG TABS tablet Take 1 tablet (10 mg total) by mouth daily before breakfast. 90 tablet 3   fluticasone (FLONASE) 50 MCG/ACT nasal spray Place 2 sprays into both nostrils daily. 16 g 0   furosemide (LASIX) 40 MG tablet Take 1 tablet (40 mg total) by mouth 2 (two) times daily. 180 tablet 3   pantoprazole (PROTONIX) 40 MG tablet Take 40 mg by mouth daily.      potassium chloride SA (KLOR-CON M) 20 MEQ tablet Take 1 tablet (20 mEq total) by mouth daily. 90  tablet 3   sacubitril-valsartan (ENTRESTO) 49-51 MG Take 1 tablet by mouth 2 (two) times daily. 180 tablet 3   spironolactone (ALDACTONE) 25 MG tablet Take 1 tablet (25 mg total) by mouth at bedtime. 90 tablet 3   No current facility-administered medications on file prior to visit.    Allergies  Allergen Reactions   Onion Other (See Comments)    gas   Tomato Other (See Comments)    reflux   Voltaren [Diclofenac] Rash    Social History   Socioeconomic History   Marital status: Married    Spouse name: Not on file   Number of children: 1   Years of education: 12   Highest education level: Not on file  Occupational History   Not on file  Tobacco Use   Smoking status: Never   Smokeless tobacco: Never  Vaping Use   Vaping Use: Never used  Substance and Sexual Activity   Alcohol use: No    Alcohol/week: 0.0 standard drinks   Drug use: No   Sexual activity: Yes  Other Topics Concern   Not on file  Social History Narrative   Patient is married with one child.   Patient is right handed.   Patient has hs education and 3 yrs of college.   Patient drinks 1 can daily.   Social Determinants of Health   Financial Resource Strain: Not on file  Food Insecurity: Not on file  Transportation Needs: Not on file  Physical Activity: Not on file  Stress: Not on file  Social Connections: Not on file  Intimate Partner Violence: Not on file    Family History  Problem Relation Age of Onset   Congestive Heart Failure Mother    Cancer Mother 74       Breast   Cancer Father 24       Oat Cell CA   Lupus Sister    Heart disease Brother     Past Surgical History:  Procedure Laterality Date   BI-VENTRICULAR IMPLANTABLE CARDIOVERTER DEFIBRILLATOR N/A 11/05/2013   Procedure: BI-VENTRICULAR IMPLANTABLE CARDIOVERTER DEFIBRILLATOR  (CRT-D);  Surgeon: Duke Salvia, MD;  Location[facility] CATH LAB;  Service: Cardiovascular;  Laterality: N/A;   BI-VENTRICULAR IMPLANTABLE CARDIOVERTER  DEFIBRILLATOR  (CRT-D)  11/05/2013   CARDIAC CATHETERIZATION  07/2013   KNEE ARTHROSCOPY Right    LEFT AND RIGHT HEART CATHETERIZATION WITH CORONARY ANGIOGRAM N/A 08/08/2013   Procedure: LEFT AND RIGHT HEART CATHETERIZATION WITH CORONARY ANGIOGRAM;  Surgeon: [name], MD;  Location: [facility] CATH LAB;  Service:  Cardiovascular;  Laterality: N/A;   RIGHT/LEFT HEART CATH AND CORONARY ANGIOGRAPHY N/A 06/05/2019   Procedure: RIGHT/LEFT HEART CATH AND CORONARY ANGIOGRAPHY;  Surgeon: [name], MD;  Location: [facility] INVASIVE CV LAB;  Service: Cardiovascular;  Laterality: N/A;   TEE WITHOUT CARDIOVERSION N/A 09/26/2013   Procedure: TRANSESOPHAGEAL ECHOCARDIOGRAM (TEE);  Surgeon: [name], MD;  Location: [facility] ENDOSCOPY;  Service: Cardiovascular;  Laterality: N/A;   TONSILLECTOMY     WRIST ARTHROSCOPY  12/21/2010   Procedure: ARTHROSCOPY WRIST;  Surgeon: [name], MD;  Location: [facility];  Service: Orthopedics;  Laterality: Right;  right wrist, repair TFCC on right, open reconstruction ECU sheath    ROS: Review of Systems Negative except as stated above  PHYSICAL EXAM: BP 122/73   Pulse (!) 54   Resp 16   Ht 5\' 4"  (1.626 m)   Wt 172 lb 9.6 oz (78.3 kg)   SpO2 95%   BMI 29.63 kg/m   Wt Readings from Last 3 Encounters:  02/22/21 172 lb 9.6 oz (78.3 kg)  01/11/21 176 lb (79.8 kg)  10/11/20 176 lb 12.8 oz (80.2 kg)    Physical Exam  General appearance -elderly Caucasian female who appears frail. Mental status - normal mood, behavior, speech, dress, motor activity, and thought processes Eyes -slightly pale conjunctiva. Nose - normal and patent, no erythema, discharge or polyps Mouth - mucous membranes moist, pharynx normal without lesions Neck - supple, no significant adenopathy.  No thyroid enlargement Chest - clear to auscultation, no wheezes, rales or rhonchi, symmetric air entry Heart - normal rate, regular rhythm, normal S1, S2, no murmurs, rubs, clicks  or gallops Abdomen -abdomen is nondistended.  She has increased bowel sounds.  Soft, nontender. Extremities -no lower extremity edema. MSK: Patient has to go slow when she went from sitting to standing.  She also required standby assist with transfer to exam table.   CMP Latest Ref Rng & Units 01/26/2021 01/19/2021 01/11/2021  Glucose 70 - 99 mg/dL 84 01/13/2021) 163(W)  BUN 8 - 23 mg/dL 16 10 11   Creatinine 0.44 - 1.00 mg/dL 466(Z) ) 9.93(T)  Sodium 135 - 145 mmol/L 133(L) 136 139  Potassium 3.5 - 5.1 mmol/L 4.0 3.4(L) 3.2(L)  Chloride 98 - 111 mmol/L 99 103 103  CO2 22 - 32 mmol/L 23 25 26   Calcium 8.9 - 10.3 mg/dL 9.2 9.2 9.2  Total Protein 6.5 - 8.1 g/dL - - -  Total Bilirubin 0.3 - 1.2 mg/dL - - -  Alkaline Phos 38 - 126 U/L - - -  AST 15 - 41 U/L - - -  ALT 14 - 54 U/L - - -   Lipid Panel     Component Value Date/Time   CHOL 115 04/23/2020 1027   TRIG 77 04/23/2020 1027   HDL 34 (L) 04/23/2020 1027   CHOLHDL 3.4 04/23/2020 1027   VLDL 15 04/23/2020 1027   LDLCALC 66 04/23/2020 1027    CBC    Component Value Date/Time   WBC 9.2 06/05/2019 1054   RBC 4.20 06/05/2019 1054   HGB 10.2 (L) 06/05/2019 1307   HCT 30.0 (L) 06/05/2019 1307   PLT 257 06/05/2019 1054   MCV 90.5 06/05/2019 1054   MCH 28.8 06/05/2019 1054   MCHC 31.8 06/05/2019 1054   RDW 13.7 06/05/2019 1054   LYMPHSABS 1.8 11/03/2013 1006   MONOABS 0.5 11/03/2013 1006   EOSABS 0.3 11/03/2013 1006   BASOSABS 0.0 11/03/2013 1006    ASSESSMENT  AND PLAN:  1. Establishing care with new doctor, encounter for  2. Chronic systolic congestive heart failure (HCC) Stable and compensated.  Continue furosemide, spironolactone 25 mg daily, carvedilol 6.25 mg twice a day, Entresto 49/51 mg twice a day and [place]. Followed by cardiology. - CBC - Hepatic function panel  3. NICM (nonischemic cardiomyopathy) (HCC) See #2 above  4. Stage 3a chronic kidney disease (HCC) Advised to avoid over-the-counter NSAIDs.   Influenced by cardiac renal into play.  We will continue to monitor.   5. Acute diarrhea Recommend taking some Imodium as needed.  Hold Farxiga until diarrhea has stopped.  6. Burping She is on a PPI. Advised to try drinking a cold can of diet ginger ale  7. Weight loss Patient reports weight loss has been intentional.  She is also on furosemide 40 mg twice a day.  We will get her up-to-date with age-appropriate cancer screenings.  I will also check thyroid panel even though patient states the weight loss is intentional. - TSH+T4F+T3Free  8. Overweight (BMI 25.0-29.9) She is overweight for height despite the weight loss.  Commended her on weight loss so far.  Encourage healthy eating habits.  Encouraged her to getting a solid breakfast every morning instead of just yogurt.  Also encouraged her to cut back on the sugary drinks  9. Hyperlipidemia, unspecified hyperlipidemia type Continue atorvastatin - Hepatic function panel  10. History of CVA with residual deficit Continue atorvastatin and Plavix.  Last LDL was at goal.  11. Mammogram declined Recommend mammogram for screening.  Patient declines    Patient was given the opportunity to ask questions.  Patient verbalized understanding of the plan and was able to repeat key elements of the plan.   Orders Placed This Encounter  Procedures   CBC   Hepatic function panel   TSH+T4F+T3Free     Requested Prescriptions    No prescriptions requested or ordered in this encounter    Return in about 3 months (around 05/22/2021) for sign to get records from [facility]. Give appt Franky Macho in 1 mth for Medicare Wellness visit.Jonah Blue, MD, FACP

## 2021-02-22 NOTE — Patient Instructions (Signed)
I encourage you to eat a hot breakfast every morning instead of just yogurt.  Take some Imodium as needed for the current diarrhea episode that you are having.    You should use your cane when in public and when transferring from sitting to standing.  Please sign a release for [place] to get your medical records from your previous primary doctor.

## 2021-02-23 LAB — CBC
Hematocrit: 34.1 % (ref 34.0–46.6)
Hemoglobin: 11.3 g/dL (ref 11.1–15.9)
MCH: 29.4 pg (ref 26.6–33.0)
MCHC: 33.1 g/dL (ref 31.5–35.7)
MCV: 89 fL (ref 79–97)
Platelets: 250 10*3/uL (ref 150–450)
RBC: 3.84 x10E6/uL (ref 3.77–5.28)
RDW: 13.5 % (ref 11.7–15.4)
WBC: 13.2 10*3/uL — ABNORMAL HIGH (ref 3.4–10.8)

## 2021-02-23 LAB — HEPATIC FUNCTION PANEL
ALT: 10 IU/L (ref 0–32)
AST: 11 IU/L (ref 0–40)
Albumin: 4.5 g/dL (ref 3.7–4.7)
Alkaline Phosphatase: 135 IU/L — ABNORMAL HIGH (ref 44–121)
Bilirubin Total: 0.9 mg/dL (ref 0.0–1.2)
Bilirubin, Direct: 0.24 mg/dL (ref 0.00–0.40)
Total Protein: 7.7 g/dL (ref 6.0–8.5)

## 2021-02-23 LAB — TSH+T4F+T3FREE
Free T4: 1.23 ng/dL (ref 0.82–1.77)
T3, Free: 2.6 pg/mL (ref 2.0–4.4)
TSH: 4.82 u[IU]/mL — ABNORMAL HIGH (ref 0.450–4.500)

## 2021-02-23 NOTE — Addendum Note (Signed)
Addended by: Jonah Blue B on: 02/23/2021 04:01 PM   Modules accepted: Orders

## 2021-02-24 LAB — WHITE BLOOD COUNT AND DIFFERENTIAL

## 2021-02-25 LAB — WHITE BLOOD COUNT AND DIFFERENTIAL
Basophils Absolute: 0.1 10*3/uL (ref 0.0–0.2)
Basos: 0 %
EOS (ABSOLUTE): 0.4 10*3/uL (ref 0.0–0.4)
Eos: 3 %
Immature Grans (Abs): 0 10*3/uL (ref 0.0–0.1)
Immature Granulocytes: 0 %
Lymphocytes Absolute: 2.7 10*3/uL (ref 0.7–3.1)
Lymphs: 20 %
Monocytes Absolute: 0.8 10*3/uL (ref 0.1–0.9)
Monocytes: 6 %
Neutrophils Absolute: 9.5 10*3/uL — ABNORMAL HIGH (ref 1.4–7.0)
Neutrophils: 71 %
WBC: 13.4 10*3/uL — ABNORMAL HIGH (ref 3.4–10.8)

## 2021-02-25 LAB — SPECIMEN STATUS REPORT

## 2021-03-02 ENCOUNTER — Telehealth: Payer: Self-pay | Admitting: Internal Medicine

## 2021-03-02 NOTE — Telephone Encounter (Signed)
Phone call placed to patient today.  Patient advised that I had sent her lab results to her MyChart account but I see she had not looked at them as yet so I wanted to go over them with her. Inform that her thyroid level was slightly off but not at the point where we need to put her on any medication.  We will plan to recheck in several months. She had elevation of her white blood cell count which I think may have been due to the diarrhea illness she was having the day when she saw me.  Patient tells me that that has since resolved.  She has not had any fever.  She denies dysuria. She had mild elevation in alkaline phosphatase but it has improved compared to previous studies that I see in the system that were done in 2018. All questions were answered.  Patient thanked me for calling.

## 2021-03-07 ENCOUNTER — Ambulatory Visit (INDEPENDENT_AMBULATORY_CARE_PROVIDER_SITE_OTHER): Payer: Medicare Other

## 2021-03-07 DIAGNOSIS — Z95810 Presence of automatic (implantable) cardiac defibrillator: Secondary | ICD-10-CM

## 2021-03-07 DIAGNOSIS — I5022 Chronic systolic (congestive) heart failure: Secondary | ICD-10-CM | POA: Diagnosis not present

## 2021-03-11 NOTE — Progress Notes (Signed)
EPIC Encounter for ICM Monitoring  Patient Name: [name] is a 71 y.o. female Date: 03/11/2021 Primary Care Physican: Marcine Matar, MD Primary Cardiologist: Shirlee Latch Electrophysiologist: Joycelyn Schmid Pacing: 98%         01/11/2021 Office Weight: 176 lbs                                                          Transmission reviewed.    Optivol thoracic impedance suggesting normal fluid levels   Prescribed:  Furosemide 40 mg take 1 tablet (40 mg total) by mouth two times daily.  Potassium 20 mEq take 1 tablet daily  Spironolactone 25 mg take 1 tablet at bedtime.   Labs: 01/26/2021 Creatinine 1.14, BUN 16, Potassium 4.0, Sodium 133, GFR 51 01/19/2021 Creatinine 1.04, BUN 10, Potassium 3.4, Sodium 136, GFR 57  01/11/2021 Creatinine 1.02, BUN 11, Potassium 3.2, Sodium 139, GFR 58  10/27/2020 Creatinine 1.19, BUN 11, Potassium 3.5, Sodium 139, GFR 49 10/11/2020 Creatinine 1.83, BUN 38, Potassium 5.2, Sodium 135, GFR 29 08/17/2020 Creatinine 1.12, BUN 14, Potassium 3.9, Sodium 138, GFR 53 04/23/2020 Creatinine 1.20, BUN 18, Potassium 4.0, Sodium 136, GFR 48 A complete set of results can be found in Results Review.   Recommendations:  No changes.     Follow-up plan: ICM clinic phone appointment on 04/11/2021.   91 day device clinic remote transmission 04/05/2021.   EP/Cardiology Office Visits:  04/11/2021 with Dr Shirlee Latch.  Recall 12/15/2020 with Dr Graciela Husbands    Copy of ICM check sent to Dr. Graciela Husbands.   3 month ICM trend: 03/07/2021.    12-14 Month ICM trend:     Karie Soda, RN 03/11/2021 3:48 PM

## 2021-03-14 ENCOUNTER — Other Ambulatory Visit (HOSPITAL_COMMUNITY): Payer: Self-pay | Admitting: *Deleted

## 2021-03-14 MED ORDER — POTASSIUM CHLORIDE CRYS ER 20 MEQ PO TBCR: 20.0000 meq | EXTENDED_RELEASE_TABLET | Freq: Every day | ORAL | 3 refills | Status: DC

## 2021-03-16 ENCOUNTER — Other Ambulatory Visit (HOSPITAL_COMMUNITY): Payer: Self-pay | Admitting: *Deleted

## 2021-03-16 MED ORDER — POTASSIUM CHLORIDE CRYS ER 20 MEQ PO TBCR: 20.0000 meq | EXTENDED_RELEASE_TABLET | Freq: Every day | ORAL | 3 refills | Status: DC

## 2021-03-17 ENCOUNTER — Other Ambulatory Visit (HOSPITAL_COMMUNITY): Payer: Self-pay | Admitting: Cardiology

## 2021-03-17 MED ORDER — POTASSIUM CHLORIDE CRYS ER 20 MEQ PO TBCR: 20.0000 meq | EXTENDED_RELEASE_TABLET | Freq: Two times a day (BID) | ORAL | 3 refills | Status: DC

## 2021-04-05 ENCOUNTER — Ambulatory Visit (INDEPENDENT_AMBULATORY_CARE_PROVIDER_SITE_OTHER): Payer: Medicare Other

## 2021-04-05 DIAGNOSIS — I428 Other cardiomyopathies: Secondary | ICD-10-CM | POA: Diagnosis not present

## 2021-04-06 LAB — CUP PACEART REMOTE DEVICE CHECK
Battery Remaining Longevity: 15 mo
Battery Voltage: 2.88 V
Brady Statistic AP VP Percent: 0 %
Brady Statistic AP VS Percent: 0 %
Brady Statistic AS VP Percent: 98.55 %
Brady Statistic AS VS Percent: 1.45 %
Brady Statistic RA Percent Paced: 0.01 %
Brady Statistic RV Percent Paced: 0.55 %
Date Time Interrogation Session: 20230321152607
HighPow Impedance: 88 Ohm
Implantable Lead Implant Date: 20151021
Implantable Lead Implant Date: 20151021
Implantable Lead Implant Date: 20151021
Implantable Lead Location: 753858
Implantable Lead Location: 753859
Implantable Lead Location: 753860
Implantable Lead Model: 4298
Implantable Lead Model: 5076
Implantable Pulse Generator Implant Date: 20151021
Lead Channel Impedance Value: 1026 Ohm
Lead Channel Impedance Value: 1026 Ohm
Lead Channel Impedance Value: 1064 Ohm
Lead Channel Impedance Value: 1083 Ohm
Lead Channel Impedance Value: 1140 Ohm
Lead Channel Impedance Value: 475 Ohm
Lead Channel Impedance Value: 532 Ohm
Lead Channel Impedance Value: 532 Ohm
Lead Channel Impedance Value: 532 Ohm
Lead Channel Impedance Value: 608 Ohm
Lead Channel Impedance Value: 665 Ohm
Lead Channel Impedance Value: 703 Ohm
Lead Channel Impedance Value: 779 Ohm
Lead Channel Pacing Threshold Amplitude: 0.5 V
Lead Channel Pacing Threshold Amplitude: 0.5 V
Lead Channel Pacing Threshold Amplitude: 1.125 V
Lead Channel Pacing Threshold Pulse Width: 0.4 ms
Lead Channel Pacing Threshold Pulse Width: 0.4 ms
Lead Channel Pacing Threshold Pulse Width: 0.5 ms
Lead Channel Sensing Intrinsic Amplitude: 10.25 mV
Lead Channel Sensing Intrinsic Amplitude: 10.25 mV
Lead Channel Sensing Intrinsic Amplitude: 3 mV
Lead Channel Sensing Intrinsic Amplitude: 3 mV
Lead Channel Setting Pacing Amplitude: 1.25 V
Lead Channel Setting Pacing Amplitude: 1.5 V
Lead Channel Setting Pacing Amplitude: 2 V
Lead Channel Setting Pacing Pulse Width: 0.4 ms
Lead Channel Setting Pacing Pulse Width: 1 ms
Lead Channel Setting Sensing Sensitivity: 0.3 mV

## 2021-04-11 ENCOUNTER — Ambulatory Visit (INDEPENDENT_AMBULATORY_CARE_PROVIDER_SITE_OTHER): Payer: Medicare Other

## 2021-04-11 ENCOUNTER — Other Ambulatory Visit: Payer: Self-pay

## 2021-04-11 ENCOUNTER — Ambulatory Visit (HOSPITAL_COMMUNITY)
Admission: RE | Admit: 2021-04-11 | Discharge: 2021-04-11 | Disposition: A | Discharge status: Home / Self Care | Payer: Medicare Other | Source: Ambulatory Visit | Attending: Cardiology | Admitting: Cardiology

## 2021-04-11 ENCOUNTER — Ambulatory Visit (HOSPITAL_BASED_OUTPATIENT_CLINIC_OR_DEPARTMENT_OTHER)
Admission: RE | Admit: 2021-04-11 | Discharge: 2021-04-11 | Disposition: A | Discharge status: Home / Self Care | Payer: Medicare Other | Source: Ambulatory Visit | Attending: Cardiology | Admitting: Cardiology

## 2021-04-11 ENCOUNTER — Encounter (HOSPITAL_COMMUNITY): Payer: Self-pay | Admitting: Cardiology

## 2021-04-11 VITALS — BP 100/60 | HR 67 | Wt 175.0 lb

## 2021-04-11 DIAGNOSIS — Z79899 Other long term (current) drug therapy: Secondary | ICD-10-CM | POA: Diagnosis not present

## 2021-04-11 DIAGNOSIS — Z7902 Long term (current) use of antithrombotics/antiplatelets: Secondary | ICD-10-CM | POA: Diagnosis not present

## 2021-04-11 DIAGNOSIS — Z8673 Personal history of transient ischemic attack (TIA), and cerebral infarction without residual deficits: Secondary | ICD-10-CM | POA: Insufficient documentation

## 2021-04-11 DIAGNOSIS — Z95810 Presence of automatic (implantable) cardiac defibrillator: Secondary | ICD-10-CM | POA: Diagnosis not present

## 2021-04-11 DIAGNOSIS — I447 Left bundle-branch block, unspecified: Secondary | ICD-10-CM | POA: Insufficient documentation

## 2021-04-11 DIAGNOSIS — I5022 Chronic systolic (congestive) heart failure: Secondary | ICD-10-CM

## 2021-04-11 DIAGNOSIS — I5020 Unspecified systolic (congestive) heart failure: Secondary | ICD-10-CM | POA: Diagnosis not present

## 2021-04-11 DIAGNOSIS — I428 Other cardiomyopathies: Secondary | ICD-10-CM | POA: Diagnosis not present

## 2021-04-11 LAB — ECHOCARDIOGRAM COMPLETE
AR max vel: 1.57 cm2
AV Area VTI: 1.51 cm2
AV Area mean vel: 1.41 cm2
AV Mean grad: 4 mmHg
AV Peak grad: 7 mmHg
Ao pk vel: 1.32 m/s
Area-P 1/2: 2.07 cm2
Calc EF: 37.9 %
S' Lateral: 3.7 cm
Single Plane A2C EF: 39 %
Single Plane A4C EF: 38.5 %

## 2021-04-11 LAB — BASIC METABOLIC PANEL
Anion gap: 7 (ref 5–15)
BUN: 37 mg/dL — ABNORMAL HIGH (ref 8–23)
CO2: 21 mmol/L — ABNORMAL LOW (ref 22–32)
Calcium: 9.4 mg/dL (ref 8.9–10.3)
Chloride: 108 mmol/L (ref 98–111)
Creatinine, Ser: 1.79 mg/dL — ABNORMAL HIGH (ref 0.44–1.00)
GFR, Estimated: 30 mL/min — ABNORMAL LOW (ref >60)
Glucose, Bld: 100 mg/dL — ABNORMAL HIGH (ref 70–99)
Potassium: 5.2 mmol/L — ABNORMAL HIGH (ref 3.5–5.1)
Sodium: 136 mmol/L (ref 135–145)

## 2021-04-11 LAB — LIPID PANEL
Cholesterol: 126 mg/dL (ref 0–200)
HDL: 34 mg/dL — ABNORMAL LOW (ref >40)
LDL Cholesterol: 71 mg/dL (ref 0–99)
Total CHOL/HDL Ratio: 3.7 RATIO
Triglycerides: 106 mg/dL (ref <150)
VLDL: 21 mg/dL (ref 0–40)

## 2021-04-11 NOTE — Patient Instructions (Addendum)
Dr Aundra Dubin recommends you have labs done today, we have provided you an order to have this done at [facility] ? ?Your physician recommends that you schedule a follow-up appointment in: 4 months ? ?If you have any questions or concerns before your next appointment please send [place] a message through [place] or call our office at [phone].   ? ?TO LEAVE A MESSAGE FOR THE NURSE SELECT OPTION 2, PLEASE LEAVE A MESSAGE INCLUDING: ?YOUR NAME ?DATE OF BIRTH ?CALL BACK NUMBER ?REASON FOR CALL**this is important as we prioritize the call backs ? ?YOU WILL RECEIVE A CALL BACK THE SAME DAY AS LONG AS YOU CALL BEFORE 4:00 PM ? ?At the [facility], you and your health needs are our priority. As part of our continuing mission to provide you with exceptional heart care, we have created designated Provider Care Teams. These Care Teams include your primary Cardiologist (physician) and Advanced Practice Providers (APPs- Physician Assistants and Nurse Practitioners) who all work together to provide you with the care you need, when you need it.  ? ?You may see any of the following providers on your designated Care Team at your next follow up: ?Dr Glori Bickers ?Dr Loralie Champagne ?Darrick Grinder, NP ?Lyda Jester, PA ?Jessica Milford,NP ?Marlyce Huge, PA ?Audry Riles, PharmD ? ? ?Please be sure to bring in all your medications bottles to every appointment.  ? ? ?

## 2021-04-11 NOTE — Progress Notes (Signed)
Patient ID: [name], female   DOB: 1950-03-30, 71 y.o.   MRN: [id number] ?PCP: Marcine Matar, MD ?EP: Dr Graciela Husbands.  ?Cardiologist: Dr Shirlee Latch  ?Neurologist: Dr Pearlean Brownie ? ?HPI: ?[name] is a 71 y.o. female with chronic systolic CHF with nonischemic cardiomypathy (no significant CAD) s/p Medtronic CRT-D, LBBB, mild-mod MR and CVA (09/2013). ? ?Coronary angiography in 7/15 showed no significant CAD.  Admitted in 9/15 with right sided facial droop and expressive aphasia, CVA on MRI. Had TEE showing EF 15%.   ? ?QRS narrowed, so in 11/16 device was reprogrammed to VVI.  ? ?Echo in 12/17 showed EF up to 45-50%.  Echo in 3/19 showed EF 45-50% with moderate LVH and normal RV.  ? ?Echo was done in 4/21 showing EF decreased to 30-35% with LAD territory wall motion abnormalities.  LHC/RHC in 5/21 showed normal filling pressures and cardiac output, no significant CAD.  With persistent LBBB, CRT was activated by Dr. Graciela Husbands in 9/21.  ? ?Echo was done today and reviewed, EF 35-40%, diffuse hypokinesis, mildly decreased RV systolic function.  ? ?She returns for followup of CHF.  Weight down 1 lb.  No dyspnea walking on flat ground.  She uses a cane for stability.  No chest pain. Occasional lightheadedness when standing but no falls.  No orthopnea/PND.     ? ?Medtronic device interrogation: stable thoracic impedance, no AF/VT.  ? ?Labs (2/21): K 3.8, creatinine 0.96, LDL 105  ?Labs (9/21): LDL 92 ?Labs (12/21): K 3.7, creatinine 0.87 ?Labs (4/22): LDL 66 ?Labs (8/22): K 3.9, creatinine 1.12 ?Labs (1/23): K 4, creatinine 1.14 ? ?ECG (personally reviewed): NSR, BiV paced ? ?PMH: ?1. Nonischemic cardiomyopathy: Echo (7/15) with severe LV dilation, EF 25% with diffuse hypokinesis, mild to moderate MR.  RHC/ LHC 08/08/13 with RA 4, PA 23/8, PCWP 11, CI 2.52; no CAD.  TEE (9/15) with EF 15-20%, severe global hypokinesis, no LV thrombus, moderate MR, normal RV.  Echo (10/2013): EF 25-30%, grade I DD, mild AI. Medtronic CRT-D (10/15).   Echo (2/16) with EF 25-30%, diffuse hypokinesis worse in the septum, grade I diastolic dysfunction, normal RV size and systolic function, mild-moderate MR.  ?- Echo (8/16) with EF 35-40%, diffuse hypokinesis, mild-moderate mitral regurgitation.  ?- Echo (12/17): EF 45-50%, mild LVH, mild to moderate MR.  ?- Echo (3/19): EF 45-50% with moderate LVH, mild MR, normal RV size and systolic function.  ?- Echo (4/21): EF 30-35% with LAD territory wall motion abnormalities. ?- LHC/RHC (5/21): No significant CAD; mean RA 6, PA 31/13, mean PCWP 13, CI 3.37.  ?- Echo (3/23): EF 35-40%, diffuse hypokinesis, mildly decreased RV systolic function. ?2. CVA: 9/15 with dysarthria.  Carotids 9/15 with no significant CAD.  ?- Carotid dopplers (10/17): mild plaque, normal subclavians.  ?3. LBBB: Medtronic CRT-D (10/2013) ?4. Hyperlipidemia ?5. Cholelithiasis ?6. Peripheral arterial dopplers (6/19): Normal ? ?SH: lives with her son and husband. Does not drink or smoke. Currently not working.  ? ?FH: Mom had A fib and Heart Failure. Deceased 1996  ?      Sister has lupus ? ?ROS: All systems negative except as listed in HPI, PMH and Problem List. ? ? ?Current Outpatient Medications  ?Medication Sig Dispense Refill  ? albuterol (VENTOLIN HFA) 108 (90 Base) MCG/ACT inhaler Inhale 2 puffs into the lungs at bedtime as needed for wheezing or shortness of breath. 18 g 3  ? atorvastatin (LIPITOR) 80 MG tablet Take 1 tablet (80 mg total) by mouth daily at  6 PM. 90 tablet 3  ? carvedilol (COREG) 6.25 MG tablet Take 1 tablet (6.25 mg total) by mouth 2 (two) times daily with a meal. 180 tablet 3  ? cetirizine (ZYRTEC) 10 MG tablet Take 10 mg by mouth daily.    ? clopidogrel (PLAVIX) 75 MG tablet Take 1 tablet (75 mg total) by mouth daily. 90 tablet 3  ? dapagliflozin propanediol (FARXIGA) 10 MG TABS tablet Take 1 tablet (10 mg total) by mouth daily before breakfast. 90 tablet 3  ? fluticasone (FLONASE) 50 MCG/ACT nasal spray Place 2 sprays into  both nostrils daily. 16 g 0  ? furosemide (LASIX) 40 MG tablet Take 1 tablet (40 mg total) by mouth 2 (two) times daily. 180 tablet 3  ? pantoprazole (PROTONIX) 40 MG tablet Take 40 mg by mouth daily.     ? potassium chloride SA (KLOR-CON M) 20 MEQ tablet Take 1 tablet (20 mEq total) by mouth 2 (two) times daily. 180 tablet 3  ? sacubitril-valsartan (ENTRESTO) 49-51 MG Take 1 tablet by mouth 2 (two) times daily. 180 tablet 3  ? spironolactone (ALDACTONE) 25 MG tablet Take 1 tablet (25 mg total) by mouth at bedtime. 90 tablet 3  ? ?No current facility-administered medications for this encounter.  ? ? ?Vitals:  ? 04/11/21 1118  ?BP: 100/60  ?Pulse: 67  ?SpO2: 100%  ?Weight: 79.4 kg (175 lb)  ? ? ?PHYSICAL EXAM: ?General: NAD ?Neck: No JVD, no thyromegaly or thyroid nodule.  ?Lungs: Clear to auscultation bilaterally with normal respiratory effort. ?CV: Nondisplaced PMI.  Heart regular S1/S2, no S3/S4, no murmur.  No peripheral edema.  No carotid bruit.  Normal pedal pulses.  ?Abdomen: Soft, nontender, no hepatosplenomegaly, no distention.  ?Skin: Intact without lesions or rashes.  ?Neurologic: Alert and oriented x 3.  ?Psych: Normal affect. ?Extremities: No clubbing or cyanosis.  ?HEENT: Normal.  ? ?ASSESSMENT & PLAN: ? ?1. Chronic Systolic Heart Failure: Nonischemic cardiomyopathy s/p Medtronic CRT-D in 10/15, coronary angiography without CAD in 07/2013. Echo done in 2/16, EF 25-30%.  Repeat echo in 8/16 with EF up to 35-40%.  Echo in 4/19 showed EF up to 45-50%.  However, echo in 4/21 showed EF back down to 30-35%.  LHC/RHC in 5/21 showed no CAD, normal filling pressures, preserved cardiac output.  Of note, Medtronic device had been reprogrammed to VVI as LBBB has been only intermittent => more recently, LBBB has been persistent and CRT was reactivated in 9/21. Echo today showed EF 35-40%, diffuse hypokinesis, mildly decreased RV systolic function.  NYHA class II symptoms. She is not volume overloaded by exam or  Optivol.     ?- Continue Coreg 6.25 mg bid, she did not tolerate increase to 9.375 mg bid.   ?- Continue Entresto 49/51 bid, no BP room to increase.  BMET today.    ?- She is on Lasix 40 mg bid, I suspect this can be cut back, will see what creatinine is today first.  ?- Continue spironolactone 25 mg daily.  ?- Continue dapagliflozin 10 mg daily.   ?2. CVA: ?Embolic.  No atrial fibrillation has been detected so far on device.  She will continue Plavix for now. If future device interrogation shows atrial fibrillation, she will need to be anticoagulated.   ?3. Hyperlipidemia: Goal LDL < 70 with history of CVA.  Continue statin.  ?- Check lipids today.  ? ?Followup in 4 months with APP.  ? ?[name] ?04/11/2021 ? ? ?

## 2021-04-11 NOTE — Progress Notes (Signed)
?  Echocardiogram ?2D Echocardiogram has been performed. ? ?Gerda Diss ?04/11/2021, 10:44 AM ?

## 2021-04-13 ENCOUNTER — Telehealth (HOSPITAL_COMMUNITY): Payer: Self-pay | Admitting: *Deleted

## 2021-04-13 DIAGNOSIS — I5022 Chronic systolic (congestive) heart failure: Secondary | ICD-10-CM

## 2021-04-13 MED ORDER — FUROSEMIDE 40 MG PO TABS: 40.0000 mg | ORAL_TABLET | Freq: Every day | ORAL | 3 refills | Status: DC

## 2021-04-13 NOTE — Progress Notes (Signed)
EPIC Encounter for ICM Monitoring ? ?Patient Name: [name] is a 71 y.o. female ?Date: 04/13/2021 ?Primary Care Physican: Marcine Matar, MD ?Primary Cardiologist: Shirlee Latch ?Electrophysiologist: Graciela Husbands ?Bi-V Pacing: 97.9%         ?01/11/2021 Office Weight: 176 lbs ?                                                       ?  ?Transmission reviewed.  ?  ?Optivol thoracic impedance suggesting normal fluid levels ?  ?Prescribed:  ?Furosemide 40 mg take 1 tablet (40 mg total) by mouth two times daily.  ?Potassium 20 mEq take 1 tablet by mouth twice a day ?Spironolactone 25 mg take 1 tablet at bedtime. ?  ?Labs: ?04/11/2021 Creatinine 1.79, BUN 37, Potassium 5.2, Sodium 136, GFR 30 ?01/26/2021 Creatinine 1.14, BUN 16, Potassium 4.0, Sodium 133, GFR 51 ?01/19/2021 Creatinine 1.04, BUN 10, Potassium 3.4, Sodium 136, GFR 57  ?A complete set of results can be found in Results Review. ?  ?Recommendations:  No changes.   ?  ?Follow-up plan: ICM clinic phone appointment on 05/16/2021.   91 day device clinic remote transmission 07/05/2021. ?  ?EP/Cardiology Office Visits:  08/08/2021 with HF clinic.  Recall 12/15/2020 with Dr Graciela Husbands  ?  ?Copy of ICM check sent to Dr. Graciela Husbands.   ? ?3 month ICM trend: 04/11/2021. ? ? ? ?12-14 Month ICM trend:  ? ? ? ?Karie Soda, RN ?04/13/2021 ?4:49 PM ? ?

## 2021-04-13 NOTE — Telephone Encounter (Signed)
-----   Message from Larey Dresser, MD sent at 04/11/2021 11:53 PM EDT ----- ?Stop KCl.  Hold Lasix for 2 days then decrease to 40 mg daily (has been taking 40 mg bid).  BMET in 1 week.  ?

## 2021-04-13 NOTE — Telephone Encounter (Signed)
Noralee Space, RN  ?04/13/2021  5:18 PM EDT Back to Top  ?  ?Pt aware, agreeable, and verbalized understanding, repeat lab sch 4/6  ? ?

## 2021-04-20 NOTE — Progress Notes (Signed)
Remote ICD transmission.   

## 2021-04-21 ENCOUNTER — Ambulatory Visit (HOSPITAL_COMMUNITY)
Admission: RE | Admit: 2021-04-21 | Discharge: 2021-04-21 | Disposition: A | Discharge status: Home / Self Care | Payer: Medicare Other | Source: Ambulatory Visit | Attending: Cardiology | Admitting: Cardiology

## 2021-04-21 DIAGNOSIS — I5022 Chronic systolic (congestive) heart failure: Secondary | ICD-10-CM | POA: Diagnosis present

## 2021-04-21 LAB — BASIC METABOLIC PANEL
Anion gap: 9 (ref 5–15)
BUN: 30 mg/dL — ABNORMAL HIGH (ref 8–23)
CO2: 21 mmol/L — ABNORMAL LOW (ref 22–32)
Calcium: 8.9 mg/dL (ref 8.9–10.3)
Chloride: 107 mmol/L (ref 98–111)
Creatinine, Ser: 1.85 mg/dL — ABNORMAL HIGH (ref 0.44–1.00)
GFR, Estimated: 29 mL/min — ABNORMAL LOW (ref >60)
Glucose, Bld: 109 mg/dL — ABNORMAL HIGH (ref 70–99)
Potassium: 3.6 mmol/L (ref 3.5–5.1)
Sodium: 137 mmol/L (ref 135–145)

## 2021-04-22 ENCOUNTER — Telehealth (HOSPITAL_COMMUNITY): Payer: Self-pay

## 2021-04-22 DIAGNOSIS — I5022 Chronic systolic (congestive) heart failure: Secondary | ICD-10-CM

## 2021-04-22 NOTE — Telephone Encounter (Addendum)
Pt aware, agreeable, and verbalized understanding ? ?Lab scheduled  ? ?----- Message from Larey Dresser, MD sent at 04/21/2021  4:05 PM EDT ----- ?Hold Lasix for a day, decrease Lasix to 20 mg daily.  BMET next week.  ?

## 2021-04-27 ENCOUNTER — Other Ambulatory Visit (HOSPITAL_COMMUNITY): Payer: Self-pay | Admitting: Internal Medicine

## 2021-04-27 ENCOUNTER — Other Ambulatory Visit (HOSPITAL_COMMUNITY): Payer: Self-pay

## 2021-04-27 MED ORDER — ENTRESTO 49-51 MG PO TABS
1.0000 | ORAL_TABLET | Freq: Two times a day (BID) | ORAL | 3 refills | Status: DC
Start: 2021-04-27 — End: 2021-10-19

## 2021-04-27 MED ORDER — CARVEDILOL 6.25 MG PO TABS: 6.2500 mg | ORAL_TABLET | Freq: Two times a day (BID) | ORAL | 3 refills | Status: DC

## 2021-04-29 ENCOUNTER — Emergency Department (HOSPITAL_COMMUNITY): Payer: Medicare Other

## 2021-04-29 ENCOUNTER — Ambulatory Visit (HOSPITAL_COMMUNITY)
Admission: RE | Admit: 2021-04-29 | Discharge: 2021-04-29 | Disposition: A | Discharge status: Home / Self Care | Payer: Medicare Other | Source: Ambulatory Visit | Attending: Internal Medicine | Admitting: Internal Medicine

## 2021-04-29 ENCOUNTER — Emergency Department (HOSPITAL_COMMUNITY)
Admission: EM | Admit: 2021-04-29 | Discharge: 2021-04-29 | Disposition: A | Discharge status: Home / Self Care | Payer: Medicare Other | Attending: Emergency Medicine | Admitting: Emergency Medicine

## 2021-04-29 DIAGNOSIS — Z7902 Long term (current) use of antithrombotics/antiplatelets: Secondary | ICD-10-CM | POA: Diagnosis not present

## 2021-04-29 DIAGNOSIS — S0990XA Unspecified injury of head, initial encounter: Secondary | ICD-10-CM | POA: Diagnosis present

## 2021-04-29 DIAGNOSIS — S0003XA Contusion of scalp, initial encounter: Secondary | ICD-10-CM | POA: Diagnosis not present

## 2021-04-29 DIAGNOSIS — I5022 Chronic systolic (congestive) heart failure: Secondary | ICD-10-CM | POA: Insufficient documentation

## 2021-04-29 DIAGNOSIS — W01198A Fall on same level from slipping, tripping and stumbling with subsequent striking against other object, initial encounter: Secondary | ICD-10-CM | POA: Insufficient documentation

## 2021-04-29 DIAGNOSIS — S098XXA Other specified injuries of head, initial encounter: Secondary | ICD-10-CM

## 2021-04-29 LAB — COMPREHENSIVE METABOLIC PANEL
ALT: 16 U/L (ref 0–44)
AST: 18 U/L (ref 15–41)
Albumin: 3.8 g/dL (ref 3.5–5.0)
Alkaline Phosphatase: 108 U/L (ref 38–126)
Anion gap: 10 (ref 5–15)
BUN: 26 mg/dL — ABNORMAL HIGH (ref 8–23)
CO2: 22 mmol/L (ref 22–32)
Calcium: 9.3 mg/dL (ref 8.9–10.3)
Chloride: 106 mmol/L (ref 98–111)
Creatinine, Ser: 1.52 mg/dL — ABNORMAL HIGH (ref 0.44–1.00)
GFR, Estimated: 36 mL/min — ABNORMAL LOW (ref >60)
Glucose, Bld: 107 mg/dL — ABNORMAL HIGH (ref 70–99)
Potassium: 3.9 mmol/L (ref 3.5–5.1)
Sodium: 138 mmol/L (ref 135–145)
Total Bilirubin: 1.1 mg/dL (ref 0.3–1.2)
Total Protein: 7.5 g/dL (ref 6.5–8.1)

## 2021-04-29 LAB — PROTIME-INR
INR: 1 (ref 0.8–1.2)
Prothrombin Time: 13.2 seconds (ref 11.4–15.2)

## 2021-04-29 LAB — CBC
HCT: 32.7 % — ABNORMAL LOW (ref 36.0–46.0)
Hemoglobin: 10.8 g/dL — ABNORMAL LOW (ref 12.0–15.0)
MCH: 30.6 pg (ref 26.0–34.0)
MCHC: 33 g/dL (ref 30.0–36.0)
MCV: 92.6 fL (ref 80.0–100.0)
Platelets: 266 10*3/uL (ref 150–400)
RBC: 3.53 MIL/uL — ABNORMAL LOW (ref 3.87–5.11)
RDW: 14.4 % (ref 11.5–15.5)
WBC: 11.1 10*3/uL — ABNORMAL HIGH (ref 4.0–10.5)
nRBC: 0 % (ref 0.0–0.2)

## 2021-04-29 LAB — SAMPLE TO BLOOD BANK

## 2021-04-29 LAB — I-STAT CHEM 8, ED
BUN: 30 mg/dL — ABNORMAL HIGH (ref 8–23)
Calcium, Ion: 1.04 mmol/L — ABNORMAL LOW (ref 1.15–1.40)
Chloride: 106 mmol/L (ref 98–111)
Creatinine, Ser: 1.6 mg/dL — ABNORMAL HIGH (ref 0.44–1.00)
Glucose, Bld: 106 mg/dL — ABNORMAL HIGH (ref 70–99)
HCT: 32 % — ABNORMAL LOW (ref 36.0–46.0)
Hemoglobin: 10.9 g/dL — ABNORMAL LOW (ref 12.0–15.0)
Potassium: 4 mmol/L (ref 3.5–5.1)
Sodium: 138 mmol/L (ref 135–145)
TCO2: 27 mmol/L (ref 22–32)

## 2021-04-29 LAB — BASIC METABOLIC PANEL
Anion gap: 7 (ref 5–15)
BUN: 25 mg/dL — ABNORMAL HIGH (ref 8–23)
CO2: 26 mmol/L (ref 22–32)
Calcium: 9.2 mg/dL (ref 8.9–10.3)
Chloride: 105 mmol/L (ref 98–111)
Creatinine, Ser: 1.45 mg/dL — ABNORMAL HIGH (ref 0.44–1.00)
GFR, Estimated: 38 mL/min — ABNORMAL LOW (ref >60)
Glucose, Bld: 92 mg/dL (ref 70–99)
Potassium: 3.4 mmol/L — ABNORMAL LOW (ref 3.5–5.1)
Sodium: 138 mmol/L (ref 135–145)

## 2021-04-29 NOTE — ED Notes (Signed)
Trauma Response Nurse Documentation ? ? ?[name] is a 71 y.o. female arriving to [facility] ED via EMS ? ?On clopidogrel 75 mg daily. Trauma was activated as a Level 2 by ED Charge RN based on the following trauma criteria Elderly patients > 65 with head trauma on anti-coagulation (excluding ASA). Trauma RN at the bedside on patient arrival. Patient cleared for CT by Dr. Wallace Cullens. Patient to CT with team. GCS 15. ? ?History  ? Past Medical History:  ?Diagnosis Date  ? Arthritis   ? Automatic implantable cardioverter-defibrillator in situ   ? Cardiac resynchronization therapy defibrillator (CRT-D) in place   ? a. placed on 11/05/13.  ? GERD (gastroesophageal reflux disease)   ? HLD (hyperlipidemia)   ? Hypotension   ? LBBB (left bundle branch block)   ? Mitral regurgitation   ? a. Mild-mod MR by echo 07/2013.  ? PONV (postoperative nausea and vomiting)   ? Stroke (HCC) 09/23/2013  ? Systolic CHF (HCC)   ? a. Dx 07/2013 - due to NICM. Cath normal cors. EF 25%.  ?  ? Past Surgical History:  ?Procedure Laterality Date  ? BI-VENTRICULAR IMPLANTABLE CARDIOVERTER DEFIBRILLATOR N/A 11/05/2013  ? Procedure: BI-VENTRICULAR IMPLANTABLE CARDIOVERTER DEFIBRILLATOR  (CRT-D);  Surgeon: Duke Salvia, MD;  Location: [facility] CATH LAB;  Service: Cardiovascular;  Laterality: N/A;  ? BI-VENTRICULAR IMPLANTABLE CARDIOVERTER DEFIBRILLATOR  (CRT-D)  11/05/2013  ? CARDIAC CATHETERIZATION  07/2013  ? KNEE ARTHROSCOPY Right   ? LEFT AND RIGHT HEART CATHETERIZATION WITH CORONARY ANGIOGRAM N/A 08/08/2013  ? Procedure: LEFT AND RIGHT HEART CATHETERIZATION WITH CORONARY ANGIOGRAM;  Surgeon: [name], MD;  Location: [facility] CATH LAB;  Service: Cardiovascular;  Laterality: N/A;  ? RIGHT/LEFT HEART CATH AND CORONARY ANGIOGRAPHY N/A 06/05/2019  ? Procedure: RIGHT/LEFT HEART CATH AND CORONARY ANGIOGRAPHY;  Surgeon: [name], MD;  Location: [facility] INVASIVE CV LAB;  Service: Cardiovascular;  Laterality: N/A;  ? TEE WITHOUT CARDIOVERSION N/A  09/26/2013  ? Procedure: TRANSESOPHAGEAL ECHOCARDIOGRAM (TEE);  Surgeon: [name], MD;  Location: [facility] ENDOSCOPY;  Service: Cardiovascular;  Laterality: N/A;  ? TONSILLECTOMY    ? WRIST ARTHROSCOPY  12/21/2010  ? Procedure: ARTHROSCOPY WRIST;  Surgeon: [name], MD;  Location: [facility];  Service: Orthopedics;  Laterality: Right;  right wrist, repair TFCC on right, open reconstruction ECU sheath  ?  ? ? ?Initial Focused Assessment (If applicable, or please see trauma documentation): ?- GCS 15 ?- PERRLA 4's brisk ?- c-collar in place ?- MAE ?- VS WDL ?- Hematoma to R posterior head w/ dried blood extending down right side of neck ? ?CT's Completed:   ?CT Head and CT C-Spine  ? ?Interventions:  ?- 20G PIV to R [facility] ?- trauma labs ?- logrolled ?- undressed patient and applied warm blankets ?- CT head and c-spine ? ?Plan for disposition:  ?Other Awaiting scan results ? ?Consults completed:  ?none at 1330. ? ?Event Summary: ?Pt had just been at a doctor's appt and had bloodwork drawn when she was getting in cab and got hung up on the elastic string on her raincoat.  She proceeded to fall and hit the posterior right side of her head.  No LOC.  Pt BIB [facility] in c-collar for precaution. ? ?MTP Summary (If applicable): n/a ? ?Bedside handoff with ED RN Genevie Cheshire.   ? ?Londell Moh W  ?Trauma Response RN ? ?Please call TRN at [phone] for further assistance. ? ? ?

## 2021-04-29 NOTE — ED Triage Notes (Signed)
Pt arrives via [facility] for Level 2 Fall on Thinners. Pt was exiting taxicab and tripped. Pt denies LOC. Contusion to back of head. C-collar in place by EMS. Pt on plavix ?

## 2021-04-29 NOTE — ED Provider Notes (Signed)
?[facility] EMERGENCY DEPARTMENT ?Provider Note ? ? ?CSN: [id number] ?Arrival date & time: 04/29/21  1243 ? ?  ? ?History ? ?Chief Complaint  ?Patient presents with  ? Level 2 Fall on Thinners  ? ? ?CAELYNN LEFFLER is a 71 y.o. female. ? ?Patient is a 71 year old female with ICD in place on Plavix presenting for complaints of fall.  Patient states she was walking out of [employer] and attempting to get in her vehicle when her sweater strings got caught on the car causing her to fall backwards.  States she fell backwards on her buttocks and then hit the back of her head on the pavement.  Its to a wound and bleeding from school.  Denies any other bony tenderness at this time.  Denies loss of consciousness or any prodromal symptoms.  Sensation or motor dysfunction in limbs. ? ?The history is provided by the patient. No language interpreter was used.  ? ?  ? ?Home Medications ?Prior to Admission medications   ?Medication Sig Start Date End Date Taking? Authorizing Provider  ?albuterol (VENTOLIN HFA) 108 (90 Base) MCG/ACT inhaler Inhale 2 puffs into the lungs at bedtime as needed for wheezing or shortness of breath. 09/16/18   Westley Chandler, MD  ?atorvastatin (LIPITOR) 80 MG tablet Take 1 tablet (80 mg total) by mouth daily at 6 PM. 09/23/20   Bensimhon, Bevelyn Buckles, MD  ?carvedilol (COREG) 6.25 MG tablet Take 1 tablet (6.25 mg total) by mouth 2 (two) times daily with a meal. 04/27/21   [name], MD  ?cetirizine (ZYRTEC) 10 MG tablet Take 10 mg by mouth daily.    [provider]  ?clopidogrel (PLAVIX) 75 MG tablet Take 1 tablet (75 mg total) by mouth daily. 04/10/18   Westley Chandler, MD  ?dapagliflozin propanediol (FARXIGA) 10 MG TABS tablet Take 1 tablet (10 mg total) by mouth daily before breakfast. 01/01/20   Clegg, Amy D, NP  ?fluticasone (FLONASE) 50 MCG/ACT nasal spray Place 2 sprays into both nostrils daily. 09/16/18   Westley Chandler, MD  ?furosemide (LASIX) 40 MG tablet Take 1 tablet (40  mg total) by mouth daily. 04/13/21 07/12/21  [name], MD  ?pantoprazole (PROTONIX) 40 MG tablet Take 40 mg by mouth daily.  12/21/18   [provider]  ?sacubitril-valsartan (ENTRESTO) 49-51 MG Take 1 tablet by mouth 2 (two) times daily. 04/27/21   [name], MD  ?spironolactone (ALDACTONE) 25 MG tablet Take 1 tablet (25 mg total) by mouth at bedtime. 04/27/21   Bensimhon, Bevelyn Buckles, MD  ?   ? ?Allergies    ?Onion, Tomato, and Voltaren [diclofenac]   ? ?Review of Systems   ?Review of Systems  ?Constitutional:  Negative for chills and fever.  ?HENT:  Negative for ear pain and sore throat.   ?Eyes:  Negative for pain and visual disturbance.  ?Respiratory:  Negative for cough and shortness of breath.   ?Cardiovascular:  Negative for chest pain and palpitations.  ?Gastrointestinal:  Negative for abdominal pain and vomiting.  ?Genitourinary:  Negative for dysuria and hematuria.  ?Musculoskeletal:  Negative for arthralgias and back pain.  ?Skin:  Positive for wound. Negative for color change and rash.  ?Neurological:  Negative for seizures and syncope.  ?All other systems reviewed and are negative. ? ?Physical Exam ?Updated Vital Signs ?BP 116/80   Pulse 81   Temp 98.5 ?F (36.9 ?C) (Oral)   Resp 18   Ht 5\' 5"  (1.651 m)  Wt 79.4 kg   SpO2 100%   BMI 29.12 kg/m?  ?Physical Exam ?Vitals and nursing note reviewed.  ?Constitutional:   ?   General: She is not in acute distress. ?   Appearance: She is well-developed.  ?HENT:  ?   Head: Normocephalic and atraumatic.  ?Eyes:  ?   Conjunctiva/sclera: Conjunctivae normal.  ?Cardiovascular:  ?   Rate and Rhythm: Normal rate and regular rhythm.  ?   Heart sounds: No murmur heard. ?Pulmonary:  ?   Effort: Pulmonary effort is normal. No respiratory distress.  ?   Breath sounds: Normal breath sounds.  ?Abdominal:  ?   Palpations: Abdomen is soft.  ?   Tenderness: There is no abdominal tenderness.  ?Musculoskeletal:     ?   General: No swelling.  ?   Cervical  back: Neck supple. No bony tenderness.  ?   Thoracic back: No bony tenderness.  ?   Lumbar back: No bony tenderness.  ?Skin: ?   General: Skin is warm and dry.  ?   Capillary Refill: Capillary refill takes less than 2 seconds.  ?Neurological:  ?   General: No focal deficit present.  ?   Mental Status: She is alert and oriented to person, place, and time.  ?   GCS: GCS eye subscore is 4. GCS verbal subscore is 5. GCS motor subscore is 6.  ?   Cranial Nerves: Cranial nerves 2-12 are intact.  ?   Sensory: Sensation is intact.  ?   Motor: Motor function is intact.  ?   Coordination: Coordination is intact.  ?   Gait: Gait is intact.  ?Psychiatric:     ?   Mood and Affect: Mood normal.  ? ? ?ED Results / Procedures / Treatments   ?Labs ?(all labs ordered are listed, but only abnormal results are displayed) ?Labs Reviewed  ?RESP PANEL BY RT-PCR (FLU A&B, COVID) ARPGX2  ?COMPREHENSIVE METABOLIC PANEL  ?CBC  ?ETHANOL  ?URINALYSIS, ROUTINE W REFLEX MICROSCOPIC  ?LACTIC ACID, PLASMA  ?PROTIME-INR  ?I-STAT CHEM 8, ED  ?SAMPLE TO BLOOD BANK  ? ? ?EKG ?None ? ?Radiology ?No results found. ? ?Procedures ?Procedures  ? ? ?Medications Ordered in ED ?Medications - No data to display ? ?ED Course/ Medical Decision Making/ A&P ?  ?                        ?Medical Decision Making ?Amount and/or Complexity of Data Reviewed ?Labs: ordered. ?Radiology: ordered. ? ? ?87:43 PM ? 71 year old female with ICD in place on Plavix presenting for complaints of fall.  Patient is alert and oriented x3, no acute distress, afebrile, stable vital signs.  Physical exam demonstrates no midline spinal tenderness.  No neurovascular deficits.  Patient has contusion with superficial abrasion to right parietal lobe. ? ?CT head demonstrates scalp hematoma only.  No intracranial bleeds.  No cervical spinal fractures. ? ?Patient in no distress and overall condition improved here in the ED. Detailed discussions were had with the patient regarding current findings,  and need for close f/u with PCP or on call doctor. The patient has been instructed to return immediately if the symptoms worsen in any way for re-evaluation. Patient verbalized understanding and is in agreement with current care plan. All questions answered prior to discharge. ? ? ? ? ? ? ? ? ?Final Clinical Impression(s) / ED Diagnoses ?Final diagnoses:  ?Blunt head trauma, initial encounter  ? ? ?Rx / DC  Orders ?ED Discharge Orders   ? ? None  ? ?  ? ? ?  ?Franne Forts, DO ?05/02/21 2256 ? ?

## 2021-04-29 NOTE — Progress Notes (Signed)
Responded to page to support pt who experienced a fall while exiting a cab and tripped. Thought to have hit head. Pt not available at time of my visit. Nurse will page Chaplain if needed further. ? ?Fae Pippin, [facility], Pager [phone]  ?

## 2021-05-16 ENCOUNTER — Ambulatory Visit (INDEPENDENT_AMBULATORY_CARE_PROVIDER_SITE_OTHER): Payer: Medicare Other

## 2021-05-16 DIAGNOSIS — I5022 Chronic systolic (congestive) heart failure: Secondary | ICD-10-CM | POA: Diagnosis not present

## 2021-05-16 DIAGNOSIS — Z95810 Presence of automatic (implantable) cardiac defibrillator: Secondary | ICD-10-CM | POA: Diagnosis not present

## 2021-05-20 NOTE — Progress Notes (Signed)
EPIC Encounter for ICM Monitoring ? ?Patient Name: [name] is a 71 y.o. female ?Date: 05/20/2021 ?Primary Care Physican: Ladell Pier, MD ?Primary Cardiologist: Aundra Dubin ?Electrophysiologist: Caryl Comes ?Bi-V Pacing: 97.4%         ?04/11/2021 Office Weight: 175 lbs ?                                                       ?  ?Transmission reviewed.  ?  ?Optivol thoracic impedance suggesting possible fluid accumulation from 4/4-4/30 and returned to normal on 5/1. ?  ?Prescribed:  ?Furosemide 40 mg take 1 tablet (40 mg total) by mouth daily.  ?Spironolactone 25 mg take 1 tablet at bedtime. ?  ?Labs: ?04/29/2021 Creatinine 1.60, BUN 30, Potassium 4.0, Sodium 138 ?04/21/2021 Creatinine 1.85, BUN 30, Potassium 3.6, Sodium 137, GFR 29  ?04/11/2021 Creatinine 1.79, BUN 37, Potassium 5.2, Sodium 136, GFR 30 ?01/26/2021 Creatinine 1.14, BUN 16, Potassium 4.0, Sodium 133, GFR 51 ?01/19/2021 Creatinine 1.04, BUN 10, Potassium 3.4, Sodium 136, GFR 57  ?A complete set of results can be found in Results Review. ?  ?Recommendations:  No changes.   ?  ?Follow-up plan: ICM clinic phone appointment on 06/20/2021.   91 day device clinic remote transmission 07/05/2021. ?  ?EP/Cardiology Office Visits:  08/08/2021 with HF clinic.  Recall 12/15/2020 with Dr Caryl Comes  ?  ?Copy of ICM check sent to Dr. Caryl Comes.   ? ?3 month ICM trend: 05/17/2021. ? ? ? ?12-14 Month ICM trend:  ? ? ? ?Rosalene Billings, RN ?05/20/2021 ?5:22 PM ? ?

## 2021-05-24 ENCOUNTER — Encounter: Payer: Self-pay | Admitting: Internal Medicine

## 2021-05-24 ENCOUNTER — Ambulatory Visit: Payer: Medicare Other | Attending: Internal Medicine | Admitting: Internal Medicine

## 2021-05-24 VITALS — BP 92/60 | HR 60 | Temp 97.9°F | Wt 177.0 lb

## 2021-05-24 DIAGNOSIS — E041 Nontoxic single thyroid nodule: Secondary | ICD-10-CM

## 2021-05-24 DIAGNOSIS — G44311 Acute post-traumatic headache, intractable: Secondary | ICD-10-CM

## 2021-05-24 DIAGNOSIS — M533 Sacrococcygeal disorders, not elsewhere classified: Secondary | ICD-10-CM | POA: Diagnosis not present

## 2021-05-24 DIAGNOSIS — R269 Unspecified abnormalities of gait and mobility: Secondary | ICD-10-CM

## 2021-05-24 DIAGNOSIS — D649 Anemia, unspecified: Secondary | ICD-10-CM

## 2021-05-24 DIAGNOSIS — I959 Hypotension, unspecified: Secondary | ICD-10-CM
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
## 2021-05-25 ENCOUNTER — Ambulatory Visit
Admission: RE | Admit: 2021-05-25 | Discharge: 2021-05-25 | Disposition: A | Discharge status: Home / Self Care | Payer: Medicare Other | Source: Ambulatory Visit | Attending: Internal Medicine | Admitting: Internal Medicine

## 2021-05-25 DIAGNOSIS — M533 Sacrococcygeal disorders, not elsewhere classified: Secondary | ICD-10-CM

## 2021-05-25 LAB — IRON,TIBC AND FERRITIN PANEL
Ferritin: 257 ng/mL — ABNORMAL HIGH (ref 15–150)
Iron Saturation: 23 % (ref 15–55)
Iron: 78 ug/dL (ref 27–139)
Total Iron Binding Capacity: 336 ug/dL (ref 250–450)
UIBC: 258 ug/dL (ref 118–369)

## 2021-05-25 LAB — CBC
Hematocrit: 34.3 % (ref 34.0–46.6)
Hemoglobin: 11.4 g/dL (ref 11.1–15.9)
MCH: 29.8 pg (ref 26.6–33.0)
MCHC: 33.2 g/dL (ref 31.5–35.7)
MCV: 90 fL (ref 79–97)
Platelets: 292 10*3/uL (ref 150–450)
RBC: 3.82 x10E6/uL (ref 3.77–5.28)
RDW: 12.6 % (ref 11.7–15.4)
WBC: 10.1 10*3/uL (ref 3.4–10.8)

## 2021-05-25 LAB — TSH+T4F+T3FREE
Free T4: 1.31 ng/dL (ref 0.82–1.77)
T3, Free: 3 pg/mL (ref 2.0–4.4)
TSH: 3.78 u[IU]/mL (ref 0.450–4.500)

## 2021-06-20 ENCOUNTER — Ambulatory Visit (INDEPENDENT_AMBULATORY_CARE_PROVIDER_SITE_OTHER): Payer: Medicare Other

## 2021-06-20 DIAGNOSIS — Z95810 Presence of automatic (implantable) cardiac defibrillator: Secondary | ICD-10-CM

## 2021-06-20 DIAGNOSIS — I5022 Chronic systolic (congestive) heart failure: Secondary | ICD-10-CM | POA: Diagnosis not present

## 2021-06-20 NOTE — Progress Notes (Signed)
EPIC Encounter for ICM Monitoring  Patient Name: [name] is a 71 y.o. female Date: 06/20/2021 Primary Care Physican: Marcine Matar, MD Primary Cardiologist: Shirlee Latch Electrophysiologist: Joycelyn Schmid Pacing: 97.5%         04/11/2021 Office Weight: 175 lbs 06/20/2021 Weight: 176 lbs                                                           Spoke with husband per DPR and heart failure questions reviewed.  Pt has weight gain of 3-4 lbs but no other fluid symptoms.  Appears misunderstanding that patient was prescribed to take Lasix 40mg  daily but husband thought she was supposed to stop it.  She has not been taking any Lasix since 4/1.  Husband has cancer and will eventually start Chemo.    Optivol thoracic impedance suggesting possible fluid accumulation starting 5/1.  Fluid index > normal threshold starting 5/18.   Prescribed:  Furosemide 40 mg take 1 tablet (40 mg total) by mouth daily.  Spironolactone 25 mg take 1 tablet at bedtime.   Labs: 04/29/2021 Creatinine 1.60, BUN 30, Potassium 4.0, Sodium 138 04/21/2021 Creatinine 1.85, BUN 30, Potassium 3.6, Sodium 137, GFR 29  04/11/2021 Creatinine 1.79, BUN 37, Potassium 5.2, Sodium 136, GFR 30 01/26/2021 Creatinine 1.14, BUN 16, Potassium 4.0, Sodium 133, GFR 51 01/19/2021 Creatinine 1.04, BUN 10, Potassium 3.4, Sodium 136, GFR 57  A complete set of results can be found in Results Review.   Recommendations:  Husband misunderstood instructions from 3/29 HF clinic phone note.  He stopped KCL and Lasix and did not start her on Lasix 40 mg daily as prescribed.  No Lasix since 4/1.     Follow-up plan: ICM clinic phone appointment on 06/27/2021 to recheck fluid levels.   91 day device clinic remote transmission 07/05/2021.   EP/Cardiology Office Visits:  08/08/2021 with HF clinic.  Recall 12/15/2020 with Dr 12/17/2020    Copy of ICM check sent to Dr. Graciela Husbands.   Copy sent to Dr Graciela Husbands for review and confirm patient should restart Lasix 40 mg daily  as prescribed.    3 month ICM trend: 06/20/2021.    12-14 Month ICM trend:     08/20/2021, RN 06/20/2021 4:46 PM

## 2021-06-21 ENCOUNTER — Ambulatory Visit
Admission: RE | Admit: 2021-06-21 | Discharge: 2021-06-21 | Disposition: A | Discharge status: Home / Self Care | Payer: Medicare Other | Source: Ambulatory Visit | Attending: Internal Medicine | Admitting: Internal Medicine

## 2021-06-21 DIAGNOSIS — E041 Nontoxic single thyroid nodule: Secondary | ICD-10-CM

## 2021-06-21 DIAGNOSIS — R269 Unspecified abnormalities of gait and mobility: Secondary | ICD-10-CM

## 2021-06-21 DIAGNOSIS — G44311 Acute post-traumatic headache, intractable: Secondary | ICD-10-CM

## 2021-06-21 DIAGNOSIS — E010 Iodine-deficiency related diffuse (endemic) goiter: Secondary | ICD-10-CM | POA: Diagnosis not present

## 2021-06-21 DIAGNOSIS — S0003XA Contusion of scalp, initial encounter: Secondary | ICD-10-CM | POA: Diagnosis not present

## 2021-06-21 DIAGNOSIS — S0990XA Unspecified injury of head, initial encounter: Secondary | ICD-10-CM | POA: Diagnosis not present

## 2021-06-21 DIAGNOSIS — R519 Headache, unspecified: Secondary | ICD-10-CM | POA: Diagnosis not present

## 2021-06-22 ENCOUNTER — Telehealth: Payer: Self-pay | Admitting: Internal Medicine

## 2021-06-22 NOTE — Telephone Encounter (Signed)
Pt returning providers call, informed pt of message below  Pt verbalized understanding and will refer to her mychart

## 2021-06-22 NOTE — Telephone Encounter (Signed)
Phone call placed to patient today to go over the results of her thyroid biopsy.  I left a message on both her cell phone and home phone informing of who I am and my reason for calling.  I will try to reach her again later. I have also sent her results to her MyChart.  If patient calls back.  Please refer to the MyChart result note.

## 2021-06-22 NOTE — Telephone Encounter (Signed)
Phone call placed to patient again this morning to go over the results of thyroid ultrasound.  Patient tells me she has read the MyChart message that I sent her regarding the results.  She tells me that she prefers to hold off on getting a biopsy done of the thyroid nodule at this time due to other health issues that she is dealing with.  I went over with her that given the size of the nodule we need to make sure that it is not a cancerous nodule.  Patient expressed understanding but states she still wants to hold off on getting a biopsy done.

## 2021-06-23 NOTE — Progress Notes (Addendum)
Attempted call to husband to provide recommendations from Dr Shirlee Latch but will clarify with Dr Shirlee Latch regarding potassium dosage.

## 2021-06-23 NOTE — Progress Notes (Signed)
Dr Shirlee Latch,   Pt was on Potassium 20 mEq 1 tablet twice a day.   Would you like to restart this dosage with Lasix 40 mg daily?   I want to clarify the Potassium dosage please.  Thank you.

## 2021-06-23 NOTE — Progress Notes (Signed)
Restart Lasix 40 mg daily and prior potassium dose.  BMET 1 week.

## 2021-06-23 NOTE — Progress Notes (Signed)
Attempted call to husband per DPR and left message stating no recommendations have been received from Dr Shirlee Latch.  Husband had planned on restarting prescribed Furosemide 40 mg daily.  Will  recheck fluid levels on 6/12.  Left number for return call.

## 2021-06-24 ENCOUNTER — Telehealth: Payer: Self-pay

## 2021-06-24 DIAGNOSIS — I428 Other cardiomyopathies: Secondary | ICD-10-CM

## 2021-06-24 NOTE — Progress Notes (Signed)
Message Received: Grayland Jack, Eliot Ford, MD  Kaiana Marion, Josephine Igo, RN Can start her prior KCl dose.

## 2021-06-24 NOTE — Telephone Encounter (Signed)
Covering Sharman Cheek, RN with ICM clinic. Device RN spoke with patient on behalf of ICM RN and advised of Dr. Claris Gladden recommendations to begin KCL 20mg  BID with lasix 40 mg daily and BMET in 1 week. While on the phone with patient, reviewed EMR and reason KCL was discontinued previously. Patient has hx of stage 3 CKD with elevated K+ 04/11/21 of 5.2. After review of EMR, RN advised patient to not begin KCL until this RN could discuss with Dr. Aundra Dubin who is currently in procedure. Reviewed with Ivory Broad, RN HF clinic who will discuss with Dr. Aundra Dubin and advise patient of medication changes. Patient is called back and advised that HF clinic would be in contact. Patient will continue to present to [facility] in 1 week for [employer]. Order placed.

## 2021-06-27 ENCOUNTER — Ambulatory Visit (INDEPENDENT_AMBULATORY_CARE_PROVIDER_SITE_OTHER): Payer: Medicare Other

## 2021-06-27 DIAGNOSIS — Z95810 Presence of automatic (implantable) cardiac defibrillator: Secondary | ICD-10-CM

## 2021-06-27 DIAGNOSIS — I5022 Chronic systolic (congestive) heart failure: Secondary | ICD-10-CM

## 2021-06-27 NOTE — Progress Notes (Signed)
EPIC Encounter for ICM Monitoring  Patient Name: [name] is a 71 y.o. female Date: 06/27/2021 Primary Care Physican: Ladell Pier, MD Primary Cardiologist: Aundra Dubin Electrophysiologist: Vergie Living Pacing: 97.5%         04/11/2021 Office Weight: 175 lbs 06/20/2021 Weight: 176 lbs                                                           Spoke with husband per DPR and heart failure questions reviewed.  Pt is feeling fine.  She restarted Lasix 40 mg as prescribed and will have BMET drawn on 6/16 to determine potassium levels before restarting Potassium.  Device nurse spoke with HF clinic nurse on 6/9 regarding Potassium dosage and HF nurse was to follow up with Dr Aundra Dubin.    Optivol thoracic impedance suggesting possible fluid accumulation starting 5/1.  Fluid index > normal threshold starting 5/18.   Prescribed:  Furosemide 40 mg take 1 tablet (40 mg total) by mouth daily.  Spironolactone 25 mg take 1 tablet at bedtime.   Labs: 07/01/2021 BMET SCHEDULED at HF CLINIC 04/29/2021 Creatinine 1.60, BUN 30, Potassium 4.0, Sodium 138 04/21/2021 Creatinine 1.85, BUN 30, Potassium 3.6, Sodium 137, GFR 29  04/11/2021 Creatinine 1.79, BUN 37, Potassium 5.2, Sodium 136, GFR 30 01/26/2021 Creatinine 1.14, BUN 16, Potassium 4.0, Sodium 133, GFR 51 01/19/2021 Creatinine 1.04, BUN 10, Potassium 3.4, Sodium 136, GFR 57  A complete set of results can be found in Results Review.   Recommendations:  Will follow up 6/19 to check fluid levels and BMET scheduled 6/16.     Follow-up plan: ICM clinic phone appointment on 07/04/2021 to recheck fluid levels.   91 day device clinic remote transmission 07/05/2021.   EP/Cardiology Office Visits:  08/08/2021 with HF clinic.  Recall 12/15/2020 with Dr Caryl Comes    Copy of ICM check sent to Dr. Caryl Comes.   Copy sent to Dr Aundra Dubin as Juluis Rainier waiting on BMET to be drawn on 6/16 to check potassium levels before restarting potassium.      3 month ICM trend:  06/27/2021.    12-14 Month ICM trend:     Rosalene Billings, RN 06/27/2021 4:16 PM

## 2021-07-01 ENCOUNTER — Ambulatory Visit (HOSPITAL_COMMUNITY)
Admission: RE | Admit: 2021-07-01 | Discharge: 2021-07-01 | Disposition: A | Discharge status: Home / Self Care | Payer: Medicare Other | Source: Ambulatory Visit | Attending: Internal Medicine | Admitting: Internal Medicine

## 2021-07-01 ENCOUNTER — Other Ambulatory Visit (HOSPITAL_COMMUNITY): Payer: Medicare Other

## 2021-07-01 DIAGNOSIS — I5020 Unspecified systolic (congestive) heart failure: Secondary | ICD-10-CM

## 2021-07-01 LAB — BASIC METABOLIC PANEL
Anion gap: 13 (ref 5–15)
BUN: 22 mg/dL (ref 8–23)
CO2: 22 mmol/L (ref 22–32)
Calcium: 8.7 mg/dL — ABNORMAL LOW (ref 8.9–10.3)
Chloride: 104 mmol/L (ref 98–111)
Creatinine, Ser: 1.49 mg/dL — ABNORMAL HIGH (ref 0.44–1.00)
GFR, Estimated: 37 mL/min — ABNORMAL LOW (ref >60)
Glucose, Bld: 111 mg/dL — ABNORMAL HIGH (ref 70–99)
Potassium: 3 mmol/L — ABNORMAL LOW (ref 3.5–5.1)
Sodium: 139 mmol/L (ref 135–145)

## 2021-07-01 NOTE — Progress Notes (Signed)
Message sent to Dr Alford Highland HF clinic nurse regarding 6/19 lab results.

## 2021-07-04 ENCOUNTER — Ambulatory Visit (INDEPENDENT_AMBULATORY_CARE_PROVIDER_SITE_OTHER): Payer: Medicare Other

## 2021-07-04 DIAGNOSIS — Z95810 Presence of automatic (implantable) cardiac defibrillator: Secondary | ICD-10-CM

## 2021-07-04 DIAGNOSIS — I5022 Chronic systolic (congestive) heart failure: Secondary | ICD-10-CM

## 2021-07-05 ENCOUNTER — Ambulatory Visit (INDEPENDENT_AMBULATORY_CARE_PROVIDER_SITE_OTHER): Payer: Medicare Other

## 2021-07-05 ENCOUNTER — Telehealth (HOSPITAL_COMMUNITY): Payer: Self-pay

## 2021-07-05 DIAGNOSIS — I428 Other cardiomyopathies: Secondary | ICD-10-CM | POA: Diagnosis not present

## 2021-07-05 DIAGNOSIS — I5022 Chronic systolic (congestive) heart failure: Secondary | ICD-10-CM

## 2021-07-05 LAB — CUP PACEART REMOTE DEVICE CHECK
Battery Remaining Longevity: 11 mo
Battery Voltage: 2.87 V
Brady Statistic AP VP Percent: 0 %
Brady Statistic AP VS Percent: 0 %
Brady Statistic AS VP Percent: 97.91 %
Brady Statistic AS VS Percent: 2.08 %
Brady Statistic RA Percent Paced: 0 %
Brady Statistic RV Percent Paced: 0.05 %
Date Time Interrogation Session: 20230620054350
HighPow Impedance: 79 Ohm
Implantable Lead Implant Date: 20151021
Implantable Lead Implant Date: 20151021
Implantable Lead Implant Date: 20151021
Implantable Lead Location: 753858
Implantable Lead Location: 753859
Implantable Lead Location: 753860
Implantable Lead Model: 4298
Implantable Lead Model: 5076
Implantable Pulse Generator Implant Date: 20151021
Lead Channel Impedance Value: 1007 Ohm
Lead Channel Impedance Value: 1026 Ohm
Lead Channel Impedance Value: 1026 Ohm
Lead Channel Impedance Value: 1064 Ohm
Lead Channel Impedance Value: 1064 Ohm
Lead Channel Impedance Value: 456 Ohm
Lead Channel Impedance Value: 513 Ohm
Lead Channel Impedance Value: 513 Ohm
Lead Channel Impedance Value: 513 Ohm
Lead Channel Impedance Value: 646 Ohm
Lead Channel Impedance Value: 646 Ohm
Lead Channel Impedance Value: 646 Ohm
Lead Channel Impedance Value: 760 Ohm
Lead Channel Pacing Threshold Amplitude: 0.5 V
Lead Channel Pacing Threshold Amplitude: 0.625 V
Lead Channel Pacing Threshold Amplitude: 1.125 V
Lead Channel Pacing Threshold Pulse Width: 0.4 ms
Lead Channel Pacing Threshold Pulse Width: 0.4 ms
Lead Channel Pacing Threshold Pulse Width: 0.5 ms
Lead Channel Sensing Intrinsic Amplitude: 10.875 mV
Lead Channel Sensing Intrinsic Amplitude: 10.875 mV
Lead Channel Sensing Intrinsic Amplitude: 3.875 mV
Lead Channel Sensing Intrinsic Amplitude: 3.875 mV
Lead Channel Setting Pacing Amplitude: 1.25 V
Lead Channel Setting Pacing Amplitude: 1.5 V
Lead Channel Setting Pacing Amplitude: 2 V
Lead Channel Setting Pacing Pulse Width: 0.4 ms
Lead Channel Setting Pacing Pulse Width: 1 ms
Lead Channel Setting Sensing Sensitivity: 0.3 mV

## 2021-07-05 MED ORDER — POTASSIUM CHLORIDE CRYS ER 20 MEQ PO TBCR: 20.0000 meq | EXTENDED_RELEASE_TABLET | Freq: Every day | ORAL | 3 refills | Status: DC

## 2021-07-05 NOTE — Progress Notes (Unsigned)
EPIC Encounter for ICM Monitoring  Patient Name: [name] is a 71 y.o. female Date: 07/05/2021 Primary Care Physican: Marcine Matar, MD Primary Cardiologist: Shirlee Latch Electrophysiologist: Joycelyn Schmid Pacing: 97.0%         04/11/2021 Office Weight: 175 lbs 06/20/2021 Weight: 176 lbs                                                           Transmission reviewed.    Optivol thoracic impedance suggesting fluid levels have returned close to baseline on 6/20.  Impedance is below baseline normal for majority of days since 4/4.  Fluid index remains above normal threshold since 4/8.   Prescribed:  Furosemide 40 mg take 1 tablet (40 mg total) by mouth daily.  Spironolactone 25 mg take 1 tablet at bedtime.   Labs: 07/01/2021 Creatinine 1.9, BUN 22, Potassium 3.0, Sodium 139, GFR 37 04/29/2021 Creatinine 1.60, BUN 30, Potassium 4.0, Sodium 138 04/21/2021 Creatinine 1.85, BUN 30, Potassium 3.6, Sodium 137, GFR 29  04/11/2021 Creatinine 1.79, BUN 37, Potassium 5.2, Sodium 136, GFR 30 01/26/2021 Creatinine 1.14, BUN 16, Potassium 4.0, Sodium 133, GFR 51 01/19/2021 Creatinine 1.04, BUN 10, Potassium 3.4, Sodium 136, GFR 57  A complete set of results can be found in Results Review.   Recommendations:  No changes.    Follow-up plan: ICM clinic phone appointment on 07/13/2021 to recheck fluid levels.   91 day device clinic remote transmission 10/04/2021.   EP/Cardiology Office Visits:  08/08/2021 with HF clinic.  Recall 12/15/2020 with Dr Graciela Husbands    Copy of ICM check sent to Dr. Graciela Husbands.       3 month ICM trend: 07/13/2021.    12-14 Month ICM trend:     Karie Soda, RN 07/05/2021 1:39 PM

## 2021-07-05 NOTE — Telephone Encounter (Signed)
Patient informed of lab results.  Pt aware, agreeable, and verbalized understanding Labs scheduled and med list updated

## 2021-07-13 ENCOUNTER — Ambulatory Visit (INDEPENDENT_AMBULATORY_CARE_PROVIDER_SITE_OTHER): Payer: Self-pay

## 2021-07-13 DIAGNOSIS — I5022 Chronic systolic (congestive) heart failure: Secondary | ICD-10-CM

## 2021-07-13 DIAGNOSIS — Z95810 Presence of automatic (implantable) cardiac defibrillator: Secondary | ICD-10-CM

## 2021-07-14 ENCOUNTER — Ambulatory Visit (HOSPITAL_COMMUNITY)
Admission: RE | Admit: 2021-07-14 | Discharge: 2021-07-14 | Disposition: A | Discharge status: Home / Self Care | Payer: Medicare Other | Source: Ambulatory Visit | Attending: Internal Medicine | Admitting: Internal Medicine

## 2021-07-14 DIAGNOSIS — I5022 Chronic systolic (congestive) heart failure: Secondary | ICD-10-CM | POA: Diagnosis not present

## 2021-07-14 LAB — BASIC METABOLIC PANEL
Anion gap: 10 (ref 5–15)
BUN: 28 mg/dL — ABNORMAL HIGH (ref 8–23)
CO2: 22 mmol/L (ref 22–32)
Calcium: 9.3 mg/dL (ref 8.9–10.3)
Chloride: 104 mmol/L (ref 98–111)
Creatinine, Ser: 1.61 mg/dL — ABNORMAL HIGH (ref 0.44–1.00)
GFR, Estimated: 34 mL/min — ABNORMAL LOW (ref >60)
Glucose, Bld: 105 mg/dL — ABNORMAL HIGH (ref 70–99)
Potassium: 5 mmol/L (ref 3.5–5.1)
Sodium: 136 mmol/L (ref 135–145)

## 2021-07-15 NOTE — Progress Notes (Signed)
EPIC Encounter for ICM Monitoring  Patient Name: [name] is a 71 y.o. female Date: 07/15/2021 Primary Care Physican: Marcine Matar, MD Primary Cardiologist: Shirlee Latch Electrophysiologist: Joycelyn Schmid Pacing: 98.0%         04/11/2021 Office Weight: 175 lbs 06/20/2021 Weight: 176 lbs                                                           Spoke with husband and heart failure questions reviewed.  Pt asymptomatic for fluid accumulation.    Optivol thoracic impedance suggesting fluid levels returned to normal.   Prescribed:  Furosemide 40 mg take 1 tablet (40 mg total) by mouth daily.  Potassium 20 mEq take 1 tablet (20 mg total) by mouth daily Spironolactone 25 mg take 1 tablet at bedtime.   Labs: 07/14/2021 Creatinine 1.61, BUN 28, Potassium 5.0, Sodium 136, GFR 34 07/01/2021 Creatinine 1.9,   BUN 22, Potassium 3.0, Sodium 139, GFR 37 04/29/2021 Creatinine 1.60, BUN 30, Potassium 4.0, Sodium 138 04/21/2021 Creatinine 1.85, BUN 30, Potassium 3.6, Sodium 137, GFR 29  04/11/2021 Creatinine 1.79, BUN 37, Potassium 5.2, Sodium 136, GFR 30 01/26/2021 Creatinine 1.14, BUN 16, Potassium 4.0, Sodium 133, GFR 51 01/19/2021 Creatinine 1.04, BUN 10, Potassium 3.4, Sodium 136, GFR 57  A complete set of results can be found in Results Review.   Recommendations:  No changes and encouraged to call if experiencing any fluid symptoms.   Follow-up plan: ICM clinic phone appointment on 08/15/2021.   91 day device clinic remote transmission 10/04/2021.   EP/Cardiology Office Visits:  08/08/2021 with HF clinic.  Recall 12/15/2020 with Dr Graciela Husbands    Copy of ICM check sent to Dr. Graciela Husbands.     3 month ICM trend: 07/13/2021.    12-14 Month ICM trend:     Karie Soda, RN 07/15/2021 10:20 AM

## 2021-07-20 NOTE — Progress Notes (Signed)
Remote ICD transmission.   

## 2021-07-27 ENCOUNTER — Other Ambulatory Visit (HOSPITAL_COMMUNITY): Payer: Self-pay | Admitting: *Deleted

## 2021-07-27 MED ORDER — PANTOPRAZOLE SODIUM 40 MG PO TBEC: 40.0000 mg | DELAYED_RELEASE_TABLET | Freq: Every day | ORAL | 3 refills | Status: DC

## 2021-08-08 ENCOUNTER — Ambulatory Visit (HOSPITAL_COMMUNITY)
Admission: RE | Admit: 2021-08-08 | Discharge: 2021-08-08 | Disposition: A | Discharge status: Home / Self Care | Payer: Medicare Other | Source: Ambulatory Visit | Attending: Family Medicine | Admitting: Family Medicine

## 2021-08-08 ENCOUNTER — Telehealth (HOSPITAL_COMMUNITY): Payer: Self-pay | Admitting: Surgery

## 2021-08-08 ENCOUNTER — Encounter (HOSPITAL_COMMUNITY): Payer: Self-pay

## 2021-08-08 VITALS — BP 96/62 | HR 65 | Wt 175.2 lb

## 2021-08-08 DIAGNOSIS — I6340 Cerebral infarction due to embolism of unspecified cerebral artery: Secondary | ICD-10-CM | POA: Diagnosis not present

## 2021-08-08 DIAGNOSIS — Z8673 Personal history of transient ischemic attack (TIA), and cerebral infarction without residual deficits: Secondary | ICD-10-CM | POA: Diagnosis not present

## 2021-08-08 DIAGNOSIS — I447 Left bundle-branch block, unspecified: Secondary | ICD-10-CM | POA: Diagnosis not present

## 2021-08-08 DIAGNOSIS — I110 Hypertensive heart disease with heart failure: Secondary | ICD-10-CM | POA: Diagnosis not present

## 2021-08-08 DIAGNOSIS — I639 Cerebral infarction, unspecified: Secondary | ICD-10-CM | POA: Insufficient documentation

## 2021-08-08 DIAGNOSIS — E785 Hyperlipidemia, unspecified: Secondary | ICD-10-CM | POA: Diagnosis not present

## 2021-08-08 DIAGNOSIS — I428 Other cardiomyopathies: Secondary | ICD-10-CM | POA: Diagnosis not present

## 2021-08-08 DIAGNOSIS — Z7902 Long term (current) use of antithrombotics/antiplatelets: Secondary | ICD-10-CM | POA: Insufficient documentation

## 2021-08-08 DIAGNOSIS — I5022 Chronic systolic (congestive) heart failure: Secondary | ICD-10-CM | POA: Insufficient documentation

## 2021-08-08 LAB — BASIC METABOLIC PANEL
Anion gap: 9 (ref 5–15)
BUN: 52 mg/dL — ABNORMAL HIGH (ref 8–23)
CO2: 21 mmol/L — ABNORMAL LOW (ref 22–32)
Calcium: 9.3 mg/dL (ref 8.9–10.3)
Chloride: 105 mmol/L (ref 98–111)
Creatinine, Ser: 2.5 mg/dL — ABNORMAL HIGH (ref 0.44–1.00)
GFR, Estimated: 20 mL/min — ABNORMAL LOW (ref >60)
Glucose, Bld: 98 mg/dL (ref 70–99)
Potassium: 5.6 mmol/L — ABNORMAL HIGH (ref 3.5–5.1)
Sodium: 135 mmol/L (ref 135–145)

## 2021-08-08 MED ORDER — POTASSIUM CHLORIDE ER 10 MEQ PO TBCR: 40.0000 meq | EXTENDED_RELEASE_TABLET | Freq: Every day | ORAL | 6 refills | Status: DC

## 2021-08-08 NOTE — Telephone Encounter (Signed)
I attempted to reach patient to review results and recommendations per provider.  I left a message for a return call. 

## 2021-08-08 NOTE — Progress Notes (Signed)
Patient ID: [name], female   DOB: [birth year], 71 y.o.   MRN: [id number]     PCP: Marcine Matar, MD Neurologist: Dr Pearlean Brownie EP: Dr Graciela Husbands.  HF Cardiologist: Dr Shirlee Latch   HPI: Ms. Bostick is a 71 y.o. female with chronic systolic CHF with nonischemic cardiomypathy (no significant CAD) s/p Medtronic CRT-D, LBBB, mild-mod MR and CVA (09/2013).  Coronary angiography in 7/15 showed no significant CAD.  Admitted in 9/15 with right sided facial droop and expressive aphasia, CVA on MRI. Had TEE showing EF 15%.    QRS narrowed, so in 11/16 device was reprogrammed to VVI.   Echo in 12/17 showed EF up to 45-50%.  Echo in 3/19 showed EF 45-50% with moderate LVH and normal RV.   Echo was done in 4/21 showing EF decreased to 30-35% with LAD territory wall motion abnormalities.  LHC/RHC in 5/21 showed normal filling pressures and cardiac output, no significant CAD.  With persistent LBBB, CRT was activated by Dr. Graciela Husbands in 9/21.   Echo 3/23 showed EF 35-40%, diffuse hypokinesis, mildly decreased RV systolic function.   Seen in ED 4/23 with mechanical fall. CT head showed scalp  hematoma, no IC bleed or fracture.  Today she returns for HF follow up with her husband. Overall feeling fine. She has dyspnea walking further distances on flat ground, walks with a cane for balance. Denies palpitations, abnormal bleeding, CP, dizziness, edema, or PND/Orthopnea. Appetite ok. No fever or chills. Weight at home 175 pounds. Taking all medications.     Medtronic device interrogation: stable thoracic impedance, no AF/VT, 0.6 hr/day activity  Labs (2/21): K 3.8, creatinine 0.96, LDL 105  Labs (9/21): LDL 92 Labs (12/21): K 3.7, creatinine 0.87 Labs (4/22): LDL 66 Labs (8/22): K 3.9, creatinine 1.12 Labs (1/23): K 4, creatinine 1.14 Labs (3/23): LDL 71, HDL 34 Labs (6/23): K 5.0, creatinine 1.61  ECG (personally reviewed): none ordered.  PMH: 1. Nonischemic cardiomyopathy: Echo (7/15) with severe LV  dilation, EF 25% with diffuse hypokinesis, mild to moderate MR.  RHC/ LHC 08/08/13 with RA 4, PA 23/8, PCWP 11, CI 2.52; no CAD.  TEE (9/15) with EF 15-20%, severe global hypokinesis, no LV thrombus, moderate MR, normal RV.  Echo (10/2013): EF 25-30%, grade I DD, mild AI. Medtronic CRT-D (10/15).  Echo (2/16) with EF 25-30%, diffuse hypokinesis worse in the septum, grade I diastolic dysfunction, normal RV size and systolic function, mild-moderate MR.  - Echo (8/16) with EF 35-40%, diffuse hypokinesis, mild-moderate mitral regurgitation.  - Echo (12/17): EF 45-50%, mild LVH, mild to moderate MR.  - Echo (3/19): EF 45-50% with moderate LVH, mild MR, normal RV size and systolic function.  - Echo (4/21): EF 30-35% with LAD territory wall motion abnormalities. - LHC/RHC (5/21): No significant CAD; mean RA 6, PA 31/13, mean PCWP 13, CI 3.37.  - Echo (3/23): EF 35-40%, diffuse hypokinesis, mildly decreased RV systolic function. 2. CVA: 9/15 with dysarthria.  Carotids 9/15 with no significant CAD.  - Carotid dopplers (10/17): mild plaque, normal subclavians.  3. LBBB: Medtronic CRT-D (10/2013) 4. Hyperlipidemia 5. Cholelithiasis 6. Peripheral arterial dopplers (6/19): Normal  SH: lives with her son and husband. Does not drink or smoke. Currently not working.   FH: Mom had A fib and Heart Failure. Deceased 16        Sister has lupus  ROS: All systems negative except as listed in HPI, PMH and Problem List.   Current Outpatient Medications  Medication Sig Dispense Refill  albuterol (VENTOLIN HFA) 108 (90 Base) MCG/ACT inhaler Inhale 2 puffs into the lungs at bedtime as needed for wheezing or shortness of breath. 18 g 3   atorvastatin (LIPITOR) 80 MG tablet Take 1 tablet (80 mg total) by mouth daily at 6 PM. 90 tablet 3   carvedilol (COREG) 6.25 MG tablet Take 1 tablet (6.25 mg total) by mouth 2 (two) times daily with a meal. 180 tablet 3   cetirizine (ZYRTEC) 10 MG tablet Take 10 mg by mouth daily.      clopidogrel (PLAVIX) 75 MG tablet Take 1 tablet (75 mg total) by mouth daily. 90 tablet 3   dapagliflozin propanediol (FARXIGA) 10 MG TABS tablet Take 1 tablet (10 mg total) by mouth daily before breakfast. 90 tablet 3   fluticasone (FLONASE) 50 MCG/ACT nasal spray Place 2 sprays into both nostrils daily. 16 g 0   furosemide (LASIX) 40 MG tablet Take 1 tablet (40 mg total) by mouth daily. 180 tablet 3   pantoprazole (PROTONIX) 40 MG tablet Take 1 tablet (40 mg total) by mouth daily. 30 tablet 3   potassium chloride SA (KLOR-CON M) 20 MEQ tablet Take 40 mEq by mouth daily.     sacubitril-valsartan (ENTRESTO) 49-51 MG Take 1 tablet by mouth 2 (two) times daily. 180 tablet 3   spironolactone (ALDACTONE) 25 MG tablet Take 1 tablet (25 mg total) by mouth at bedtime. 90 tablet 3   No current facility-administered medications for this encounter.   BP 96/62   Pulse 65   Wt 79.5 kg (175 lb 3.2 oz)   SpO2 100%   BMI 29.15 kg/m   Wt Readings from Last 3 Encounters:  08/08/21 79.5 kg (175 lb 3.2 oz)  05/24/21 80.3 kg (177 lb)  04/29/21 79.4 kg (175 lb)   PHYSICAL EXAM: General:  NAD. No resp difficulty, walked into clinic with cane HEENT: Normal Neck: Supple. No JVD. Carotids 2+ bilat; no bruits. No lymphadenopathy or thryomegaly appreciated. Cor: PMI nondisplaced. Regular rate & rhythm. No rubs, gallops or murmurs. Lungs: Clear Abdomen: Soft, nontender, nondistended. No hepatosplenomegaly. No bruits or masses. Good bowel sounds. Extremities: No cyanosis, clubbing, rash, edema Neuro: Alert & oriented x 3, cranial nerves grossly intact. Moves all 4 extremities w/o difficulty. Affect pleasant.  ASSESSMENT & PLAN: 1. Chronic Systolic Heart Failure: Nonischemic cardiomyopathy s/p Medtronic CRT-D in 10/15, coronary angiography without CAD in 07/2013. Echo done in 2/16, EF 25-30%.  Repeat echo in 8/16 with EF up to 35-40%.  Echo in 4/19 showed EF up to 45-50%.  However, echo in 4/21 showed EF  back down to 30-35%.  LHC/RHC in 5/21 showed no CAD, normal filling pressures, preserved cardiac output.  Of note, Medtronic device had been reprogrammed to VVI as LBBB has been only intermittent => more recently, LBBB has been persistent and CRT was reactivated in 9/21. Echo 3/23 showed EF 35-40%, diffuse hypokinesis, mildly decreased RV systolic function.  NYHA class II symptoms. She is not volume overloaded by exam or Optivol.     - Continue Coreg 6.25 mg bid, she did not tolerate increase to 9.375 mg bid.   - Continue Entresto 49/51 bid, no BP room to increase.  BMET today.    - Continue Lasix 40 mg daily. - Continue spironolactone 25 mg daily.  - Continue dapagliflozin 10 mg daily.   2. CVA: ?Embolic.  No atrial fibrillation has been detected so far on device.  She will continue Plavix for now. If future device interrogation shows  atrial fibrillation, she will need to be anticoagulated.   3. Hyperlipidemia: Goal LDL < 70 with history of CVA.  Continue statin.  - Good lipids 3/23.  Follow up in 4 months with Dr. Shirlee Latch.  Anderson Malta [facility] FNP-BC 08/08/2021

## 2021-08-08 NOTE — Telephone Encounter (Signed)
-----   Message from Jacklynn Ganong, [place] sent at 08/08/2021  1:48 PM EDT ----- K and kidney function elevated.  Stop KCL supplementation. Stop Lasix, Entresto and spiro x 2 days, then resume Entresto 49/51 bid and Lasix 40 mg daily. Stay off spiro until further directed after repeat BMET on Wednesday or Thursday.

## 2021-08-08 NOTE — Patient Instructions (Signed)
Thank you for coming in today  Labs were done today, if any labs are abnormal the clinic will call you No news is good news  Your physician recommends that you schedule a follow-up appointment in:  4 month with Dr. Shirlee Latch     Do the following things EVERYDAY: Weigh yourself in the morning before breakfast. Write it down and keep it in a log. Take your medicines as prescribed Eat low salt foods--Limit salt (sodium) to 2000 mg per day.  Stay as active as you can everyday Limit all fluids for the day to less than 2 liters  At the [facility], you and your health needs are our priority. As part of our continuing mission to provide you with exceptional heart care, we have created designated Provider Care Teams. These Care Teams include your primary Cardiologist (physician) and Advanced Practice Providers (APPs- Physician Assistants and Nurse Practitioners) who all work together to provide you with the care you need, when you need it.   You may see any of the following providers on your designated Care Team at your next follow up: Dr Arvilla Meres Dr Carron Curie, NP Robbie Lis, [place] [facility], [place] Karle Plumber, PharmD   Please be sure to bring in all your medications bottles to every appointment.   If you have any questions or concerns before your next appointment please send [place] a message through [place] or call our office at [phone].    TO LEAVE A MESSAGE FOR THE NURSE SELECT OPTION 2, PLEASE LEAVE A MESSAGE INCLUDING: YOUR NAME DATE OF BIRTH CALL BACK NUMBER REASON FOR CALL**this is important as we prioritize the call backs  YOU WILL RECEIVE A CALL BACK THE SAME DAY AS LONG AS YOU CALL BEFORE 4:00 PM

## 2021-08-09 ENCOUNTER — Telehealth (HOSPITAL_COMMUNITY): Payer: Self-pay | Admitting: Surgery

## 2021-08-09 DIAGNOSIS — I5022 Chronic systolic (congestive) heart failure: Secondary | ICD-10-CM

## 2021-08-09 NOTE — Telephone Encounter (Signed)
Ms. Franca called back and then I returned her call. I reviewed results and recommendations per provider with her husband. Patient took medications this AM already and will hold Wednesday and Thursday.  I have updated medlist in [facility] and entered lab orders and appt for Friday.

## 2021-08-11 ENCOUNTER — Ambulatory Visit (HOSPITAL_COMMUNITY)
Admission: RE | Admit: 2021-08-11 | Discharge: 2021-08-11 | Disposition: A | Discharge status: Home / Self Care | Payer: Medicare Other | Source: Ambulatory Visit | Attending: Cardiology | Admitting: Cardiology

## 2021-08-11 DIAGNOSIS — I5022 Chronic systolic (congestive) heart failure: Secondary | ICD-10-CM | POA: Insufficient documentation

## 2021-08-11 LAB — BASIC METABOLIC PANEL
Anion gap: 7 (ref 5–15)
BUN: 27 mg/dL — ABNORMAL HIGH (ref 8–23)
CO2: 22 mmol/L (ref 22–32)
Calcium: 9.1 mg/dL (ref 8.9–10.3)
Chloride: 108 mmol/L (ref 98–111)
Creatinine, Ser: 1.29 mg/dL — ABNORMAL HIGH (ref 0.44–1.00)
GFR, Estimated: 44 mL/min — ABNORMAL LOW (ref >60)
Glucose, Bld: 103 mg/dL — ABNORMAL HIGH (ref 70–99)
Potassium: 4.4 mmol/L (ref 3.5–5.1)
Sodium: 137 mmol/L (ref 135–145)

## 2021-08-12 ENCOUNTER — Other Ambulatory Visit (HOSPITAL_COMMUNITY): Payer: Medicare Other

## 2021-08-15 ENCOUNTER — Ambulatory Visit (INDEPENDENT_AMBULATORY_CARE_PROVIDER_SITE_OTHER): Payer: Medicare Other

## 2021-08-15 DIAGNOSIS — I5022 Chronic systolic (congestive) heart failure: Secondary | ICD-10-CM | POA: Diagnosis not present

## 2021-08-15 DIAGNOSIS — Z95810 Presence of automatic (implantable) cardiac defibrillator: Secondary | ICD-10-CM | POA: Diagnosis not present

## 2021-08-16 MED ORDER — SPIRONOLACTONE 25 MG PO TABS: 25.0000 mg | ORAL_TABLET | Freq: Every day | ORAL | 2 refills | Status: DC

## 2021-08-16 NOTE — Progress Notes (Signed)
Spoke with patient and advised Danielle Rome NP at HF clinic recommended to restart Spironolactone 25 mg daily.  She asked if she should restart potassium and advised no.  The only change is starting Spironolactone  BMET needed and scheduled on 8/11 (same time husband has office visit with HF clinic) at 11:00 AM.  Advised to call back with any questions.

## 2021-08-16 NOTE — Progress Notes (Signed)
Attempted call to husband patient and unable to reach.  Left message and will try back later.

## 2021-08-16 NOTE — Progress Notes (Signed)
EPIC Encounter for ICM Monitoring  Patient Name: [name] is a 71 y.o. female Date: 08/16/2021 Primary Care Physican: Marcine Matar, MD Primary Cardiologist: Shirlee Latch Electrophysiologist: Joycelyn Schmid Pacing: 96.7%         04/11/2021 Office Weight: 175 lbs 06/20/2021 Weight: 176 lbs  08/16/2021 Weight: 174 lbs                                                          Spoke with husband and heart failure questions reviewed.  Pt asymptomatic for fluid accumulation at this time.  Furosemide stopped 7/25 x 2 days due to elevated kidney function per HF clinic.     Optivol thoracic impedance suggesting possible fluid accumulation starting 7/25 which correlates with Lasix being held for 2 days on 7/25 but no improvement after resuming 40 mg daily on 7/29.   Prescribed:  Furosemide 40 mg take 1 tablet (40 mg total) by mouth daily.  Potassium 20 mEq take 1 tablet (20 mg total) by mouth daily Spironolactone 25 mg take 1 tablet at bedtime.   Labs: 08/11/2021 Creatinine 1.29, BUN 27, Potassium 4.4, Sodium 137, GFR 44 08/08/2021 Creatinine 2.50, BUN 52, Potassium 5.6, Sodium 135, GFR 20 (Lasix and Entresto held for 2 days starting 7/25, Spironolactone discontinued) 07/14/2021 Creatinine 1.61, BUN 28, Potassium 5.0, Sodium 136, GFR 34 07/01/2021 Creatinine 1.9,   BUN 22, Potassium 3.0, Sodium 139, GFR 37 04/29/2021 Creatinine 1.60, BUN 30, Potassium 4.0, Sodium 138 04/21/2021 Creatinine 1.85, BUN 30, Potassium 3.6, Sodium 137, GFR 29  04/11/2021 Creatinine 1.79, BUN 37, Potassium 5.2, Sodium 136, GFR 30 01/26/2021 Creatinine 1.14, BUN 16, Potassium 4.0, Sodium 133, GFR 51 01/19/2021 Creatinine 1.04, BUN 10, Potassium 3.4, Sodium 136, GFR 57  A complete set of results can be found in Results Review.   Recommendations:  Copy sent to Prince Rome, NP for review and recommendations.   She resumed 40 mg Lasix daily on 7/29.     Follow-up plan: ICM clinic phone appointment on 08/22/2021 to recheck  fluid levels.   91 day device clinic remote transmission 10/04/2021.   EP/Cardiology Office Visits:  Recall 12/06/2021 with Dr Shirlee Latch.  Recall 12/15/2020 with Dr Graciela Husbands    Copy of ICM check sent to Dr. Graciela Husbands.     3 month ICM trend: 08/16/2021.    12-14 Month ICM trend:     Karie Soda, RN 08/16/2021 1:07 PM

## 2021-08-16 NOTE — Progress Notes (Signed)
[place], Anderson Malta, FNP  Kirti Carl, Josephine Igo, RN OK to restart spiro 25 mg daily. She will need repeat BMET in 7-10 days please

## 2021-08-22 ENCOUNTER — Ambulatory Visit (INDEPENDENT_AMBULATORY_CARE_PROVIDER_SITE_OTHER): Payer: Medicare Other

## 2021-08-22 DIAGNOSIS — Z95810 Presence of automatic (implantable) cardiac defibrillator: Secondary | ICD-10-CM

## 2021-08-22 DIAGNOSIS — I5022 Chronic systolic (congestive) heart failure: Secondary | ICD-10-CM

## 2021-08-23 MED ORDER — FUROSEMIDE 40 MG PO TABS: ORAL_TABLET | ORAL | 3 refills | Status: DC

## 2021-08-23 NOTE — Progress Notes (Signed)
Resume Lasix 40 qam/20 qpm after 1 week of 40 mg bid, will need BMET 1 week.

## 2021-08-23 NOTE — Progress Notes (Signed)
EPIC Encounter for ICM Monitoring  Patient Name: [name] is a 71 y.o. female Date: 08/23/2021 Primary Care Physican: Marcine Matar, MD Primary Cardiologist: Shirlee Latch Electrophysiologist: Joycelyn Schmid Pacing: 96.2%         04/11/2021 Office Weight: 175 lbs 06/20/2021 Weight: 176 lbs  08/16/2021 Weight: 174 lbs 08/22/2021 Weight: 175 lbs                                                          Spoke with husband and heart failure questions reviewed.  Pt is asymptomatic for fluid accumulation, weight stable, no SOB or swelling.  He reports patient had oyster stew last night.     Optivol thoracic impedance suggesting possible fluid accumulation starting 7/25 which correlates with Lasix being held for 2 days on 7/25 but no improvement after resuming 40 mg daily on 7/29.  Spironolactone restarted 8/1   Prescribed:  Furosemide 40 mg take 1 tablet (40 mg total) by mouth daily.  Spironolactone 25 mg take 1 tablet by mouth daily (Restarted 8/1)   Labs: 08/11/2021 Creatinine 1.29, BUN 27, Potassium 4.4, Sodium 137, GFR 44 08/08/2021 Creatinine 2.50, BUN 52, Potassium 5.6, Sodium 135, GFR 20 (Lasix and Entresto held for 2 days starting 7/25, Spironolactone discontinued) 07/14/2021 Creatinine 1.61, BUN 28, Potassium 5.0, Sodium 136, GFR 34 07/01/2021 Creatinine 1.9,   BUN 22, Potassium 3.0, Sodium 139, GFR 37 04/29/2021 Creatinine 1.60, BUN 30, Potassium 4.0, Sodium 138 04/21/2021 Creatinine 1.85, BUN 30, Potassium 3.6, Sodium 137, GFR 29  04/11/2021 Creatinine 1.79, BUN 37, Potassium 5.2, Sodium 136, GFR 30 01/26/2021 Creatinine 1.14, BUN 16, Potassium 4.0, Sodium 133, GFR 51 01/19/2021 Creatinine 1.04, BUN 10, Potassium 3.4, Sodium 136, GFR 57  A complete set of results can be found in Results Review.   Recommendations:  Husband adjusted Furosemide and increased dosage to 40 mg bid starting 8/7 and planning on that dosage for a week.   Will send copy to Dr Shirlee Latch for review and advise husbands  plan for resolving fluid.     Follow-up plan: ICM clinic phone appointment on 08/29/2021 to recheck fluid levels.   91 day device clinic remote transmission 10/04/2021.   EP/Cardiology Office Visits:  Recall 12/06/2021 with Dr Shirlee Latch.  Recall 12/15/2020 with Dr Graciela Husbands    Copy of ICM check sent to Dr. Graciela Husbands.      3 month ICM trend: 08/22/2021.    12-14 Month ICM trend:     Karie Soda, RN 08/23/2021 9:54 AM

## 2021-08-23 NOTE — Progress Notes (Signed)
Spoke with husband and advised Dr Shirlee Latch recommended to resume Lasix 40 qam/20 qpm after 1 week of 40 mg bid, will need BMET 1 week.   He repeated instructions back correctly and agreed with plan.  BMET scheduled 8/11.

## 2021-08-26 ENCOUNTER — Ambulatory Visit (HOSPITAL_COMMUNITY)
Admission: RE | Admit: 2021-08-26 | Discharge: 2021-08-26 | Disposition: A | Discharge status: Home / Self Care | Payer: Medicare Other | Source: Ambulatory Visit | Attending: Cardiology | Admitting: Cardiology

## 2021-08-26 DIAGNOSIS — I5022 Chronic systolic (congestive) heart failure: Secondary | ICD-10-CM | POA: Insufficient documentation

## 2021-08-26 LAB — BASIC METABOLIC PANEL
Anion gap: 10 (ref 5–15)
BUN: 18 mg/dL (ref 8–23)
CO2: 25 mmol/L (ref 22–32)
Calcium: 9.2 mg/dL (ref 8.9–10.3)
Chloride: 106 mmol/L (ref 98–111)
Creatinine, Ser: 1.37 mg/dL — ABNORMAL HIGH (ref 0.44–1.00)
GFR, Estimated: 41 mL/min — ABNORMAL LOW (ref >60)
Glucose, Bld: 105 mg/dL — ABNORMAL HIGH (ref 70–99)
Potassium: 4 mmol/L (ref 3.5–5.1)
Sodium: 141 mmol/L (ref 135–145)

## 2021-08-29 ENCOUNTER — Ambulatory Visit (INDEPENDENT_AMBULATORY_CARE_PROVIDER_SITE_OTHER): Payer: Medicare Other

## 2021-08-29 DIAGNOSIS — Z95810 Presence of automatic (implantable) cardiac defibrillator: Secondary | ICD-10-CM

## 2021-08-29 DIAGNOSIS — I5022 Chronic systolic (congestive) heart failure: Secondary | ICD-10-CM

## 2021-08-30 NOTE — Progress Notes (Signed)
EPIC Encounter for ICM Monitoring  Patient Name: Danielle FICEK is a 71 y.o. female Date: 08/30/2021 Primary Care Physican: Marcine Matar, MD Primary Cardiologist: Shirlee Latch Electrophysiologist: Joycelyn Schmid Pacing: 97.3%         04/11/2021 Office Weight: 175 lbs 06/20/2021 Weight: 176 lbs  08/16/2021 Weight: 174 lbs 08/22/2021 Weight: 175 lbs                                                          Spoke with husband and heart failure questions reviewed.  Pt asymptomatic for fluid accumulation.  Reports feeling well at this time and voices no complaints.      Optivol thoracic impedance suggesting fluid levels returned to normal on 8/14 after taking Lasix 40 mg bid x 1 week and long term dosage changes to 40 AM and 20 PM   Prescribed:  Furosemide 40 mg take 1 tablet (40 mg total) by mouth every morning and 0.5 tablet (20 mg total) every evening  Spironolactone 25 mg take 1 tablet by mouth daily (Restarted 8/1)   Labs: 08/26/2021 Creatinine 1.37, BUN 18, Potassium 4.0, Sodium 141, GFR 41 08/11/2021 Creatinine 1.29, BUN 27, Potassium 4.4, Sodium 137, GFR 44 08/08/2021 Creatinine 2.50, BUN 52, Potassium 5.6, Sodium 135, GFR 20 (Lasix and Entresto held for 2 days starting 7/25, Spironolactone discontinued) 07/14/2021 Creatinine 1.61, BUN 28, Potassium 5.0, Sodium 136, GFR 34 07/01/2021 Creatinine 1.9,   BUN 22, Potassium 3.0, Sodium 139, GFR 37 04/29/2021 Creatinine 1.60, BUN 30, Potassium 4.0, Sodium 138 04/21/2021 Creatinine 1.85, BUN 30, Potassium 3.6, Sodium 137, GFR 29  04/11/2021 Creatinine 1.79, BUN 37, Potassium 5.2, Sodium 136, GFR 30 01/26/2021 Creatinine 1.14, BUN 16, Potassium 4.0, Sodium 133, GFR 51 01/19/2021 Creatinine 1.04, BUN 10, Potassium 3.4, Sodium 136, GFR 57  A complete set of results can be found in Results Review.   Recommendations:  No changes and encouraged to call if experiencing any fluid symptoms.   Follow-up plan: ICM clinic phone appointment on 09/26/2021.    91 day device clinic remote transmission 10/04/2021.   EP/Cardiology Office Visits:  Advised to call and make November appointments with Dr Graciela Husbands and Dr Shirlee Latch.  Recall 12/06/2021 with Dr Shirlee Latch.  Recall 12/15/2020 with Dr Graciela Husbands    Copy of ICM check sent to Dr. Graciela Husbands.     3 month ICM trend: 08/29/2021.    12-14 Month ICM trend:     Karie Soda, RN 08/30/2021 1:57 PM

## 2021-09-01 ENCOUNTER — Other Ambulatory Visit: Payer: Self-pay | Admitting: Critical Care Medicine

## 2021-09-01 DIAGNOSIS — N1831 Chronic kidney disease, stage 3a: Secondary | ICD-10-CM

## 2021-09-02 ENCOUNTER — Other Ambulatory Visit: Payer: Self-pay | Admitting: Cardiology

## 2021-09-05 ENCOUNTER — Other Ambulatory Visit: Payer: Self-pay | Admitting: Internal Medicine

## 2021-09-05 DIAGNOSIS — N1831 Chronic kidney disease, stage 3a: Secondary | ICD-10-CM

## 2021-09-06 NOTE — Telephone Encounter (Signed)
Requested medication (s) are due for refill today: no  Requested medication (s) are on the active medication list: yes  Last refill:  09/01/21  Future visit scheduled: yes  Notes to clinic:  Unable to refill per protocol, last refill by provider 09/01/21 for 90 days.Possible duplicate request.       Requested Prescriptions  Pending Prescriptions Disp Refills   FARXIGA 10 MG TABS tablet [Pharmacy Med Name: Wilder Glade 10 mg tablet] 90 tablet 0    Sig: TAKE ONE TABLET BY MOUTH EVERY DAY     Endocrinology:  Diabetes - SGLT2 Inhibitors Failed - 09/05/2021  9:27 AM      Failed - Cr in normal range and within 360 days    Creat  Date Value Ref Range Status  04/08/2014 0.73 0.50 - 1.10 mg/dL Final   Creatinine, Ser  Date Value Ref Range Status  08/26/2021 1.37 (H) 0.44 - 1.00 mg/dL Final         Failed - HBA1C is between 0 and 7.9 and within 180 days    Hgb A1c MFr Bld  Date Value Ref Range Status  10/19/2017 5.3 4.8 - 5.6 % Final    Comment:             Prediabetes: 5.7 - 6.4          Diabetes: >6.4          Glycemic control for adults with diabetes: <7.0          Failed - eGFR in normal range and within 360 days    GFR, Est African American  Date Value Ref Range Status  04/09/2013 >89 mL/min Final   GFR calc Af Amer  Date Value Ref Range Status  10/15/2019 >60 >60 mL/min Final   GFR, Est Non African American  Date Value Ref Range Status  04/09/2013 >89 mL/min Final    Comment:      The estimated GFR is a calculation valid for adults (>=29 years old) that uses the CKD-EPI algorithm to adjust for age and sex. It is   not to be used for children, pregnant women, hospitalized patients,    patients on dialysis, or with rapidly changing kidney function. According to the NKDEP, eGFR >89 is normal, 60-89 shows mild impairment, 30-59 shows moderate impairment, 15-29 shows severe impairment and <15 is ESRD.     GFR, Estimated  Date Value Ref Range Status  08/26/2021 41 (L)  >60 mL/min Final    Comment:    (NOTE) Calculated using the CKD-EPI Creatinine Equation (2021)    GFR  Date Value Ref Range Status  11/03/2013 78.82 >60.00 mL/min Final         Passed - Valid encounter within last 6 months    Recent Outpatient Visits           3 months ago Intractable acute post-traumatic headache   [place], MD   6 months ago Establishing care with new doctor, encounter for   [place], MD   8 years ago Preventative health care   [place] Robbie Lis, MD       Future Appointments             In 1 month Joya Gaskins Burnett Harry, MD [place]   In 3 months Deboraha Sprang, MD [place], [facility]

## 2021-09-12 ENCOUNTER — Other Ambulatory Visit (HOSPITAL_COMMUNITY): Payer: Self-pay | Admitting: Internal Medicine

## 2021-09-12 DIAGNOSIS — I6340 Cerebral infarction due to embolism of unspecified cerebral artery: Secondary | ICD-10-CM

## 2021-09-26 ENCOUNTER — Ambulatory Visit (INDEPENDENT_AMBULATORY_CARE_PROVIDER_SITE_OTHER): Payer: Medicare Other

## 2021-09-26 ENCOUNTER — Telehealth: Payer: Self-pay

## 2021-09-26 DIAGNOSIS — Z95810 Presence of automatic (implantable) cardiac defibrillator: Secondary | ICD-10-CM

## 2021-09-26 DIAGNOSIS — I5022 Chronic systolic (congestive) heart failure: Secondary | ICD-10-CM | POA: Diagnosis not present

## 2021-09-26 NOTE — Progress Notes (Unsigned)
Spoke with patient and heart failure questions reviewed.  Pt asymptomatic for fluid accumulation.  Reports feeling well at this time and voices no complaints.  Transmission results reviewed.  She is feeling fine.  Weight stable at 176 lbs.    Confirmed she is taking Lasix 40 mg AM and 20 mg PM  Advised will send copy to Dr Shirlee Latch for review and recommendations.

## 2021-09-26 NOTE — Telephone Encounter (Signed)
Remote ICM transmission received.  Attempted call to husband/patient regarding ICM remote transmission and left detailed message per DPR.  Advised to return call for any fluid symptoms or questions. Next ICM remote transmission scheduled 10/03/2021.

## 2021-09-26 NOTE — Progress Notes (Unsigned)
EPIC Encounter for ICM Monitoring  Patient Name: [name] is a 71 y.o. female Date: 09/26/2021 Primary Care Physican: Marcine Matar, MD Primary Cardiologist: Shirlee Latch Electrophysiologist: Joycelyn Schmid Pacing: 97.1%         04/11/2021 Office Weight: 175 lbs 06/20/2021 Weight: 176 lbs  08/16/2021 Weight: 174 lbs 08/22/2021 Weight: 175 lbs                                                          Attempted call to husband/patient and unable to reach.  Left detailed message per DPR regarding transmission. Transmission reviewed.      Optivol thoracic impedance suggesting possible fluid accumulation starting 8/20.  Fluid index crossing normal threshold.    Prescribed:  Furosemide 40 mg take 1 tablet (40 mg total) by mouth every morning and 0.5 tablet (20 mg total) every evening  Spironolactone 25 mg take 1 tablet by mouth daily (Restarted 8/1)   Labs: 08/26/2021 Creatinine 1.37, BUN 18, Potassium 4.0, Sodium 141, GFR 41 08/11/2021 Creatinine 1.29, BUN 27, Potassium 4.4, Sodium 137, GFR 44 08/08/2021 Creatinine 2.50, BUN 52, Potassium 5.6, Sodium 135, GFR 20 (Lasix and Entresto held for 2 days starting 7/25, Spironolactone discontinued) 07/14/2021 Creatinine 1.61, BUN 28, Potassium 5.0, Sodium 136, GFR 34 07/01/2021 Creatinine 1.9,   BUN 22, Potassium 3.0, Sodium 139, GFR 37 04/29/2021 Creatinine 1.60, BUN 30, Potassium 4.0, Sodium 138 04/21/2021 Creatinine 1.85, BUN 30, Potassium 3.6, Sodium 137, GFR 29  04/11/2021 Creatinine 1.79, BUN 37, Potassium 5.2, Sodium 136, GFR 30 01/26/2021 Creatinine 1.14, BUN 16, Potassium 4.0, Sodium 133, GFR 51 01/19/2021 Creatinine 1.04, BUN 10, Potassium 3.4, Sodium 136, GFR 57  A complete set of results can be found in Results Review.   Recommendations:  Left voice mail with ICM number and encouraged to call if experiencing any fluid symptoms.     Follow-up plan: ICM clinic phone appointment on 10/03/2021 to recheck fluid levels.   91 day device clinic  remote transmission 10/04/2021.   EP/Cardiology Office Visits:   Recall 12/06/2021 with Dr Shirlee Latch.  12/28/2021 with Dr Graciela Husbands    Copy of ICM check sent to Dr. Graciela Husbands.   Will send copy to Dr Shirlee Latch for review if patient is reached.    3 month ICM trend: 09/26/2021.    12-14 Month ICM trend:     Karie Soda, RN 09/26/2021 8:28 AM

## 2021-09-27 MED ORDER — FUROSEMIDE 40 MG PO TABS: 40.0000 mg | ORAL_TABLET | Freq: Two times a day (BID) | ORAL | 3 refills | Status: DC

## 2021-09-27 NOTE — Progress Notes (Signed)
Spoke with patient and advised Dr Shirlee Latch increased Furosemide to 40 mg twice a day.  She verbalized understanding.   Will need BMET in a week and appt scheduled for 9/20.  She requires a Furosemide refill to be sent to [facility].  Advised if any questions to call back.

## 2021-09-27 NOTE — Progress Notes (Signed)
Attempted call to patient and unable to reach.  Left message to return call.  

## 2021-09-27 NOTE — Progress Notes (Signed)
Increase Lasix to 40 mg bid. BMET 1 week.

## 2021-09-29 ENCOUNTER — Other Ambulatory Visit: Payer: Self-pay | Admitting: Critical Care Medicine

## 2021-10-03 ENCOUNTER — Ambulatory Visit (INDEPENDENT_AMBULATORY_CARE_PROVIDER_SITE_OTHER): Payer: Medicare Other

## 2021-10-03 DIAGNOSIS — Z95810 Presence of automatic (implantable) cardiac defibrillator: Secondary | ICD-10-CM

## 2021-10-03 DIAGNOSIS — I5022 Chronic systolic (congestive) heart failure: Secondary | ICD-10-CM

## 2021-10-04 ENCOUNTER — Ambulatory Visit (INDEPENDENT_AMBULATORY_CARE_PROVIDER_SITE_OTHER): Payer: Medicare Other

## 2021-10-04 DIAGNOSIS — I428 Other cardiomyopathies: Secondary | ICD-10-CM

## 2021-10-04 LAB — CUP PACEART REMOTE DEVICE CHECK
Battery Remaining Longevity: 9 mo
Battery Voltage: 2.86 V
Brady Statistic AP VP Percent: 0 %
Brady Statistic AP VS Percent: 0 %
Brady Statistic AS VP Percent: 98.13 %
Brady Statistic AS VS Percent: 1.87 %
Brady Statistic RA Percent Paced: 0 %
Brady Statistic RV Percent Paced: 0 %
Date Time Interrogation Session: 20230918192206
HighPow Impedance: 81 Ohm
Implantable Lead Implant Date: 20151021
Implantable Lead Implant Date: 20151021
Implantable Lead Implant Date: 20151021
Implantable Lead Location: 753858
Implantable Lead Location: 753859
Implantable Lead Location: 753860
Implantable Lead Model: 4298
Implantable Lead Model: 5076
Implantable Pulse Generator Implant Date: 20151021
Lead Channel Impedance Value: 1007 Ohm
Lead Channel Impedance Value: 1026 Ohm
Lead Channel Impedance Value: 456 Ohm
Lead Channel Impedance Value: 475 Ohm
Lead Channel Impedance Value: 513 Ohm
Lead Channel Impedance Value: 532 Ohm
Lead Channel Impedance Value: 551 Ohm
Lead Channel Impedance Value: 608 Ohm
Lead Channel Impedance Value: 646 Ohm
Lead Channel Impedance Value: 760 Ohm
Lead Channel Impedance Value: 931 Ohm
Lead Channel Impedance Value: 950 Ohm
Lead Channel Impedance Value: 988 Ohm
Lead Channel Pacing Threshold Amplitude: 0.5 V
Lead Channel Pacing Threshold Amplitude: 0.625 V
Lead Channel Pacing Threshold Amplitude: 1.125 V
Lead Channel Pacing Threshold Pulse Width: 0.4 ms
Lead Channel Pacing Threshold Pulse Width: 0.4 ms
Lead Channel Pacing Threshold Pulse Width: 0.5 ms
Lead Channel Sensing Intrinsic Amplitude: 17.375 mV
Lead Channel Sensing Intrinsic Amplitude: 17.375 mV
Lead Channel Sensing Intrinsic Amplitude: 3.875 mV
Lead Channel Sensing Intrinsic Amplitude: 3.875 mV
Lead Channel Setting Pacing Amplitude: 1.25 V
Lead Channel Setting Pacing Amplitude: 1.5 V
Lead Channel Setting Pacing Amplitude: 2 V
Lead Channel Setting Pacing Pulse Width: 0.4 ms
Lead Channel Setting Pacing Pulse Width: 1 ms
Lead Channel Setting Sensing Sensitivity: 0.3 mV

## 2021-10-05 ENCOUNTER — Telehealth: Payer: Self-pay

## 2021-10-05 ENCOUNTER — Ambulatory Visit (HOSPITAL_COMMUNITY)
Admission: RE | Admit: 2021-10-05 | Discharge: 2021-10-05 | Disposition: A | Discharge status: Home / Self Care | Payer: Medicare Other | Source: Ambulatory Visit | Attending: Cardiology | Admitting: Cardiology

## 2021-10-05 DIAGNOSIS — I5022 Chronic systolic (congestive) heart failure: Secondary | ICD-10-CM | POA: Insufficient documentation

## 2021-10-05 LAB — BASIC METABOLIC PANEL
Anion gap: 9 (ref 5–15)
BUN: 14 mg/dL (ref 8–23)
CO2: 25 mmol/L (ref 22–32)
Calcium: 9.1 mg/dL (ref 8.9–10.3)
Chloride: 106 mmol/L (ref 98–111)
Creatinine, Ser: 1.26 mg/dL — ABNORMAL HIGH (ref 0.44–1.00)
GFR, Estimated: 45 mL/min — ABNORMAL LOW (ref >60)
Glucose, Bld: 114 mg/dL — ABNORMAL HIGH (ref 70–99)
Potassium: 3.1 mmol/L — ABNORMAL LOW (ref 3.5–5.1)
Sodium: 140 mmol/L (ref 135–145)

## 2021-10-05 NOTE — Telephone Encounter (Signed)
Remote ICM transmission received.  Attempted call to patient regarding ICM remote transmission and left detailed message per DPR.  Advised to return call for any fluid symptoms or questions. Next ICM remote transmission scheduled 11/07/2021.    

## 2021-10-05 NOTE — Progress Notes (Signed)
EPIC Encounter for ICM Monitoring  Patient Name: [name] is a 71 y.o. female Date: 10/05/2021 Primary Care Physican: Ladell Pier, MD Primary Cardiologist: Aundra Dubin Electrophysiologist: Vergie Living Pacing: 97.3%         04/11/2021 Office Weight: 175 lbs 06/20/2021 Weight: 176 lbs  08/16/2021 Weight: 174 lbs 08/22/2021 Weight: 175 lbs                                                          Attempted call to husband/patient and unable to reach.  Left detailed message per DPR regarding transmission. Transmission reviewed.      Optivol thoracic impedance suggesting fluid levels returned to normal after Furosemide increased to Furosemide 40 mg bid.    Prescribed:  Furosemide 40 mg take 1 tablet (40 mg total) by mouth twice a day  Spironolactone 25 mg take 1 tablet by mouth daily (Restarted 8/1)   Labs: 10/05/2021 Creatinine 1.26, BUN 14, Potassium 3.1, Sodium 140, GFR 45 08/26/2021 Creatinine 1.37, BUN 18, Potassium 4.0, Sodium 141, GFR 41 08/11/2021 Creatinine 1.29, BUN 27, Potassium 4.4, Sodium 137, GFR 44 08/08/2021 Creatinine 2.50, BUN 52, Potassium 5.6, Sodium 135, GFR 20 (Lasix and Entresto held for 2 days starting 7/25, Spironolactone discontinued) 07/14/2021 Creatinine 1.61, BUN 28, Potassium 5.0, Sodium 136, GFR 34 07/01/2021 Creatinine 1.9,   BUN 22, Potassium 3.0, Sodium 139, GFR 37 04/29/2021 Creatinine 1.60, BUN 30, Potassium 4.0, Sodium 138 04/21/2021 Creatinine 1.85, BUN 30, Potassium 3.6, Sodium 137, GFR 29  04/11/2021 Creatinine 1.79, BUN 37, Potassium 5.2, Sodium 136, GFR 30 01/26/2021 Creatinine 1.14, BUN 16, Potassium 4.0, Sodium 133, GFR 51 01/19/2021 Creatinine 1.04, BUN 10, Potassium 3.4, Sodium 136, GFR 57  A complete set of results can be found in Results Review.   Recommendations:  Left voice mail with ICM number and encouraged to call if experiencing any fluid symptoms.     Follow-up plan: ICM clinic phone appointment on 11/07/2021.   91 day device  clinic remote transmission 01/03/2022.   EP/Cardiology Office Visits:   11/09/2021 with Dr Aundra Dubin.  12/28/2021 with Dr Caryl Comes    Copy of ICM check sent to Dr. Caryl Comes.     3 month ICM trend: 10/03/2021.    12-14 Month ICM trend:     Rosalene Billings, RN 10/05/2021 12:25 PM

## 2021-10-10 ENCOUNTER — Telehealth (HOSPITAL_COMMUNITY): Payer: Self-pay

## 2021-10-10 MED ORDER — POTASSIUM CHLORIDE CRYS ER 20 MEQ PO TBCR: 40.0000 meq | EXTENDED_RELEASE_TABLET | Freq: Every day | ORAL | 3 refills | Status: DC

## 2021-10-10 NOTE — Telephone Encounter (Signed)
Patient advised and verbalized understanding. Patient reports she had been taking 21meq bid increased to 2 tablets by mouth daily. Med list updated to reflect changes. Patient has pending lab appt scheduled 10/3.   Meds ordered this encounter  Medications   potassium chloride SA (KLOR-CON M20) 20 MEQ tablet    Sig: Take 2 tablets (40 mEq total) by mouth daily.    Dispense:  180 tablet    Refill:  3    Please cancel all previous orders for current medication. Change in dosage or pill size.

## 2021-10-10 NOTE — Telephone Encounter (Signed)
-----   Message from Larey Dresser, MD sent at 10/05/2021  6:24 PM EDT ----- Increase total daily KCl by 20 mEq.  Repeat BMET 10 days.

## 2021-10-18 ENCOUNTER — Ambulatory Visit (HOSPITAL_COMMUNITY)
Admission: RE | Admit: 2021-10-18 | Discharge: 2021-10-18 | Disposition: A | Discharge status: Home / Self Care | Payer: Medicare Other | Source: Ambulatory Visit | Attending: Cardiology | Admitting: Cardiology

## 2021-10-18 DIAGNOSIS — I5020 Unspecified systolic (congestive) heart failure: Secondary | ICD-10-CM | POA: Diagnosis present

## 2021-10-18 LAB — BASIC METABOLIC PANEL
Anion gap: 12 (ref 5–15)
BUN: 46 mg/dL — ABNORMAL HIGH (ref 8–23)
CO2: 19 mmol/L — ABNORMAL LOW (ref 22–32)
Calcium: 9.4 mg/dL (ref 8.9–10.3)
Chloride: 103 mmol/L (ref 98–111)
Creatinine, Ser: 1.96 mg/dL — ABNORMAL HIGH (ref 0.44–1.00)
GFR, Estimated: 27 mL/min — ABNORMAL LOW (ref >60)
Glucose, Bld: 141 mg/dL — ABNORMAL HIGH (ref 70–99)
Potassium: 4.4 mmol/L (ref 3.5–5.1)
Sodium: 134 mmol/L — ABNORMAL LOW (ref 135–145)

## 2021-10-18 NOTE — Progress Notes (Signed)
Remote ICD transmission.   

## 2021-10-19 ENCOUNTER — Ambulatory Visit: Payer: Medicare Other | Attending: Critical Care Medicine | Admitting: Critical Care Medicine

## 2021-10-19 ENCOUNTER — Encounter: Payer: Self-pay | Admitting: Critical Care Medicine

## 2021-10-19 VITALS — HR 84 | Ht 64.5 in | Wt 177.4 lb

## 2021-10-19 DIAGNOSIS — Z0000 Encounter for general adult medical examination without abnormal findings: Secondary | ICD-10-CM | POA: Diagnosis not present

## 2021-10-19 DIAGNOSIS — Z23 Encounter for immunization: Secondary | ICD-10-CM | POA: Diagnosis not present

## 2021-10-19 DIAGNOSIS — R053 Chronic cough: Secondary | ICD-10-CM | POA: Diagnosis not present

## 2021-10-19 DIAGNOSIS — J309 Allergic rhinitis, unspecified: Secondary | ICD-10-CM

## 2021-10-19 DIAGNOSIS — K219 Gastro-esophageal reflux disease without esophagitis: Secondary | ICD-10-CM

## 2021-10-19 DIAGNOSIS — I6340 Cerebral infarction due to embolism of unspecified cerebral artery: Secondary | ICD-10-CM | POA: Diagnosis not present

## 2021-10-19 DIAGNOSIS — N1831 Chronic kidney disease, stage 3a: Secondary | ICD-10-CM

## 2021-10-19 DIAGNOSIS — I5020 Unspecified systolic (congestive) heart failure: Secondary | ICD-10-CM

## 2021-10-19 MED ORDER — DAPAGLIFLOZIN PROPANEDIOL 10 MG PO TABS: 10.0000 mg | ORAL_TABLET | Freq: Every day | ORAL | 0 refills | Status: DC

## 2021-10-19 MED ORDER — CETIRIZINE HCL 10 MG PO TABS: 10.0000 mg | ORAL_TABLET | Freq: Every day | ORAL | 3 refills | Status: DC

## 2021-10-19 MED ORDER — CARVEDILOL 6.25 MG PO TABS: 6.2500 mg | ORAL_TABLET | Freq: Two times a day (BID) | ORAL | 3 refills | Status: DC

## 2021-10-19 MED ORDER — FLUTICASONE PROPIONATE 50 MCG/ACT NA SUSP: 2.0000 | Freq: Every day | NASAL | 1 refills | Status: DC

## 2021-10-19 MED ORDER — PANTOPRAZOLE SODIUM 40 MG PO TBEC: 40.0000 mg | DELAYED_RELEASE_TABLET | Freq: Every day | ORAL | 3 refills | Status: DC

## 2021-10-19 MED ORDER — ALBUTEROL SULFATE HFA 108 (90 BASE) MCG/ACT IN AERS: 2.0000 | INHALATION_SPRAY | Freq: Every evening | RESPIRATORY_TRACT | 3 refills | Status: DC | PRN

## 2021-10-19 MED ORDER — FUROSEMIDE 40 MG PO TABS: 40.0000 mg | ORAL_TABLET | Freq: Two times a day (BID) | ORAL | 3 refills | Status: DC

## 2021-10-19 MED ORDER — POTASSIUM CHLORIDE CRYS ER 20 MEQ PO TBCR: 40.0000 meq | EXTENDED_RELEASE_TABLET | Freq: Every day | ORAL | 3 refills | Status: DC

## 2021-10-19 MED ORDER — CLOPIDOGREL BISULFATE 75 MG PO TABS: 75.0000 mg | ORAL_TABLET | Freq: Every day | ORAL | 3 refills | Status: DC

## 2021-10-19 MED ORDER — ATORVASTATIN CALCIUM 80 MG PO TABS: 80.0000 mg | ORAL_TABLET | Freq: Every day | ORAL | 3 refills | Status: DC

## 2021-10-19 MED ORDER — ENTRESTO 49-51 MG PO TABS: 1.0000 | ORAL_TABLET | Freq: Two times a day (BID) | ORAL | 3 refills | Status: DC

## 2021-10-19 MED ORDER — SPIRONOLACTONE 25 MG PO TABS: 25.0000 mg | ORAL_TABLET | Freq: Every day | ORAL | 2 refills | Status: DC

## 2021-10-19 NOTE — Assessment & Plan Note (Signed)
Patient agrees to and received Prevnar 20 and flu vaccine this visit

## 2021-10-19 NOTE — Assessment & Plan Note (Signed)
Continue with reflux medication

## 2021-10-19 NOTE — Assessment & Plan Note (Signed)
Renew nasal medication and Astelin

## 2021-10-19 NOTE — Assessment & Plan Note (Signed)
Heart failure stable at this time plan refill on all medications patient to follow-up with cardiology

## 2021-10-19 NOTE — Assessment & Plan Note (Signed)
Renal function stable we will monitor 

## 2021-10-19 NOTE — Patient Instructions (Signed)
Pneumonia vaccine and flu vaccine given  Refills on all medications sent to our pharmacy  Return to see Dr. Joya Gaskins 4 months  Advanced directive paperwork given you can choose to fill this out and have it notarized turned back and a copy to [place] is okay if you do not process things  Please get a dental exam you need significant dental work this will improve your health outcomes  Keep your upcoming appointment with cardiology

## 2021-10-19 NOTE — Progress Notes (Signed)
Annual Wellness Visit     Patient: [name], Female    DOB: March 15, 1950, 71 y.o.   MRN: [id number]  Subjective  Chief Complaint  Patient presents with   Medicare Wellness    [name] is a 71 y.o. female who presents today for her Annual Wellness Visit. She reports consuming a low sodium diet. Home exercise routine includes walking 1/2 hrs per days. She generally feels well. She reports sleeping well. She does not have additional problems to discuss today.   71 y.o. F PCP Laural Benes last seen 05/2021 This is a Medicare wellness visit below her documented problems being followed she needs a pneumonia and flu vaccine this visit she did receive these NICM ,CHF ICD GERD CVA, CKD stage 3a  Patient last seen by heart failure clinic in July as documented below  CHF clinic 07/2021 PCP: Marcine Matar, MD Neurologist: Dr Pearlean Brownie EP: Dr Graciela Husbands.  HF Cardiologist: Dr Shirlee Latch    HPI: [name] is a 71 y.o. female with chronic systolic CHF with nonischemic cardiomypathy (no significant CAD) s/p Medtronic CRT-D, LBBB, mild-mod MR and CVA (09/2013).   Coronary angiography in 7/15 showed no significant CAD.  Admitted in 9/15 with right sided facial droop and expressive aphasia, CVA on MRI. Had TEE showing EF 15%.     QRS narrowed, so in 11/16 device was reprogrammed to VVI.    Echo in 12/17 showed EF up to 45-50%.  Echo in 3/19 showed EF 45-50% with moderate LVH and normal RV.    Echo was done in 4/21 showing EF decreased to 30-35% with LAD territory wall motion abnormalities.  LHC/RHC in 5/21 showed normal filling pressures and cardiac output, no significant CAD.  With persistent LBBB, CRT was activated by Dr. Graciela Husbands in 9/21.    Echo 3/23 showed EF 35-40%, diffuse hypokinesis, mildly decreased RV systolic function.    Seen in ED 4/23 with mechanical fall. CT head showed scalp  hematoma, no IC bleed or fracture.   Today she returns for HF follow up with her husband. Overall feeling  fine. She has dyspnea walking further distances on flat ground, walks with a cane for balance. Denies palpitations, abnormal bleeding, CP, dizziness, edema, or PND/Orthopnea. Appetite ok. No fever or chills. Weight at home 175 pounds. Taking all medications.      Medtronic device interrogation: stable thoracic impedance, no AF/VT, 0.6 hr/day activity   Labs (2/21): K 3.8, creatinine 0.96, LDL 105  Labs (9/21): LDL 92 Labs (12/21): K 3.7, creatinine 0.87 Labs (4/22): LDL 66 Labs (8/22): K 3.9, creatinine 1.12 Labs (1/23): K 4, creatinine 1.14 Labs (3/23): LDL 71, HDL 34 Labs (6/23): K 5.0, creatinine 1.61   ECG (personally reviewed): none ordered.   PMH: 1. Nonischemic cardiomyopathy: Echo (7/15) with severe LV dilation, EF 25% with diffuse hypokinesis, mild to moderate MR.  RHC/ LHC 08/08/13 with RA 4, PA 23/8, PCWP 11, CI 2.52; no CAD.  TEE (9/15) with EF 15-20%, severe global hypokinesis, no LV thrombus, moderate MR, normal RV.  Echo (10/2013): EF 25-30%, grade I DD, mild AI. Medtronic CRT-D (10/15).  Echo (2/16) with EF 25-30%, diffuse hypokinesis worse in the septum, grade I diastolic dysfunction, normal RV size and systolic function, mild-moderate MR.  - Echo (8/16) with EF 35-40%, diffuse hypokinesis, mild-moderate mitral regurgitation.  - Echo (12/17): EF 45-50%, mild LVH, mild to moderate MR.  - Echo (3/19): EF 45-50% with moderate LVH, mild MR, normal RV size and systolic function.  -  Echo (4/21): EF 30-35% with LAD territory wall motion abnormalities. - LHC/RHC (5/21): No significant CAD; mean RA 6, PA 31/13, mean PCWP 13, CI 3.37.  - Echo (3/23): EF 35-40%, diffuse hypokinesis, mildly decreased RV systolic function. 2. CVA: 9/15 with dysarthria.  Carotids 9/15 with no significant CAD.  - Carotid dopplers (10/17): mild plaque, normal subclavians.  3. LBBB: Medtronic CRT-D (10/2013) 4. Hyperlipidemia 5. Cholelithiasis 6. Peripheral arterial dopplers (6/19): Normal  ASSESSMENT &  PLAN: 1. Chronic Systolic Heart Failure: Nonischemic cardiomyopathy s/p Medtronic CRT-D in 10/15, coronary angiography without CAD in 07/2013. Echo done in 2/16, EF 25-30%.  Repeat echo in 8/16 with EF up to 35-40%.  Echo in 4/19 showed EF up to 45-50%.  However, echo in 4/21 showed EF back down to 30-35%.  LHC/RHC in 5/21 showed no CAD, normal filling pressures, preserved cardiac output.  Of note, Medtronic device had been reprogrammed to VVI as LBBB has been only intermittent => more recently, LBBB has been persistent and CRT was reactivated in 9/21. Echo 3/23 showed EF 35-40%, diffuse hypokinesis, mildly decreased RV systolic function.  NYHA class II symptoms. She is not volume overloaded by exam or Optivol.     - Continue Coreg 6.25 mg bid, she did not tolerate increase to 9.375 mg bid.   - Continue Entresto 49/51 bid, no BP room to increase.  BMET today.    - Continue Lasix 40 mg daily. - Continue spironolactone 25 mg daily.  - Continue dapagliflozin 10 mg daily.   2. CVA: ?Embolic.  No atrial fibrillation has been detected so far on device.  She will continue Plavix for now. If future device interrogation shows atrial fibrillation, she will need to be anticoagulated.   3. Hyperlipidemia: Goal LDL < 70 with history of CVA.  Continue statin.  - Good lipids 3/23.   Follow up in 4 months with Dr. Aundra Dubin.   [name] FNP-BC  Patient has follow-up with cardiology in October and December  There are no active complaints    Vision: Corrected with refraction   Patient Active Problem List   Diagnosis Date Noted   History of CVA with residual deficit 02/22/2021   Weight loss 02/22/2021   Stage 3a chronic kidney disease ([name]) 02/22/2021   Heart failure with reduced ejection fraction ([name])- s/p ICD and pacemaker  10/19/2017   Allergic rhinitis 11/09/2014   Cerebral infarction due to embolism of cerebral artery ([name]) 12/03/2013   GERD (gastroesophageal reflux disease)    Non-ischemic  cardiomyopathy ([name]) 08/11/2013   Mitral regurgitation    Healthcare maintenance 04/09/2013   Past Medical History:  Diagnosis Date   Arthritis    Automatic implantable cardioverter-defibrillator in situ    Cardiac resynchronization therapy defibrillator (CRT-D) in place    a. placed on 11/05/13.   GERD (gastroesophageal reflux disease)    HLD (hyperlipidemia)    Hypotension    LBBB (left bundle branch block)    Mitral regurgitation    a. Mild-mod MR by echo 07/2013.   PONV (postoperative nausea and vomiting)    Stroke ([place]) 09/18/7340   Systolic CHF ([place])    a. Dx 07/2013 - due to NICM. Cath normal cors. EF 25%.   Past Surgical History:  Procedure Laterality Date   BI-VENTRICULAR IMPLANTABLE CARDIOVERTER DEFIBRILLATOR N/A 11/05/2013   Procedure: BI-VENTRICULAR IMPLANTABLE CARDIOVERTER DEFIBRILLATOR  (CRT-D);  Surgeon: Deboraha Sprang, MD;  Location: [facility] CATH LAB;  Service: Cardiovascular;  Laterality: N/A;   BI-VENTRICULAR IMPLANTABLE CARDIOVERTER DEFIBRILLATOR  (  CRT-D)  11/05/2013   CARDIAC CATHETERIZATION  07/2013   KNEE ARTHROSCOPY Right    LEFT AND RIGHT HEART CATHETERIZATION WITH CORONARY ANGIOGRAM N/A 08/08/2013   Procedure: LEFT AND RIGHT HEART CATHETERIZATION WITH CORONARY ANGIOGRAM;  Surgeon: [name], MD;  Location: [facility] CATH LAB;  Service: Cardiovascular;  Laterality: N/A;   RIGHT/LEFT HEART CATH AND CORONARY ANGIOGRAPHY N/A 06/05/2019   Procedure: RIGHT/LEFT HEART CATH AND CORONARY ANGIOGRAPHY;  Surgeon: [name], MD;  Location[facility] INVAS[facility] CV LAB;  Service: Cardiovascular;  Laterality: N/A;   TEE WITHOUT CARDIOVERSION N/A 09/26/2013   Procedure: TRANSESOPHAGEAL ECHOCARDIOGRAM (TEE);  Surgeon: [name], MD;  Location: [facility] ENDOSCOPY;  Service: Cardiovascular;  Laterality: N/A;   TONSILLECTOMY     WRIST ARTHROSCOPY  12/21/2010   Procedure: ARTHROSCOPY WRIST;  Surgeon: [name], MD;  Location: [facility];  Service: Orthopedics;   Laterality: Right;  right wrist, repair TFCC on right, open reconstruction ECU sheath   Social History   Tobacco Use   Smoking status: Never   Smokeless tobacco: Never  Vaping Use   Vaping Use: Never used  Substance Use Topics   Alcohol use: No    Alcohol/week: 0.0 standard drinks of alcohol   Drug use: No   Social History   Socioeconomic History   Marital status: Married    Spouse name: Not on file   Number of children: 1   Years of education: 12   Highest education level: Not on file  Occupational History   Not on file  Tobacco Use   Smoking status: Never   Smokeless tobacco: Never  Vaping Use   Vaping Use: Never used  Substance and Sexual Activity   Alcohol use: No    Alcohol/week: 0.0 standard drinks of alcohol   Drug use: No   Sexual activity: Yes  Other Topics Concern   Not on file  Social History Narrative   Patient is married with one child.   Patient is right handed.   Patient has hs education and 3 yrs of college.   Patient drinks 1 can daily.   Social Determinants of Health   Financial Resource Strain: Not on file  Food Insecurity: Not on file  Transportation Needs: Not on file  Physical Activity: Not on file  Stress: Not on file  Social Connections: Not on file  Intimate Partner Violence: Not on file   Allergies  Allergen Reactions   Onion Other (See Comments)    gas   Tomato Other (See Comments)    reflux   Voltaren [Diclofenac] Rash      Medications: Outpatient Medications Prior to Visit  Medication Sig   [DISCONTINUED] albuterol (VENTOLIN HFA) 108 (90 Base) MCG/ACT inhaler Inhale 2 puffs into the lungs at bedtime as needed for wheezing or shortness of breath.   [DISCONTINUED] atorvastatin (LIPITOR) 80 MG tablet Take 1 tablet (80 mg total) by mouth daily at 6 PM.   [DISCONTINUED] carvedilol (COREG) 6.25 MG tablet Take 1 tablet (6.25 mg total) by mouth 2 (two) times daily with a meal.   [DISCONTINUED] cetirizine (ZYRTEC) 10 MG tablet  Take 10 mg by mouth daily.   [DISCONTINUED] clopidogrel (PLAVIX) 75 MG tablet Take 1 tablet (75 mg total) by mouth daily.   [DISCONTINUED] dapagliflozin propanediol (FARXIGA) 10 MG TABS tablet Take 1 tablet (10 mg total) by mouth daily.   [DISCONTINUED] famotidine (PEPCID) 20 MG tablet Take 20 mg by mouth 2 (two) times daily.   [DISCONTINUED] fluticasone (FLONASE)  50 MCG/ACT nasal spray Place 2 sprays into both nostrils daily.   [DISCONTINUED] furosemide (LASIX) 40 MG tablet Take 1 tablet (40 mg total) by mouth 2 (two) times daily.   [DISCONTINUED] pantoprazole (PROTONIX) 40 MG tablet Take 1 tablet (40 mg total) by mouth daily.   [DISCONTINUED] potassium chloride SA (KLOR-CON M20) 20 MEQ tablet Take 2 tablets (40 mEq total) by mouth daily.   [DISCONTINUED] sacubitril-valsartan (ENTRESTO) 49-51 MG Take 1 tablet by mouth 2 (two) times daily.   [DISCONTINUED] spironolactone (ALDACTONE) 25 MG tablet Take 1 tablet (25 mg total) by mouth daily.   [DISCONTINUED] methocarbamol (ROBAXIN) 500 MG tablet TAKE ONE TABLET BY MOUTH THREE TIMES DAILY AS NEEDED (Patient not taking: Reported on 10/19/2021)   No facility-administered medications prior to visit.    Allergies  Allergen Reactions   Onion Other (See Comments)    gas   Tomato Other (See Comments)    reflux   Voltaren [Diclofenac] Rash    Patient Care Team: Storm Frisk, MD as PCP - General (Pulmonary Disease) [name], MD as PCP - Advanced Heart Failure (Cardiology)  Review of Systems  Constitutional:  Negative for chills, diaphoresis, fever, malaise/fatigue and weight loss.  HENT:  Negative for congestion, hearing loss, nosebleeds, sore throat and tinnitus.   Eyes:  Negative for blurred vision, photophobia and redness.  Respiratory:  Negative for cough, hemoptysis, sputum production, shortness of breath, wheezing and stridor.   Cardiovascular:  Negative for chest pain, palpitations, orthopnea, claudication, leg swelling and PND.   Gastrointestinal:  Negative for abdominal pain, blood in stool, constipation, diarrhea, heartburn, nausea and vomiting.  Genitourinary:  Negative for dysuria, flank pain, frequency, hematuria and urgency.  Musculoskeletal:  Negative for back pain, falls, joint pain, myalgias and neck pain.  Skin:  Negative for itching and rash.  Neurological:  Negative for dizziness, tingling, tremors, sensory change, speech change, focal weakness, seizures, loss of consciousness, weakness and headaches.  Endo/Heme/Allergies:  Negative for environmental allergies and polydipsia. Does not bruise/bleed easily.  Psychiatric/Behavioral:  Negative for depression, memory loss, substance abuse and suicidal ideas. The patient is not nervous/anxious and does not have insomnia.         Objective  Pulse 84   Ht 5' 4.5" (1.638 m)   Wt 177 lb 6.4 oz (80.5 kg)   SpO2 98%   BMI 29.98 kg/m  BP Readings from Last 3 Encounters:  08/08/21 96/62  05/24/21 92/60  04/29/21 102/87   Wt Readings from Last 3 Encounters:  10/19/21 177 lb 6.4 oz (80.5 kg)  08/08/21 175 lb 3.2 oz (79.5 kg)  05/24/21 177 lb (80.3 kg)      Physical Exam Vitals reviewed.  Constitutional:      Appearance: Normal appearance. She is well-developed. She is not diaphoretic.  HENT:     Head: Normocephalic and atraumatic.     Nose: Nose normal. No nasal deformity, septal deviation, mucosal edema or rhinorrhea.     Right Sinus: No maxillary sinus tenderness or frontal sinus tenderness.     Left Sinus: No maxillary sinus tenderness or frontal sinus tenderness.     Mouth/Throat:     Mouth: Mucous membranes are moist.     Pharynx: Oropharynx is clear. No oropharyngeal exudate.     Comments: Poor dentition Eyes:     General: No scleral icterus.    Conjunctiva/sclera: Conjunctivae normal.     Pupils: Pupils are equal, round, and reactive to light.  Neck:     Thyroid:  No thyromegaly.     Vascular: No carotid bruit or JVD.     Trachea:  Trachea normal. No tracheal tenderness or tracheal deviation.  Cardiovascular:     Rate and Rhythm: Normal rate and regular rhythm.     Chest Wall: PMI is not displaced.     Pulses: Normal pulses. No decreased pulses.     Heart sounds: Normal heart sounds, S1 normal and S2 normal. Heart sounds not distant. No murmur heard.    No systolic murmur is present.     No diastolic murmur is present.     No friction rub. No gallop. No S3 or S4 sounds.  Pulmonary:     Effort: No tachypnea, accessory muscle usage or respiratory distress.     Breath sounds: No stridor. No decreased breath sounds, wheezing, rhonchi or rales.  Chest:     Chest wall: No tenderness.  Abdominal:     General: Bowel sounds are normal. There is no distension.     Palpations: Abdomen is soft. Abdomen is not rigid.     Tenderness: There is no abdominal tenderness. There is no guarding or rebound.  Musculoskeletal:        General: Normal range of motion.     Cervical back: Normal range of motion and neck supple. No edema, erythema or rigidity. No muscular tenderness. Normal range of motion.  Lymphadenopathy:     Head:     Right side of head: No submental or submandibular adenopathy.     Left side of head: No submental or submandibular adenopathy.     Cervical: No cervical adenopathy.  Skin:    General: Skin is warm and dry.     Coloration: Skin is not pale.     Findings: No rash.     Nails: There is no clubbing.  Neurological:     Mental Status: She is alert and oriented to person, place, and time.     Sensory: No sensory deficit.  Psychiatric:        Mood and Affect: Mood normal.        Speech: Speech normal.        Behavior: Behavior normal.        Thought Content: Thought content normal.      Most recent fall risk assessment:    10/19/2021   11:09 AM  Fall Risk   Falls in the past year? 0  Number falls in past yr: 0  Injury with Fall? 0  Risk for fall due to : No Fall Risks  Follow up Falls evaluation  completed    Most recent depression screenings:    10/19/2021   11:09 AM 05/24/2021   10:58 AM  PHQ 2/9 Scores  PHQ - 2 Score 0 0  PHQ- 9 Score  0   Most recent cognitive screening: Mini-Mental status exam normal Most recent Audit-C alcohol use screening No alcohol use  A score of 3 or more in women, and 4 or more in men indicates increased risk for alcohol abuse, EXCEPT if all of the points are from question 1   Vision/Hearing Screen: Vision corrected with glasses hearing not tested  Last CBC Lab Results  Component Value Date   WBC 10.1 05/24/2021   HGB 11.4 05/24/2021   HCT 34.3 05/24/2021   MCV 90 05/24/2021   MCH 29.8 05/24/2021   RDW 12.6 05/24/2021   PLT 292 05/24/2021   Last metabolic panel Lab Results  Component Value Date   GLUCOSE 141 (H)  10/18/2021   NA 134 (L) 10/18/2021   K 4.4 10/18/2021   CL 103 10/18/2021   CO2 19 (L) 10/18/2021   BUN 46 (H) 10/18/2021   CREATININE 1.96 (H) 10/18/2021   GFRNONAA 27 (L) 10/18/2021   CALCIUM 9.4 10/18/2021   PROT 7.5 04/29/2021   ALBUMIN 3.8 04/29/2021   BILITOT 1.1 04/29/2021   ALKPHOS 108 04/29/2021   AST 18 04/29/2021   ALT 16 04/29/2021   ANIONGAP 12 10/18/2021   Last lipids Lab Results  Component Value Date   CHOL 126 04/11/2021   HDL 34 (L) 04/11/2021   LDLCALC 71 04/11/2021   TRIG 106 04/11/2021   CHOLHDL 3.7 04/11/2021   Last hemoglobin A1c Lab Results  Component Value Date   HGBA1C 5.3 10/19/2017   Last thyroid functions Lab Results  Component Value Date   TSH 3.780 05/24/2021      No results found for any visits on 10/19/21.    Assessment & Plan   Annual wellness visit done today including the all of the following: Reviewed patient's Family Medical History Reviewed and updated list of patient's medical providers Assessment of cognitive impairment was done Assessed patient's functional ability Established a written schedule for health screening services Health Risk Assessent  Completed and Reviewed  Exercise Activities and Dietary recommendations  Goals   None     Immunization History  Administered Date(s) Administered   Fluad Quad(high Dose 65+) 10/19/2021   Influenza, High Dose Seasonal PF 09/15/2016   Influenza,inj,Quad PF,6+ Mos 04/09/2013, 11/06/2013, 09/24/2014, 10/19/2017, 09/16/2018   Influenza-Unspecified 11/17/2015, 10/15/2020   PNEUMOCOCCAL CONJUGATE-20 10/19/2021   Pneumococcal Conjugate-13 08/09/2016   Pneumococcal Polysaccharide-23 09/25/2013   Tdap 07/09/2017   Zoster Recombinat (Shingrix) 09/15/2016    Health Maintenance  Topic Date Due   COVID-19 Vaccine (1) Never done   Zoster Vaccines- Shingrix (2 of 2) 11/10/2016   MAMMOGRAM  09/16/2022   COLONOSCOPY (Pts 45-94yrs Insurance coverage will need to be confirmed)  11/08/2024   TETANUS/TDAP  07/10/2027   Pneumonia Vaccine 7+ Years old  Completed   INFLUENZA VACCINE  Completed   DEXA SCAN  Completed   Hepatitis C Screening  Completed   HPV VACCINES  Aged Out     Discussed health benefits of physical activity, and encouraged her to engage in regular exercise appropriate for her age and condition.    Problem List Items Addressed This Visit       Cardiovascular and Mediastinum   Heart failure with reduced ejection fraction (HCC)- s/p ICD and pacemaker     Heart failure stable at this time plan refill on all medications patient to follow-up with cardiology      Relevant Medications   atorvastatin (LIPITOR) 80 MG tablet   carvedilol (COREG) 6.25 MG tablet   furosemide (LASIX) 40 MG tablet   sacubitril-valsartan (ENTRESTO) 49-51 MG   spironolactone (ALDACTONE) 25 MG tablet     Respiratory   Allergic rhinitis    Renew nasal medication and Astelin        Digestive   GERD (gastroesophageal reflux disease)    Continue with reflux medication      Relevant Medications   pantoprazole (PROTONIX) 40 MG tablet     Nervous and Auditory   Cerebral infarction due to embolism  of cerebral artery (HCC)    Continue with long-term Plavix      Relevant Medications   atorvastatin (LIPITOR) 80 MG tablet   clopidogrel (PLAVIX) 75 MG tablet     Genitourinary  Stage 3a chronic kidney disease (HCC)    Renal function stable we will monitor        Other   Healthcare maintenance    Patient agrees to and received Prevnar 20 and flu vaccine this visit      RESOLVED: Chronic cough   Relevant Medications   albuterol (VENTOLIN HFA) 108 (90 Base) MCG/ACT inhaler   Other Visit Diagnoses     Need for vaccination for Strep pneumoniae    -  Primary   Relevant Orders   Pneumococcal conjugate vaccine 20-valent (Completed)   Need for immunization against influenza       Relevant Orders   Flu Vaccine QUAD High Dose(Fluad) (Completed)   Chronic kidney disease, stage 3a (HCC)       Relevant Medications   dapagliflozin propanediol (FARXIGA) 10 MG TABS tablet       Return in about 4 months (around 02/19/2022) for htn, heart failure.     Shan Levans, MD

## 2021-10-19 NOTE — Progress Notes (Deleted)
Established Patient Office Visit  Subjective   Patient ID: [name], female    DOB: 05-11-1950  Age: 71 y.o. MRN: [id number]  No chief complaint on file.   71 y.o. F PCP Wynetta Emery last seen 05/2021  NICM ,CHF ICD GERD CVA, CKD stage 3a  Pcv 20 flu needed  CHF clinic 07/2021 PCP: Ladell Pier, MD Neurologist: Dr Leonie Man EP: Dr Caryl Comes.  HF Cardiologist: Dr Aundra Dubin    HPI: [name] is a 71 y.o. female with chronic systolic CHF with nonischemic cardiomypathy (no significant CAD) s/p Medtronic CRT-D, LBBB, mild-mod MR and CVA (09/2013).   Coronary angiography in 7/15 showed no significant CAD.  Admitted in 9/15 with right sided facial droop and expressive aphasia, CVA on MRI. Had TEE showing EF 15%.     QRS narrowed, so in 11/16 device was reprogrammed to VVI.    Echo in 12/17 showed EF up to 45-50%.  Echo in 3/19 showed EF 45-50% with moderate LVH and normal RV.    Echo was done in 4/21 showing EF decreased to 30-35% with LAD territory wall motion abnormalities.  LHC/RHC in 5/21 showed normal filling pressures and cardiac output, no significant CAD.  With persistent LBBB, CRT was activated by Dr. Caryl Comes in 9/21.    Echo 3/23 showed EF 35-40%, diffuse hypokinesis, mildly decreased RV systolic function.    Seen in ED 4/23 with mechanical fall. CT head showed scalp  hematoma, no IC bleed or fracture.   Today she returns for HF follow up with her husband. Overall feeling fine. She has dyspnea walking further distances on flat ground, walks with a cane for balance. Denies palpitations, abnormal bleeding, CP, dizziness, edema, or PND/Orthopnea. Appetite ok. No fever or chills. Weight at home 175 pounds. Taking all medications.      Medtronic device interrogation: stable thoracic impedance, no AF/VT, 0.6 hr/day activity   Labs (2/21): K 3.8, creatinine 0.96, LDL 105  Labs (9/21): LDL 92 Labs (12/21): K 3.7, creatinine 0.87 Labs (4/22): LDL 66 Labs (8/22): K 3.9, creatinine  1.12 Labs (1/23): K 4, creatinine 1.14 Labs (3/23): LDL 71, HDL 34 Labs (6/23): K 5.0, creatinine 1.61   ECG (personally reviewed): none ordered.   PMH: 1. Nonischemic cardiomyopathy: Echo (7/15) with severe LV dilation, EF 25% with diffuse hypokinesis, mild to moderate MR.  RHC/ LHC 08/08/13 with RA 4, PA 23/8, PCWP 11, CI 2.52; no CAD.  TEE (9/15) with EF 15-20%, severe global hypokinesis, no LV thrombus, moderate MR, normal RV.  Echo (10/2013): EF 25-30%, grade I DD, mild AI. Medtronic CRT-D (10/15).  Echo (2/16) with EF 25-30%, diffuse hypokinesis worse in the septum, grade I diastolic dysfunction, normal RV size and systolic function, mild-moderate MR.  - Echo (8/16) with EF 35-40%, diffuse hypokinesis, mild-moderate mitral regurgitation.  - Echo (12/17): EF 45-50%, mild LVH, mild to moderate MR.  - Echo (3/19): EF 45-50% with moderate LVH, mild MR, normal RV size and systolic function.  - Echo (4/21): EF 30-35% with LAD territory wall motion abnormalities. - LHC/RHC (5/21): No significant CAD; mean RA 6, PA 31/13, mean PCWP 13, CI 3.37.  - Echo (3/23): EF 35-40%, diffuse hypokinesis, mildly decreased RV systolic function. 2. CVA: 9/15 with dysarthria.  Carotids 9/15 with no significant CAD.  - Carotid dopplers (10/17): mild plaque, normal subclavians.  3. LBBB: Medtronic CRT-D (10/2013) 4. Hyperlipidemia 5. Cholelithiasis 6. Peripheral arterial dopplers (6/19): Normal  ASSESSMENT & PLAN: 1. Chronic Systolic Heart Failure: Nonischemic cardiomyopathy  s/p Medtronic CRT-D in 10/15, coronary angiography without CAD in 07/2013. Echo done in 2/16, EF 25-30%.  Repeat echo in 8/16 with EF up to 35-40%.  Echo in 4/19 showed EF up to 45-50%.  However, echo in 4/21 showed EF back down to 30-35%.  LHC/RHC in 5/21 showed no CAD, normal filling pressures, preserved cardiac output.  Of note, Medtronic device had been reprogrammed to VVI as LBBB has been only intermittent => more recently, LBBB has been  persistent and CRT was reactivated in 9/21. Echo 3/23 showed EF 35-40%, diffuse hypokinesis, mildly decreased RV systolic function.  NYHA class II symptoms. She is not volume overloaded by exam or Optivol.     - Continue Coreg 6.25 mg bid, she did not tolerate increase to 9.375 mg bid.   - Continue Entresto 49/51 bid, no BP room to increase.  BMET today.    - Continue Lasix 40 mg daily. - Continue spironolactone 25 mg daily.  - Continue dapagliflozin 10 mg daily.   2. CVA: ?Embolic.  No atrial fibrillation has been detected so far on device.  She will continue Plavix for now. If future device interrogation shows atrial fibrillation, she will need to be anticoagulated.   3. Hyperlipidemia: Goal LDL < 70 with history of CVA.  Continue statin.  - Good lipids 3/23.   Follow up in 4 months with Dr. Shirlee Latch.   [name] [facility] FNP-BC  Appt Shirlee Latch 11/09/21   Graciela Husbands in December      {History (Optional):23778}  ROS    Objective:     There were no vitals taken for this visit. {Vitals History (Optional):23777}  Physical Exam   No results found for any visits on 10/19/21.  {Labs (Optional):23779}  The ASCVD Risk score (Arnett DK, et al., 2019) failed to calculate for the following reasons:   The valid total cholesterol range is 130 to 320 mg/dL    Assessment & Plan:   Problem List Items Addressed This Visit   None   No follow-ups on file.    Shan Levans, MD

## 2021-10-19 NOTE — Assessment & Plan Note (Signed)
Continue with long-term Plavix

## 2021-10-24 ENCOUNTER — Telehealth (HOSPITAL_COMMUNITY): Payer: Self-pay | Admitting: *Deleted

## 2021-10-24 DIAGNOSIS — I5022 Chronic systolic (congestive) heart failure: Secondary | ICD-10-CM

## 2021-10-24 DIAGNOSIS — I5020 Unspecified systolic (congestive) heart failure: Secondary | ICD-10-CM

## 2021-10-24 MED ORDER — FUROSEMIDE 40 MG PO TABS: ORAL_TABLET | ORAL | 3 refills | Status: DC

## 2021-10-24 NOTE — Telephone Encounter (Signed)
Called patient per Dr. Aundra Dubin with following instructions:  "Hold Lasix for a day then decrease to 40 every am and 20 every pm. Repeat labs in 10 days".  Pt says she stopped potassium on her own after seeing her lab results. Asked her to re-start at prescribed dose (40 meq daily).  Pt says she has f/u appointment on 11/09/21 and would like to get repeat labs at that visit.

## 2021-11-07 ENCOUNTER — Ambulatory Visit (INDEPENDENT_AMBULATORY_CARE_PROVIDER_SITE_OTHER): Payer: Medicare Other

## 2021-11-07 DIAGNOSIS — Z95810 Presence of automatic (implantable) cardiac defibrillator: Secondary | ICD-10-CM

## 2021-11-07 DIAGNOSIS — I5022 Chronic systolic (congestive) heart failure: Secondary | ICD-10-CM

## 2021-11-08 NOTE — Progress Notes (Signed)
EPIC Encounter for ICM Monitoring  Patient Name: [name] is a 71 y.o. female Date: 11/08/2021 Primary Care Physican: [name], MD Primary Cardiologist: Aundra Dubin Electrophysiologist: Vergie Living Pacing: 96.7%         04/11/2021 Office Weight: 175 lbs 06/20/2021 Weight: 176 lbs  08/16/2021 Weight: 174 lbs 08/22/2021 Weight: 175 lbs                                                          Transmission reviewed.      Optivol thoracic impedance suggesting possible fluid accumulation starting 10/16.    Prescribed:  Furosemide 40 mg take 1 tablet (40 mg total) by mouth twice a day  Spironolactone 25 mg take 1 tablet by mouth daily (Restarted 8/1)   Labs: 10/05/2021 Creatinine 1.26, BUN 14, Potassium 3.1, Sodium 140, GFR 45 08/26/2021 Creatinine 1.37, BUN 18, Potassium 4.0, Sodium 141, GFR 41 08/11/2021 Creatinine 1.29, BUN 27, Potassium 4.4, Sodium 137, GFR 44 08/08/2021 Creatinine 2.50, BUN 52, Potassium 5.6, Sodium 135, GFR 20 (Lasix and Entresto held for 2 days starting 7/25, Spironolactone discontinued) 07/14/2021 Creatinine 1.61, BUN 28, Potassium 5.0, Sodium 136, GFR 34 07/01/2021 Creatinine 1.9,   BUN 22, Potassium 3.0, Sodium 139, GFR 37 04/29/2021 Creatinine 1.60, BUN 30, Potassium 4.0, Sodium 138 04/21/2021 Creatinine 1.85, BUN 30, Potassium 3.6, Sodium 137, GFR 29  04/11/2021 Creatinine 1.79, BUN 37, Potassium 5.2, Sodium 136, GFR 30 01/26/2021 Creatinine 1.14, BUN 16, Potassium 4.0, Sodium 133, GFR 51 01/19/2021 Creatinine 1.04, BUN 10, Potassium 3.4, Sodium 136, GFR 57  A complete set of results can be found in Results Review.   Recommendations:  Recommendations will be given at 10/25 OV with Dr Aundra Dubin.     Follow-up plan: ICM clinic phone appointment on 11/14/2021 to recheck fluid levels   91 day device clinic remote transmission 01/03/2022.   EP/Cardiology Office Visits:   11/09/2021 with Dr Aundra Dubin.  12/28/2021 with Dr Caryl Comes    Copy of ICM check sent to Dr.  Caryl Comes.     3 month ICM trend: 11/08/2021.    12-14 Month ICM trend:     Rosalene Billings, RN 11/08/2021 2:54 PM

## 2021-11-09 ENCOUNTER — Ambulatory Visit (HOSPITAL_COMMUNITY)
Admission: RE | Admit: 2021-11-09 | Discharge: 2021-11-09 | Disposition: A | Discharge status: Home / Self Care | Payer: Medicare Other | Source: Ambulatory Visit | Attending: Cardiology | Admitting: Cardiology

## 2021-11-09 VITALS — BP 130/80 | HR 65 | Wt 184.6 lb

## 2021-11-09 DIAGNOSIS — E785 Hyperlipidemia, unspecified: Secondary | ICD-10-CM | POA: Diagnosis not present

## 2021-11-09 DIAGNOSIS — I5022 Chronic systolic (congestive) heart failure: Secondary | ICD-10-CM | POA: Insufficient documentation

## 2021-11-09 DIAGNOSIS — I428 Other cardiomyopathies: Secondary | ICD-10-CM | POA: Diagnosis not present

## 2021-11-09 DIAGNOSIS — Z8673 Personal history of transient ischemic attack (TIA), and cerebral infarction without residual deficits: Secondary | ICD-10-CM | POA: Diagnosis not present

## 2021-11-09 DIAGNOSIS — I5020 Unspecified systolic (congestive) heart failure: Secondary | ICD-10-CM | POA: Diagnosis not present

## 2021-11-09 DIAGNOSIS — Z7902 Long term (current) use of antithrombotics/antiplatelets: Secondary | ICD-10-CM | POA: Diagnosis not present

## 2021-11-09 LAB — BASIC METABOLIC PANEL
Anion gap: 9 (ref 5–15)
BUN: 20 mg/dL (ref 8–23)
CO2: 24 mmol/L (ref 22–32)
Calcium: 8.7 mg/dL — ABNORMAL LOW (ref 8.9–10.3)
Chloride: 107 mmol/L (ref 98–111)
Creatinine, Ser: 1.28 mg/dL — ABNORMAL HIGH (ref 0.44–1.00)
GFR, Estimated: 45 mL/min — ABNORMAL LOW (ref >60)
Glucose, Bld: 94 mg/dL (ref 70–99)
Potassium: 3.1 mmol/L — ABNORMAL LOW (ref 3.5–5.1)
Sodium: 140 mmol/L (ref 135–145)

## 2021-11-09 LAB — BRAIN NATRIURETIC PEPTIDE: B Natriuretic Peptide: 75.4 pg/mL (ref 0.0–100.0)

## 2021-11-09 MED ORDER — FUROSEMIDE 40 MG PO TABS: 40.0000 mg | ORAL_TABLET | Freq: Two times a day (BID) | ORAL | 3 refills | Status: DC

## 2021-11-09 MED ORDER — POTASSIUM CHLORIDE CRYS ER 10 MEQ PO TBCR: 40.0000 meq | EXTENDED_RELEASE_TABLET | Freq: Every day | ORAL | 3 refills | Status: DC

## 2021-11-09 NOTE — Progress Notes (Signed)
ReDS Vest / Clip - 11/09/21 1000       ReDS Vest / Clip   Station Marker B    Ruler Value 32    ReDS Actual Value 26

## 2021-11-09 NOTE — Patient Instructions (Signed)
INCREASE Lasix to 60mg  in the morning and 40mg  in the evening for 1 day, then change to 40mg  Twice daily  PLEASE take your 40 meq of potassium every day.  Labs done today, your results will be available in MyChart, we will contact you for abnormal readings.  Repeat blood work in 10 days.  Your physician recommends that you schedule a follow-up appointment in: 1 month   If you have any questions or concerns before your next appointment please send [place] a message through [place] or call our office at [phone].    TO LEAVE A MESSAGE FOR THE NURSE SELECT OPTION 2, PLEASE LEAVE A MESSAGE INCLUDING: YOUR NAME DATE OF BIRTH CALL BACK NUMBER REASON FOR CALL**this is important as we prioritize the call backs  YOU WILL RECEIVE A CALL BACK THE SAME DAY AS LONG AS YOU CALL BEFORE 4:00 PM  At the [facility], you and your health needs are our priority. As part of our continuing mission to provide you with exceptional heart care, we have created designated Provider Care Teams. These Care Teams include your primary Cardiologist (physician) and Advanced Practice Providers (APPs- Physician Assistants and Nurse Practitioners) who all work together to provide you with the care you need, when you need it.   You may see any of the following providers on your designated Care Team at your next follow up: Dr Glori Bickers Dr Loralie Champagne Dr. Roxana Hires, NP Lyda Jester, [place] [facility] [place], [place] Forestine Na, NP Audry Riles, PharmD   Please be sure to bring in all your medications bottles to every appointment.

## 2021-11-09 NOTE — Progress Notes (Signed)
Patient ID: [name], female   DOB: 1950/09/27, 71 y.o.   MRN: [id number]     PCP: [name], MD Neurologist: Dr Leonie Man EP: Dr Caryl Comes.  HF Cardiologist: Dr Aundra Dubin   HPI: [name] is a 71 y.o. female with chronic systolic CHF with nonischemic cardiomypathy (no significant CAD) s/p Medtronic CRT-D, LBBB, mild-mod MR and CVA (09/2013).  Coronary angiography in 7/15 showed no significant CAD.  Admitted in 9/15 with right sided facial droop and expressive aphasia, CVA on MRI. Had TEE showing EF 15%.    QRS narrowed, so in 11/16 device was reprogrammed to VVI.   Echo in 12/17 showed EF up to 45-50%.  Echo in 3/19 showed EF 45-50% with moderate LVH and normal RV.   Echo was done in 4/21 showing EF decreased to 30-35% with LAD territory wall motion abnormalities.  LHC/RHC in 5/21 showed normal filling pressures and cardiac output, no significant CAD.  With persistent LBBB, CRT was activated by Dr. Caryl Comes in 9/21.   Echo 3/23 showed EF 35-40%, diffuse hypokinesis, mildly decreased RV systolic function.   Today she returns for HF follow up with her husband. Walks with cane for balance.  She has been short of breath walking longer distances such as in [employer].  She has a chronic cough and congestion.  No chest pain.  Weight up 9 lbs. No orthopnea/PND.     Medtronic device interrogation: Decreased thoracic impedance, no AF/VT, >99% BiV pacing  Labs (2/21): K 3.8, creatinine 0.96, LDL 105  Labs (9/21): LDL 92 Labs (12/21): K 3.7, creatinine 0.87 Labs (4/22): LDL 66 Labs (8/22): K 3.9, creatinine 1.12 Labs (1/23): K 4, creatinine 1.14 Labs (3/23): LDL 71, HDL 34 Labs (6/23): K 5.0, creatinine 1.61 Labs (10/23): K 4.4, creatinine 1.96  ECG (personally reviewed): NSR, BiV pacing  PMH: 1. Nonischemic cardiomyopathy: Echo (7/15) with severe LV dilation, EF 25% with diffuse hypokinesis, mild to moderate MR.  RHC/ LHC 08/08/13 with RA 4, PA 23/8, PCWP 11, CI 2.52; no CAD.  TEE (9/15)  with EF 15-20%, severe global hypokinesis, no LV thrombus, moderate MR, normal RV.  Echo (10/2013): EF 25-30%, grade I DD, mild AI. Medtronic CRT-D (10/15).  Echo (2/16) with EF 25-30%, diffuse hypokinesis worse in the septum, grade I diastolic dysfunction, normal RV size and systolic function, mild-moderate MR.  - Echo (8/16) with EF 35-40%, diffuse hypokinesis, mild-moderate mitral regurgitation.  - Echo (12/17): EF 45-50%, mild LVH, mild to moderate MR.  - Echo (3/19): EF 45-50% with moderate LVH, mild MR, normal RV size and systolic function.  - Echo (4/21): EF 30-35% with LAD territory wall motion abnormalities. - LHC/RHC (5/21): No significant CAD; mean RA 6, PA 31/13, mean PCWP 13, CI 3.37.  - Echo (3/23): EF 35-40%, diffuse hypokinesis, mildly decreased RV systolic function. 2. CVA: 9/15 with dysarthria.  Carotids 9/15 with no significant CAD.  - Carotid dopplers (10/17): mild plaque, normal subclavians.  3. LBBB: Medtronic CRT-D (10/2013) 4. Hyperlipidemia 5. Cholelithiasis 6. Peripheral arterial dopplers (6/19): Normal  SH: lives with her son and husband. Does not drink or smoke. Currently not working.   FH: Mom had A fib and Heart Failure. Deceased 40        Sister has lupus  ROS: All systems negative except as listed in HPI, PMH and Problem List.   Current Outpatient Medications  Medication Sig Dispense Refill   albuterol (VENTOLIN HFA) 108 (90 Base) MCG/ACT inhaler Inhale 2 puffs into the lungs  at bedtime as needed for wheezing or shortness of breath. 18 g 3   atorvastatin (LIPITOR) 80 MG tablet Take 1 tablet (80 mg total) by mouth daily at 6 PM. 90 tablet 3   carvedilol (COREG) 6.25 MG tablet Take 1 tablet (6.25 mg total) by mouth 2 (two) times daily with a meal. 180 tablet 3   cetirizine (ZYRTEC) 10 MG tablet Take 1 tablet (10 mg total) by mouth daily. 60 tablet 3   clopidogrel (PLAVIX) 75 MG tablet Take 1 tablet (75 mg total) by mouth daily. 90 tablet 3   dapagliflozin  propanediol (FARXIGA) 10 MG TABS tablet Take 1 tablet (10 mg total) by mouth daily. 90 tablet 0   fluticasone (FLONASE) 50 MCG/ACT nasal spray Place 2 sprays into both nostrils daily. 16 g 1   pantoprazole (PROTONIX) 40 MG tablet Take 1 tablet (40 mg total) by mouth daily. 60 tablet 3   sacubitril-valsartan (ENTRESTO) 49-51 MG Take 1 tablet by mouth 2 (two) times daily. 180 tablet 3   spironolactone (ALDACTONE) 25 MG tablet Take 1 tablet (25 mg total) by mouth daily. 90 tablet 2   furosemide (LASIX) 40 MG tablet Take 1 tablet (40 mg total) by mouth 2 (two) times daily. 180 tablet 3   potassium chloride SA (KLOR-CON M) 10 MEQ tablet Take 4 tablets (40 mEq total) by mouth daily. 300 tablet 3   No current facility-administered medications for this encounter.   BP 130/80   Pulse 65   Wt 83.7 kg (184 lb 9.6 oz)   SpO2 98%   BMI 31.20 kg/m   Wt Readings from Last 3 Encounters:  11/09/21 83.7 kg (184 lb 9.6 oz)  10/19/21 80.5 kg (177 lb 6.4 oz)  08/08/21 79.5 kg (175 lb 3.2 oz)   PHYSICAL EXAM: General: NAD Neck: JVP 8-9 cm, no thyromegaly or thyroid nodule.  Lungs: Clear to auscultation bilaterally with normal respiratory effort. CV: Nondisplaced PMI.  Heart regular S1/S2, no S3/S4, no murmur.  No peripheral edema.  No carotid bruit.  Normal pedal pulses.  Abdomen: Soft, nontender, no hepatosplenomegaly, no distention.  Skin: Intact without lesions or rashes.  Neurologic: Alert and oriented x 3.  Psych: Normal affect. Extremities: No clubbing or cyanosis.  HEENT: Normal.   ASSESSMENT & PLAN: 1. Chronic Systolic Heart Failure: Nonischemic cardiomyopathy s/p Medtronic CRT-D in 10/15, coronary angiography without CAD in 07/2013. Echo done in 2/16, EF 25-30%.  Repeat echo in 8/16 with EF up to 35-40%.  Echo in 4/19 showed EF up to 45-50%.  However, echo in 4/21 showed EF back down to 30-35%.  LHC/RHC in 5/21 showed no CAD, normal filling pressures, preserved cardiac output.  Of note,  Medtronic device had been reprogrammed to VVI as LBBB has been only intermittent => more recently, LBBB has been persistent and CRT was reactivated in 9/21. Echo 3/23 showed EF 35-40%, diffuse hypokinesis, mildly decreased RV systolic function.  NYHA III symptoms, volume overloaded by Optivol and exam, weight is up.  - Continue Coreg 6.25 mg bid, she did not tolerate increase to 9.375 mg bid.   - Continue Entresto 49/51 bid, would not increase with recent elevated creatinine.     - Increase Lasix to 60 qam/40 qpm x 1 day then 40 mg bid after that.  BMET/BNP today and BMET in 10 days.  - Restart KCl 40 daily (has not been taking).  - Continue spironolactone 25 mg daily.  - Continue dapagliflozin 10 mg daily.   2. CVA: ?  Embolic.  No atrial fibrillation has been detected so far on device.  She will continue Plavix for now. If future device interrogation shows atrial fibrillation, she will need to be anticoagulated.   3. Hyperlipidemia: Goal LDL < 70 with history of CVA.  Continue statin.  - Good lipids 3/23.  Follow up in 1 month with APP to reassess volume status.   [name]  11/09/2021

## 2021-11-14 ENCOUNTER — Ambulatory Visit (INDEPENDENT_AMBULATORY_CARE_PROVIDER_SITE_OTHER): Payer: Medicare Other

## 2021-11-14 DIAGNOSIS — Z95810 Presence of automatic (implantable) cardiac defibrillator: Secondary | ICD-10-CM

## 2021-11-14 DIAGNOSIS — I5020 Unspecified systolic (congestive) heart failure: Secondary | ICD-10-CM

## 2021-11-14 DIAGNOSIS — I5022 Chronic systolic (congestive) heart failure: Secondary | ICD-10-CM

## 2021-11-14 MED ORDER — FUROSEMIDE 40 MG PO TABS: 60.0000 mg | ORAL_TABLET | Freq: Two times a day (BID) | ORAL | 3 refills | Status: DC

## 2021-11-14 MED ORDER — POTASSIUM CHLORIDE CRYS ER 10 MEQ PO TBCR: 60.0000 meq | EXTENDED_RELEASE_TABLET | Freq: Every day | ORAL | 3 refills | Status: DC

## 2021-11-14 NOTE — Progress Notes (Signed)
EPIC Encounter for ICM Monitoring  Patient Name: [name] is a 71 y.o. female Date: 11/14/2021 Primary Care Physican: [name], MD Primary Cardiologist: Aundra Dubin Electrophysiologist: Vergie Living Pacing: 96.7%         04/11/2021 Office Weight: 175 lbs 06/20/2021 Weight: 176 lbs  08/16/2021 Weight: 174 lbs 08/22/2021 Weight: 175 lbs 11/14/2021 Weight: 181 lbs                                                          Spoke with husband and heart failure questions reviewed.  Transmission results reviewed.  Pt asymptomatic for fluid accumulation.  Reports feeling well at this time and voices no complaints.       Optivol thoracic impedance suggesting possible fluid accumulation starting 10/16.   Confirmed she took Lasix 60 mg am/40 mg pm x 1 day as recommended by Dr Aundra Dubin at 10/25 OV.   Prescribed:  Furosemide 40 mg take 1 tablet (40 mg total) by mouth twice a day  Potassium 10 mEq take 4 tablets (40 mEq total) daily Spironolactone 25 mg take 1 tablet by mouth daily (Restarted 8/1)   Labs: 11/21/2021 BMET Scheduled at HF clinic 11/09/2021 Creatinine 1.28, BUN 20, Potassium 3.1, Sodium 140, GFR 45 10/18/2021 Creatinine 1.96, BUN 46, Potassium 4.4, Sodium 134, GFR 27 10/05/2021 Creatinine 1.26, BUN 14, Potassium 3.1, Sodium 140, GFR 45 08/26/2021 Creatinine 1.37, BUN 18, Potassium 4.0, Sodium 141, GFR 41 08/11/2021 Creatinine 1.29, BUN 27, Potassium 4.4, Sodium 137, GFR 44 A complete set of results can be found in Results Review.   Recommendations:  Sent to Dr Aundra Dubin for review and recommendations regarding any adjustments needed for Lasix and Potassium     Follow-up plan: ICM clinic phone appointment on 11/28/2021 to recheck fluid levels   91 day device clinic remote transmission 01/03/2022.   EP/Cardiology Office Visits:   12/12/2021 with Dr Aundra Dubin.  12/28/2021 with Dr Caryl Comes    Copy of ICM check sent to Dr. Caryl Comes.  3 month ICM trend: 11/14/2021.    12-14 Month ICM trend:      Rosalene Billings, RN 11/14/2021 3:43 PM

## 2021-11-14 NOTE — Progress Notes (Signed)
Increase Lasix to 60 mg bid and increase KCl by 20 mEq. BMET scheduled soon.

## 2021-11-14 NOTE — Progress Notes (Signed)
Spoke with husband and provided Dr Claris Gladden recommendation to increase lasix to 60 mg twice a day and add 20 mEq potassium which would equal 60 mEq once a day.  He verbalized understanding and repeated instructions back correctly.   Prescriptions updated.  Advised to keep BMET appointment for next week.  Will recheck fluid levels 11/13.

## 2021-11-21 ENCOUNTER — Encounter (HOSPITAL_COMMUNITY): Payer: Self-pay

## 2021-11-21 ENCOUNTER — Telehealth (HOSPITAL_COMMUNITY): Payer: Self-pay | Admitting: *Deleted

## 2021-11-21 ENCOUNTER — Other Ambulatory Visit: Payer: Self-pay

## 2021-11-21 ENCOUNTER — Inpatient Hospital Stay (HOSPITAL_COMMUNITY): Payer: Medicare Other

## 2021-11-21 ENCOUNTER — Inpatient Hospital Stay (HOSPITAL_COMMUNITY)
Admission: EM | Admit: 2021-11-21 | Discharge: 2021-11-24 | DRG: 683 | Disposition: A | Discharge status: Home / Self Care | Payer: Medicare Other | Source: Ambulatory Visit | Attending: Family Medicine | Admitting: Family Medicine

## 2021-11-21 ENCOUNTER — Ambulatory Visit (HOSPITAL_COMMUNITY)
Admission: RE | Admit: 2021-11-21 | Discharge: 2021-11-21 | Disposition: A | Discharge status: Home / Self Care | Payer: Medicare Other | Source: Ambulatory Visit | Attending: Cardiology | Admitting: Cardiology

## 2021-11-21 ENCOUNTER — Emergency Department (HOSPITAL_COMMUNITY): Payer: Medicare Other

## 2021-11-21 DIAGNOSIS — E875 Hyperkalemia: Secondary | ICD-10-CM

## 2021-11-21 DIAGNOSIS — Z6831 Body mass index (BMI) 31.0-31.9, adult: Secondary | ICD-10-CM | POA: Diagnosis not present

## 2021-11-21 DIAGNOSIS — Z95810 Presence of automatic (implantable) cardiac defibrillator: Secondary | ICD-10-CM | POA: Diagnosis not present

## 2021-11-21 DIAGNOSIS — I4891 Unspecified atrial fibrillation: Secondary | ICD-10-CM | POA: Diagnosis present

## 2021-11-21 DIAGNOSIS — I340 Nonrheumatic mitral (valve) insufficiency: Secondary | ICD-10-CM | POA: Diagnosis present

## 2021-11-21 DIAGNOSIS — I959 Hypotension, unspecified: Secondary | ICD-10-CM | POA: Diagnosis present

## 2021-11-21 DIAGNOSIS — E871 Hypo-osmolality and hyponatremia: Secondary | ICD-10-CM | POA: Diagnosis present

## 2021-11-21 DIAGNOSIS — Z886 Allergy status to analgesic agent status: Secondary | ICD-10-CM

## 2021-11-21 DIAGNOSIS — I428 Other cardiomyopathies: Secondary | ICD-10-CM | POA: Diagnosis present

## 2021-11-21 DIAGNOSIS — M1990 Unspecified osteoarthritis, unspecified site: Secondary | ICD-10-CM | POA: Diagnosis present

## 2021-11-21 DIAGNOSIS — K219 Gastro-esophageal reflux disease without esophagitis: Secondary | ICD-10-CM | POA: Diagnosis present

## 2021-11-21 DIAGNOSIS — Z7902 Long term (current) use of antithrombotics/antiplatelets: Secondary | ICD-10-CM

## 2021-11-21 DIAGNOSIS — E785 Hyperlipidemia, unspecified: Secondary | ICD-10-CM | POA: Diagnosis present

## 2021-11-21 DIAGNOSIS — N179 Acute kidney failure, unspecified: Secondary | ICD-10-CM | POA: Diagnosis present

## 2021-11-21 DIAGNOSIS — I6930 Unspecified sequelae of cerebral infarction: Secondary | ICD-10-CM | POA: Diagnosis not present

## 2021-11-21 DIAGNOSIS — Z7984 Long term (current) use of oral hypoglycemic drugs: Secondary | ICD-10-CM

## 2021-11-21 DIAGNOSIS — E861 Hypovolemia: Secondary | ICD-10-CM | POA: Diagnosis present

## 2021-11-21 DIAGNOSIS — N189 Chronic kidney disease, unspecified: Secondary | ICD-10-CM | POA: Diagnosis not present

## 2021-11-21 DIAGNOSIS — Z8249 Family history of ischemic heart disease and other diseases of the circulatory system: Secondary | ICD-10-CM | POA: Diagnosis not present

## 2021-11-21 DIAGNOSIS — I447 Left bundle-branch block, unspecified: Secondary | ICD-10-CM | POA: Diagnosis present

## 2021-11-21 DIAGNOSIS — E669 Obesity, unspecified: Secondary | ICD-10-CM | POA: Diagnosis present

## 2021-11-21 DIAGNOSIS — Z79899 Other long term (current) drug therapy: Secondary | ICD-10-CM

## 2021-11-21 DIAGNOSIS — N1831 Chronic kidney disease, stage 3a: Secondary | ICD-10-CM | POA: Diagnosis present

## 2021-11-21 DIAGNOSIS — K529 Noninfective gastroenteritis and colitis, unspecified: Secondary | ICD-10-CM | POA: Diagnosis present

## 2021-11-21 DIAGNOSIS — Z91018 Allergy to other foods: Secondary | ICD-10-CM

## 2021-11-21 DIAGNOSIS — I130 Hypertensive heart and chronic kidney disease with heart failure and stage 1 through stage 4 chronic kidney disease, or unspecified chronic kidney disease: Secondary | ICD-10-CM | POA: Diagnosis present

## 2021-11-21 DIAGNOSIS — I5022 Chronic systolic (congestive) heart failure: Secondary | ICD-10-CM | POA: Insufficient documentation

## 2021-11-21 DIAGNOSIS — I5020 Unspecified systolic (congestive) heart failure: Secondary | ICD-10-CM | POA: Diagnosis present

## 2021-11-21 DIAGNOSIS — E860 Dehydration: Secondary | ICD-10-CM | POA: Diagnosis present

## 2021-11-21 HISTORY — DX: Hyperkalemia: E87.5

## 2021-11-21 HISTORY — DX: Acute kidney failure, unspecified: N17.9

## 2021-11-21 HISTORY — DX: Unspecified atrial fibrillation: I48.91

## 2021-11-21 LAB — URINALYSIS, ROUTINE W REFLEX MICROSCOPIC
Bilirubin Urine: NEGATIVE
Glucose, UA: 150 mg/dL — AB
Ketones, ur: NEGATIVE mg/dL
Leukocytes,Ua: NEGATIVE
Nitrite: NEGATIVE
Protein, ur: NEGATIVE mg/dL
Specific Gravity, Urine: 1.008 (ref 1.005–1.030)
pH: 7 (ref 5.0–8.0)

## 2021-11-21 LAB — COMPREHENSIVE METABOLIC PANEL
ALT: 13 U/L (ref 0–44)
AST: 15 U/L (ref 15–41)
Albumin: 4 g/dL (ref 3.5–5.0)
Alkaline Phosphatase: 113 U/L (ref 38–126)
Anion gap: 13 (ref 5–15)
BUN: 74 mg/dL — ABNORMAL HIGH (ref 8–23)
CO2: 17 mmol/L — ABNORMAL LOW (ref 22–32)
Calcium: 9.5 mg/dL (ref 8.9–10.3)
Chloride: 105 mmol/L (ref 98–111)
Creatinine, Ser: 5.69 mg/dL — ABNORMAL HIGH (ref 0.44–1.00)
GFR, Estimated: 7 mL/min — ABNORMAL LOW (ref >60)
Glucose, Bld: 125 mg/dL — ABNORMAL HIGH (ref 70–99)
Potassium: 5.9 mmol/L — ABNORMAL HIGH (ref 3.5–5.1)
Sodium: 135 mmol/L (ref 135–145)
Total Bilirubin: 1.3 mg/dL — ABNORMAL HIGH (ref 0.3–1.2)
Total Protein: 8.1 g/dL (ref 6.5–8.1)

## 2021-11-21 LAB — POTASSIUM: Potassium: 4.9 mmol/L (ref 3.5–5.1)

## 2021-11-21 LAB — I-STAT CHEM 8, ED
BUN: 80 mg/dL — ABNORMAL HIGH (ref 8–23)
Calcium, Ion: 1.15 mmol/L (ref 1.15–1.40)
Chloride: 106 mmol/L (ref 98–111)
Creatinine, Ser: 6.1 mg/dL — ABNORMAL HIGH (ref 0.44–1.00)
Glucose, Bld: 120 mg/dL — ABNORMAL HIGH (ref 70–99)
HCT: 35 % — ABNORMAL LOW (ref 36.0–46.0)
Hemoglobin: 11.9 g/dL — ABNORMAL LOW (ref 12.0–15.0)
Potassium: 5.8 mmol/L — ABNORMAL HIGH (ref 3.5–5.1)
Sodium: 135 mmol/L (ref 135–145)
TCO2: 16 mmol/L — ABNORMAL LOW (ref 22–32)

## 2021-11-21 LAB — BASIC METABOLIC PANEL
Anion gap: 12 (ref 5–15)
BUN: 68 mg/dL — ABNORMAL HIGH (ref 8–23)
CO2: 16 mmol/L — ABNORMAL LOW (ref 22–32)
Calcium: 9.2 mg/dL (ref 8.9–10.3)
Chloride: 105 mmol/L (ref 98–111)
Creatinine, Ser: 5.32 mg/dL — ABNORMAL HIGH (ref 0.44–1.00)
GFR, Estimated: 8 mL/min — ABNORMAL LOW (ref >60)
Glucose, Bld: 101 mg/dL — ABNORMAL HIGH (ref 70–99)
Potassium: 7.4 mmol/L (ref 3.5–5.1)
Sodium: 133 mmol/L — ABNORMAL LOW (ref 135–145)

## 2021-11-21 LAB — CBC
HCT: 37.4 % (ref 36.0–46.0)
Hemoglobin: 12.2 g/dL (ref 12.0–15.0)
MCH: 29.5 pg (ref 26.0–34.0)
MCHC: 32.6 g/dL (ref 30.0–36.0)
MCV: 90.3 fL (ref 80.0–100.0)
Platelets: 346 10*3/uL (ref 150–400)
RBC: 4.14 MIL/uL (ref 3.87–5.11)
RDW: 14.5 % (ref 11.5–15.5)
WBC: 12.7 10*3/uL — ABNORMAL HIGH (ref 4.0–10.5)
nRBC: 0 % (ref 0.0–0.2)

## 2021-11-21 LAB — CBG MONITORING, ED: Glucose-Capillary: 112 mg/dL — ABNORMAL HIGH (ref 70–99)

## 2021-11-21 MED ORDER — ONDANSETRON HCL 4 MG/2ML IJ SOLN: 4.0000 mg | Freq: Four times a day (QID) | INTRAMUSCULAR | Status: DC | PRN

## 2021-11-21 MED ORDER — ONDANSETRON HCL 4 MG/2ML IJ SOLN
4.0000 mg | Freq: Once | INTRAMUSCULAR | Status: AC
  Administered 2021-11-21: 4 mg via INTRAVENOUS
  Filled 2021-11-21: qty 2

## 2021-11-21 MED ORDER — SODIUM CHLORIDE 0.9 % IV SOLN: 250.0000 mL | INTRAVENOUS | Status: DC | PRN

## 2021-11-21 MED ORDER — SODIUM CHLORIDE 0.9% FLUSH
3.0000 mL | Freq: Two times a day (BID) | INTRAVENOUS | Status: DC
  Administered 2021-11-21 – 2021-11-24 (×5): 3 mL via INTRAVENOUS

## 2021-11-21 MED ORDER — MIDODRINE HCL 5 MG PO TABS: 10.0000 mg | ORAL_TABLET | Freq: Three times a day (TID) | ORAL | Status: AC

## 2021-11-21 MED ORDER — SODIUM ZIRCONIUM CYCLOSILICATE 10 G PO PACK: 10.0000 g | PACK | Freq: Once | ORAL | Status: DC

## 2021-11-21 MED ORDER — FLUTICASONE PROPIONATE 50 MCG/ACT NA SUSP
2.0000 | Freq: Every day | NASAL | Status: DC
  Filled 2021-11-21 (×2): qty 16

## 2021-11-21 MED ORDER — SODIUM ZIRCONIUM CYCLOSILICATE 10 G PO PACK
10.0000 g | PACK | Freq: Once | ORAL | Status: AC
  Administered 2021-11-21: 10 g via ORAL
  Filled 2021-11-21: qty 1

## 2021-11-21 MED ORDER — ALBUTEROL SULFATE (2.5 MG/3ML) 0.083% IN NEBU: 2.5000 mg | INHALATION_SOLUTION | Freq: Every evening | RESPIRATORY_TRACT | Status: DC | PRN

## 2021-11-21 MED ORDER — SODIUM CHLORIDE 0.9% FLUSH
3.0000 mL | INTRAVENOUS | Status: DC | PRN
  Administered 2021-11-22 (×2): 3 mL via INTRAVENOUS

## 2021-11-21 MED ORDER — PANTOPRAZOLE SODIUM 40 MG PO TBEC
40.0000 mg | DELAYED_RELEASE_TABLET | Freq: Every day | ORAL | Status: DC
  Administered 2021-11-22: 40 mg via ORAL
  Filled 2021-11-21: qty 1

## 2021-11-21 MED ORDER — MELATONIN 3 MG PO TABS
3.0000 mg | ORAL_TABLET | Freq: Every day | ORAL | Status: DC
  Administered 2021-11-21 – 2021-11-23 (×3): 3 mg via ORAL
  Filled 2021-11-21 (×3): qty 1

## 2021-11-21 MED ORDER — MIDODRINE HCL 5 MG PO TABS
10.0000 mg | ORAL_TABLET | ORAL | Status: AC
  Administered 2021-11-21: 10 mg via ORAL
  Filled 2021-11-21: qty 2

## 2021-11-21 MED ORDER — ACETAMINOPHEN 325 MG PO TABS
650.0000 mg | ORAL_TABLET | ORAL | Status: DC | PRN
  Administered 2021-11-21 – 2021-11-22 (×2): 650 mg via ORAL
  Filled 2021-11-21 (×2): qty 2

## 2021-11-21 MED ORDER — DEXTROSE 50 % IV SOLN
1.0000 | Freq: Once | INTRAVENOUS | Status: AC
  Administered 2021-11-21: 50 mL via INTRAVENOUS
  Filled 2021-11-21: qty 50

## 2021-11-21 MED ORDER — SODIUM CHLORIDE 0.9 % IV SOLN: Freq: Once | INTRAVENOUS | Status: AC

## 2021-11-21 MED ORDER — HYDRALAZINE HCL 25 MG PO TABS: 25.0000 mg | ORAL_TABLET | Freq: Four times a day (QID) | ORAL | Status: DC | PRN

## 2021-11-21 MED ORDER — LORATADINE 10 MG PO TABS
5.0000 mg | ORAL_TABLET | Freq: Every day | ORAL | Status: DC
  Administered 2021-11-22 – 2021-11-24 (×3): 5 mg via ORAL
  Filled 2021-11-21 (×3): qty 1

## 2021-11-21 MED ORDER — INSULIN ASPART 100 UNIT/ML IV SOLN
5.0000 [IU] | Freq: Once | INTRAVENOUS | Status: AC
  Administered 2021-11-21: 5 [IU] via INTRAVENOUS

## 2021-11-21 MED ORDER — CLOPIDOGREL BISULFATE 75 MG PO TABS
75.0000 mg | ORAL_TABLET | Freq: Every day | ORAL | Status: DC
  Administered 2021-11-22 – 2021-11-24 (×3): 75 mg via ORAL
  Filled 2021-11-21 (×3): qty 1

## 2021-11-21 MED ORDER — SODIUM CHLORIDE 0.9 % IV BOLUS
500.0000 mL | Freq: Once | INTRAVENOUS | Status: AC
  Administered 2021-11-21: 500 mL via INTRAVENOUS

## 2021-11-21 MED ORDER — HEPARIN SODIUM (PORCINE) 5000 UNIT/ML IJ SOLN
5000.0000 [IU] | Freq: Two times a day (BID) | INTRAMUSCULAR | Status: DC
  Administered 2021-11-21 – 2021-11-24 (×6): 5000 [IU] via SUBCUTANEOUS
  Filled 2021-11-21 (×6): qty 1

## 2021-11-21 MED ORDER — ATORVASTATIN CALCIUM 80 MG PO TABS
80.0000 mg | ORAL_TABLET | Freq: Every day | ORAL | Status: DC
  Administered 2021-11-22 – 2021-11-23 (×2): 80 mg via ORAL
  Filled 2021-11-21: qty 1
  Filled 2021-11-21: qty 2

## 2021-11-21 NOTE — Telephone Encounter (Signed)
error 

## 2021-11-21 NOTE — H&P (Signed)
History and Physical    [name] [id number] DOB: 03/17/1950 DOA: 11/21/2021  PCP: [name], MD (Confirm with patient/family/NH records and if not entered, this has to be entered at [facility] point of entry) Patient coming from: Home  I have personally briefly reviewed patient's old medical records in [place]  Chief Complaint: I was sent here  HPI: [name] is a 71 y.o. female with medical history significant of chronic HFrEF LVEF 35-40% s/p CRT and on AICD, HTN, HLD, stroke on Plavix, sent from cardiology office for evaluation of hyperkalemia acute.  Patient on chronic Lasix 40 mg twice daily, and recently she has had recurrent hypokalemia for which she has been taking potassium supplement. 2 weeks ago she started to develop fluid retention, and 1 week ago, Lasix increased to 60 mg twice daily and KCl increased to 20 mg daily.  She has been stable until Friday night, after eating a "bad pizza" she started to develop severe watery diarrhea, repeated nauseous vomiting of stomach content and became dehydrated over last 2 days.  But she did continue to her CHF/BP meds until this morning.  Her diarrhea finally resolved this morning, when she had a normal BM.  She denies any chest pain shortness of breath.  She reported's been feeling dizzy since last night and went to see cardiology this morning, when her labs showed K= 7.0 and immediately sent to ED.  ED Course: Blood pressure 85/52, borderline tachycardia afebrile nonhypoxic.  Creatinine 5.3 compared to baseline 1.2, K7.4.  Received calcium binder, hyperkalemia cocktail and repeat K this afternoon 5.8.  No significant T wave changes on EKG.  Review of Systems: As per HPI otherwise 14 point review of systems negative.    Past Medical History:  Diagnosis Date   A-fib [facility])    Arthritis    Automatic implantable cardioverter-defibrillator in situ    Cardiac resynchronization therapy defibrillator (CRT-D) in place     a. placed on 11/05/13.   GERD (gastroesophageal reflux disease)    HLD (hyperlipidemia)    Hypotension    LBBB (left bundle branch block)    Mitral regurgitation    a. Mild-mod MR by echo 07/2013.   PONV (postoperative nausea and vomiting)    Stroke ([place]) 0000000   Systolic CHF ([place])    a. Dx 07/2013 - due to NICM. Cath normal cors. EF 25%.    Past Surgical History:  Procedure Laterality Date   BI-VENTRICULAR IMPLANTABLE CARDIOVERTER DEFIBRILLATOR N/A 11/05/2013   Procedure: BI-VENTRICULAR IMPLANTABLE CARDIOVERTER DEFIBRILLATOR  (CRT-D);  Surgeon: Deboraha Sprang, MD;  Location: [facility] CATH LAB;  Service: Cardiovascular;  Laterality: N/A;   BI-VENTRICULAR IMPLANTABLE CARDIOVERTER DEFIBRILLATOR  (CRT-D)  11/05/2013   CARDIAC CATHETERIZATION  07/2013   KNEE ARTHROSCOPY Right    LEFT AND RIGHT HEART CATHETERIZATION WITH CORONARY ANGIOGRAM N/A 08/08/2013   Procedure: LEFT AND RIGHT HEART CATHETERIZATION WITH CORONARY ANGIOGRAM;  Surgeon: Burnell Blanks, MD;  Location: [facility] CATH LAB;  Service: Cardiovascular;  Laterality: N/A;   RIGHT/LEFT HEART CATH AND CORONARY ANGIOGRAPHY N/A 06/05/2019   Procedure: RIGHT/LEFT HEART CATH AND CORONARY ANGIOGRAPHY;  Surgeon: Larey Dresser, MD;  Location: [place] CV LAB;  Service: Cardiovascular;  Laterality: N/A;   TEE WITHOUT CARDIOVERSION N/A 09/26/2013   Procedure: TRANSESOPHAGEAL ECHOCARDIOGRAM (TEE);  Surgeon: Larey Dresser, MD;  Location: [place];  Service: Cardiovascular;  Laterality: N/A;   TONSILLECTOMY     WRIST ARTHROSCOPY  12/21/2010   Procedure: ARTHROSCOPY WRIST;  Surgeon: Wynonia Sours, MD;  Location: [place];  Service: Orthopedics;  Laterality: Right;  right wrist, repair TFCC on right, open reconstruction ECU sheath     reports that she has never smoked. She has never used smokeless tobacco. She reports that she does not drink alcohol and does not use drugs.  Allergies  Allergen Reactions   Onion Other  (See Comments)    gas   Tomato Other (See Comments)    reflux   Voltaren [Diclofenac] Rash    Family History  Problem Relation Age of Onset   Congestive Heart Failure Mother    Cancer Mother 67       Breast   Cancer Father 16       Oat Cell CA   Lupus Sister    Heart disease Brother      Prior to Admission medications   Medication Sig Start Date End Date Taking? Authorizing Provider  albuterol (VENTOLIN HFA) 108 (90 Base) MCG/ACT inhaler Inhale 2 puffs into the lungs at bedtime as needed for wheezing or shortness of breath. 10/19/21   [name], MD  atorvastatin (LIPITOR) 80 MG tablet Take 1 tablet (80 mg total) by mouth daily at 6 PM. 10/19/21   [name], MD  carvedilol (COREG) 6.25 MG tablet Take 1 tablet (6.25 mg total) by mouth 2 (two) times daily with a meal. 10/19/21   [name], MD  cetirizine (ZYRTEC) 10 MG tablet Take 1 tablet (10 mg total) by mouth daily. 10/19/21   [name], MD  clopidogrel (PLAVIX) 75 MG tablet Take 1 tablet (75 mg total) by mouth daily. 10/19/21   [name], MD  dapagliflozin propanediol (FARXIGA) 10 MG TABS tablet Take 1 tablet (10 mg total) by mouth daily. 10/19/21   [name], MD  fluticasone (FLONASE) 50 MCG/ACT nasal spray Place 2 sprays into both nostrils daily. 10/19/21   [name], MD  furosemide (LASIX) 40 MG tablet Take 1.5 tablets (60 mg total) by mouth 2 (two) times daily. 11/14/21   Larey Dresser, MD  pantoprazole (PROTONIX) 40 MG tablet Take 1 tablet (40 mg total) by mouth daily. 10/19/21   [name], MD  potassium chloride (KLOR-CON M) 10 MEQ tablet Take 6 tablets (60 mEq total) by mouth daily. 11/14/21   Larey Dresser, MD  sacubitril-valsartan (ENTRESTO) 49-51 MG Take 1 tablet by mouth 2 (two) times daily. 10/19/21   [name], MD  spironolactone (ALDACTONE) 25 MG tablet Take 1 tablet (25 mg total) by mouth daily. 10/19/21 01/17/22  [name], MD    Physical  Exam: Vitals:   11/21/21 1700 11/21/21 1730 11/21/21 1745 11/21/21 1800  BP: (!) 86/60 (!) 105/55 (!) 111/100 (!) 101/54  Pulse: 63 85 95 92  Resp: 12 16 18 18   Temp:      TempSrc:      SpO2: 100% 98% 100% 100%  Weight:      Height:        Constitutional: NAD, calm, comfortable Vitals:   11/21/21 1700 11/21/21 1730 11/21/21 1745 11/21/21 1800  BP: (!) 86/60 (!) 105/55 (!) 111/100 (!) 101/54  Pulse: 63 85 95 92  Resp: 12 16 18 18   Temp:      TempSrc:      SpO2: 100% 98% 100% 100%  Weight:      Height:       Eyes: PERRL, lids and conjunctivae normal ENMT: Mucous membranes  are dry. Posterior pharynx clear of any exudate or lesions.Normal dentition.  Neck: normal, supple, no masses, no thyromegaly Respiratory: clear to auscultation bilaterally, no wheezing, no crackles. Normal respiratory effort. No accessory muscle use.  Cardiovascular: Regular rate and rhythm, no murmurs / rubs / gallops. No extremity edema. 2+ pedal pulses. No carotid bruits.  Abdomen: no tenderness, no masses palpated. No hepatosplenomegaly. Bowel sounds positive.  Musculoskeletal: no clubbing / cyanosis. No joint deformity upper and lower extremities. Good ROM, no contractures. Normal muscle tone.  Skin: no rashes, lesions, ulcers. No induration Neurologic: CN 2-12 grossly intact. Sensation intact, DTR normal. Strength 5/5 in all 4.  Psychiatric: Normal judgment and insight. Alert and oriented x 3. Normal mood.    Labs on Admission: I have personally reviewed following labs and imaging studies  CBC: Recent Labs  Lab 11/21/21 1625 11/21/21 1701  WBC 12.7*  --   HGB 12.2 11.9*  HCT 37.4 35.0*  MCV 90.3  --   PLT 346  --    Basic Metabolic Panel: Recent Labs  Lab 11/21/21 0841 11/21/21 1625 11/21/21 1701  NA 133* 135 135  K 7.4* 5.9* 5.8*  CL 105 105 106  CO2 16* 17*  --   GLUCOSE 101* 125* 120*  BUN 68* 74* 80*  CREATININE 5.32* 5.69* 6.10*  CALCIUM 9.2 9.5  --    GFR: Estimated  Creatinine Clearance: 8.7 mL/min (A) (by C-G formula based on SCr of 6.1 mg/dL (H)). Liver Function Tests: Recent Labs  Lab 11/21/21 1625  AST 15  ALT 13  ALKPHOS 113  BILITOT 1.3*  PROT 8.1  ALBUMIN 4.0   No results for input(s): "LIPASE", "AMYLASE" in the last 168 hours. No results for input(s): "AMMONIA" in the last 168 hours. Coagulation Profile: No results for input(s): "INR", "PROTIME" in the last 168 hours. Cardiac Enzymes: No results for input(s): "CKTOTAL", "CKMB", "CKMBINDEX", "TROPONINI" in the last 168 hours. BNP (last 3 results) No results for input(s): "PROBNP" in the last 8760 hours. HbA1C: No results for input(s): "HGBA1C" in the last 72 hours. CBG: Recent Labs  Lab 11/21/21 1630  GLUCAP 112*   Lipid Profile: No results for input(s): "CHOL", "HDL", "LDLCALC", "TRIG", "CHOLHDL", "LDLDIRECT" in the last 72 hours. Thyroid Function Tests: No results for input(s): "TSH", "T4TOTAL", "FREET4", "T3FREE", "THYROIDAB" in the last 72 hours. Anemia Panel: No results for input(s): "VITAMINB12", "FOLATE", "FERRITIN", "TIBC", "IRON", "RETICCTPCT" in the last 72 hours. Urine analysis: No results found for: "COLORURINE", "APPEARANCEUR", "LABSPEC", "PHURINE", "GLUCOSEU", "HGBUR", "BILIRUBINUR", "KETONESUR", "PROTEINUR", "UROBILINOGEN", "NITRITE", "LEUKOCYTESUR"  Radiological Exams on Admission: DG Chest Port 1 View  Result Date: 11/21/2021 CLINICAL DATA:  Shortness of breath. EXAM: PORTABLE CHEST 1 VIEW COMPARISON:  September 16, 2018. FINDINGS: The heart size and mediastinal contours are within normal limits. Stable position of left-sided defibrillator. Both lungs are clear. The visualized skeletal structures are unremarkable. IMPRESSION: No active disease. Electronically Signed   By: Marijo Conception M.D.   On: 11/21/2021 16:29    EKG: Independently reviewed.  Sinus, low voltage, no significant T wave changes  Assessment/Plan Principal Problem:   AKI (acute kidney injury)  ([place]) Active Problems:   Hyperkalemia  (please populate well all problems here in Problem List. (For example, if patient is on BP meds at home and you resume or decide to hold them, it is a problem that needs to be her. Same for CAD, COPD, HLD and so on)  AKI, hyperkalemia, rule out ATN -Secondary to severe dehydration and  volume depletion from diarrhea -Clinically still volume depleted after receiving 1 L total of IV bolus, agreed with continue maintenance IV fluid 125 mL/HR for tonight, monitor volume status, recheck chest x-ray tomorrow morning. -Renal ultrasound, UA pending -For hyperkalemia, received Lokelma x1, potassium level trending down, recheck K level tonight and tomorrow morning. -Strict I&O -Hold off all her BP/CHF meds, including Lasix and Entresto beta-blocker and Aldactone. -No indication for emergency HD as of now, as hyperkalemia is improving with hydration and Lokelma, and patient still significantly volume depleted, but consider nephrology consultation if no significant improvement of kidney function tomorrow.  Acute gastroenteritis -Likely food toxin, all GI symptoms resolved by now.  Chronic HFrEF -With significant volume depletion and AKI, management as above.  History of stroke -No issue, continue Plavix and statin  Obesity -BMI=31, recommend calorie control  DVT prophylaxis: Heparin subcu Code Status: Full code Family Communication: Husband over phone Disposition Plan: Patient is sick with hyperkalemia and AKI, with CHF, recurrent careful IV hydration and hyperkalemia treatment as inpatient, expect more than 2 midnight hospital stay Consults called: None Admission status: Tele admit   Lequita Halt MD [facility] Pager [phone]  11/21/2021, 6:42 PM

## 2021-11-21 NOTE — ED Triage Notes (Signed)
Pt reports she was told her by doctor to come to ER today because her potassium was 7 and her creatinine is 5. She is not a dialysis patient. She reports feeling dizzy with an episode of emesis today. BP 85/53 in triage

## 2021-11-21 NOTE — ED Notes (Signed)
Assumed care of this pt at this time. Received report from [place], RN 

## 2021-11-21 NOTE — ED Provider Triage Note (Signed)
Emergency Medicine Provider Triage Evaluation Note  [name] , a 71 y.o. female  was evaluated in triage.  Pt complains of increased fatigue, nausea, vomiting xseveral days. Reports increased Kcl from 41mEq once daily to 35mEq BID. Was told to come into ED 2/2 to poor labs. No chest pain. Chronic SOB. No increased urination. Reports N/V since Friday after eating a pizza.  Review of Systems  Positive: Nausea, vomiting Negative: Fever, chills  Physical Exam  BP (!) 85/53 (BP Location: Right Arm)   Pulse 93   Temp 97.6 F (36.4 C) (Oral)   Resp 16   Ht 5\' 4"  (1.626 m)   Wt 83.5 kg   SpO2 99%   BMI 31.58 kg/m  Gen:   Awake, ill appearing Resp:  Increased effort MSK:   Moves extremities without difficulty  Other:    Medical Decision Making  Medically screening exam initiated at 3:50 PM.  Appropriate orders placed.  Sedra Morfin Ackerley was informed that the remainder of the evaluation will be completed by another provider, this initial triage assessment does not replace that evaluation, and the importance of remaining in the ED until their evaluation is complete.     Osvaldo Shipper, [place] 11/21/21 1600

## 2021-11-21 NOTE — ED Notes (Signed)
Pt c/o left ankle pain at this time.

## 2021-11-21 NOTE — ED Notes (Signed)
Dr. Nevada Crane informed of pt's low trending BP. Awaiting orders.

## 2021-11-21 NOTE — ED Notes (Signed)
Pt provided with a drink at this time. Resting comfortably in bed.

## 2021-11-21 NOTE — ED Provider Notes (Signed)
[facility] [name] is a 71 y.o. female.  71 year old female with prior medical history as detailed below presents for evaluation.  Patient was sent to the ED after blood draw this morning.  Patient was advised that labs this morning demonstrated elevated potassium and elevated creatinine.  Patient is also complaining of feeling weak and fatigued.  She reports persistent nausea for the last 2 to 3 days.  She reports some loose stool over the weekend.  She reports 1-2 episodes of emesis over the weekend as well.  She reports normal urination.  She denies chest pain or shortness of breath.  She also complains of feeling weak and dizzy with rapid standing.  In triage, patient's blood pressure is noted to be in the 80s over 50s.  The history is provided by the patient, medical records and the spouse.  Abnormal Lab Time since result:  Testing of 7, creatinine of 5      Home Medications Prior to Admission medications   Medication Sig Start Date End Date Taking? Authorizing Provider  albuterol (VENTOLIN HFA) 108 (90 Base) MCG/ACT inhaler Inhale 2 puffs into the lungs at bedtime as needed for wheezing or shortness of breath. 10/19/21   [name], MD  atorvastatin (LIPITOR) 80 MG tablet Take 1 tablet (80 mg total) by mouth daily at 6 PM. 10/19/21   [name], MD  carvedilol (COREG) 6.25 MG tablet Take 1 tablet (6.25 mg total) by mouth 2 (two) times daily with a meal. 10/19/21   [name], MD  cetirizine (ZYRTEC) 10 MG tablet Take 1 tablet (10 mg total) by mouth daily. 10/19/21   [name], MD  clopidogrel (PLAVIX) 75 MG tablet Take 1 tablet (75 mg total) by mouth daily. 10/19/21   [name], MD  dapagliflozin propanediol (FARXIGA) 10 MG TABS tablet Take 1 tablet (10  mg total) by mouth daily. 10/19/21   [name], MD  fluticasone (FLONASE) 50 MCG/ACT nasal spray Place 2 sprays into both nostrils daily. 10/19/21   [name], MD  furosemide (LASIX) 40 MG tablet Take 1.5 tablets (60 mg total) by mouth 2 (two) times daily. 11/14/21   Larey Dresser, MD  pantoprazole (PROTONIX) 40 MG tablet Take 1 tablet (40 mg total) by mouth daily. 10/19/21   [name], MD  potassium chloride (KLOR-CON M) 10 MEQ tablet Take 6 tablets (60 mEq total) by mouth daily. 11/14/21   Larey Dresser, MD  sacubitril-valsartan (ENTRESTO) 49-51 MG Take 1 tablet by mouth 2 (two) times daily. 10/19/21   [name], MD  spironolactone (ALDACTONE) 25 MG tablet Take 1 tablet (25 mg total) by mouth daily. 10/19/21 01/17/22  [name], MD      Allergies    Onion, Tomato, and Voltaren [diclofenac]    Review of Systems   Review of Systems  All other systems reviewed and are negative.   Physical Exam Updated Vital Signs BP (!) 87/53   Pulse 83   Temp 97.6 F (36.4 C) (Oral)   Resp (!) 21   Ht 5\' 4"  (1.626 m)   Wt 83.5 kg   SpO2 99%   BMI 31.58 kg/m  Physical Exam Vitals and nursing note reviewed.  Constitutional:  General: She is not in acute distress.    Appearance: Normal appearance. She is well-developed.  HENT:     Head: Normocephalic and atraumatic.  Eyes:     Conjunctiva/sclera: Conjunctivae normal.     Pupils: Pupils are equal, round, and reactive to light.  Cardiovascular:     Rate and Rhythm: Normal rate and regular rhythm.     Heart sounds: Normal heart sounds.  Pulmonary:     Effort: Pulmonary effort is normal. No respiratory distress.     Breath sounds: Normal breath sounds.  Abdominal:     General: There is no distension.     Palpations: Abdomen is soft.     Tenderness: There is no abdominal tenderness.  Musculoskeletal:        General: No deformity. Normal range of motion.     Cervical back: Normal range of motion and  neck supple.  Skin:    General: Skin is warm and dry.  Neurological:     General: No focal deficit present.     Mental Status: She is alert and oriented to person, place, and time.     ED Results / Procedures / Treatments   Labs (all labs ordered are listed, but only abnormal results are displayed) Labs Reviewed  CBC - Abnormal; Notable for the following components:      Result Value   WBC 12.7 (*)    All other components within normal limits  COMPREHENSIVE METABOLIC PANEL - Abnormal; Notable for the following components:   Potassium 5.9 (*)    CO2 17 (*)    Glucose, Bld 125 (*)    BUN 74 (*)    Creatinine, Ser 5.69 (*)    Total Bilirubin 1.3 (*)    GFR, Estimated 7 (*)    All other components within normal limits  CBG MONITORING, ED - Abnormal; Notable for the following components:   Glucose-Capillary 112 (*)    All other components within normal limits  I-STAT CHEM 8, ED - Abnormal; Notable for the following components:   Potassium 5.8 (*)    BUN 80 (*)    Creatinine, Ser 6.10 (*)    Glucose, Bld 120 (*)    TCO2 16 (*)    Hemoglobin 11.9 (*)    HCT 35.0 (*)    All other components within normal limits  URINALYSIS, ROUTINE W REFLEX MICROSCOPIC    EKG None  Radiology DG Chest Port 1 View  Result Date: 11/21/2021 CLINICAL DATA:  Shortness of breath. EXAM: PORTABLE CHEST 1 VIEW COMPARISON:  September 16, 2018. FINDINGS: The heart size and mediastinal contours are within normal limits. Stable position of left-sided defibrillator. Both lungs are clear. The visualized skeletal structures are unremarkable. IMPRESSION: No active disease. Electronically Signed   By: Lupita Raider M.D.   On: 11/21/2021 16:29    Procedures Procedures    Medications Ordered in ED Medications  sodium chloride 0.9 % bolus 500 mL (has no administration in time range)  sodium chloride 0.9 % bolus 500 mL (500 mLs Intravenous New Bag/Given 11/21/21 1624)  ondansetron (ZOFRAN) injection 4 mg (4  mg Intravenous Given 11/21/21 1624)    ED Course/ Medical Decision Making/ A&P                           Medical Decision Making Amount and/or Complexity of Data Reviewed Labs: ordered. Radiology: ordered.  Risk OTC drugs. Prescription drug management.    Medical Screen Complete  This patient presented to the ED with complaint of elevated potassium, elevated creatinine.  This complaint involves an extensive number of treatment options. The initial differential diagnosis includes, but is not limited to, metabolic abnormality, dehydration, etc.  This presentation is: Acute, Chronic, Self-Limited, Previously Undiagnosed, Uncertain Prognosis, Complicated, Systemic Symptoms, and Threat to Life/Bodily Function  Patient is presenting for evaluation of reported hyperkalemia and acute kidney injury.  Patient with known history of CHF, cardiomyopathy recent increase in Lasix and potassium dosing by cardiology.  Patient with nausea, vomiting, and diarrhea over the last 2 to 3 days.  Patient with noted outpatient hyperkalemia and AKI.  Repeat labs today demonstrate evidence of potassium of 5.9.  Creatinine today is 5.69 with a BUN of 74.  Patient's blood pressure initially was in the 80s over 50s.  This is improved with gentle fluid hydration.  Patient will benefit from admission for further work-up and treatment.  Additional history obtained:  Additional history obtained from Spouse External records from outside sources obtained and reviewed including prior ED visits and prior Inpatient records.    Lab Tests:  I ordered and personally interpreted labs.  The pertinent results include: CBC, CBG, i-STAT Chem-8, CMP,   Imaging Studies ordered:  I ordered imaging studies including last x-ray I independently visualized and interpreted obtained imaging which showed NAD I agree with the radiologist interpretation.   Cardiac Monitoring:  The patient was maintained on a cardiac  monitor.  I personally viewed and interpreted the cardiac monitor which showed an underlying rhythm of: NSR   Medicines ordered:  I ordered medication including insulin, dextrose, Lokelma, IV fluids, Zofran for dehydration, hyperkalemia, nausea Reevaluation of the patient after these medicines showed that the patient: improved  Problem List / ED Course:  Per kalemia, AKI, hypotension   Reevaluation:  After the interventions noted above, I reevaluated the patient and found that they have: improved   Disposition:  After consideration of the diagnostic results and the patients response to treatment, I feel that the patent would benefit from admission.    CRITICAL CARE Performed by: Wynetta Fines   Total critical care time: 30 minutes  Critical care time was exclusive of separately billable procedures and treating other patients.  Critical care was necessary to treat or prevent imminent or life-threatening deterioration.  Critical care was time spent personally by me on the following activities: development of treatment plan with patient and/or surrogate as well as nursing, discussions with consultants, evaluation of patient's response to treatment, examination of patient, obtaining history from patient or surrogate, ordering and performing treatments and interventions, ordering and review of laboratory studies, ordering and review of radiographic studies, pulse oximetry and re-evaluation of patient's condition.         Final Clinical Impression(s) / ED Diagnoses Final diagnoses:  Hyperkalemia  AKI (acute kidney injury) ([facility])  Hypotension, unspecified hypotension type    Rx / DC Orders ED Discharge Orders     None         Wynetta Fines, MD 11/21/21 1754

## 2021-11-22 ENCOUNTER — Inpatient Hospital Stay (HOSPITAL_COMMUNITY): Payer: Medicare Other

## 2021-11-22 DIAGNOSIS — I5020 Unspecified systolic (congestive) heart failure: Secondary | ICD-10-CM

## 2021-11-22 DIAGNOSIS — K219 Gastro-esophageal reflux disease without esophagitis: Secondary | ICD-10-CM | POA: Diagnosis not present

## 2021-11-22 DIAGNOSIS — N179 Acute kidney failure, unspecified: Secondary | ICD-10-CM | POA: Diagnosis not present

## 2021-11-22 DIAGNOSIS — N189 Chronic kidney disease, unspecified: Secondary | ICD-10-CM

## 2021-11-22 DIAGNOSIS — E669 Obesity, unspecified: Secondary | ICD-10-CM | POA: Diagnosis not present

## 2021-11-22 DIAGNOSIS — I6930 Unspecified sequelae of cerebral infarction: Secondary | ICD-10-CM

## 2021-11-22 LAB — BASIC METABOLIC PANEL
Anion gap: 12 (ref 5–15)
BUN: 72 mg/dL — ABNORMAL HIGH (ref 8–23)
CO2: 16 mmol/L — ABNORMAL LOW (ref 22–32)
Calcium: 8.6 mg/dL — ABNORMAL LOW (ref 8.9–10.3)
Chloride: 106 mmol/L (ref 98–111)
Creatinine, Ser: 4.29 mg/dL — ABNORMAL HIGH (ref 0.44–1.00)
GFR, Estimated: 10 mL/min — ABNORMAL LOW (ref >60)
Glucose, Bld: 106 mg/dL — ABNORMAL HIGH (ref 70–99)
Potassium: 4.7 mmol/L (ref 3.5–5.1)
Sodium: 134 mmol/L — ABNORMAL LOW (ref 135–145)

## 2021-11-22 MED ORDER — ALBUMIN HUMAN 25 % IV SOLN
12.5000 g | INTRAVENOUS | Status: AC
  Administered 2021-11-22: 12.5 g via INTRAVENOUS
  Filled 2021-11-22: qty 50

## 2021-11-22 MED ORDER — FAMOTIDINE 20 MG PO TABS
20.0000 mg | ORAL_TABLET | Freq: Every day | ORAL | Status: DC
  Administered 2021-11-22 – 2021-11-24 (×3): 20 mg via ORAL
  Filled 2021-11-22 (×3): qty 1

## 2021-11-22 MED ORDER — MIDODRINE HCL 5 MG PO TABS
10.0000 mg | ORAL_TABLET | ORAL | Status: AC
  Administered 2021-11-22: 10 mg via ORAL
  Filled 2021-11-22: qty 2

## 2021-11-22 MED ORDER — SODIUM CHLORIDE 0.9 % IV SOLN: INTRAVENOUS | Status: DC

## 2021-11-22 MED ORDER — SODIUM CHLORIDE 0.9 % IV BOLUS
500.0000 mL | Freq: Once | INTRAVENOUS | Status: AC
  Administered 2021-11-22: 500 mL via INTRAVENOUS

## 2021-11-22 NOTE — ED Notes (Signed)
Dr notified of patient BP.

## 2021-11-22 NOTE — Progress Notes (Signed)
Progress Note   Patient: [name] [id number] DOB: Feb 11, 1950 DOA: 11/21/2021     1 DOS: the patient was seen and examined on 11/22/2021   Brief hospital course: Mrs. Hainline was admitted to the hospital with the working diagnosis of renal failure.   71 yo female with the past medical history of heart failure, hypertension, CVA, dyslipidemia and obesity who presented with hyperkalemia. As outpatient her furosemide dose was increased for volume overload, along with increase K supplements, at home she had nausea and vomiting for 2 days, along with diarrhea. Follow up as outpatient she was found to have hyperkalemia and she was referred to the hospital for further evaluation. On her initial physical examination her blood pressure was 85/52, HR 84, RR 12, 02 saturation 100%, dry mucous membranes, lungs with no rales or wheezing, heart with S1 and S2 present and rhythmic, abdomen with no distention, no lower extremity edema.   Na 135, K 5,8 CL 106 bicarbonate 16, glucose 120 BUN 80 and cr 6,10  Urine analysis SG 1,008, negative protein, 0-5 wbc, 0-5 rbc   Chest radiograph with no cardiomegaly, pacemaker in place with one atrial lead and 2 ventricular leads, no infiltrates or effusions.   EKG 92 bpm, right axis, normal intervals, atrial sensed and ventricular paced, no significant ST segment changes, negative T wave lead III and AvF.   Patient was placed on IV fluids with improvement in renal function.   Assessment and Plan: * Acute kidney injury superimposed on chronic kidney disease (HCC) CKD stage 3 a, hyperkalemia, hyponatremia  Systolic blood pressure 90 to 100 mmHg.  Patient with hypovolemia, renal function today with serum cr at 4.29, with K at 4,7 and serum bicarbonate at 16. Plan to add 500 cc bolus now of NS and then continue with NS at 50 ml per hr Follow up renal function and electrolytes in am, avoid hypotension and nephrotoxic medications Hold on diuretics and K  supplementation.   Heart failure with reduced ejection fraction ([place])- s/p ICD and pacemaker  Patient with no clinical signs of heart failure decompensation, she had increased diuresis as outpatient Plan to continue close blood pressure monitoring For now hold cardiovascular medications that can lead to hypotension.   GERD (gastroesophageal reflux disease) Hold on proton pump inhibitor in the setting of AKI    History of CVA with residual deficit Continue neuro checks per unit protocol Continue with clopidogrel and atorvastatin.  PT and OT evaluation   Obesity (BMI 30-39.9) Calculated BMI is 31.5         Subjective: patient with no chest pain, or dyspnea, no longer nausea, abdominal pain or diarrhea, feeling very weak and deconditioned   Physical Exam: Vitals:   11/22/21 1000 11/22/21 1000 11/22/21 1045 11/22/21 1158  BP: (!) 90/49 (!) 93/59 (!) 97/51   Pulse: (!) 57 (!) 55 (!) 57   Resp: 15 16 14    Temp:    97.6 F (36.4 C)  TempSrc:    Oral  SpO2: 98% 97% 99%   Weight:      Height:       Neurology awake and alert ENT with mild pallor, oral mucosa dry Cardiovascular with S1 and S2 present and rhythmic with no gallops, rubs or murmurs Respiratory with no rales or wheezing Abdomen with no distention  No lower extremity edema  Data Reviewed:    Family Communication: no family at the bedside   Disposition: Status is: Inpatient Remains inpatient appropriate because: renal failure and  hyperkalemia   Planned Discharge Destination: Home     Author: Tawni Millers, MD 11/22/2021 1:01 PM  For on call review [email or web address].

## 2021-11-22 NOTE — ED Notes (Signed)
Pt BP is 50'N to 39'J systolic and c/o dixzziness with movement when  pulled up in bed. Dr. Cathlean Sauer aware.

## 2021-11-22 NOTE — ED Notes (Signed)
Called pharmacy for pt VIMPAT at this time.

## 2021-11-22 NOTE — ED Notes (Signed)
Dr. Nevada Crane notified of pt's trending BP. Awaiting further orders.

## 2021-11-22 NOTE — ED Notes (Signed)
Pt resting in bed comfortably at this time. No needs or concerns voiced.

## 2021-11-22 NOTE — Progress Notes (Signed)
Heart Failure Navigator Progress Note  Assessed for Heart & Vascular TOC clinic readiness.  Patient does not meet criteria due to Advanced Heart Failure Team, Dr. McLean.     Delmont Prosch, BSN, RN Heart Failure Nurse Navigator Secure Chat Only   

## 2021-11-22 NOTE — Assessment & Plan Note (Addendum)
CKD stage 3 a, hyperkalemia, hyponatremia  Systolic blood pressure 90 to 100 mmHg.  Patient with hypovolemia, renal function today with serum cr at 4.29, with K at 4,7 and serum bicarbonate at 16. Plan to add 500 cc bolus now of NS and then continue with NS at 50 ml per hr Follow up renal function and electrolytes in am, avoid hypotension and nephrotoxic medications Hold on diuretics and K supplementation.

## 2021-11-22 NOTE — ED Notes (Signed)
Patient given warm blanket. Family at bedside. 

## 2021-11-22 NOTE — Assessment & Plan Note (Signed)
Calculated BMI is 31.5 ? ?

## 2021-11-22 NOTE — Hospital Course (Signed)
[name] was admitted to the hospital with the working diagnosis of renal failure.   71 yo female with the past medical history of heart failure, hypertension, CVA, dyslipidemia and obesity who presented with hyperkalemia. As outpatient her furosemide dose was increased for volume overload, along with increase K supplements, at home she had nausea and vomiting for 2 days, along with diarrhea. Follow up as outpatient she was found to have hyperkalemia and she was referred to the hospital for further evaluation. On her initial physical examination her blood pressure was 85/52, HR 84, RR 12, 02 saturation 100%, dry mucous membranes, lungs with no rales or wheezing, heart with S1 and S2 present and rhythmic, abdomen with no distention, no lower extremity edema.   Na 135, K 5,8 CL 106 bicarbonate 16, glucose 120 BUN 80 and cr 6,10  Urine analysis SG 1,008, negative protein, 0-5 wbc, 0-5 rbc   Chest radiograph with no cardiomegaly, pacemaker in place with one atrial lead and 2 ventricular leads, no infiltrates or effusions.   EKG 92 bpm, right axis, normal intervals, atrial sensed and ventricular paced, no significant ST segment changes, negative T wave lead III and AvF.   Patient was placed on IV fluids with improvement in renal function.

## 2021-11-22 NOTE — ED Notes (Signed)
Pt sleeping in bed at this time.

## 2021-11-22 NOTE — Assessment & Plan Note (Addendum)
Continue neuro checks per unit protocol Continue with clopidogrel and atorvastatin.  PT and OT evaluation

## 2021-11-22 NOTE — Assessment & Plan Note (Signed)
Hold on proton pump inhibitor in the setting of AKI

## 2021-11-22 NOTE — Assessment & Plan Note (Signed)
Patient with no clinical signs of heart failure decompensation, she had increased diuresis as outpatient Plan to continue close blood pressure monitoring For now hold cardiovascular medications that can lead to hypotension.

## 2021-11-23 DIAGNOSIS — I959 Hypotension, unspecified: Secondary | ICD-10-CM

## 2021-11-23 LAB — BASIC METABOLIC PANEL
Anion gap: 10 (ref 5–15)
BUN: 54 mg/dL — ABNORMAL HIGH (ref 8–23)
CO2: 19 mmol/L — ABNORMAL LOW (ref 22–32)
Calcium: 8.9 mg/dL (ref 8.9–10.3)
Chloride: 109 mmol/L (ref 98–111)
Creatinine, Ser: 2.71 mg/dL — ABNORMAL HIGH (ref 0.44–1.00)
GFR, Estimated: 18 mL/min — ABNORMAL LOW (ref >60)
Glucose, Bld: 99 mg/dL (ref 70–99)
Potassium: 4.2 mmol/L (ref 3.5–5.1)
Sodium: 138 mmol/L (ref 135–145)

## 2021-11-23 LAB — CBC
HCT: 30.9 % — ABNORMAL LOW (ref 36.0–46.0)
Hemoglobin: 10 g/dL — ABNORMAL LOW (ref 12.0–15.0)
MCH: 29.5 pg (ref 26.0–34.0)
MCHC: 32.4 g/dL (ref 30.0–36.0)
MCV: 91.2 fL (ref 80.0–100.0)
Platelets: 233 10*3/uL (ref 150–400)
RBC: 3.39 MIL/uL — ABNORMAL LOW (ref 3.87–5.11)
RDW: 14.4 % (ref 11.5–15.5)
WBC: 9.7 10*3/uL (ref 4.0–10.5)
nRBC: 0 % (ref 0.0–0.2)

## 2021-11-23 LAB — MAGNESIUM: Magnesium: 2.3 mg/dL (ref 1.7–2.4)

## 2021-11-23 NOTE — Progress Notes (Signed)
[facility]  PROGRESS NOTE  [name] [id number]:[id number] DOB: [birth year] DOA: 11/21/2021 PCP: Storm Frisk, MD   Brief HPI:   71 year old female with past medical history of HFrEF, hypertension, CVA, dyslipidemia, obesity presented with hyperkalemia.  Patient is dose of furosemide was increased due to volume overload with increased potassium supplements.  She started having nausea vomiting for 2 days along with diarrhea.  As outpatient was found to have hyperkalemia and was referred to hospital for further evaluation. Patient was found to have potassium of 5.8.    Subjective   Patient seen and examined, denies any complaints.   Assessment/Plan:     Acute kidney injury superimposed on CKD stage III a -Patient baseline creatinine was 1.28 on 11/09/2021 -Creatinine today improved to 2.71; diuretics on hold -Continue normal saline infusion at 50 mL/h -Follow renal function in a.m.  HFrEF s/p ICD/pacemaker -No clinical signs symptoms of decompensation -Started on gentle IV fluids as above -Diuretics on hold  GERD -PPI on hold in setting of AKI -Started on famotidine  History of CVA with residual deficit -Continue Plavix, atorvastatin -PT/OT consulted  Obesity -BMI 31.5    Medications     atorvastatin  80 mg Oral q1800   clopidogrel  75 mg Oral Daily   famotidine  20 mg Oral Daily   fluticasone  2 spray Each Nare Daily   heparin  5,000 Units Subcutaneous Q12H   loratadine  5 mg Oral Daily   melatonin  3 mg Oral QHS   sodium chloride flush  3 mL Intravenous Q12H     Data Reviewed:   CBG:  Recent Labs  Lab 11/21/21 1630  GLUCAP 112*    SpO2: 96 %    Vitals:   11/23/21 0333 11/23/21 0415 11/23/21 0430 11/23/21 0545  BP:  (!) 94/55 (!) 100/48 (!) 95/44  Pulse:  (!) 58 61 69  Resp:    18  Temp: 97.8 F (36.6 C)     TempSrc: Oral     SpO2:  99% 99% 96%  Weight:      Height:          Data Reviewed:  Basic Metabolic Panel: Recent  Labs  Lab 11/21/21 0841 11/21/21 1625 11/21/21 1701 11/21/21 2247 11/22/21 0422 11/23/21 0330  NA 133* 135 135  --  134* 138  K 7.4* 5.9* 5.8* 4.9 4.7 4.2  CL 105 105 106  --  106 109  CO2 16* 17*  --   --  16* 19*  GLUCOSE 101* 125* 120*  --  106* 99  BUN 68* 74* 80*  --  72* 54*  CREATININE 5.32* 5.69* 6.10*  --  4.29* 2.71*  CALCIUM 9.2 9.5  --   --  8.6* 8.9  MG  --   --   --   --   --  2.3    CBC: Recent Labs  Lab 11/21/21 1625 11/21/21 1701 11/23/21 0330  WBC 12.7*  --  9.7  HGB 12.2 11.9* 10.0*  HCT 37.4 35.0* 30.9*  MCV 90.3  --  91.2  PLT 346  --  233    LFT Recent Labs  Lab 11/21/21 1625  AST 15  ALT 13  ALKPHOS 113  BILITOT 1.3*  PROT 8.1  ALBUMIN 4.0     Antibiotics: Anti-infectives (From admission, onward)    None        DVT prophylaxis: Heparin  Code Status: Full code  Family Communication: No family at bedside  CONSULTS    Objective    Physical Examination:   General-appears in no acute distress Heart-S1-S2, regular, no murmur auscultated Lungs-clear to auscultation bilaterally, no wheezing or crackles auscultated Abdomen-soft, nontender, no organomegaly Extremities-no edema in the lower extremities Neuro-alert, oriented x3, no focal deficit noted   Status is: Inpatient:             Oswald Hillock   [facility] If 7PM-7AM, please contact night-coverage at [email or web address], Office  [phone]   11/23/2021, 8:43 AM  LOS: 2 days

## 2021-11-24 DIAGNOSIS — E875 Hyperkalemia: Secondary | ICD-10-CM

## 2021-11-24 LAB — CBC
HCT: 32.4 % — ABNORMAL LOW (ref 36.0–46.0)
Hemoglobin: 10.5 g/dL — ABNORMAL LOW (ref 12.0–15.0)
MCH: 29.3 pg (ref 26.0–34.0)
MCHC: 32.4 g/dL (ref 30.0–36.0)
MCV: 90.5 fL (ref 80.0–100.0)
Platelets: 224 10*3/uL (ref 150–400)
RBC: 3.58 MIL/uL — ABNORMAL LOW (ref 3.87–5.11)
RDW: 14 % (ref 11.5–15.5)
WBC: 14.7 10*3/uL — ABNORMAL HIGH (ref 4.0–10.5)
nRBC: 0 % (ref 0.0–0.2)

## 2021-11-24 LAB — BASIC METABOLIC PANEL
Anion gap: 8 (ref 5–15)
BUN: 33 mg/dL — ABNORMAL HIGH (ref 8–23)
CO2: 19 mmol/L — ABNORMAL LOW (ref 22–32)
Calcium: 8.9 mg/dL (ref 8.9–10.3)
Chloride: 112 mmol/L — ABNORMAL HIGH (ref 98–111)
Creatinine, Ser: 1.8 mg/dL — ABNORMAL HIGH (ref 0.44–1.00)
GFR, Estimated: 30 mL/min — ABNORMAL LOW (ref >60)
Glucose, Bld: 102 mg/dL — ABNORMAL HIGH (ref 70–99)
Potassium: 4.4 mmol/L (ref 3.5–5.1)
Sodium: 139 mmol/L (ref 135–145)

## 2021-11-24 NOTE — TOC Initial Note (Signed)
Transition of Care [facility]) - Initial/Assessment Note    Patient Details  Name: [name] MRN: [id number] Date of Birth: 07-05-1950  [facility] CM/SW Contact:    Erenest Rasher, RN Phone Number: [phone] 11/24/2021, 4:15 PM  Clinical Narrative:                 HF TOC CM spoke to pt and husband at bedside. Pt states she has a cane and scale at home. Requesting a Rollator and 3n1 bedside commode. [place]. Pt requesting DME be shipped to home. Updated [employer] to ship to home.   Expected Discharge Plan: Home/Self Care Barriers to Discharge: No Barriers Identified   Patient Goals and CMS Choice Patient states their goals for this hospitalization and ongoing recovery are:: wants to remain independent      Expected Discharge Plan and Services Expected Discharge Plan: Home/Self Care   Discharge Planning Services: CM Consult     Expected Discharge Date: 11/24/21                                    Prior Living Arrangements/Services     Patient language and need for interpreter reviewed:: Yes Do you feel safe going back to the place where you live?: Yes      Need for Family Participation in Patient Care: No (Comment) Care giver support system in place?: Yes (comment)   Criminal Activity/Legal Involvement Pertinent to Current Situation/Hospitalization: No - Comment as needed  Activities of Daily Living Home Assistive Devices/Equipment: Cane (specify quad or straight) ADL Screening (condition at time of admission) Is the patient deaf or have difficulty hearing?: No Does the patient have difficulty seeing, even when wearing glasses/contacts?: Yes Does the patient have difficulty concentrating, remembering, or making decisions?: No Does the patient have difficulty dressing or bathing?: Yes Does the patient have difficulty walking or climbing stairs?: Yes  Permission Sought/Granted Permission sought to share information with : Case  Manager, Family Supports, PCP Permission granted to share information with : Yes, Verbal Permission Granted  Share Information with NAME: Jozey Schooler     Permission granted to share info w Relationship: husband  Permission granted to share info w Contact Information: Mariel Craft  Emotional Assessment Appearance:: Appears stated age Attitude/Demeanor/Rapport: Engaged Affect (typically observed): Accepting Orientation: : Oriented to Self, Oriented to Place, Oriented to  Time, Oriented to Situation   Psych Involvement: No (comment)  Admission diagnosis:  Hyperkalemia [E87.5] AKI (acute kidney injury) ([place]) [N17.9] Hypotension, unspecified hypotension type [I95.9] Acute kidney injury superimposed on chronic kidney disease ([place]) [N17.9, N18.9] Patient Active Problem List   Diagnosis Date Noted   Obesity (BMI 30-39.9) 11/22/2021   Acute kidney injury superimposed on chronic kidney disease ([place]) 11/21/2021   Hyperkalemia 11/21/2021   History of CVA with residual deficit 02/22/2021   Weight loss 02/22/2021   Stage 3a chronic kidney disease ([place]) 02/22/2021   Heart failure with reduced ejection fraction ([place])- s/p ICD and pacemaker  10/19/2017   Allergic rhinitis 11/09/2014   Cerebral infarction due to embolism of cerebral artery ([place]) 12/03/2013   GERD (gastroesophageal reflux disease)    Non-ischemic cardiomyopathy ([place]) 08/11/2013   Mitral regurgitation    Healthcare maintenance 04/09/2013   PCP:  [name], MD Pharmacy:   [facility]- [place], [place] - [address] [place] [address] Phone:  [phone] Fax: [phone]  [facility] - [place], [place] - [address] [place] [address] Phone: [phone] Fax: [phone]     Social Determinants of Health (SDOH) Interventions Housing Interventions: Intervention Not Indicated Transportation Interventions: Intervention  Not Indicated Utilities Interventions: Intervention Not Indicated  Readmission Risk Interventions     No data to display

## 2021-11-24 NOTE — Consult Note (Signed)
Advanced Heart Failure Team Consult Note   Primary Physician: [name], MD PCP-Cardiologist:  Dr. Aundra Dubin   Reason for Consultation: Medication Management for Systolic Heart Failure in Setting of Recent AKI and Hyperkalemia   HPI:    [name] is seen today for evaluation of medication management for systolic heart heart failure in the setting of recent AKI and hyperkalemia, at the request of Dr. Darrick Meigs, Internal Medicine.   HF with nonischemic cardiomypathy (no significant CAD) s/p Medtronic CRT-D, LBBB, mild-mod MR and CVA (09/2013).   Coronary angiography in 7/15 showed no significant CAD.  Admitted in 9/15 with right sided facial droop and expressive aphasia, CVA on MRI. Had TEE showing EF 15%.     QRS narrowed, so in 11/16 device was reprogrammed to VVI.    Echo in 12/17 showed EF up to 45-50%.  Echo in 3/19 showed EF 45-50% with moderate LVH and normal RV.    Echo was done in 4/21 showing EF decreased to 30-35% with LAD territory wall motion abnormalities. LHC/RHC in 5/21 showed normal filling pressures and cardiac output, no significant CAD.  With persistent LBBB, CRT was activated by Dr. Caryl Comes in 9/21.    Echo 3/23 showed EF 35-40%, diffuse hypokinesis, mildly decreased RV systolic function.   Seen recently in [place] on 10/25 for routine f/u and was noted to be volume overloaded, wt up 9 lb (184 lb), and decreased thoracic impedence on device interrogation. Labs showed SCr 1.28 and K 3.1. She was instructed to increase Lasix to 60 qam/40 qpm x 1 day then 40 mg bid after that. She was also instructed to restart  KCl 40 daily (had not been taking). Continued on Entresto 49-51, Spiro 25 and Farxiga 10. She was instructed to return for f/u BMP in 1 wk.    Repeat BMP 11/6 showed AKI and hyperkalemia, SCr bumped from 1.28>>5.32. BUN 74.  K 3.1>>7.4. Pt notified of critical labs results and sent to ED. Pt had reported 2 day h/o nausea/vomiting and diarrhea after eating bad  pizza. She was hypotensive in ED, BP 87/53. Admitted by IM. Diuretics/ BP active meds held. Hydrated w/ IVFs. Insulin, dextrose, Lokelma given for hyperkalemia.   Now improved. SCr down to 1.80 today. K 4.4. BPs remain soft, upper 0000000 systolic. Plan is for d/c back home soon. AHF team asked to assist w/ guidance restarting GDMT/diuretics.   Feels better today. N/v/d resolved. Appetite is good. No dyspnea.   Review of Systems: [y] = yes, [ ]  = no   General: Weight gain [ ] ; Weight loss [ ] ; Anorexia [ ] ; Fatigue [ ] ; Fever [ ] ; Chills [ ] ; Weakness [ ]   Cardiac: Chest pain/pressure [ ] ; Resting SOB [ ] ; Exertional SOB [ ] ; Orthopnea [ ] ; Pedal Edema [ ] ; Palpitations [ ] ; Syncope [ ] ; Presyncope [ ] ; Paroxysmal nocturnal dyspnea[ ]   Pulmonary: Cough [ ] ; Wheezing[ ] ; Hemoptysis[ ] ; Sputum [ ] ; Snoring [ ]   GI: Vomiting[ ] ; Dysphagia[ ] ; Melena[ ] ; Hematochezia [ ] ; Heartburn[ ] ; Abdominal pain [ ] ; Constipation [ ] ; Diarrhea [ ] ; BRBPR [ ]   GU: Hematuria[ ] ; Dysuria [ ] ; Nocturia[ ]   Vascular: Pain in legs with walking [ ] ; Pain in feet with lying flat [ ] ; Non-healing sores [ ] ; Stroke [ ] ; TIA [ ] ; Slurred speech [ ] ;  Neuro: Headaches[ ] ; Vertigo[ ] ; Seizures[ ] ; Paresthesias[ ] ;Blurred vision [ ] ; Diplopia [ ] ; Vision changes [ ]   Ortho/Skin: Arthritis [ ] ;  Joint pain [ ] ; Muscle pain [ ] ; Joint swelling [ ] ; Back Pain [ ] ; Rash [ ]   Psych: Depression[ ] ; Anxiety[ ]   Heme: Bleeding problems [ ] ; Clotting disorders [ ] ; Anemia [ ]   Endocrine: Diabetes [ ] ; Thyroid dysfunction[ ]   Home Medications Prior to Admission medications   Medication Sig Start Date End Date Taking? Authorizing Provider  albuterol (VENTOLIN HFA) 108 (90 Base) MCG/ACT inhaler Inhale 2 puffs into the lungs at bedtime as needed for wheezing or shortness of breath. 10/19/21  Yes [name], MD  atorvastatin (LIPITOR) 80 MG tablet Take 1 tablet (80 mg total) by mouth daily at 6 PM. 10/19/21  Yes [name], MD   carvedilol (COREG) 6.25 MG tablet Take 1 tablet (6.25 mg total) by mouth 2 (two) times daily with a meal. 10/19/21  Yes [name], MD  cetirizine (ZYRTEC) 10 MG tablet Take 1 tablet (10 mg total) by mouth daily. 10/19/21  Yes [name], MD  clopidogrel (PLAVIX) 75 MG tablet Take 1 tablet (75 mg total) by mouth daily. 10/19/21  Yes [name], MD  dapagliflozin propanediol (FARXIGA) 10 MG TABS tablet Take 1 tablet (10 mg total) by mouth daily. 10/19/21  Yes [name], MD  fluticasone (FLONASE) 50 MCG/ACT nasal spray Place 2 sprays into both nostrils daily. 10/19/21  Yes [name], MD  furosemide (LASIX) 40 MG tablet Take 1.5 tablets (60 mg total) by mouth 2 (two) times daily. Patient taking differently: Take 40-60 mg by mouth See admin instructions. Take 60 mg by mouth every morning, and 40 mg every evening. 11/14/21  Yes Larey Dresser, MD  pantoprazole (PROTONIX) 40 MG tablet Take 1 tablet (40 mg total) by mouth daily. 10/19/21  Yes [name], MD  potassium chloride (KLOR-CON M) 10 MEQ tablet Take 6 tablets (60 mEq total) by mouth daily. Patient taking differently: Take 60 mEq by mouth daily. 30 mEq in the morning and 30 mEq in the evening. 11/14/21  Yes Larey Dresser, MD  sacubitril-valsartan (ENTRESTO) 49-51 MG Take 1 tablet by mouth 2 (two) times daily. 10/19/21  Yes [name], MD  spironolactone (ALDACTONE) 25 MG tablet Take 1 tablet (25 mg total) by mouth daily. 10/19/21 01/17/22 Yes [name], MD  TERCONAZOLE VA Place 1 Application vaginally as needed (itching).   Yes [provider]    Past Medical History: Past Medical History:  Diagnosis Date   A-fib ([place])    Arthritis    Automatic implantable cardioverter-defibrillator in situ    Cardiac resynchronization therapy defibrillator (CRT-D) in place    a. placed on 11/05/13.   GERD (gastroesophageal reflux disease)    HLD (hyperlipidemia)    Hypotension    LBBB (left  bundle branch block)    Mitral regurgitation    a. Mild-mod MR by echo 07/2013.   PONV (postoperative nausea and vomiting)    Stroke ([place]) 0000000   Systolic CHF ([place])    a. Dx 07/2013 - due to NICM. Cath normal cors. EF 25%.    Past Surgical History: Past Surgical History:  Procedure Laterality Date   BI-VENTRICULAR IMPLANTABLE CARDIOVERTER DEFIBRILLATOR N/A 11/05/2013   Procedure: BI-VENTRICULAR IMPLANTABLE CARDIOVERTER DEFIBRILLATOR  (CRT-D);  Surgeon: Deboraha Sprang, MD;  Location: [facility] CATH LAB;  Service: Cardiovascular;  Laterality: N/A;   BI-VENTRICULAR IMPLANTABLE CARDIOVERTER DEFIBRILLATOR  (CRT-D)  11/05/2013   CARDIAC CATHETERIZATION  07/2013   KNEE ARTHROSCOPY Right    LEFT  AND RIGHT HEART CATHETERIZATION WITH CORONARY ANGIOGRAM N/A 08/08/2013   Procedure: LEFT AND RIGHT HEART CATHETERIZATION WITH CORONARY ANGIOGRAM;  Surgeon: Burnell Blanks, MD;  Location[facility] CATH LAB;  Service: Cardiovascular;  Laterality: N/A;   RIGHT/LEFT HEART CATH AND CORONARY ANGIOGRAPHY N/A 06/05/2019   Procedure: RIGHT/LEFT HEART CATH AND CORONARY ANGIOGRAPHY;  Surgeon: Larey Dresser, MD;  Location: [facility] CV LAB;  Service: Cardiovascular;  Laterality: N/A;   TEE WITHOUT CARDIOVERSION N/A 09/26/2013   Procedure: TRANSESOPHAGEAL ECHOCARDIOGRAM (TEE);  Surgeon: Larey Dresser, MD;  Location: [facility] ENDOSCOPY;  Service: Cardiovascular;  Laterality: N/A;   TONSILLECTOMY     WRIST ARTHROSCOPY  12/21/2010   Procedure: ARTHROSCOPY WRIST;  Surgeon: Wynonia Sours, MD;  Location: [facility];  Service: Orthopedics;  Laterality: Right;  right wrist, repair TFCC on right, open reconstruction ECU sheath    Family History: Family History  Problem Relation Age of Onset   Congestive Heart Failure Mother    Cancer Mother 56       Breast   Cancer Father 12       Oat Cell CA   Lupus Sister    Heart disease Brother     Social History: Social History   Socioeconomic History   Marital  status: Married    Spouse name: Not on file   Number of children: 1   Years of education: 12   Highest education level: Not on file  Occupational History   Not on file  Tobacco Use   Smoking status: Never   Smokeless tobacco: Never  Vaping Use   Vaping Use: Never used  Substance and Sexual Activity   Alcohol use: No    Alcohol/week: 0.0 standard drinks of alcohol   Drug use: No   Sexual activity: Yes  Other Topics Concern   Not on file  Social History Narrative   Patient is married with one child.   Patient is right handed.   Patient has hs education and 3 yrs of college.   Patient drinks 1 can daily.   Social Determinants of Health   Financial Resource Strain: Not on file  Food Insecurity: No Food Insecurity (11/23/2021)   Hunger Vital Sign    Worried About Running Out of Food in the Last Year: Never true    Ran Out of Food in the Last Year: Never true  Transportation Needs: Not on file  Physical Activity: Not on file  Stress: Not on file  Social Connections: Not on file    Allergies:  Allergies  Allergen Reactions   Voltaren [Diclofenac] Rash    Objective:    Vital Signs:   Temp:  [97.9 F (36.6 C)-98.5 F (36.9 C)] 98 F (36.7 C) (11/09 0800) Pulse Rate:  [66-97] 97 (11/09 0800) Resp:  [15-20] 20 (11/09 0800) BP: (89-107)/(47-67) 107/67 (11/09 0800) SpO2:  [98 %] 98 % (11/08 2300) Weight:  [83.3 kg] 83.3 kg (11/09 0500) Last BM Date : 11/20/21  Weight change: Filed Weights   11/21/21 1544 11/24/21 0500  Weight: 83.5 kg 83.3 kg    Intake/Output:   Intake/Output Summary (Last 24 hours) at 11/24/2021 1007 Last data filed at 11/24/2021 0500 Gross per 24 hour  Intake 94.12 ml  Output 1500 ml  Net -1405.88 ml      Physical Exam    General:  Well appearing. No resp difficulty HEENT: normal Neck: supple. JVP not elevated. Carotids 2+ bilat; no bruits. No lymphadenopathy or thyromegaly appreciated. Cor: PMI nondisplaced.  Regular rate & rhythm. No  rubs, gallops or murmurs. Lungs: clear Abdomen: soft, nontender, nondistended. No hepatosplenomegaly. No bruits or masses. Good bowel sounds. Extremities: no cyanosis, clubbing, rash, edema Neuro: alert & orientedx3, cranial nerves grossly intact. moves all 4 extremities w/o difficulty. Affect pleasant   Telemetry   Vpaced 80s   EKG    No new EKG to review   Labs   Basic Metabolic Panel: Recent Labs  Lab 11/21/21 0841 11/21/21 1625 11/21/21 1701 11/21/21 2247 11/22/21 0422 11/23/21 0330 11/24/21 0408  NA 133* 135 135  --  134* 138 139  K 7.4* 5.9* 5.8* 4.9 4.7 4.2 4.4  CL 105 105 106  --  106 109 112*  CO2 16* 17*  --   --  16* 19* 19*  GLUCOSE 101* 125* 120*  --  106* 99 102*  BUN 68* 74* 80*  --  72* 54* 33*  CREATININE 5.32* 5.69* 6.10*  --  4.29* 2.71* 1.80*  CALCIUM 9.2 9.5  --   --  8.6* 8.9 8.9  MG  --   --   --   --   --  2.3  --     Liver Function Tests: Recent Labs  Lab 11/21/21 1625  AST 15  ALT 13  ALKPHOS 113  BILITOT 1.3*  PROT 8.1  ALBUMIN 4.0   No results for input(s): "LIPASE", "AMYLASE" in the last 168 hours. No results for input(s): "AMMONIA" in the last 168 hours.  CBC: Recent Labs  Lab 11/21/21 1625 11/21/21 1701 11/23/21 0330 11/24/21 0408  WBC 12.7*  --  9.7 14.7*  HGB 12.2 11.9* 10.0* 10.5*  HCT 37.4 35.0* 30.9* 32.4*  MCV 90.3  --  91.2 90.5  PLT 346  --  233 224    Cardiac Enzymes: No results for input(s): "CKTOTAL", "CKMB", "CKMBINDEX", "TROPONINI" in the last 168 hours.  BNP: BNP (last 3 results) Recent Labs    01/11/21 1040 11/09/21 1031  BNP 22.1 75.4    ProBNP (last 3 results) No results for input(s): "PROBNP" in the last 8760 hours.   CBG: Recent Labs  Lab 11/21/21 1630  GLUCAP 112*    Coagulation Studies: No results for input(s): "LABPROT", "INR" in the last 72 hours.   Imaging   No results found.   Medications:     Current Medications:  atorvastatin  80 mg Oral q1800   clopidogrel   75 mg Oral Daily   famotidine  20 mg Oral Daily   fluticasone  2 spray Each Nare Daily   heparin  5,000 Units Subcutaneous Q12H   loratadine  5 mg Oral Daily   melatonin  3 mg Oral QHS   sodium chloride flush  3 mL Intravenous Q12H    Infusions:  sodium chloride       Assessment/Plan   1. AKI - secondary to dehydration from GI loses/ hypotension and recent increase in diuretic regimen - SCr > 5 on admit. B/L SCr ~1.3 - improved w/ IVFs, SCr down to 1.80 today. SBPs upper 90s-low 100s. Euvolemic on exam  - d/w MD. Continue to hold diuretics at discharge - will plan f/u BMP in our clinic early next week   2. Hyperkalemia - K 7 on admit, secondary to AKI  - treated w/ insulin, Lokelma and calcium gluconate - resolved, K 4.4 today. AKI improving  - continue to hold spiro and Entresto at discharge - KCl supp discontinued  - repeat outpatient BMP next week  3. Chronic Systolic Heart Failure  - NICM, Last Echo 3/23 showed EF 35-40%, diffuse hypokinesis, mildly decreased RV systolic function - Euvolemic on exam. AKI improving - plan slow restart of GDMT. Given BPs remain soft + euvolemia, recommending holding all diuretics at discharge.  - ok to go home today. Will plan f/u BMP early next wk + provider f/u to reassess labs/ BP and volume status. Will add back meds as tolerated - husband will monitor BP closely and instructed to restart Coreg 6.25 mg bid if SBPs > 110 tomorrow.   4. Gastroenteritis - resolved   Discussed above w/ Dr. Gasper Lloyd, who has also seen and examined patient. Stable for d/c home today from HF standpoint. F/u next week in [facility] for provider f/u and BMP. We will place appt info in AVS.   Cardiac Meds for Discharge  Atorvastatin 80 mg daily  Plavix 75 mg daily  Can restart Coreg 6.25 mg bid on 11/10 if home SBP > 110  Length of Stay: [address], PA-C  11/24/2021, 10:07 AM  Advanced Heart Failure Team Pager [phone] (M-F; 7a - 5p)  Please  contact [facility] for night-coverage after hours (4p -7a ) and weekends on [email or web address]

## 2021-11-24 NOTE — Evaluation (Signed)
Physical Therapy Evaluation Patient Details Name: [name] MRN: [id number] DOB: 09/04/1950 Today's Date: 11/24/2021  History of Present Illness  71 year old female presented 11/21/21 with hyperkalemia and AKI. PMH HFrEF, hypertension, CVA, dyslipidemia, obesity  Clinical Impression   Patient evaluated by Physical Therapy with no further acute PT needs identified. All education has been completed and the patient has no further questions. Patient able to safely ambulate with RW 85 ft. She prefers to have rollator for home/community use. Educated on safety issues with rollator and husband reports he has used one and thinks she will be fine. Offered HHPT for additional education and pt refused.  See below for any follow-up Physical Therapy or equipment needs. PT is signing off. Thank you for this referral.        Recommendations for follow up therapy are one component of a multi-disciplinary discharge planning process, led by the attending physician.  Recommendations may be updated based on patient status, additional functional criteria and insurance authorization.  Follow Up Recommendations No PT follow up (discussed HHPT due to new device prescribed, however pt/husband refusing)      Assistance Recommended at Discharge Intermittent Supervision/Assistance  Patient can return home with the following  A little help with walking and/or transfers;Assistance with cooking/housework;Assist for transportation;Help with stairs or ramp for entrance    Equipment Recommendations Rollator (4 wheels)  Recommendations for Other Services       Functional Status Assessment Patient has had a recent decline in their functional status and demonstrates the ability to make significant improvements in function in a reasonable and predictable amount of time.     Precautions / Restrictions Precautions Precautions: Fall      Mobility  Bed Mobility Overal bed mobility: Modified Independent              General bed mobility comments: HOb elevated, no assist    Transfers Overall transfer level: Needs assistance Equipment used: Rolling walker (2 wheels), None Transfers: Sit to/from Stand Sit to Stand: Min assist           General transfer comment: from EOB and from standard toilet with grab bar (stating her knees are stiff)    Ambulation/Gait Ambulation/Gait assistance: Min guard Gait Distance (Feet): 15 Feet (no device; 85 with RW) Assistive device: Rolling walker (2 wheels), None Gait Pattern/deviations: Step-through pattern, Decreased stride length   Gait velocity interpretation: 1.31 - 2.62 ft/sec, indicative of limited community ambulator   General Gait Details: initially with HHA to simulate cane; pt requested use of RW and did well with it  Careers information officer    Modified Rankin (Stroke Patients Only)       Balance Overall balance assessment: Mild deficits observed, not formally tested                                           Pertinent Vitals/Pain Pain Assessment Pain Assessment: No/denies pain    Home Living Family/patient expects to be discharged to:: Private residence Living Arrangements: Spouse/significant other;Children Available Help at Discharge: Family;Available 24 hours/day Type of Home: Apartment Home Access: Stairs to enter   Entrance Stairs-Number of Steps: 1   Home Layout: One level Home Equipment: Grab bars - tub/shower;Cane - single Librarian, academic (2 wheels)      Prior Function Prior Level of Function : Needs assist  Mobility Comments: no device inside home; uses cane when goes out ADLs Comments: husband does all cooking and driving; assists with shower transfer     Hand Dominance   Dominant Hand: Right    Extremity/Trunk Assessment   Upper Extremity Assessment Upper Extremity Assessment: Overall WFL for tasks assessed    Lower Extremity  Assessment Lower Extremity Assessment: Overall WFL for tasks assessed    Cervical / Trunk Assessment Cervical / Trunk Assessment: Normal  Communication   Communication: No difficulties  Cognition Arousal/Alertness: Awake/alert Behavior During Therapy: WFL for tasks assessed/performed Overall Cognitive Status: Within Functional Limits for tasks assessed                                          General Comments General comments (skin integrity, edema, etc.): Husband present and agrees pt moving well enough to be at home.    Exercises     Assessment/Plan    PT Assessment Patient does not need any further PT services (pt refusing HHPT)  PT Problem List         PT Treatment Interventions      PT Goals (Current goals can be found in the Care Plan section)  Acute Rehab PT Goals Patient Stated Goal: to have a rollator in case she needs to sit down and rest somewhere PT Goal Formulation: All assessment and education complete, DC therapy    Frequency       Co-evaluation               AM-PAC PT "6 Clicks" Mobility  Outcome Measure Help needed turning from your back to your side while in a flat bed without using bedrails?: None Help needed moving from lying on your back to sitting on the side of a flat bed without using bedrails?: None Help needed moving to and from a bed to a chair (including a wheelchair)?: A Little Help needed standing up from a chair using your arms (e.g., wheelchair or bedside chair)?: A Little Help needed to walk in hospital room?: A Little Help needed climbing 3-5 steps with a railing? : A Little 6 Click Score: 20    End of Session Equipment Utilized During Treatment: Gait belt Activity Tolerance: Patient tolerated treatment well Patient left: in bed;with call bell/phone within reach;with family/visitor present Nurse Communication: Mobility status;Other (comment) (needs Rollator) PT Visit Diagnosis: Difficulty in walking, not  elsewhere classified (R26.2)    Time: 1350-1415 PT Time Calculation (min) (ACUTE ONLY): 25 min   Charges:   PT Evaluation $PT Eval Low Complexity: 1 Low PT Treatments $Gait Training: 8-22 mins         Arby Barrette, PT Acute Rehabilitation Services  Office [phone]   Rexanne Mano 11/24/2021, 2:27 PM

## 2021-11-24 NOTE — Progress Notes (Signed)
OT Cancellation Note  Patient Details Name: [name] MRN: [id number] DOB: [birth year]   Cancelled Treatment:    Reason Eval/Treat Not Completed: Other (comment).  PT advised patient is at baseline, spouse assists as needed, and she is ready to d/c home.  OT deferred eval.    [name] 11/24/2021, 2:36 PM 11/24/2021  RP, OTR/L  Acute Rehabilitation Services  Office:  [phone]

## 2021-11-24 NOTE — Progress Notes (Signed)
Discharge instructions (including medications) discussed with and copy provided to patient/caregiver 

## 2021-11-24 NOTE — Discharge Summary (Signed)
Physician Discharge Summary   Patient: [name] MRN: [id number] DOB: [birth year]  Admit date:     11/21/2021  Discharge date: 11/24/21  Discharge Physician: Meredeth Ide   PCP: Storm Frisk, MD   Recommendations at discharge:   Follow-up heart failure clinic in 1 week  Discharge Diagnoses: Principal Problem:   Acute kidney injury superimposed on chronic kidney disease (HCC) Active Problems:   Heart failure with reduced ejection fraction (HCC)- s/p ICD and pacemaker    GERD (gastroesophageal reflux disease)   History of CVA with residual deficit   Obesity (BMI 30-39.9)  Resolved Problems:   * No resolved hospital problems. *  Hospital Course:  71 year old female with past medical history of HFrEF, hypertension, CVA, dyslipidemia, obesity presented with hyperkalemia.  Patient is dose of furosemide was increased due to volume overload with increased potassium supplements.  She started having nausea vomiting for 2 days along with diarrhea.  As outpatient was found to have hyperkalemia and was referred to hospital for further evaluation. Patient was found to have potassium of 5.8  Assessment and Plan:   Acute kidney injury superimposed on CKD stage III a -Patient baseline creatinine was 1.28 on 11/09/2021 -Creatinine today improved to 1.80 -We will discontinue IV fluids  HFrEF s/p ICD/pacemaker -No clinical signs symptoms of decompensation -Started on gentle IV fluids as above -Diuretics on hold -Discussed with heart failure team, they have seen the patient and recommend to hold diuretics, Entresto, Aldactone -Can restart taking Coreg 6.25 mg p.o. twice a day from 11/25/2021 if systolic blood pressure remains greater than 110     History of CVA with residual deficit -Continue Plavix, atorvastatin    Obesity -BMI 31.5       Consultants: Cardiology Procedures performed: None Disposition: Home Diet recommendation:  Discharge Diet Orders (From admission,  onward)     Start     Ordered   11/24/21 0000  Diet - low sodium heart healthy        11/24/21 1424           Cardiac diet DISCHARGE MEDICATION: Allergies as of 11/24/2021       Reactions   Voltaren [diclofenac] Rash        Medication List     STOP taking these medications    dapagliflozin propanediol 10 MG Tabs tablet Commonly known as: Farxiga   Entresto 49-51 MG Generic drug: sacubitril-valsartan   furosemide 40 MG tablet Commonly known as: LASIX   potassium chloride 10 MEQ tablet Commonly known as: KLOR-CON M   spironolactone 25 MG tablet Commonly known as: ALDACTONE       TAKE these medications    albuterol 108 (90 Base) MCG/ACT inhaler Commonly known as: VENTOLIN HFA Inhale 2 puffs into the lungs at bedtime as needed for wheezing or shortness of breath.   atorvastatin 80 MG tablet Commonly known as: LIPITOR Take 1 tablet (80 mg total) by mouth daily at 6 PM.   carvedilol 6.25 MG tablet Commonly known as: COREG Take 1 tablet (6.25 mg total) by mouth 2 (two) times daily with a meal.   cetirizine 10 MG tablet Commonly known as: ZYRTEC Take 1 tablet (10 mg total) by mouth daily.   clopidogrel 75 MG tablet Commonly known as: PLAVIX Take 1 tablet (75 mg total) by mouth daily.   fluticasone 50 MCG/ACT nasal spray Commonly known as: FLONASE Place 2 sprays into both nostrils daily.   pantoprazole 40 MG tablet Commonly known as: PROTONIX Take 1  tablet (40 mg total) by mouth daily.   TERCONAZOLE VA Place 1 Application vaginally as needed (itching).               Durable Medical Equipment  (From admission, onward)           Start     Ordered   11/24/21 1357  For home use only DME 3 n 1  Once        11/24/21 1356   11/24/21 1357  For home use only DME 4 wheeled rolling walker with seat  Once       Question:  Patient needs a walker to treat with the following condition  Answer:  Chronic systolic heart failure ([place])   11/24/21  1356            Follow-up Information     [facility] Follow up.   Specialty: Cardiology Why: Lab Appointment 11/13 at 12:30 PM  Provider Hospital Follow-up 11/16 at 9:30 AM  The Advanced Heart Failure at [facility] [place] (Dr. Claris Gladden office) Contact information: [address] [id number] [place] [phone]               Discharge Exam: Danley Danker Weights   11/21/21 1544 11/24/21 0500  Weight: 83.5 kg 83.3 kg   General-appears in no acute distress Heart-S1-S2, regular, no murmur auscultated Lungs-clear to auscultation bilaterally, no wheezing or crackles auscultated Abdomen-soft, nontender, no organomegaly Extremities-no edema in the lower extremities Neuro-alert, oriented x3, no focal deficit noted  Condition at discharge: good  The results of significant diagnostics from this hospitalization (including imaging, microbiology, ancillary and laboratory) are listed below for reference.   Imaging Studies: DG Chest 1 View  Result Date: 11/22/2021 CLINICAL DATA:  Congestive heart failure. EXAM: CHEST  1 VIEW COMPARISON:  11/21/2021 FINDINGS: Patient is rotated to the right. Cardiopericardial silhouette is at upper limits of normal for size. The lungs are clear without focal pneumonia, edema, pneumothorax or pleural effusion. Interstitial markings are diffusely coarsened with chronic features. Left-sided permanent pacemaker/AICD noted. Telemetry leads overlie the chest. IMPRESSION: Chronic interstitial coarsening without acute cardiopulmonary findings. Electronically Signed   By: Misty Stanley M.D.   On: 11/22/2021 06:08   US RENAL  Result Date: 11/21/2021 CLINICAL DATA:  Acute kidney injury EXAM: RENAL / URINARY TRACT ULTRASOUND COMPLETE COMPARISON:  Abdominal ultrasound dated 02/21/2016 FINDINGS: Right Kidney: Renal measurements: 8.6 x 4.3 x 4.1 cm = volume: 80.1 mL. Echogenicity  within normal limits. No mass or hydronephrosis visualized. Left Kidney: Renal measurements: 10.3 x 5.2 x 4.7 cm = volume: 132.7 mL. Echogenicity within normal limits. No mass or hydronephrosis visualized. Bladder: Appears normal for degree of bladder distention. Other: None. IMPRESSION: Negative renal ultrasound.  No hydronephrosis. Electronically Signed   By: Julian Hy M.D.   On: 11/21/2021 20:09   DG Chest Port 1 View  Result Date: 11/21/2021 CLINICAL DATA:  Shortness of breath. EXAM: PORTABLE CHEST 1 VIEW COMPARISON:  September 16, 2018. FINDINGS: The heart size and mediastinal contours are within normal limits. Stable position of left-sided defibrillator. Both lungs are clear. The visualized skeletal structures are unremarkable. IMPRESSION: No active disease. Electronically Signed   By: Marijo Conception M.D.   On: 11/21/2021 16:29    Microbiology: Results for orders placed or performed during the hospital encounter of 06/03/19  SARS CORONAVIRUS 2 (TAT 6-24 HRS) Nasopharyngeal Nasopharyngeal Swab     Status: None   Collection  Time: 06/03/19 10:40 AM   Specimen: Nasopharyngeal Swab  Result Value Ref Range Status   SARS Coronavirus 2 NEGATIVE NEGATIVE Final    Comment: (NOTE) SARS-CoV-2 target nucleic acids are NOT DETECTED. The SARS-CoV-2 RNA is generally detectable in upper and lower respiratory specimens during the acute phase of infection. Negative results do not preclude SARS-CoV-2 infection, do not rule out co-infections with other pathogens, and should not be used as the sole basis for treatment or other patient management decisions. Negative results must be combined with clinical observations, patient history, and epidemiological information. The expected result is Negative. Fact Sheet for Patients: SugarRoll.be Fact Sheet for Healthcare Providers: https://www.woods-mathews.com/ This test is not yet approved or cleared by the [place] FDA and  has been authorized for detection and/or diagnosis of SARS-CoV-2 by FDA under an Emergency Use Authorization (EUA). This EUA will remain  in effect (meaning this test can be used) for the duration of the COVID-19 declaration under Section 56 4(b)(1) of the Act, 21 U.S.C. section 360bbb-3(b)(1), unless the authorization is terminated or revoked sooner. Performed at [facility], [place] [address] [place], [place] [address]     Labs: CBC: Recent Labs  Lab 11/21/21 1625 11/21/21 1701 11/23/21 0330 11/24/21 0408  WBC 12.7*  --  9.7 14.7*  HGB 12.2 11.9* 10.0* 10.5*  HCT 37.4 35.0* 30.9* 32.4*  MCV 90.3  --  91.2 90.5  PLT 346  --  233 [id number]   Basic Metabolic Panel: Recent Labs  Lab 11/21/21 0841 11/21/21 1625 11/21/21 1701 11/21/21 2247 11/22/21 0422 11/23/21 0330 11/24/21 0408  NA 133* 135 135  --  134* 138 139  K 7.4* 5.9* 5.8* 4.9 4.7 4.2 4.4  CL 105 105 106  --  106 109 112*  CO2 16* 17*  --   --  16* 19* 19*  GLUCOSE 101* 125* 120*  --  106* 99 102*  BUN 68* 74* 80*  --  72* 54* 33*  CREATININE 5.32* 5.69* 6.10*  --  4.29* 2.71* 1.80*  CALCIUM 9.2 9.5  --   --  8.6* 8.9 8.9  MG  --   --   --   --   --  2.3  --    Liver Function Tests: Recent Labs  Lab 11/21/21 1625  AST 15  ALT 13  ALKPHOS 113  BILITOT 1.3*  PROT 8.1  ALBUMIN 4.0   CBG: Recent Labs  Lab 11/21/21 1630  GLUCAP 112*    Discharge time spent: greater than 30 minutes.  Signed: Oswald Hillock, MD [facility] 11/24/2021

## 2021-11-24 NOTE — Care Management Important Message (Signed)
Important Message  Patient Details  Name: [name] MRN: [id number] Date of Birth: October 23, 1950   Medicare Important Message Given:  Yes     Dorena Bodo 11/24/2021, 2:57 PM

## 2021-11-24 NOTE — Plan of Care (Signed)

## 2021-11-25 NOTE — TOC CM/SW Note (Signed)
11/25/2021 910 am Contacted [employer] to follow up on DME. States they attempted to delivery to home but there was no answer. States they will ship out to home. Isidoro Donning RN3 CCM, Heart Failure TOC CM [phone]

## 2021-11-28 ENCOUNTER — Telehealth: Payer: Self-pay

## 2021-11-28 ENCOUNTER — Ambulatory Visit (HOSPITAL_COMMUNITY)
Admit: 2021-11-28 | Discharge: 2021-11-28 | Disposition: A | Discharge status: Home / Self Care | Payer: Medicare Other | Source: Ambulatory Visit | Attending: Cardiology | Admitting: Cardiology

## 2021-11-28 DIAGNOSIS — I5022 Chronic systolic (congestive) heart failure: Secondary | ICD-10-CM | POA: Diagnosis present

## 2021-11-28 LAB — BASIC METABOLIC PANEL
Anion gap: 10 (ref 5–15)
BUN: 14 mg/dL (ref 8–23)
CO2: 20 mmol/L — ABNORMAL LOW (ref 22–32)
Calcium: 9 mg/dL (ref 8.9–10.3)
Chloride: 107 mmol/L (ref 98–111)
Creatinine, Ser: 0.98 mg/dL (ref 0.44–1.00)
GFR, Estimated: >60 mL/min (ref >60)
Glucose, Bld: 88 mg/dL (ref 70–99)
Potassium: 4.2 mmol/L (ref 3.5–5.1)
Sodium: 137 mmol/L (ref 135–145)

## 2021-11-28 LAB — BRAIN NATRIURETIC PEPTIDE: B Natriuretic Peptide: 104.5 pg/mL — ABNORMAL HIGH (ref 0.0–100.0)

## 2021-11-28 NOTE — Telephone Encounter (Signed)
Transition Care Management Follow-up Telephone Call Date of discharge and from where: 11/24/2021, [facility] How have you been since you were released from the hospital? She said she is getting ready to go to the lab for bloodwork. Any questions or concerns? No  Items Reviewed: Did the pt receive and understand the discharge instructions provided? Yes  Medications obtained and verified? Yes - she said she has all of her medications and understands that there are a number of meds that she needs to stop taking  Other? No  Any new allergies since your discharge? No  Dietary orders reviewed? Yes Do you have support at home? Yes   Home Care and Equipment/Supplies: Were home health services ordered? no If so, what is the name of the agency? N/a  Has the agency set up a time to come to the patient's home? not applicable Were any new equipment or medical supplies ordered?  Yes: 3:1 commode, rollator What is the name of the medical supply agency? [employer] [facility]  Were you able to get the supplies/equipment? yes Do you have any questions related to the use of the equipment or supplies? No  Functional Questionnaire: (I = Independent and D = Dependent) ADLs: she said she ambulates with a cane when going a short distance and she now has the rollator to use for longer distance ambulation.   Her husband assists with ADLs as needed.  She has a home BP monitor.   Follow up appointments reviewed:  PCP Hospital f/u appt confirmed?  She has an appointment with Dr Delford Field at [facility] - 02/22/2022 and said she will call to schedule an appointment to be seen sooner after she completes her upcoming cardiology appointments.    Specialist Hospital f/u appt confirmed? Yes  Scheduled to see [facility]- 12/01/2021, cardiology - 12/28/2021,   Are transportation arrangements needed? No - she said she uses [place]. I informed her that [facility] Medicare will usually pay for a limited number of rides to medical appointments.  If  their condition worsens, is the pt aware to call PCP or go to the Emergency Dept.? Yes Was the patient provided with contact information for the PCP's office or ED? Yes Was to pt encouraged to call back with questions or concerns? Yes

## 2021-11-29 NOTE — Progress Notes (Signed)
ICM remote transmission rescheduled from 11/13 to 12/12/2021.  Pt has in office HF clinic check on 11/16.

## 2021-12-01 ENCOUNTER — Encounter (HOSPITAL_COMMUNITY): Payer: Self-pay

## 2021-12-01 ENCOUNTER — Ambulatory Visit (HOSPITAL_COMMUNITY)
Admission: RE | Admit: 2021-12-01 | Discharge: 2021-12-01 | Disposition: A | Discharge status: Home / Self Care | Payer: Medicare Other | Source: Ambulatory Visit | Attending: Family Medicine | Admitting: Family Medicine

## 2021-12-01 ENCOUNTER — Telehealth: Payer: Self-pay | Admitting: *Deleted

## 2021-12-01 VITALS — BP 140/86 | HR 72 | Wt 187.4 lb

## 2021-12-01 DIAGNOSIS — K529 Noninfective gastroenteritis and colitis, unspecified: Secondary | ICD-10-CM | POA: Diagnosis not present

## 2021-12-01 DIAGNOSIS — Z79899 Other long term (current) drug therapy: Secondary | ICD-10-CM | POA: Insufficient documentation

## 2021-12-01 DIAGNOSIS — Z8673 Personal history of transient ischemic attack (TIA), and cerebral infarction without residual deficits: Secondary | ICD-10-CM | POA: Insufficient documentation

## 2021-12-01 DIAGNOSIS — E785 Hyperlipidemia, unspecified: Secondary | ICD-10-CM | POA: Diagnosis not present

## 2021-12-01 DIAGNOSIS — I428 Other cardiomyopathies: Secondary | ICD-10-CM | POA: Diagnosis not present

## 2021-12-01 DIAGNOSIS — Z95810 Presence of automatic (implantable) cardiac defibrillator: Secondary | ICD-10-CM | POA: Insufficient documentation

## 2021-12-01 DIAGNOSIS — I5022 Chronic systolic (congestive) heart failure: Secondary | ICD-10-CM

## 2021-12-01 DIAGNOSIS — Z7902 Long term (current) use of antithrombotics/antiplatelets: Secondary | ICD-10-CM | POA: Diagnosis not present

## 2021-12-01 DIAGNOSIS — Z8249 Family history of ischemic heart disease and other diseases of the circulatory system: Secondary | ICD-10-CM | POA: Diagnosis not present

## 2021-12-01 DIAGNOSIS — I447 Left bundle-branch block, unspecified: Secondary | ICD-10-CM | POA: Diagnosis not present

## 2021-12-01 DIAGNOSIS — I6340 Cerebral infarction due to embolism of unspecified cerebral artery: Secondary | ICD-10-CM | POA: Diagnosis not present

## 2021-12-01 MED ORDER — CARVEDILOL 3.125 MG PO TABS: 3.1250 mg | ORAL_TABLET | Freq: Two times a day (BID) | ORAL | 11 refills | Status: DC

## 2021-12-01 MED ORDER — DAPAGLIFLOZIN PROPANEDIOL 10 MG PO TABS: 10.0000 mg | ORAL_TABLET | Freq: Every day | ORAL | 11 refills | Status: DC

## 2021-12-01 NOTE — Progress Notes (Signed)
Patient ID: [name], female   DOB: 11/07/1950, 71 y.o.   MRN: [id number]     PCP: [name], MD Neurologist: Dr Leonie Man EP: Dr Caryl Comes.  HF Cardiologist: Dr Aundra Dubin   HPI: [name] is a 71 y.o. female with chronic systolic CHF with nonischemic cardiomypathy (no significant CAD) s/p Medtronic CRT-D, LBBB, mild-mod MR and CVA (09/2013).  Coronary angiography in 7/15 showed no significant CAD.  Admitted in 9/15 with right sided facial droop and expressive aphasia, CVA on MRI. Had TEE showing EF 15%.    QRS narrowed, so in 11/16 device was reprogrammed to VVI.   Echo in 12/17 showed EF up to 45-50%.  Echo in 3/19 showed EF 45-50% with moderate LVH and normal RV.   Echo was done in 4/21 showing EF decreased to 30-35% with LAD territory wall motion abnormalities.  LHC/RHC in 5/21 showed normal filling pressures and cardiac output, no significant CAD.  With persistent LBBB, CRT was activated by Dr. Caryl Comes in 9/21.   Echo 3/23 showed EF 35-40%, diffuse hypokinesis, mildly decreased RV systolic function.   Follow up 11/09/21 volume overloaded & diuretics/GDMT adjusted. Follow up labs showed AKI and hyperkalemia, with SCr up to 5.32 and K 7.4. She had a 2-day history of N/V/D. Instructed to go to ED. She was admitted, GDTM held, given IVF and hyperkalemia corrected. AHF consulted to restart GDMT. Coreg restarted, all other GDMT held. She was discharged home, weight 184 lbs.  Today she returns for post hospital HF follow up with her husband. Overall feeling fair, remains weak. She was able to walk from across the street to clinic today, had to stop twice. Continues to struggle with balance but no falls. Denies abnormal bleeding, CP, dizziness, edema, or PND/Orthopnea. Appetite ok. No fever or chills. Weight at home 184 pounds. Taking all medications. BP at home 110-120/80s. Has not started Coreg yet. Eating out frequently since discharge.  Medtronic device interrogation: OptiVol up,  decreased thoracic impedance, no AF/VT, >99% BiV pacing, < 1 hr day/activity.  Labs (2/21): K 3.8, creatinine 0.96, LDL 105  Labs (9/21): LDL 92 Labs (12/21): K 3.7, creatinine 0.87 Labs (4/22): LDL 66 Labs (8/22): K 3.9, creatinine 1.12 Labs (1/23): K 4, creatinine 1.14 Labs (3/23): LDL 71, HDL 34 Labs (6/23): K 5.0, creatinine 1.61 Labs (10/23): K 4.4, creatinine 1.96 Labs (11/23): K 4.2, creatinine 0.98  ECG (personally reviewed): NSR, V pacing  PMH: 1. Nonischemic cardiomyopathy: Echo (7/15) with severe LV dilation, EF 25% with diffuse hypokinesis, mild to moderate MR.  RHC/ LHC 08/08/13 with RA 4, PA 23/8, PCWP 11, CI 2.52; no CAD.  TEE (9/15) with EF 15-20%, severe global hypokinesis, no LV thrombus, moderate MR, normal RV.  Echo (10/2013): EF 25-30%, grade I DD, mild AI. Medtronic CRT-D (10/15).  Echo (2/16) with EF 25-30%, diffuse hypokinesis worse in the septum, grade I diastolic dysfunction, normal RV size and systolic function, mild-moderate MR.  - Echo (8/16) with EF 35-40%, diffuse hypokinesis, mild-moderate mitral regurgitation.  - Echo (12/17): EF 45-50%, mild LVH, mild to moderate MR.  - Echo (3/19): EF 45-50% with moderate LVH, mild MR, normal RV size and systolic function.  - Echo (4/21): EF 30-35% with LAD territory wall motion abnormalities. - LHC/RHC (5/21): No significant CAD; mean RA 6, PA 31/13, mean PCWP 13, CI 3.37.  - Echo (3/23): EF 35-40%, diffuse hypokinesis, mildly decreased RV systolic function. 2. CVA: 9/15 with dysarthria.  Carotids 9/15 with no significant CAD.  -  Carotid dopplers (10/17): mild plaque, normal subclavians.  3. LBBB: Medtronic CRT-D (10/2013) 4. Hyperlipidemia 5. Cholelithiasis 6. Peripheral arterial dopplers (6/19): Normal  SH: lives with her son and husband. Does not drink or smoke. Currently not working.   FH: Mom had A fib and Heart Failure. Deceased 14        Sister has lupus  ROS: All systems negative except as listed in HPI,  PMH and Problem List.  Current Outpatient Medications  Medication Sig Dispense Refill   albuterol (VENTOLIN HFA) 108 (90 Base) MCG/ACT inhaler Inhale 2 puffs into the lungs at bedtime as needed for wheezing or shortness of breath. 18 g 3   atorvastatin (LIPITOR) 80 MG tablet Take 1 tablet (80 mg total) by mouth daily at 6 PM. 90 tablet 3   cetirizine (ZYRTEC) 10 MG tablet Take 1 tablet (10 mg total) by mouth daily. 60 tablet 3   clopidogrel (PLAVIX) 75 MG tablet Take 1 tablet (75 mg total) by mouth daily. 90 tablet 3   fluticasone (FLONASE) 50 MCG/ACT nasal spray Place 2 sprays into both nostrils daily. 16 g 1   pantoprazole (PROTONIX) 40 MG tablet Take 1 tablet (40 mg total) by mouth daily. 60 tablet 3   TERCONAZOLE VA Place 1 Application vaginally as needed (itching).     carvedilol (COREG) 6.25 MG tablet Take 1 tablet (6.25 mg total) by mouth 2 (two) times daily with a meal. (Patient not taking: Reported on 12/01/2021) 180 tablet 3   No current facility-administered medications for this encounter.   BP (!) 140/86   Pulse 72   Wt 85 kg (187 lb 6.4 oz)   SpO2 100%   BMI 32.17 kg/m   Wt Readings from Last 3 Encounters:  12/01/21 85 kg (187 lb 6.4 oz)  11/24/21 83.3 kg (183 lb 10.3 oz)  11/09/21 83.7 kg (184 lb 9.6 oz)   PHYSICAL EXAM: General:  NAD. No resp difficulty, walking into clinic with RW HEENT: Normal Neck: Supple. JVP 8-9 Carotids 2+ bilat; no bruits. No lymphadenopathy or thryomegaly appreciated. Cor: PMI nondisplaced. Regular rate & rhythm. No rubs, gallops or murmurs. Lungs: Clear Abdomen: Soft, nontender, nondistended. No hepatosplenomegaly. No bruits or masses. Good bowel sounds. Extremities: No cyanosis, clubbing, rash, edema Neuro: Alert & oriented x 3, cranial nerves grossly intact. Moves all 4 extremities w/o difficulty. Affect pleasant.  ASSESSMENT & PLAN: 1. Chronic Systolic Heart Failure: Nonischemic cardiomyopathy s/p Medtronic CRT-D in 10/15, coronary  angiography without CAD in 07/2013. Echo done in 2/16, EF 25-30%.  Repeat echo in 8/16 with EF up to 35-40%.  Echo in 4/19 showed EF up to 45-50%.  However, echo in 4/21 showed EF back down to 30-35%.  LHC/RHC in 5/21 showed no CAD, normal filling pressures, preserved cardiac output.  Of note, Medtronic device had been reprogrammed to VVI as LBBB has been only intermittent => more recently, LBBB has been persistent and CRT was reactivated in 9/21. Echo 3/23 showed EF 35-40%, diffuse hypokinesis, mildly decreased RV systolic function.  NYHA III symptoms, she mildly volume overloaded by exam and OptiVol tending up. GDMT limited by recent AKI and hyperkalemia. - Restart Farxiga 10 mg daily. BMET and BNP today. - Restart Coreg 6.25 mg bid.  - She has Lasix at home for PRN use. - Plan to add back spiro and Entresto as able. 2. CVA: ?Embolic.  No atrial fibrillation has been detected so far on device.  She will continue Plavix for now. If future device interrogation  shows atrial fibrillation, she will need to be anticoagulated.   3. Hyperlipidemia: Goal LDL < 70 with history of CVA.  Continue statin.  - Good lipids 3/23. 4. Gastroenteritis: Resolved. Instructed her to hold GDMT if this recurs and call clinic for further medication instructions.   Follow up in 3 weeks with PharmD (restart spiro), 6 weeks with APP (restart Entresto) and 12 weeks with Dr. Wynema Birch [facility] FNP-BC 12/01/2021

## 2021-12-01 NOTE — Patient Instructions (Signed)
RESTART Coreg 3.125 mg one tab twice a day RESTART Farxiga 10 mg one tab daily  Labs today We will only contact you if something comes back abnormal or we need to make some changes. Otherwise no news is good news!  Your physician recommends that you schedule a follow-up appointment in: 3 weeks with pharmacy team, 6 weeks  in the Advanced Practitioners (PA/NP) Clinic, and in 12 weeks with Dr Shirlee Latch   Do the following things EVERYDAY: Weigh yourself in the morning before breakfast. Write it down and keep it in a log. Take your medicines as prescribed Eat low salt foods--Limit salt (sodium) to 2000 mg per day.  Stay as active as you can everyday Limit all fluids for the day to less than 2 liters   At the Advanced Heart Failure Clinic, you and your health needs are our priority. As part of our continuing mission to provide you with exceptional heart care, we have created designated Provider Care Teams. These Care Teams include your primary Cardiologist (physician) and Advanced Practice Providers (APPs- Physician Assistants and Nurse Practitioners) who all work together to provide you with the care you need, when you need it.   You may see any of the following providers on your designated Care Team at your next follow up: Dr Arvilla Meres Dr [name] Dr. Marcos Eke, NP Robbie Lis, [facility] [place], [place] Brynda Peon, NP Karle Plumber, PharmD   Please be sure to bring in all your medications bottles to every appointment.   If you have any questions or concerns before your next appointment please send [place] a message through [place] or call our office at [phone].    TO LEAVE A MESSAGE FOR THE NURSE SELECT OPTION 2, PLEASE LEAVE A MESSAGE INCLUDING: YOUR NAME DATE OF BIRTH CALL BACK NUMBER REASON FOR CALL**this is important as we prioritize the call backs  YOU WILL RECEIVE A CALL BACK THE SAME DAY AS LONG AS YOU CALL BEFORE 4:00 PM

## 2021-12-02 ENCOUNTER — Other Ambulatory Visit: Payer: Self-pay | Admitting: Cardiology

## 2021-12-02 ENCOUNTER — Ambulatory Visit: Payer: Medicare Other | Attending: Family Medicine

## 2021-12-02 ENCOUNTER — Other Ambulatory Visit: Payer: Self-pay | Admitting: *Deleted

## 2021-12-02 DIAGNOSIS — E785 Hyperlipidemia, unspecified: Secondary | ICD-10-CM

## 2021-12-02 DIAGNOSIS — Z79899 Other long term (current) drug therapy: Secondary | ICD-10-CM

## 2021-12-02 NOTE — Progress Notes (Addendum)
Per [name]: Pt came here for labs. Last seen by Mclean at heart failure 12/01/21. States she is here for lipids and liver, but order is entered as hospital lab collect. Will enter new order at this time so she can get drawn here.  Mellody Dance returns and states that he was mistaken and the order needed to be for BNP, and BMP. I saw that order in the computer, but assumed this was additional testing and couldn't get lab on the phone again after initial conversation. Will delete lipid/lover and re-order BMET and BNP as clinic collect.

## 2021-12-02 NOTE — Addendum Note (Signed)
Addended by: Lars Mage on: 12/02/2021 08:58 AM   Modules accepted: Orders

## 2021-12-03 LAB — BASIC METABOLIC PANEL
BUN/Creatinine Ratio: 13 (ref 12–28)
BUN: 11 mg/dL (ref 8–27)
CO2: 15 mmol/L — ABNORMAL LOW (ref 20–29)
Calcium: 9 mg/dL (ref 8.7–10.3)
Chloride: 109 mmol/L — ABNORMAL HIGH (ref 96–106)
Creatinine, Ser: 0.85 mg/dL (ref 0.57–1.00)
Glucose: 105 mg/dL — ABNORMAL HIGH (ref 70–99)
Potassium: 3.7 mmol/L (ref 3.5–5.2)
Sodium: 142 mmol/L (ref 134–144)
eGFR: 73 mL/min/{1.73_m2} (ref >59)

## 2021-12-03 LAB — PRO B NATRIURETIC PEPTIDE: NT-Pro BNP: 1010 pg/mL — ABNORMAL HIGH (ref 0–301)

## 2021-12-03 LAB — SPECIMEN STATUS REPORT

## 2021-12-12 ENCOUNTER — Encounter (HOSPITAL_COMMUNITY): Payer: Medicare Other

## 2021-12-12 ENCOUNTER — Ambulatory Visit (INDEPENDENT_AMBULATORY_CARE_PROVIDER_SITE_OTHER): Payer: Medicare Other

## 2021-12-12 DIAGNOSIS — I5022 Chronic systolic (congestive) heart failure: Secondary | ICD-10-CM

## 2021-12-12 DIAGNOSIS — Z95810 Presence of automatic (implantable) cardiac defibrillator: Secondary | ICD-10-CM

## 2021-12-12 MED ORDER — FUROSEMIDE 20 MG PO TABS: ORAL_TABLET | ORAL | 3 refills | Status: DC

## 2021-12-12 NOTE — Progress Notes (Signed)
  Received: Today [place], Anderson Malta, FNP  Freda Jaquith, Josephine Igo, RN She can start Lasix 40 mg daily + 20 KCL daily. She will need a BMET in 7-10 days please

## 2021-12-12 NOTE — Progress Notes (Signed)
Spoke with patient and advised [name] recommended she take Furosemide 40 mg every morning and 20 mg every evening.  If she is agreeable she can have BMET in a week.  Advised will place order for lab and to call back if she wants to schedule.  She repeated instructions back correctly.

## 2021-12-12 NOTE — Progress Notes (Signed)
  Received: Today [place], Anderson Malta, FNP  Dymphna Wadley, Josephine Igo, RN OK to keep potassium as is.  Increase Lasix to 40 mg q am and 20 mg q pm. She can have BMET in 7 days if she's agreeable.

## 2021-12-12 NOTE — Progress Notes (Signed)
EPIC Encounter for ICM Monitoring  Patient Name: [name] is a 70 y.o. female Date: 12/12/2021 Primary Care Physican: Storm Frisk, MD Primary Cardiologist: Shirlee Latch Electrophysiologist: Joycelyn Schmid Pacing: 96.4%         06/20/2021 Weight: 176 lbs  08/16/2021 Weight: 174 lbs 08/22/2021 Weight: 175 lbs 11/14/2021 Weight: 181 lbs  Battery Longevity: 5 months                                                          Attempted call to husband/patient and unable to reach.  Left message to return call. Transmission reviewed.  Hospitalization for Hyperkalemia 11/6.     Optivol thoracic impedance suggesting possible fluid accumulation starting 11/6 which correlates with hospitalization and stopping of Furosemide and Potassium.   Prescribed:  Furosemide, Potassium and Spironolactone stopped at 11/9 hospital discharge.  Per 11/16 HF clinic OV note, patient has Lasix PRN for home but is not listed in Epic.   Labs: 12/02/2021 Creatinine 0.85, BUN 11, Potassium 3.7, Sodium 142, GFR 73 11/28/2021 Creatinine 0.96, BUN 14, Potassium 4.2, Sodium 137, GFR >60  11/24/2021 Creatinine 1.80, BUN 33, Potassium 4.4, Sodium 139, GFR 30  11/23/2021 Creatinine 2.71, BUN 54, Potassium 4.2, Sodium 138, GFR 18  11/22/2021 Creatinine 4.29, BUN 72, Potassium 4.7, Sodium 134, GFR 10  11/09/2021 Creatinine 1.28, BUN 20, Potassium 3.1, Sodium 140, GFR 45 10/18/2021 Creatinine 1.96, BUN 46, Potassium 4.4, Sodium 134, GFR 27 10/05/2021 Creatinine 1.26, BUN 14, Potassium 3.1, Sodium 140, GFR 45 08/26/2021 Creatinine 1.37, BUN 18, Potassium 4.0, Sodium 141, GFR 41 08/11/2021 Creatinine 1.29, BUN 27, Potassium 4.4, Sodium 137, GFR 44 A complete set of results can be found in Results Review.   Recommendations:  Left voice mail with ICM number and encouraged to call back.  Will send to Prince Rome, NP or Dr Shirlee Latch for review if patient is reached.     Follow-up plan: ICM clinic phone appointment on 12/20/2021 to  recheck fluid levels.   91 day device clinic remote transmission 01/03/2022.   EP/Cardiology Office Visits:   01/12/2022 with HF Clinic.  12/28/2021 with Dr Graciela Husbands    Copy of ICM check sent to Dr. Graciela Husbands.   3 month ICM trend: 12/12/2021.    12-14 Month ICM trend:     Karie Soda, RN 12/12/2021 9:36 AM

## 2021-12-12 NOTE — Progress Notes (Signed)
Spoke with patient and heart failure questions reviewed.  Transmission results reviewed.  Pt asymptomatic for fluid accumulation.  Weight stable at 183-184 since hospital discharge for Hyperkalemia.    She reports difficulty in drawing labs and has a lot of bruising where she had an IV and blood was taken during hospitalization.  She hopes not have any labs drawn until some of the bruising can go away.   Diet: She reports drinking 64 oz fluid daily.  She did eat ham during Thanksgiving but they are cutting back on salt.     She reports taking PRN Furosemide 20 mg daily although it is no longer listed in Epic medications.   Per 11/16 HF clinic OV note, patient has Lasix PRN for home.  Advised to limit salt intake.  Copy sent to Prince Rome, NP for review and recommendations if needed.

## 2021-12-12 NOTE — Progress Notes (Signed)
Spoke with patient and advised of Jessica's recommendations to increase Lasix to 40 mg daily and 20 mEq of Potassium daily.  Also advised BMET needed in 7-10 days.      Pt reports she is already taking Lasix 20 mg in the morning and 20 mg in the evening.  Advised I provided Shanda Bumps with the information she was taking 20 mg Lasix daily and patient stated that is incorrect.  She is taking 20 mg twice a day.   Pt is declining to take any potassium at this time due to recent hospitalization for high potassium levels. She is declining BMET to drawn at this time because she is so sore every where from all the IV and labs taken during the hospitalization.  She said last lab drawn was taken from the knuckle.  Advised will send this corrected information to [facility] for further review.   Shanda Bumps, patient concerns are   She is already taking Lasix 20 mg twice a day (incorrect info earlier) She does not want to take Potassium at this time due to recent hospitalization for high levels Too sore to have labs drawn in the next 7-10 days and needs more time to heal before taking blood.    Shanda Bumps please advise.

## 2021-12-19 NOTE — Progress Notes (Signed)
Advanced Heart Failure Clinic Note   PCP: [name], MD Neurologist: Dr Leonie Man EP: Dr Caryl Comes.  HF Cardiologist: Dr Aundra Dubin     HPI:  Ms. Blan is a 71 y.o. female with chronic systolic CHF with nonischemic cardiomypathy (no significant CAD) s/p Medtronic CRT-D, LBBB, mild-mod MR and CVA (09/2013).   Coronary angiography in 07/2013 showed no significant CAD.  Admitted in 09/2013 with right sided facial droop and expressive aphasia, CVA on MRI. Had TEE showing EF 15%.     QRS narrowed, so in 11/2014 device was reprogrammed to VVI.    Echo in 12/2015 showed EF up to 45-50%.  Echo in 03/2017 showed EF 45-50% with moderate LVH and normal RV.    Echo was done in 04/2019 showing EF decreased to 30-35% with LAD territory wall motion abnormalities.  LHC/RHC in 05/2019 showed normal filling pressures and cardiac output, no significant CAD.  With persistent LBBB, CRT was activated by Dr. Caryl Comes in 09/2019.    Echo 03/2021 showed EF 35-40%, diffuse hypokinesis, mildly decreased RV systolic function.    Follow up 11/09/21 volume overloaded & diuretics/GDMT adjusted. Follow up labs showed AKI and hyperkalemia, with SCr up to 5.32 and K 7.4. She had a 2-day history of N/V/D. Instructed to go to ED. She was admitted, GDMT held, given IVF and hyperkalemia corrected. AHF consulted to restart GDMT. Carvedilol restarted, all other GDMT held. She was discharged home, weight 184 lbs.   Returned to [facility] for post hospital HF follow up with her husband. Overall was feeling fair, remained weak. She was able to walk from across the street to clinic today, had to stop twice. Continued to struggle with balance but no falls. Denied abnormal bleeding, CP, dizziness, edema, or PND/Orthopnea. Appetite was ok. No fever or chills. Weight at home was 184 pounds. Reported taking all medications. BP at home was 110-120/80s. Had not started carvedilol yet. Reported eating out frequently since discharge.    Today she  returns to HF clinic for pharmacist medication titration. At last visit with APP, carvedilol 3.125 mg BID and Farxiga 10 mg daily were restarted. On 12/12/21, Optivol showed fluid accumulation, so Lasix was changed from PRN to Lasix 40 mg QAM/20 mg QPM. Overall she is feeling well today. States she is feeling better since she left the hospital. No dizziness unless she "turns her head too fast". This is stable and has been present since her stroke. No CP or palpitations. States her breathing has been good. SOB worse when going uphill. Still gets SOB with mild activity, but this has improved. Weight at home has been stable at 178-180 lbs since increasing Lasix to 40 mg QAM/20 mg QPM. No LEE, PND or orthopnea. She restarted her spironolactone 25 mg daily and Entresto 49/51 mg BID this morning because she was feeling good.     HF Medications: Carvedilol 3.125 mg BID Farxiga 10 mg daily Lasix 40 mg QAM/20 mg QPM  Has the patient been experiencing any side effects to the medications prescribed?  no  Does the patient have any problems obtaining medications due to transportation or finances?   No; [employer] Medicare  Understanding of regimen: good Understanding of indications: good Potential of compliance: good Patient understands to avoid NSAIDs. Patient understands to avoid decongestants.    Pertinent Lab Values: 12/28/21: Serum creatinine 1.28, BUN 18, Potassium 3.1, Sodium 138  Vital Signs: Weight: 184.2 lbs (last clinic weight: 187.4 lbs) Blood pressure: 112/74  Heart rate: 67  Assessment/Plan: 1. Chronic Systolic Heart Failure: Nonischemic cardiomyopathy s/p Medtronic CRT-D in 10/2013, coronary angiography without CAD in 07/2013. Echo done in 02/2014, EF 25-30%.  Repeat echo in 08/2014 with EF up to 35-40%.  Echo in 04/2017 showed EF up to 45-50%.  However, echo in 04/2019 showed EF back down to 30-35%.  LHC/RHC in 05/2019 showed no CAD, normal filling pressures, preserved cardiac output.  Of note,  Medtronic device had been reprogrammed to VVI as LBBB has been only intermittent => more recently, LBBB has been persistent and CRT was reactivated in 09/2019. Echo 03/2021 showed EF 35-40%, diffuse hypokinesis, mildly decreased RV systolic function.   - NYHA III symptoms, euvolemic on exam.  - Labs from yesterdays visit with Dr. Graciela Husbands reviewed. Scr 1.28, K 3.1. K was low but she self-restarted her spironolactone and Entresto today. Recheck BMET on 01/12/22 - Decrease Lasix to 40 mg daily as she restarted her Entresto this morning. - Continue Entresto 49/51 mg BID.  - Continue spironolactone 25 mg daily. Recheck BMET at scheduled visit 01/12/22.  - Continue carvedilol 6.25 mg BID.  - Continue Farxiga 10 mg daily. 2. CVA: ?Embolic.  No atrial fibrillation has been detected so far on device.  She will continue Plavix for now. If future device interrogation shows atrial fibrillation, she will need to be anticoagulated.   3. Hyperlipidemia: Goal LDL < 70 with history of CVA.  Continue statin.  - Good lipids 03/2021. 4. Gastroenteritis: Resolved. She will hold GDMT if this recurs and call clinic for further medication instructions.  Follow up 2 weeks with APP Clinic    Karle Plumber, PharmD, BCPS, BCCP, CPP Heart Failure Clinic Pharmacist [phone]

## 2021-12-26 NOTE — Progress Notes (Signed)
No ICM remote transmission received for 12/20/2021 and next ICM transmission scheduled for 01/30/2022.

## 2021-12-27 DIAGNOSIS — Z95810 Presence of automatic (implantable) cardiac defibrillator: Secondary | ICD-10-CM | POA: Insufficient documentation

## 2021-12-28 ENCOUNTER — Encounter: Payer: Self-pay | Admitting: Internal Medicine

## 2021-12-28 ENCOUNTER — Ambulatory Visit: Payer: Medicare Other | Attending: Internal Medicine | Admitting: Internal Medicine

## 2021-12-28 VITALS — BP 108/62 | HR 83 | Ht 64.0 in | Wt 181.0 lb

## 2021-12-28 DIAGNOSIS — Z95810 Presence of automatic (implantable) cardiac defibrillator: Secondary | ICD-10-CM | POA: Diagnosis not present

## 2021-12-28 DIAGNOSIS — I5020 Unspecified systolic (congestive) heart failure: Secondary | ICD-10-CM | POA: Diagnosis not present

## 2021-12-28 DIAGNOSIS — I428 Other cardiomyopathies: Secondary | ICD-10-CM | POA: Diagnosis not present

## 2021-12-28 NOTE — Progress Notes (Signed)
Patient Care Team: Storm Frisk, MD as PCP - General (Pulmonary Disease) [name], MD as PCP - Advanced Heart Failure (Cardiology)   HPI  [name] is a 71 y.o. female seen in followup for Medtronic CRT-D 2015 for NICM; intermittent LBBB and CHF subsequent delayed activation of her LV lead with the caveats as noted below, i.e. diaphragmatic stimulation at most vectors and inability despite multiple reprogramming's to flip either lead I or lead V1 but alas...[name](See Below)   Interval hospitalization 11/23 secondary to nausea vomiting diarrhea hypokalemia and acute renal injury with a peak creatinine of 5.3 now back to baseline  The patient denies chest pain, nocturnal dyspnea or peripheral edema.  There have been no palpitations, lightheadedness or syncope.  Complains of an on exertion at about 100 to 200 feet two-pillow orthopnea.   She is S/p CVA without interval detection of atrial fibrillation.       DATE TEST EF   7/15 LHC  No Angiographic CAD  7 /15 Echo  20-25 %   12/17 Echo 45-50 %   3/19 Echo  45-50%   4/21 Echo  30-35%   5/21 LHC  No obstructive CAD  3/23 Echo   35-40%     Date Cr K Hgb  4/18 0.8 3.4   10/19  0.89 4.3   6/20 0.83 3.7   6/21 0.96 3.5 10.2  11/23 0.85<<5.32 3.7<<7.4         Past Medical History:  Diagnosis Date   A-fib [facility])    Arthritis    Automatic implantable cardioverter-defibrillator in situ    Cardiac resynchronization therapy defibrillator (CRT-D) in place    a. placed on 11/05/13.   GERD (gastroesophageal reflux disease)    HLD (hyperlipidemia)    Hypotension    LBBB (left bundle branch block)    Mitral regurgitation    a. Mild-mod MR by echo 07/2013.   PONV (postoperative nausea and vomiting)    Stroke (HCC) 09/23/2013   Systolic CHF (HCC)    a. Dx 07/2013 - due to NICM. Cath normal cors. EF 25%.    Past Surgical History:  Procedure Laterality Date   BI-VENTRICULAR IMPLANTABLE CARDIOVERTER DEFIBRILLATOR  N/A 11/05/2013   Procedure: BI-VENTRICULAR IMPLANTABLE CARDIOVERTER DEFIBRILLATOR  (CRT-D);  Surgeon: Duke Salvia, MD;  Location: [facility] CATH LAB;  Service: Cardiovascular;  Laterality: N/A;   BI-VENTRICULAR IMPLANTABLE CARDIOVERTER DEFIBRILLATOR  (CRT-D)  11/05/2013   CARDIAC CATHETERIZATION  07/2013   KNEE ARTHROSCOPY Right    LEFT AND RIGHT HEART CATHETERIZATION WITH CORONARY ANGIOGRAM N/A 08/08/2013   Procedure: LEFT AND RIGHT HEART CATHETERIZATION WITH CORONARY ANGIOGRAM;  Surgeon: [name], MD;  Location: [facility] CATH LAB;  Service: Cardiovascular;  Laterality: N/A;   RIGHT/LEFT HEART CATH AND CORONARY ANGIOGRAPHY N/A 06/05/2019   Procedure: RIGHT/LEFT HEART CATH AND CORONARY ANGIOGRAPHY;  Surgeon: [name], MD;  Location: [facility] INVASIVE CV LAB;  Service: Cardiovascular;  Laterality: N/A;   TEE WITHOUT CARDIOVERSION N/A 09/26/2013   Procedure: TRANSESOPHAGEAL ECHOCARDIOGRAM (TEE);  Surgeon: [name], MD;  Location: [facility] ENDOSCOPY;  Service: Cardiovascular;  Laterality: N/A;   TONSILLECTOMY     WRIST ARTHROSCOPY  12/21/2010   Procedure: ARTHROSCOPY WRIST;  Surgeon: [name], MD;  Location: [facility];  Service: Orthopedics;  Laterality: Right;  right wrist, repair TFCC on right, open reconstruction ECU sheath    Current Outpatient Medications  Medication Sig Dispense Refill   albuterol (VENTOLIN HFA) 108 (  90 Base) MCG/ACT inhaler Inhale 2 puffs into the lungs at bedtime as needed for wheezing or shortness of breath. 18 g 3   atorvastatin (LIPITOR) 80 MG tablet Take 1 tablet (80 mg total) by mouth daily at 6 PM. 90 tablet 3   carvedilol (COREG) 3.125 MG tablet Take 1 tablet (3.125 mg total) by mouth 2 (two) times daily with a meal. 60 tablet 11   cetirizine (ZYRTEC) 10 MG tablet Take 1 tablet (10 mg total) by mouth daily. 60 tablet 3   clopidogrel (PLAVIX) 75 MG tablet Take 1 tablet (75 mg total) by mouth daily. 90 tablet 3   dapagliflozin propanediol  (FARXIGA) 10 MG TABS tablet Take 1 tablet (10 mg total) by mouth daily before breakfast. 30 tablet 11   fluticasone (FLONASE) 50 MCG/ACT nasal spray Place 2 sprays into both nostrils daily. 16 g 1   furosemide (LASIX) 20 MG tablet Take 2 tablets (40 mg total) by mouth every morning AND 1 tablet (20 mg total) every evening. 270 tablet 3   pantoprazole (PROTONIX) 40 MG tablet Take 1 tablet (40 mg total) by mouth daily. 60 tablet 3   TERCONAZOLE VA Place 1 Application vaginally as needed (itching).     No current facility-administered medications for this visit.    Allergies  Allergen Reactions   Voltaren [Diclofenac] Rash    Review of Systems negative except from HPI and PMH  Physical Exam BP 108/62   Pulse 83   Ht 5\' 4"  (1.626 m)   Wt 181 lb (82.1 kg)   SpO2 98%   BMI 31.07 kg/m  Well developed and well nourished in no acute distress smells of tobacco HENT normal Neck supple with JVP-flat Clear Device pocket well healed; without hematoma or erythema.  There is no tethering  Regular rate and rhythm, no  murmur Abd-soft with active BS No Clubbing cyanosis  edema Skin-warm and dry A & Oriented  Grossly normal sensory and motor function  ECG sinus @ 85 P-synchronous/ AV  pacing  Neg QRS lead 1 and neg QRS lead v1 QRSd 120 msec  Device function is normal. Programming changes   See Paceart for details    ECG sinus rhythm with QRS duration of 148 ms left bundle branch block 2022-- Multiple reprogramming's were attempted.  Unfortunately at 3.5 V every vector attempted resulted in diaphragmatic stimulation.  We then elected 1-4 at 1.5 V at 1.0 ms.  This resulted in narrowing of the QRS by about 20 ms.  At no point could I get QRS upright in lead V1 or negative in lead I without diaphragmatic stimulation    Assessment and  Plan   Nonischemic cardio myopathy  ICD-CRT Medtronic   Congestive heart failure-chronic systolic  Left bundle branch block   Prior stroke with  residual weakness    No intercurrent atrial fibrillation  Euvolemic.  Continue diuretic; not on potassium replacement at this point.  Will check a metabolic profile  Dyspnea on exertion.  CRT has demonstrated electrical resynchronization which is a surprising change.  QRS duration about 120 ms  Device is approaching ERI.  Will see again in 4 months

## 2021-12-28 NOTE — Patient Instructions (Addendum)
Medication Instructions:  Your physician recommends that you continue on your current medications as directed. Please refer to the Current Medication list given to you today.  *If you need a refill on your cardiac medications before your next appointment, please call your pharmacy*   Lab Work: BMET today If you have labs (blood work) drawn today and your tests are completely normal, you will receive your results only by: MyChart Message (if you have MyChart) OR A paper copy in the mail If you have any lab test that is abnormal or we need to change your treatment, we will call you to review the results.   Testing/Procedures: None ordered.    Follow-Up: At [facility], you and your health needs are our priority.  As part of our continuing mission to provide you with exceptional heart care, we have created designated Provider Care Teams.  These Care Teams include your primary Cardiologist (physician) and Advanced Practice Providers (APPs -  Physician Assistants and Nurse Practitioners) who all work together to provide you with the care you need, when you need it.  We recommend signing up for the patient portal called "MyChart".  Sign up information is provided on this After Visit Summary.  MyChart is used to connect with patients for Virtual Visits (Telemedicine).  Patients are able to view lab/test results, encounter notes, upcoming appointments, etc.  Non-urgent messages can be sent to your provider as well.   To learn more about what you can do with MyChart, go to [email or web address].    Your next appointment:   4 months with Dr Graciela Husbands - ICD/MDT  Important Information About Sugar

## 2021-12-29 ENCOUNTER — Ambulatory Visit (HOSPITAL_COMMUNITY)
Admission: RE | Admit: 2021-12-29 | Discharge: 2021-12-29 | Disposition: A | Discharge status: Home / Self Care | Payer: Medicare Other | Source: Ambulatory Visit | Attending: Cardiology | Admitting: Cardiology

## 2021-12-29 VITALS — BP 112/74 | HR 67 | Wt 184.2 lb

## 2021-12-29 DIAGNOSIS — I110 Hypertensive heart disease with heart failure: Secondary | ICD-10-CM | POA: Insufficient documentation

## 2021-12-29 DIAGNOSIS — E785 Hyperlipidemia, unspecified: Secondary | ICD-10-CM | POA: Insufficient documentation

## 2021-12-29 DIAGNOSIS — I5022 Chronic systolic (congestive) heart failure: Secondary | ICD-10-CM | POA: Diagnosis not present

## 2021-12-29 DIAGNOSIS — I428 Other cardiomyopathies: Secondary | ICD-10-CM | POA: Diagnosis not present

## 2021-12-29 DIAGNOSIS — I639 Cerebral infarction, unspecified: Secondary | ICD-10-CM | POA: Diagnosis not present

## 2021-12-29 LAB — BASIC METABOLIC PANEL
BUN/Creatinine Ratio: 14 (ref 12–28)
BUN: 18 mg/dL (ref 8–27)
CO2: 25 mmol/L (ref 20–29)
Calcium: 9.9 mg/dL (ref 8.7–10.3)
Chloride: 96 mmol/L (ref 96–106)
Creatinine, Ser: 1.28 mg/dL — ABNORMAL HIGH (ref 0.57–1.00)
Glucose: 117 mg/dL — ABNORMAL HIGH (ref 70–99)
Potassium: 3.1 mmol/L — ABNORMAL LOW (ref 3.5–5.2)
Sodium: 138 mmol/L (ref 134–144)
eGFR: 45 mL/min/{1.73_m2} — ABNORMAL LOW (ref >59)

## 2021-12-29 MED ORDER — FUROSEMIDE 40 MG PO TABS: 40.0000 mg | ORAL_TABLET | Freq: Every day | ORAL | 3 refills | Status: DC

## 2021-12-29 MED ORDER — SPIRONOLACTONE 25 MG PO TABS: 25.0000 mg | ORAL_TABLET | Freq: Every day | ORAL | 3 refills | Status: DC

## 2021-12-29 MED ORDER — SACUBITRIL-VALSARTAN 49-51 MG PO TABS: 1.0000 | ORAL_TABLET | Freq: Two times a day (BID) | ORAL | 3 refills | Status: DC

## 2021-12-29 NOTE — Patient Instructions (Signed)
It was a pleasure seeing you today!  MEDICATIONS: -We are changing your medications today -Start Entresto 49/51 mg BID -Start spironolactone 25 mg (1 tablet) daily -Decrease Lasix to 40 mg (1 tablet) daily -Call if you have questions about your medications.   NEXT APPOINTMENT: Return to clinic in 2 weeks with APP Clinic.  In general, to take care of your heart failure: -Limit your fluid intake to 2 Liters (half-gallon) per day.   -Limit your salt intake to ideally 2-3 grams (2000-3000 mg) per day. -Weigh yourself daily and record, and bring that "weight diary" to your next appointment.  (Weight gain of 2-3 pounds in 1 day typically means fluid weight.) -The medications for your heart are to help your heart and help you live longer.   -Please contact us before stopping any of your heart medications.  Call the clinic at [phone] with questions or to reschedule future appointments.

## 2022-01-03 ENCOUNTER — Ambulatory Visit: Payer: Medicare Other

## 2022-01-04 ENCOUNTER — Telehealth: Payer: Self-pay

## 2022-01-04 LAB — CUP PACEART REMOTE DEVICE CHECK
Battery Remaining Longevity: 6 mo
Battery Voltage: 2.82 V
Brady Statistic AP VP Percent: 0 %
Brady Statistic AP VS Percent: 0 %
Brady Statistic AS VP Percent: 98.3 %
Brady Statistic AS VS Percent: 1.69 %
Brady Statistic RA Percent Paced: 0 %
Brady Statistic RV Percent Paced: 4.33 %
Date Time Interrogation Session: 20231219171600
HighPow Impedance: 89 Ohm
Implantable Lead Connection Status: 753985
Implantable Lead Connection Status: 753985
Implantable Lead Connection Status: 753985
Implantable Lead Implant Date: 20151021
Implantable Lead Implant Date: 20151021
Implantable Lead Implant Date: 20151021
Implantable Lead Location: 753858
Implantable Lead Location: 753859
Implantable Lead Location: 753860
Implantable Lead Model: 4298
Implantable Lead Model: 5076
Implantable Pulse Generator Implant Date: 20151021
Lead Channel Impedance Value: 1083 Ohm
Lead Channel Impedance Value: 1121 Ohm
Lead Channel Impedance Value: 1121 Ohm
Lead Channel Impedance Value: 1178 Ohm
Lead Channel Impedance Value: 1197 Ohm
Lead Channel Impedance Value: 513 Ohm
Lead Channel Impedance Value: 551 Ohm
Lead Channel Impedance Value: 551 Ohm
Lead Channel Impedance Value: 589 Ohm
Lead Channel Impedance Value: 665 Ohm
Lead Channel Impedance Value: 665 Ohm
Lead Channel Impedance Value: 722 Ohm
Lead Channel Impedance Value: 836 Ohm
Lead Channel Pacing Threshold Amplitude: 0.5 V
Lead Channel Pacing Threshold Amplitude: 0.625 V
Lead Channel Pacing Threshold Amplitude: 1.125 V
Lead Channel Pacing Threshold Pulse Width: 0.4 ms
Lead Channel Pacing Threshold Pulse Width: 0.4 ms
Lead Channel Pacing Threshold Pulse Width: 0.5 ms
Lead Channel Sensing Intrinsic Amplitude: 16.5 mV
Lead Channel Sensing Intrinsic Amplitude: 16.5 mV
Lead Channel Sensing Intrinsic Amplitude: 4.25 mV
Lead Channel Sensing Intrinsic Amplitude: 4.25 mV
Lead Channel Setting Pacing Amplitude: 1.25 V
Lead Channel Setting Pacing Amplitude: 1.5 V
Lead Channel Setting Pacing Amplitude: 2 V
Lead Channel Setting Pacing Pulse Width: 0.4 ms
Lead Channel Setting Pacing Pulse Width: 1 ms
Lead Channel Setting Sensing Sensitivity: 0.3 mV
Zone Setting Status: 755011

## 2022-01-04 NOTE — Telephone Encounter (Signed)
Scheduled remote reviewed. Normal device function.   Battery estimated 31mo - route to triage Next remote to be determined.  ( Schedule shows device 1/15, 3/19, 6/18) LA   Needs monthly remote check schedule and to be notified.

## 2022-01-05 NOTE — Telephone Encounter (Signed)
I spoke with the patient and put her on monthly battery checks.

## 2022-01-06 ENCOUNTER — Telehealth: Payer: Self-pay

## 2022-01-06 DIAGNOSIS — E876 Hypokalemia: Secondary | ICD-10-CM

## 2022-01-06 NOTE — Telephone Encounter (Signed)
Spoke with pt and advised of Dr Odessa Fleming recommendation to start on KCL and recheck K+ level.  Pt refuses potassium supplement and states she has increased her high potassium foods of bananas and sweet potatoes.  She will come for repeat lab on 01/12/2022.

## 2022-01-06 NOTE — Telephone Encounter (Signed)
-----   Message from Duke Salvia, MD sent at 12/30/2021  4:03 PM EST ----- Please Inform Patient  Labs are normal x low potassium   can we start her on 20 meq bid for 3 days then 10 meq bid with recheck in 7 days plz   Thanks

## 2022-01-12 ENCOUNTER — Ambulatory Visit: Payer: Medicare Other | Attending: Cardiology

## 2022-01-12 ENCOUNTER — Encounter (HOSPITAL_COMMUNITY): Payer: Medicare Other

## 2022-01-12 DIAGNOSIS — E876 Hypokalemia: Secondary | ICD-10-CM

## 2022-01-12 LAB — BASIC METABOLIC PANEL
BUN/Creatinine Ratio: 12 (ref 12–28)
BUN: 12 mg/dL (ref 8–27)
CO2: 21 mmol/L (ref 20–29)
Calcium: 9.4 mg/dL (ref 8.7–10.3)
Chloride: 103 mmol/L (ref 96–106)
Creatinine, Ser: 0.99 mg/dL (ref 0.57–1.00)
Glucose: 105 mg/dL — ABNORMAL HIGH (ref 70–99)
Potassium: 4.1 mmol/L (ref 3.5–5.2)
Sodium: 139 mmol/L (ref 134–144)
eGFR: 60 mL/min/{1.73_m2} (ref >59)

## 2022-01-24 NOTE — Progress Notes (Signed)
Patient ID: [name], female   DOB: [birth year], 72 y.o.   MRN: [id number]     PCP: [name], MD Neurologist: Dr Leonie Man EP: Dr Caryl Comes.  HF Cardiologist: Dr Aundra Dubin   HPI: [name] is a 72 y.o. female with chronic systolic CHF with nonischemic cardiomypathy (no significant CAD) s/p Medtronic CRT-D, LBBB, mild-mod MR and CVA (09/2013).  Coronary angiography in 7/15 showed no significant CAD.  Admitted in 9/15 with right sided facial droop and expressive aphasia, CVA on MRI. Had TEE showing EF 15%.    QRS narrowed, so in 11/16 device was reprogrammed to VVI.   Echo in 12/17 showed EF up to 45-50%.  Echo in 3/19 showed EF 45-50% with moderate LVH and normal RV.   Echo was done in 4/21 showing EF decreased to 30-35% with LAD territory wall motion abnormalities.  LHC/RHC in 5/21 showed normal filling pressures and cardiac output, no significant CAD.  With persistent LBBB, CRT was activated by Dr. Caryl Comes in 9/21.   Echo 3/23 showed EF 35-40%, diffuse hypokinesis, mildly decreased RV systolic function.   Follow up 11/09/21 volume overloaded & diuretics/GDMT adjusted. Follow up labs showed AKI and hyperkalemia, with SCr up to 5.32 and K 7.4. She had a 2-day history of N/V/D. Instructed to go to ED. She was admitted, GDTM held, given IVF and hyperkalemia corrected. AHF consulted to restart GDMT. Coreg restarted, all other GDMT held. She was discharged home, weight 184 lbs.  Today she returns for post hospital HF follow up with her husband. Overall feeling fair, remains weak. She was able to walk from across the street to clinic today, had to stop twice. Continues to struggle with balance but no falls. Denies abnormal bleeding, CP, dizziness, edema, or PND/Orthopnea. Appetite ok. No fever or chills. Weight at home 184 pounds. Taking all medications. BP at home 110-120/80s. Has not started Coreg yet. Eating out frequently since discharge.  Medtronic device interrogation: OptiVol up,  decreased thoracic impedance, no AF/VT, >99% BiV pacing, < 1 hr day/activity.  Labs (2/21): K 3.8, creatinine 0.96, LDL 105  Labs (9/21): LDL 92 Labs (12/21): K 3.7, creatinine 0.87 Labs (4/22): LDL 66 Labs (8/22): K 3.9, creatinine 1.12 Labs (1/23): K 4, creatinine 1.14 Labs (3/23): LDL 71, HDL 34 Labs (6/23): K 5.0, creatinine 1.61 Labs (10/23): K 4.4, creatinine 1.96 Labs (11/23): K 4.2, creatinine 0.98  ECG (personally reviewed): NSR, V pacing  PMH: 1. Nonischemic cardiomyopathy: Echo (7/15) with severe LV dilation, EF 25% with diffuse hypokinesis, mild to moderate MR.  RHC/ LHC 08/08/13 with RA 4, PA 23/8, PCWP 11, CI 2.52; no CAD.  TEE (9/15) with EF 15-20%, severe global hypokinesis, no LV thrombus, moderate MR, normal RV.  Echo (10/2013): EF 25-30%, grade I DD, mild AI. Medtronic CRT-D (10/15).  Echo (2/16) with EF 25-30%, diffuse hypokinesis worse in the septum, grade I diastolic dysfunction, normal RV size and systolic function, mild-moderate MR.  - Echo (8/16) with EF 35-40%, diffuse hypokinesis, mild-moderate mitral regurgitation.  - Echo (12/17): EF 45-50%, mild LVH, mild to moderate MR.  - Echo (3/19): EF 45-50% with moderate LVH, mild MR, normal RV size and systolic function.  - Echo (4/21): EF 30-35% with LAD territory wall motion abnormalities. - LHC/RHC (5/21): No significant CAD; mean RA 6, PA 31/13, mean PCWP 13, CI 3.37.  - Echo (3/23): EF 35-40%, diffuse hypokinesis, mildly decreased RV systolic function. 2. CVA: 9/15 with dysarthria.  Carotids 9/15 with no significant CAD.  -  Carotid dopplers (10/17): mild plaque, normal subclavians.  3. LBBB: Medtronic CRT-D (10/2013) 4. Hyperlipidemia 5. Cholelithiasis 6. Peripheral arterial dopplers (6/19): Normal  SH: lives with her son and husband. Does not drink or smoke. Currently not working.   FH: Mom had A fib and Heart Failure. Deceased 67        Sister has lupus  ROS: All systems negative except as listed in HPI,  PMH and Problem List.  Current Outpatient Medications  Medication Sig Dispense Refill   albuterol (VENTOLIN HFA) 108 (90 Base) MCG/ACT inhaler Inhale 2 puffs into the lungs at bedtime as needed for wheezing or shortness of breath. 18 g 3   atorvastatin (LIPITOR) 80 MG tablet Take 1 tablet (80 mg total) by mouth daily at 6 PM. 90 tablet 3   carvedilol (COREG) 3.125 MG tablet Take 1 tablet (3.125 mg total) by mouth 2 (two) times daily with a meal. 60 tablet 11   cetirizine (ZYRTEC) 10 MG tablet Take 1 tablet (10 mg total) by mouth daily. 60 tablet 3   clopidogrel (PLAVIX) 75 MG tablet Take 1 tablet (75 mg total) by mouth daily. 90 tablet 3   dapagliflozin propanediol (FARXIGA) 10 MG TABS tablet Take 1 tablet (10 mg total) by mouth daily before breakfast. 30 tablet 11   fluticasone (FLONASE) 50 MCG/ACT nasal spray Place 2 sprays into both nostrils daily. 16 g 1   furosemide (LASIX) 40 MG tablet Take 1 tablet (40 mg total) by mouth daily. 90 tablet 3   pantoprazole (PROTONIX) 40 MG tablet Take 1 tablet (40 mg total) by mouth daily. 60 tablet 3   sacubitril-valsartan (ENTRESTO) 49-51 MG Take 1 tablet by mouth 2 (two) times daily. 180 tablet 3   spironolactone (ALDACTONE) 25 MG tablet Take 1 tablet (25 mg total) by mouth daily. 90 tablet 3   TERCONAZOLE VA Place 1 Application vaginally as needed (itching).     No current facility-administered medications for this visit.   There were no vitals taken for this visit.  Wt Readings from Last 3 Encounters:  12/29/21 83.6 kg (184 lb 3.2 oz)  12/28/21 82.1 kg (181 lb)  12/01/21 85 kg (187 lb 6.4 oz)   PHYSICAL EXAM: General:  NAD. No resp difficulty, walking into clinic with RW HEENT: Normal Neck: Supple. JVP 8-9 Carotids 2+ bilat; no bruits. No lymphadenopathy or thryomegaly appreciated. Cor: PMI nondisplaced. Regular rate & rhythm. No rubs, gallops or murmurs. Lungs: Clear Abdomen: Soft, nontender, nondistended. No hepatosplenomegaly. No bruits  or masses. Good bowel sounds. Extremities: No cyanosis, clubbing, rash, edema Neuro: Alert & oriented x 3, cranial nerves grossly intact. Moves all 4 extremities w/o difficulty. Affect pleasant.  ASSESSMENT & PLAN: 1. Chronic Systolic Heart Failure: Nonischemic cardiomyopathy s/p Medtronic CRT-D in 10/15, coronary angiography without CAD in 07/2013. Echo done in 2/16, EF 25-30%.  Repeat echo in 8/16 with EF up to 35-40%.  Echo in 4/19 showed EF up to 45-50%.  However, echo in 4/21 showed EF back down to 30-35%.  LHC/RHC in 5/21 showed no CAD, normal filling pressures, preserved cardiac output.  Of note, Medtronic device had been reprogrammed to VVI as LBBB has been only intermittent => more recently, LBBB has been persistent and CRT was reactivated in 9/21. Echo 3/23 showed EF 35-40%, diffuse hypokinesis, mildly decreased RV systolic function.  NYHA III symptoms, she mildly volume overloaded by exam and OptiVol tending up. GDMT limited by recent AKI and hyperkalemia. - Restart Farxiga 10 mg daily. BMET and  BNP today. - Restart Coreg 6.25 mg bid.  - She has Lasix at home for PRN use. - Plan to add back spiro and Entresto as able. 2. CVA: ?Embolic.  No atrial fibrillation has been detected so far on device.  She will continue Plavix for now. If future device interrogation shows atrial fibrillation, she will need to be anticoagulated.   3. Hyperlipidemia: Goal LDL < 70 with history of CVA.  Continue statin.  - Good lipids 3/23. 4. Gastroenteritis: Resolved. Instructed her to hold GDMT if this recurs and call clinic for further medication instructions.   Follow up in 3 weeks with PharmD (restart spiro), 6 weeks with APP (restart Entresto) and 12 weeks with Dr. Wynema Birch [facility] FNP-BC 01/24/2022

## 2022-01-25 ENCOUNTER — Ambulatory Visit (HOSPITAL_COMMUNITY)
Admission: RE | Admit: 2022-01-25 | Discharge: 2022-01-25 | Disposition: A | Discharge status: Home / Self Care | Payer: 59 | Source: Ambulatory Visit | Attending: Family Medicine | Admitting: Family Medicine

## 2022-01-25 ENCOUNTER — Encounter (HOSPITAL_COMMUNITY): Payer: Self-pay

## 2022-01-25 ENCOUNTER — Telehealth (HOSPITAL_COMMUNITY): Payer: Self-pay | Admitting: Cardiology

## 2022-01-25 VITALS — BP 100/60 | HR 71 | Ht 64.0 in | Wt 186.2 lb

## 2022-01-25 DIAGNOSIS — Z95810 Presence of automatic (implantable) cardiac defibrillator: Secondary | ICD-10-CM

## 2022-01-25 DIAGNOSIS — I447 Left bundle-branch block, unspecified: Secondary | ICD-10-CM | POA: Diagnosis not present

## 2022-01-25 DIAGNOSIS — Z8673 Personal history of transient ischemic attack (TIA), and cerebral infarction without residual deficits: Secondary | ICD-10-CM | POA: Insufficient documentation

## 2022-01-25 DIAGNOSIS — K529 Noninfective gastroenteritis and colitis, unspecified: Secondary | ICD-10-CM

## 2022-01-25 DIAGNOSIS — Z79899 Other long term (current) drug therapy: Secondary | ICD-10-CM | POA: Diagnosis not present

## 2022-01-25 DIAGNOSIS — I428 Other cardiomyopathies: Secondary | ICD-10-CM | POA: Diagnosis not present

## 2022-01-25 DIAGNOSIS — I6340 Cerebral infarction due to embolism of unspecified cerebral artery: Secondary | ICD-10-CM | POA: Diagnosis not present

## 2022-01-25 DIAGNOSIS — I5022 Chronic systolic (congestive) heart failure: Secondary | ICD-10-CM | POA: Diagnosis not present

## 2022-01-25 DIAGNOSIS — R252 Cramp and spasm: Secondary | ICD-10-CM | POA: Insufficient documentation

## 2022-01-25 DIAGNOSIS — Z7984 Long term (current) use of oral hypoglycemic drugs: Secondary | ICD-10-CM | POA: Diagnosis not present

## 2022-01-25 DIAGNOSIS — E785 Hyperlipidemia, unspecified: Secondary | ICD-10-CM | POA: Insufficient documentation

## 2022-01-25 DIAGNOSIS — Z139 Encounter for screening, unspecified: Secondary | ICD-10-CM | POA: Diagnosis not present

## 2022-01-25 DIAGNOSIS — Z7902 Long term (current) use of antithrombotics/antiplatelets: Secondary | ICD-10-CM | POA: Diagnosis not present

## 2022-01-25 LAB — BASIC METABOLIC PANEL
Anion gap: 11 (ref 5–15)
BUN: 11 mg/dL (ref 8–23)
CO2: 27 mmol/L (ref 22–32)
Calcium: 8.8 mg/dL — ABNORMAL LOW (ref 8.9–10.3)
Chloride: 101 mmol/L (ref 98–111)
Creatinine, Ser: 0.99 mg/dL (ref 0.44–1.00)
GFR, Estimated: >60 mL/min (ref >60)
Glucose, Bld: 105 mg/dL — ABNORMAL HIGH (ref 70–99)
Potassium: 2.7 mmol/L — CL (ref 3.5–5.1)
Sodium: 139 mmol/L (ref 135–145)

## 2022-01-25 MED ORDER — POTASSIUM CHLORIDE CRYS ER 20 MEQ PO TBCR: EXTENDED_RELEASE_TABLET | ORAL | 3 refills | Status: DC

## 2022-01-25 NOTE — Telephone Encounter (Signed)
-----   Message from Rafael Bihari, [place] sent at 01/25/2022 11:50 AM EST ----- K is too low. Please take 40 KCL bid x 3 days, then 40 daily thereafter. Please call

## 2022-01-25 NOTE — Patient Instructions (Addendum)
Thank you for coming in today  Labs were done today, if any labs are abnormal the clinic will call you No news is good news  Your physician recommends that you schedule a follow-up appointment in:  2-3 months with Dr. Aundra Dubin     Do the following things EVERYDAY: Weigh yourself in the morning before breakfast. Write it down and keep it in a log. Take your medicines as prescribed Eat low salt foods--Limit salt (sodium) to 2000 mg per day.  Stay as active as you can everyday Limit all fluids for the day to less than 2 liters  At the [facility], you and your health needs are our priority. As part of our continuing mission to provide you with exceptional heart care, we have created designated Provider Care Teams. These Care Teams include your primary Cardiologist (physician) and Advanced Practice Providers (APPs- Physician Assistants and Nurse Practitioners) who all work together to provide you with the care you need, when you need it.   You may see any of the following providers on your designated Care Team at your next follow up: Dr Glori Bickers Dr Loralie Champagne Dr. Roxana Hires, NP Lyda Jester, [place] [facility] [place], [place] Forestine Na, NP Audry Riles, PharmD   Please be sure to bring in all your medications bottles to every appointment.   If you have any questions or concerns before your next appointment please send [place] a message through [place] or call our office at [phone].    TO LEAVE A MESSAGE FOR THE NURSE SELECT OPTION 2, PLEASE LEAVE A MESSAGE INCLUDING: YOUR NAME DATE OF BIRTH CALL BACK NUMBER REASON FOR CALL**this is important as we prioritize the call backs  YOU WILL RECEIVE A CALL BACK THE SAME DAY AS LONG AS YOU CALL BEFORE 4:00 PM

## 2022-01-25 NOTE — Telephone Encounter (Signed)
Patient called.  Patient and pts spouse aware Repeat labs 1/15

## 2022-01-25 NOTE — Progress Notes (Signed)
H&V Care Navigation CSW Progress Note  Clinical Social Worker consulted to speak with pt regarding concerns with transportation, home safety, meal delivery, obtaining a car.  CSW first discussed transportation.  Pt does not have a car and takes a taxi to appts.  Pt reports she is aware that insurance has transport benefit but that she has not utilized it.  CSW informed her she should call customer service line on back of card and work with rep to set up rides to future appts.  Pt also has Medicaid so CSW informed pt she could call [employer] to get established with Medicaid transportation once [facility] rides have been maxed out.  Pt also interested in referral to [place] which is a local agency that helps individuals get affordable cars.  CSW reviewed criteria on website and it requires a referral from a local approved agency which [facility] is not a part of.  CSW discussed some approved agencies and patient will plan to speak with [employer] regarding a referral as she has regular interactions with them.  Pt inquired about meals on wheels- states she has a referral in but hasn't heard anything.  CSW directed pt to call [employer] [facility] directly to inquire about status of referral.  Pt last concern was with safety of her tub at home.  CSW encouraged her to speak with her apartment complex about installing safety bars or a walk in tub and that if they need documentation from a physician she could reach out to our office and I would assist as needed.  Also states she used to get fresh produce delivered to her home for free but can't remember from where.  CSW provided with information regarding local pantry through church which have provided delivery services in the past.  [place]: No Food Insecurity (11/23/2021)  Housing: Low Risk  (11/24/2021)  Transportation Needs: No Transportation Needs (11/24/2021)  Utilities: Not At Risk (11/24/2021)  Depression (PHQ2-9): Low Risk   (10/19/2021)  Tobacco Use: Low Risk  (01/25/2022)   Will continue to follow through clinic and assist as needed  Jorge Ny, [facility] Desk#: [phone] Cell#: [phone]

## 2022-01-25 NOTE — Addendum Note (Signed)
Encounter addended by: Jorge Ny, LCSW on: 01/25/2022 10:55 AM  Actions taken: Flowsheet accepted, Clinical Note Signed

## 2022-01-30 ENCOUNTER — Ambulatory Visit (HOSPITAL_COMMUNITY)
Admission: RE | Admit: 2022-01-30 | Discharge: 2022-01-30 | Disposition: A | Discharge status: Home / Self Care | Payer: 59 | Source: Ambulatory Visit | Attending: Cardiology | Admitting: Cardiology

## 2022-01-30 DIAGNOSIS — I5022 Chronic systolic (congestive) heart failure: Secondary | ICD-10-CM | POA: Diagnosis not present

## 2022-01-30 LAB — BASIC METABOLIC PANEL
Anion gap: 13 (ref 5–15)
BUN: 12 mg/dL (ref 8–23)
CO2: 23 mmol/L (ref 22–32)
Calcium: 9.2 mg/dL (ref 8.9–10.3)
Chloride: 104 mmol/L (ref 98–111)
Creatinine, Ser: 0.99 mg/dL (ref 0.44–1.00)
GFR, Estimated: >60 mL/min (ref >60)
Glucose, Bld: 96 mg/dL (ref 70–99)
Potassium: 3.7 mmol/L (ref 3.5–5.1)
Sodium: 140 mmol/L (ref 135–145)

## 2022-01-30 NOTE — Progress Notes (Signed)
Next ICM transmission scheduled for 02/13/2022.

## 2022-01-31 ENCOUNTER — Ambulatory Visit: Payer: 59 | Attending: Internal Medicine

## 2022-01-31 DIAGNOSIS — I428 Other cardiomyopathies: Secondary | ICD-10-CM

## 2022-02-01 LAB — CUP PACEART REMOTE DEVICE CHECK
Battery Remaining Longevity: 6 mo
Battery Voltage: 2.83 V
Brady Statistic AP VP Percent: 0 %
Brady Statistic AP VS Percent: 0 %
Brady Statistic AS VP Percent: 98.49 %
Brady Statistic AS VS Percent: 1.51 %
Brady Statistic RA Percent Paced: 0.01 %
Brady Statistic RV Percent Paced: 3.1 %
Date Time Interrogation Session: 20240117131522
HighPow Impedance: 109 Ohm
Implantable Lead Connection Status: 753985
Implantable Lead Connection Status: 753985
Implantable Lead Connection Status: 753985
Implantable Lead Implant Date: 20151021
Implantable Lead Implant Date: 20151021
Implantable Lead Implant Date: 20151021
Implantable Lead Location: 753858
Implantable Lead Location: 753859
Implantable Lead Location: 753860
Implantable Lead Model: 4298
Implantable Lead Model: 5076
Implantable Pulse Generator Implant Date: 20151021
Lead Channel Impedance Value: 1007 Ohm
Lead Channel Impedance Value: 1026 Ohm
Lead Channel Impedance Value: 1083 Ohm
Lead Channel Impedance Value: 456 Ohm
Lead Channel Impedance Value: 475 Ohm
Lead Channel Impedance Value: 513 Ohm
Lead Channel Impedance Value: 513 Ohm
Lead Channel Impedance Value: 589 Ohm
Lead Channel Impedance Value: 608 Ohm
Lead Channel Impedance Value: 665 Ohm
Lead Channel Impedance Value: 703 Ohm
Lead Channel Impedance Value: 931 Ohm
Lead Channel Impedance Value: 950 Ohm
Lead Channel Pacing Threshold Amplitude: 0.5 V
Lead Channel Pacing Threshold Amplitude: 0.5 V
Lead Channel Pacing Threshold Amplitude: 1.125 V
Lead Channel Pacing Threshold Pulse Width: 0.4 ms
Lead Channel Pacing Threshold Pulse Width: 0.4 ms
Lead Channel Pacing Threshold Pulse Width: 0.5 ms
Lead Channel Sensing Intrinsic Amplitude: 16.5 mV
Lead Channel Sensing Intrinsic Amplitude: 16.5 mV
Lead Channel Sensing Intrinsic Amplitude: 4.125 mV
Lead Channel Sensing Intrinsic Amplitude: 4.125 mV
Lead Channel Setting Pacing Amplitude: 1.25 V
Lead Channel Setting Pacing Amplitude: 1.5 V
Lead Channel Setting Pacing Amplitude: 2 V
Lead Channel Setting Pacing Pulse Width: 0.4 ms
Lead Channel Setting Pacing Pulse Width: 1 ms
Lead Channel Setting Sensing Sensitivity: 0.3 mV
Zone Setting Status: 755011

## 2022-02-13 ENCOUNTER — Ambulatory Visit: Payer: 59 | Attending: Internal Medicine

## 2022-02-13 DIAGNOSIS — Z95810 Presence of automatic (implantable) cardiac defibrillator: Secondary | ICD-10-CM | POA: Diagnosis not present

## 2022-02-13 DIAGNOSIS — I5022 Chronic systolic (congestive) heart failure: Secondary | ICD-10-CM | POA: Diagnosis not present

## 2022-02-15 ENCOUNTER — Telehealth: Payer: Self-pay

## 2022-02-15 NOTE — Telephone Encounter (Signed)
Remote ICM transmission received.  Attempted call to patient regarding ICM remote transmission and no answer.  

## 2022-02-15 NOTE — Progress Notes (Signed)
EPIC Encounter for ICM Monitoring  Patient Name: [name] is a 72 y.o. female Date: 02/15/2022 Primary Care Physican: [name], MD Primary Cardiologist: Aundra Dubin Electrophysiologist: Vergie Living Pacing: 98%         06/20/2021 Weight: 176 lbs  08/16/2021 Weight: 174 lbs 08/22/2021 Weight: 175 lbs 11/14/2021 Weight: 181 lbs 12/28/2021 Office weight: 181 lbs   Battery Longevity: 6 months Next battery check 03/06/2022                                                          Attempted call to husband/patient and unable to reach.  Transmission reviewed.  Optivol thoracic impedance suggesting normal fluid levels 1/27-1-29 but was suggesting possible fluid accumulation 1/8-1/26.  Unable to maintain normal fluid levels since 04/2021.   Prescribed:  Furosemide 40 mg take 1 tablet by mouth daily Potassium 20 mEq take 2 tablets by mouth daily   Labs: 01/30/2022 Creatinine 0.99, BUN 12, Potassium 3.7, Sodium 140  01/25/2022 Creatinine 0.99, BUN 11, Potassium 2.7, Sodium 139  01/12/2022 Creatinine 0.99, BUN 12, Potassium 4.1, Sodium 139, GFR 60 12/28/2021 Creatinine 1.28, BUN 18, Potassium 3.1, Sodium 138, GFR 45  12/02/2021 Creatinine 0.85, BUN 11, Potassium 3.7, Sodium 142, GFR 73 11/28/2021 Creatinine 0.96, BUN 14, Potassium 4.2, Sodium 137, GFR >60  11/24/2021 Creatinine 1.80, BUN 33, Potassium 4.4, Sodium 139, GFR 30  11/23/2021 Creatinine 2.71, BUN 54, Potassium 4.2, Sodium 138, GFR 18  11/22/2021 Creatinine 4.29, BUN 72, Potassium 4.7, Sodium 134, GFR 10  11/09/2021 Creatinine 1.28, BUN 20, Potassium 3.1, Sodium 140, GFR 45 10/18/2021 Creatinine 1.96, BUN 46, Potassium 4.4, Sodium 134, GFR 27 10/05/2021 Creatinine 1.26, BUN 14, Potassium 3.1, Sodium 140, GFR 45 08/26/2021 Creatinine 1.37, BUN 18, Potassium 4.0, Sodium 141, GFR 41 08/11/2021 Creatinine 1.29, BUN 27, Potassium 4.4, Sodium 137, GFR 44 A complete set of results can be found in Results Review.   Recommendations:   Unable to reach.     Follow-up plan: ICM clinic phone appointment on 03/20/2022.   91 day device clinic remote transmission 04/04/2022.   EP/Cardiology Office Visits:   03/22/2022 with Dr Aundra Dubin.  Last appointment was 12/28/2021 with Dr Caryl Comes (no recall for future appointment)    Copy of ICM check sent to Dr. Caryl Comes.  3 month ICM trend: 02/13/2022.    12-14 Month ICM trend:     Rosalene Billings, RN 02/15/2022 3:49 PM

## 2022-02-22 ENCOUNTER — Telehealth: Payer: Self-pay

## 2022-02-22 ENCOUNTER — Encounter: Payer: Self-pay | Admitting: Critical Care Medicine

## 2022-02-22 ENCOUNTER — Ambulatory Visit: Payer: 59 | Attending: Critical Care Medicine | Admitting: Critical Care Medicine

## 2022-02-22 VITALS — BP 110/62 | HR 82 | Ht 64.0 in | Wt 183.0 lb

## 2022-02-22 DIAGNOSIS — E875 Hyperkalemia: Secondary | ICD-10-CM | POA: Diagnosis not present

## 2022-02-22 DIAGNOSIS — K219 Gastro-esophageal reflux disease without esophagitis: Secondary | ICD-10-CM

## 2022-02-22 DIAGNOSIS — N189 Chronic kidney disease, unspecified: Secondary | ICD-10-CM | POA: Diagnosis not present

## 2022-02-22 DIAGNOSIS — I5020 Unspecified systolic (congestive) heart failure: Secondary | ICD-10-CM

## 2022-02-22 DIAGNOSIS — N1831 Chronic kidney disease, stage 3a: Secondary | ICD-10-CM

## 2022-02-22 DIAGNOSIS — R053 Chronic cough: Secondary | ICD-10-CM | POA: Diagnosis not present

## 2022-02-22 DIAGNOSIS — N179 Acute kidney failure, unspecified: Secondary | ICD-10-CM

## 2022-02-22 DIAGNOSIS — E669 Obesity, unspecified: Secondary | ICD-10-CM

## 2022-02-22 DIAGNOSIS — I6340 Cerebral infarction due to embolism of unspecified cerebral artery: Secondary | ICD-10-CM | POA: Diagnosis not present

## 2022-02-22 MED ORDER — CLOPIDOGREL BISULFATE 75 MG PO TABS: 75.0000 mg | ORAL_TABLET | Freq: Every day | ORAL | 3 refills | Status: DC

## 2022-02-22 MED ORDER — PANTOPRAZOLE SODIUM 40 MG PO TBEC: 40.0000 mg | DELAYED_RELEASE_TABLET | Freq: Every day | ORAL | 3 refills | Status: DC

## 2022-02-22 MED ORDER — ATORVASTATIN CALCIUM 80 MG PO TABS: 80.0000 mg | ORAL_TABLET | Freq: Every day | ORAL | 3 refills | Status: DC

## 2022-02-22 MED ORDER — SPIRONOLACTONE 25 MG PO TABS: 25.0000 mg | ORAL_TABLET | Freq: Every day | ORAL | 3 refills | Status: DC

## 2022-02-22 MED ORDER — ALBUTEROL SULFATE HFA 108 (90 BASE) MCG/ACT IN AERS: 2.0000 | INHALATION_SPRAY | Freq: Every evening | RESPIRATORY_TRACT | 3 refills | Status: DC | PRN

## 2022-02-22 MED ORDER — FUROSEMIDE 40 MG PO TABS: 40.0000 mg | ORAL_TABLET | Freq: Every day | ORAL | 3 refills | Status: DC

## 2022-02-22 MED ORDER — CETIRIZINE HCL 10 MG PO TABS: 10.0000 mg | ORAL_TABLET | Freq: Every day | ORAL | 3 refills | Status: AC

## 2022-02-22 MED ORDER — CARVEDILOL 3.125 MG PO TABS: 3.1250 mg | ORAL_TABLET | Freq: Two times a day (BID) | ORAL | 11 refills | Status: DC

## 2022-02-22 MED ORDER — FLUTICASONE PROPIONATE 50 MCG/ACT NA SUSP: 2.0000 | Freq: Every day | NASAL | 1 refills | Status: DC

## 2022-02-22 NOTE — Assessment & Plan Note (Signed)
Continue with proton pump inhibitor 

## 2022-02-22 NOTE — Assessment & Plan Note (Signed)
Obtained for patient scales and see if we can get her Meals on Wheels

## 2022-02-22 NOTE — Assessment & Plan Note (Signed)
No recurrence of stroke

## 2022-02-22 NOTE — Assessment & Plan Note (Signed)
Heart failure stable at this time she is on a lower dose of beta-blocker will continue all medications as prescribed by cardiology

## 2022-02-22 NOTE — Assessment & Plan Note (Signed)
Creatinine now less than 1 this has resolved

## 2022-02-22 NOTE — Telephone Encounter (Signed)
At the request of Dr Joya Gaskins, I met with the patient and her husband when they were in the clinic today.  She inquired about the wait list for [facility]. She said she put her name on the list in 09/2021 and is wondering where she is on the list.  I gave her the number for [employer] and instructed her to call and check. I am not sure if they will have any information to share with her.  I also completed a referral for [employer] for grocery delivery and faxed that to Jannet Mantis.  The patient said she has information about food pantries but does not have transportation to get there.  Dr Joya Gaskins requested a scale for her and Tammy Sours, LCSW/ [facility] dropped one off for the patient to take home.

## 2022-02-22 NOTE — Assessment & Plan Note (Signed)
Resolved creatinine back to baseline

## 2022-02-22 NOTE — Progress Notes (Signed)
Established Patient Office Visit  Subjective   Patient ID: [name], female    DOB: 04-30-1950  Age: 72 y.o. MRN: [id number]  Chief Complaint  Patient presents with   Follow-up    Med refill.  Already received flu vax this season.    Last seen 10/19/21 for AWV medicare This is a 72 year old female who has nonischemic cardiomyopathy mitral regurgitation ICD and pacemaker in place, reflux, previous cerebral infarction due to embolism, stage III kidney disease, hyperkalemia in the past, and obesity.  Patient required hospitalization in November since I saw her previously for a Medicare wellness visit in October.  Below is the discharge summary Hosp in November 2023   Discharge Diagnoses: Principal Problem:   Acute kidney injury superimposed on chronic kidney disease ([name]) Active Problems:   Heart failure with reduced ejection fraction ([place])- s/p ICD and pacemaker    GERD (gastroesophageal reflux disease)   History of CVA with residual deficit   Obesity (BMI 30-39.9)   Resolved Problems:   * No resolved hospital problems. *   Hospital Course:   72 year old female with past medical history of HFrEF, hypertension, CVA, dyslipidemia, obesity presented with hyperkalemia.  Patient is dose of furosemide was increased due to volume overload with increased potassium supplements.  She started having nausea vomiting for 2 days along with diarrhea.  As outpatient was found to have hyperkalemia and was referred to hospital for further evaluation. Patient was found to have potassium of 5.8   Assessment and Plan:   Acute kidney injury superimposed on CKD stage III a -Patient baseline creatinine was 1.28 on 11/09/2021 -Creatinine today improved to 1.80 -We will discontinue IV fluids   HFrEF s/p ICD/pacemaker -No clinical signs symptoms of decompensation -Started on gentle IV fluids as above -Diuretics on hold -Discussed with heart failure team, they have seen the patient and  recommend to hold diuretics, Entresto, Aldactone -Can restart taking Coreg 6.25 mg p.o. twice a day from 78/93/8101 if systolic blood pressure remains greater than 110     History of CVA with residual deficit -Continue Plavix, atorvastatin     Obesity -BMI 31.5     Following discharge she was seen by cardiology more recently as below [name] cardiology last note as below  Danielle primary Castro  [name] is a 72 y.o. female with chronic systolic CHF with nonischemic cardiomypathy (no significant CAD) s/p [name] CRT-D, LBBB, mild-mod MR and CVA (09/2013).   Coronary angiography in 7/15 showed no significant CAD.  Admitted in 9/15 with right sided facial droop and expressive aphasia, CVA on MRI. Had TEE showing EF 15%.     QRS narrowed, so in 11/16 device was reprogrammed to VVI.    Echo in 12/17 showed EF up to 45-50%.  Echo in 3/19 showed EF 45-50% with moderate LVH and normal RV.    Echo was done in 4/21 showing EF decreased to 30-35% with LAD territory wall motion abnormalities.  LHC/RHC in 5/21 showed normal filling pressures and cardiac output, no significant CAD.  With persistent LBBB, CRT was activated by [name] in 9/21.    Echo 3/23 showed EF 35-40%, diffuse hypokinesis, mildly decreased RV systolic function.    Follow up 11/09/21 volume overloaded & diuretics/GDMT adjusted. Follow up labs showed AKI and hyperkalemia, with SCr up to 5.32 and K 7.4. She had a 2-day history of N/V/D. Instructed to go to ED. She was admitted, GDMT held, given IVF and hyperkalemia corrected. AHF consulted to restart GDMT.  Coreg restarted, all other GDMT held. She was discharged home, weight 184 lbs.   Today she returns for HF follow up with her husband. We have slowly added back her GDMT over last couple of visits and she has tolerated this well. Overall feeling fine. Main complaint is leg cramps at night. She is not short of breath walking on flat ground with her cane. Denies palpitations,  abnormal bleeding, CP, dizziness, edema, or PND/Orthopnea. Appetite ok. No fever or chills. Weight at home 185 pounds. Taking all medications.    [name] device interrogation: OptiVol down but creeping up, decreased thoracic impedance, no AF/VT, >99% BiV pacing, < 1 hr day/activity.  1. Chronic Systolic Heart Failure: Nonischemic cardiomyopathy s/p [name] CRT-D in 10/15, coronary angiography without CAD in 07/2013. Echo done in 2/16, EF 25-30%.  Repeat echo in 8/16 with EF up to 35-40%.  Echo in 4/19 showed EF up to 45-50%.  However, echo in 4/21 showed EF back down to 30-35%.  LHC/RHC in 5/21 showed no CAD, normal filling pressures, preserved cardiac output.  Of note, [name] device had been reprogrammed to VVI as LBBB has been only intermittent => more recently, LBBB has been persistent and CRT was reactivated in 9/21. Echo 3/23 showed EF 35-40%, diffuse hypokinesis, mildly decreased RV systolic function.  Improved NYHA II-early III symptoms, she is not overloaded by exam but OptiVol slowly tending up. GDMT limited by recent AKI and hyperkalemia. - Continue Farxiga 10 mg daily. BMET and BNP today. - Continue Coreg 3.125 mg bid.  - Continue spironolactone 25 mg daily. - Continue Entresto 49/51 mg bid. - Continue Lasix 40 mg daily. Can take extra PRN. 2. CVA: ?Embolic.  No atrial fibrillation has been detected so far on device.  She will continue Plavix for now. If future device interrogation shows atrial fibrillation, she will need to be anticoagulated.   3. Hyperlipidemia: Goal LDL < 70 with history of CVA. Continue statin.  - Good lipids 3/23. 4. H/o Gastroenteritis: Resolved. Instructed her to hold GDMT if this recurs and call clinic for further medication instructions. 5. SDOH: Engage HFSW to discuss meals on wheels, transportation and information on DME for the home.   Follow up in 2 months with Dr. Aundra Castro, as scheduled.   [name] [place] FNP-BC 01/25/2022  Today the patient is  requesting a new scale and seeing if she can get Meals on Wheels.  She did have her RSV vaccine at [facility] 2 weeks ago.  Otherwise she is fully vaccinated.  She would like refills on medications that we are covering.  Her weight is stable at 183 pounds.  The patient just had complete lab screenings with resolution of renal failure creatinine now less than 1 and potassium now normal.      Review of Systems  Constitutional:  Negative for chills, diaphoresis, fever, malaise/fatigue and weight loss.  HENT:  Negative for congestion, hearing loss, nosebleeds, sore throat and tinnitus.   Eyes:  Negative for blurred vision, photophobia and redness.  Respiratory:  Positive for shortness of breath. Negative for cough, hemoptysis, sputum production, wheezing and stridor.   Cardiovascular:  Negative for chest pain, palpitations, orthopnea, claudication, leg swelling and PND.  Gastrointestinal:  Negative for abdominal pain, blood in stool, constipation, diarrhea, heartburn, nausea and vomiting.  Genitourinary:  Negative for dysuria, flank pain, frequency, hematuria and urgency.  Musculoskeletal:  Negative for back pain, falls, joint pain, myalgias and neck pain.  Skin:  Negative for itching and rash.  Neurological:  Negative for dizziness, tingling, tremors, sensory change, speech change, focal weakness, seizures, loss of consciousness, weakness and headaches.  Endo/Heme/Allergies:  Negative for environmental allergies and polydipsia. Does not bruise/bleed easily.  Psychiatric/Behavioral:  Negative for depression, memory loss, substance abuse and suicidal ideas. The patient is not nervous/anxious and does not have insomnia.       Objective:     BP 110/62 (BP Location: Left Arm, Patient Position: Sitting, Cuff Size: Large)   Pulse 82   Ht 5\' 4"  (1.626 m)   Wt 183 lb (83 kg)   SpO2 99%   BMI 31.41 kg/m    Physical Exam Vitals reviewed.  Constitutional:      Appearance: Normal appearance. She is  well-developed. She is obese. She is not diaphoretic.  HENT:     Head: Normocephalic and atraumatic.     Nose: No nasal deformity, septal deviation, mucosal edema or rhinorrhea.     Right Sinus: No maxillary sinus tenderness or frontal sinus tenderness.     Left Sinus: No maxillary sinus tenderness or frontal sinus tenderness.     Mouth/Throat:     Pharynx: No oropharyngeal exudate.  Eyes:     General: No scleral icterus.    Conjunctiva/sclera: Conjunctivae normal.     Pupils: Pupils are equal, round, and reactive to light.  Neck:     Thyroid: No thyromegaly.     Vascular: No carotid bruit or JVD.     Trachea: Trachea normal. No tracheal tenderness or tracheal deviation.  Cardiovascular:     Rate and Rhythm: Normal rate and regular rhythm.     Chest Wall: PMI is not displaced.     Pulses: Normal pulses. No decreased pulses.     Heart sounds: Normal heart sounds, S1 normal and S2 normal. Heart sounds not distant. No murmur heard.    No systolic murmur is present.     No diastolic murmur is present.     No friction rub. No gallop. No S3 or S4 sounds.  Pulmonary:     Effort: Pulmonary effort is normal. No tachypnea, accessory muscle usage or respiratory distress.     Breath sounds: No stridor. No decreased breath sounds, wheezing, rhonchi or rales.  Chest:     Chest wall: No tenderness.  Abdominal:     General: Bowel sounds are normal. There is no distension.     Palpations: Abdomen is soft. Abdomen is not rigid.     Tenderness: There is no abdominal tenderness. There is no guarding or rebound.  Musculoskeletal:        General: Normal range of motion.     Cervical back: Normal range of motion and neck supple. No edema, erythema or rigidity. No muscular tenderness. Normal range of motion.  Lymphadenopathy:     Head:     Right side of head: No submental or submandibular adenopathy.     Left side of head: No submental or submandibular adenopathy.     Cervical: No cervical  adenopathy.  Skin:    General: Skin is warm and dry.     Coloration: Skin is not pale.     Findings: No rash.     Nails: There is no clubbing.  Neurological:     Mental Status: She is alert and oriented to person, place, and time.     Sensory: No sensory deficit.  Psychiatric:        Speech: Speech normal.        Behavior: Behavior normal.  No results found for any visits on 02/22/22.    The ASCVD Risk score (Arnett DK, et al., 2019) failed to calculate for the following reasons:   The valid total cholesterol range is 130 to 320 mg/dL    Assessment & Plan:   Problem List Items Addressed This Visit       Cardiovascular and Mediastinum   Heart failure with reduced ejection fraction ([place])- s/p ICD and pacemaker  - Primary    Heart failure stable at this time she is on a lower dose of beta-blocker will continue all medications as prescribed by cardiology      Relevant Medications   atorvastatin (LIPITOR) 80 MG tablet   carvedilol (COREG) 3.125 MG tablet   furosemide (LASIX) 40 MG tablet   spironolactone (ALDACTONE) 25 MG tablet   Other Relevant Orders   For home use only DME Other see comment     Digestive   GERD (gastroesophageal reflux disease)    Continue with proton pump inhibitor      Relevant Medications   pantoprazole (PROTONIX) 40 MG tablet     Nervous and Auditory   Cerebral infarction due to embolism of cerebral artery (HCC)    No recurrence of stroke      Relevant Medications   atorvastatin (LIPITOR) 80 MG tablet   clopidogrel (PLAVIX) 75 MG tablet     Genitourinary   RESOLVED: Stage 3a chronic kidney disease (HCC)    Creatinine now less than 1 this has resolved      RESOLVED: Acute kidney injury superimposed on chronic kidney disease ([place])    Resolved creatinine back to baseline        Other   Obesity (BMI 30-39.9)    Obtained for patient scales and see if we can get her Meals on Wheels      RESOLVED: Hyperkalemia    Resolved       Other Visit Diagnoses     Chronic cough       Relevant Medications   albuterol (VENTOLIN HFA) 108 (90 Base) MCG/ACT inhaler     Multiple systems assessed 35 minutes spent  Return in about 4 months (around 06/23/2022) for htn, heart failure.    Asencion Noble, MD

## 2022-02-22 NOTE — Patient Instructions (Signed)
All medication refills sent to [facility]  No medication changes  A weight scale will be obtained  Will check on meals on wheels  Return Dr Joya Gaskins 4 months

## 2022-02-22 NOTE — Assessment & Plan Note (Signed)
Resolved

## 2022-02-24 NOTE — Progress Notes (Signed)
Remote ICD transmission.   

## 2022-02-24 NOTE — Addendum Note (Signed)
Addended by: Cheri Kearns A on: 02/24/2022 11:04 AM   Modules accepted: Level of Service

## 2022-03-06 ENCOUNTER — Ambulatory Visit (INDEPENDENT_AMBULATORY_CARE_PROVIDER_SITE_OTHER): Payer: 59

## 2022-03-06 DIAGNOSIS — I428 Other cardiomyopathies: Secondary | ICD-10-CM

## 2022-03-07 LAB — CUP PACEART REMOTE DEVICE CHECK
Battery Remaining Longevity: 2 mo
Battery Voltage: 2.82 V
Brady Statistic AP VP Percent: 0.01 %
Brady Statistic AP VS Percent: 0 %
Brady Statistic AS VP Percent: 98.36 %
Brady Statistic AS VS Percent: 1.63 %
Brady Statistic RA Percent Paced: 0.01 %
Brady Statistic RV Percent Paced: 2.52 %
Date Time Interrogation Session: 20240219181709
HighPow Impedance: 87 Ohm
Implantable Lead Connection Status: 753985
Implantable Lead Connection Status: 753985
Implantable Lead Connection Status: 753985
Implantable Lead Implant Date: 20151021
Implantable Lead Implant Date: 20151021
Implantable Lead Implant Date: 20151021
Implantable Lead Location: 753858
Implantable Lead Location: 753859
Implantable Lead Location: 753860
Implantable Lead Model: 4298
Implantable Lead Model: 5076
Implantable Pulse Generator Implant Date: 20151021
Lead Channel Impedance Value: 1178 Ohm
Lead Channel Impedance Value: 1197 Ohm
Lead Channel Impedance Value: 1235 Ohm
Lead Channel Impedance Value: 1292 Ohm
Lead Channel Impedance Value: 1311 Ohm
Lead Channel Impedance Value: 513 Ohm
Lead Channel Impedance Value: 532 Ohm
Lead Channel Impedance Value: 551 Ohm
Lead Channel Impedance Value: 589 Ohm
Lead Channel Impedance Value: 722 Ohm
Lead Channel Impedance Value: 760 Ohm
Lead Channel Impedance Value: 817 Ohm
Lead Channel Impedance Value: 874 Ohm
Lead Channel Pacing Threshold Amplitude: 0.5 V
Lead Channel Pacing Threshold Amplitude: 0.75 V
Lead Channel Pacing Threshold Amplitude: 1.125 V
Lead Channel Pacing Threshold Pulse Width: 0.4 ms
Lead Channel Pacing Threshold Pulse Width: 0.4 ms
Lead Channel Pacing Threshold Pulse Width: 0.5 ms
Lead Channel Sensing Intrinsic Amplitude: 4.125 mV
Lead Channel Sensing Intrinsic Amplitude: 4.125 mV
Lead Channel Sensing Intrinsic Amplitude: 8.75 mV
Lead Channel Sensing Intrinsic Amplitude: 8.75 mV
Lead Channel Setting Pacing Amplitude: 1.25 V
Lead Channel Setting Pacing Amplitude: 1.5 V
Lead Channel Setting Pacing Amplitude: 2 V
Lead Channel Setting Pacing Pulse Width: 0.4 ms
Lead Channel Setting Pacing Pulse Width: 1 ms
Lead Channel Setting Sensing Sensitivity: 0.3 mV
Zone Setting Status: 755011

## 2022-03-20 ENCOUNTER — Ambulatory Visit: Payer: 59 | Attending: Internal Medicine

## 2022-03-20 DIAGNOSIS — Z95810 Presence of automatic (implantable) cardiac defibrillator: Secondary | ICD-10-CM | POA: Diagnosis not present

## 2022-03-20 DIAGNOSIS — I5022 Chronic systolic (congestive) heart failure: Secondary | ICD-10-CM

## 2022-03-22 ENCOUNTER — Encounter (HOSPITAL_COMMUNITY): Payer: Self-pay | Admitting: Cardiology

## 2022-03-22 ENCOUNTER — Telehealth (HOSPITAL_COMMUNITY): Payer: Self-pay | Admitting: Cardiology

## 2022-03-22 ENCOUNTER — Ambulatory Visit (HOSPITAL_COMMUNITY)
Admission: RE | Admit: 2022-03-22 | Discharge: 2022-03-22 | Disposition: A | Discharge status: Home / Self Care | Payer: 59 | Source: Ambulatory Visit | Attending: Cardiology | Admitting: Cardiology

## 2022-03-22 VITALS — BP 108/60 | HR 82 | Wt 185.4 lb

## 2022-03-22 DIAGNOSIS — I5022 Chronic systolic (congestive) heart failure: Secondary | ICD-10-CM | POA: Diagnosis not present

## 2022-03-22 DIAGNOSIS — I447 Left bundle-branch block, unspecified: Secondary | ICD-10-CM | POA: Diagnosis not present

## 2022-03-22 DIAGNOSIS — I340 Nonrheumatic mitral (valve) insufficiency: Secondary | ICD-10-CM | POA: Insufficient documentation

## 2022-03-22 DIAGNOSIS — Z8673 Personal history of transient ischemic attack (TIA), and cerebral infarction without residual deficits: Secondary | ICD-10-CM | POA: Insufficient documentation

## 2022-03-22 DIAGNOSIS — Z79899 Other long term (current) drug therapy: Secondary | ICD-10-CM | POA: Diagnosis not present

## 2022-03-22 DIAGNOSIS — E785 Hyperlipidemia, unspecified: Secondary | ICD-10-CM | POA: Diagnosis not present

## 2022-03-22 DIAGNOSIS — Z95810 Presence of automatic (implantable) cardiac defibrillator: Secondary | ICD-10-CM | POA: Insufficient documentation

## 2022-03-22 DIAGNOSIS — I428 Other cardiomyopathies: Secondary | ICD-10-CM | POA: Insufficient documentation

## 2022-03-22 DIAGNOSIS — Z8249 Family history of ischemic heart disease and other diseases of the circulatory system: Secondary | ICD-10-CM | POA: Diagnosis present

## 2022-03-22 LAB — LIPID PANEL
Cholesterol: 123 mg/dL (ref 0–200)
HDL: 31 mg/dL — ABNORMAL LOW (ref >40)
LDL Cholesterol: 66 mg/dL (ref 0–99)
Total CHOL/HDL Ratio: 4 RATIO
Triglycerides: 128 mg/dL (ref <150)
VLDL: 26 mg/dL (ref 0–40)

## 2022-03-22 LAB — BASIC METABOLIC PANEL
Anion gap: 11 (ref 5–15)
BUN: 11 mg/dL (ref 8–23)
CO2: 25 mmol/L (ref 22–32)
Calcium: 8.6 mg/dL — ABNORMAL LOW (ref 8.9–10.3)
Chloride: 104 mmol/L (ref 98–111)
Creatinine, Ser: 0.97 mg/dL (ref 0.44–1.00)
GFR, Estimated: >60 mL/min (ref >60)
Glucose, Bld: 96 mg/dL (ref 70–99)
Potassium: 2.3 mmol/L — CL (ref 3.5–5.1)
Sodium: 140 mmol/L (ref 135–145)

## 2022-03-22 LAB — BRAIN NATRIURETIC PEPTIDE: B Natriuretic Peptide: 38.9 pg/mL (ref 0.0–100.0)

## 2022-03-22 MED ORDER — CARVEDILOL 6.25 MG PO TABS: 6.2500 mg | ORAL_TABLET | Freq: Two times a day (BID) | ORAL | 3 refills | Status: DC

## 2022-03-22 MED ORDER — POTASSIUM CHLORIDE CRYS ER 20 MEQ PO TBCR: 40.0000 meq | EXTENDED_RELEASE_TABLET | Freq: Every day | ORAL | 3 refills | Status: DC

## 2022-03-22 NOTE — Progress Notes (Signed)
Patient ID: [name], female   DOB: [birth year], 72 y.o.   MRN: [id number]     PCP: [name], MD Neurologist: Dr Leonie Man EP: Dr Caryl Comes.  HF Cardiologist: Dr Aundra Dubin   HPI: [name] is a 72 y.o. female with chronic systolic CHF with nonischemic cardiomypathy (no significant CAD) s/p Medtronic CRT-D, LBBB, mild-mod MR and CVA (09/2013).  Coronary angiography in 7/15 showed no significant CAD.  Admitted in 9/15 with right sided facial droop and expressive aphasia, CVA on MRI. Had TEE showing EF 15%.    QRS narrowed, so in 11/16 device was reprogrammed to VVI.   Echo in 12/17 showed EF up to 45-50%.  Echo in 3/19 showed EF 45-50% with moderate LVH and normal RV.   Echo was done in 4/21 showing EF decreased to 30-35% with LAD territory wall motion abnormalities.  LHC/RHC in 5/21 showed normal filling pressures and cardiac output, no significant CAD.  With persistent LBBB, CRT was activated by Dr. Caryl Comes in 9/21.   Echo 3/23 showed EF 35-40%, diffuse hypokinesis, mildly decreased RV systolic function.   Today she returns for HF follow up with her husband.  Her son was in an accident (hit by a car) and is in the ICU. She has been under a lot of stress. She has been doing a lot of walking to get into the hospital to see her son.  No dyspnea walking on flat ground.  No chest pain.  No lightheadedness.  No orthopnea/PND.      Medtronic device interrogation: stable thoracic impedance, no AF/VT, >99% BiV pacing  Labs (2/21): K 3.8, creatinine 0.96, LDL 105  Labs (9/21): LDL 92 Labs (12/21): K 3.7, creatinine 0.87 Labs (4/22): LDL 66 Labs (8/22): K 3.9, creatinine 1.12 Labs (1/23): K 4, creatinine 1.14 Labs (3/23): LDL 71, HDL 34 Labs (6/23): K 5.0, creatinine 1.61 Labs (10/23): K 4.4, creatinine 1.96 Labs (1/24): K 3.7, creatinine 0.99  ECG (personally reviewed): NSR, BiV paced  PMH: 1. Nonischemic cardiomyopathy: Echo (7/15) with severe LV dilation, EF 25% with diffuse  hypokinesis, mild to moderate MR.  RHC/ LHC 08/08/13 with RA 4, PA 23/8, PCWP 11, CI 2.52; no CAD.  TEE (9/15) with EF 15-20%, severe global hypokinesis, no LV thrombus, moderate MR, normal RV.  Echo (10/2013): EF 25-30%, grade I DD, mild AI. Medtronic CRT-D (10/15).  Echo (2/16) with EF 25-30%, diffuse hypokinesis worse in the septum, grade I diastolic dysfunction, normal RV size and systolic function, mild-moderate MR.  - Echo (8/16) with EF 35-40%, diffuse hypokinesis, mild-moderate mitral regurgitation.  - Echo (12/17): EF 45-50%, mild LVH, mild to moderate MR.  - Echo (3/19): EF 45-50% with moderate LVH, mild MR, normal RV size and systolic function.  - Echo (4/21): EF 30-35% with LAD territory wall motion abnormalities. - LHC/RHC (5/21): No significant CAD; mean RA 6, PA 31/13, mean PCWP 13, CI 3.37.  - Echo (3/23): EF 35-40%, diffuse hypokinesis, mildly decreased RV systolic function. 2. CVA: 9/15 with dysarthria.  Carotids 9/15 with no significant CAD.  - Carotid dopplers (10/17): mild plaque, normal subclavians.  3. LBBB: Medtronic CRT-D (10/2013) 4. Hyperlipidemia 5. Cholelithiasis 6. Peripheral arterial dopplers (6/19): Normal  SH: lives with her son and husband. Does not drink or smoke. Currently not working.   FH: Mom had A fib and Heart Failure. Deceased 52        Sister has lupus  ROS: All systems negative except as listed in HPI, PMH and Problem  List.   Current Outpatient Medications  Medication Sig Dispense Refill   albuterol (VENTOLIN HFA) 108 (90 Base) MCG/ACT inhaler Inhale 2 puffs into the lungs at bedtime as needed for wheezing or shortness of breath. 18 g 3   atorvastatin (LIPITOR) 80 MG tablet Take 1 tablet (80 mg total) by mouth daily at 6 PM. 90 tablet 3   cetirizine (ZYRTEC) 10 MG tablet Take 1 tablet (10 mg total) by mouth daily. 60 tablet 3   clopidogrel (PLAVIX) 75 MG tablet Take 1 tablet (75 mg total) by mouth daily. 90 tablet 3   dapagliflozin propanediol  (FARXIGA) 10 MG TABS tablet Take 1 tablet (10 mg total) by mouth daily before breakfast. 30 tablet 11   fluticasone (FLONASE) 50 MCG/ACT nasal spray Place 2 sprays into both nostrils daily. 16 g 1   furosemide (LASIX) 40 MG tablet Take 1 tablet (40 mg total) by mouth daily. 90 tablet 3   pantoprazole (PROTONIX) 40 MG tablet Take 1 tablet (40 mg total) by mouth daily. 60 tablet 3   sacubitril-valsartan (ENTRESTO) 49-51 MG Take 1 tablet by mouth 2 (two) times daily. 180 tablet 3   spironolactone (ALDACTONE) 25 MG tablet Take 1 tablet (25 mg total) by mouth daily. 90 tablet 3   carvedilol (COREG) 6.25 MG tablet Take 1 tablet (6.25 mg total) by mouth 2 (two) times daily with a meal. 180 tablet 3   potassium chloride SA (KLOR-CON M) 20 MEQ tablet Take 2 tablets (40 mEq total) by mouth daily. 60 tablet 3   No current facility-administered medications for this encounter.   BP 108/60   Pulse 82   Wt 84.1 kg (185 lb 6.4 oz)   SpO2 98%   BMI 31.82 kg/m   Wt Readings from Last 3 Encounters:  03/22/22 84.1 kg (185 lb 6.4 oz)  02/22/22 83 kg (183 lb)  01/25/22 84.5 kg (186 lb 3.2 oz)   PHYSICAL EXAM: General: NAD Neck: No JVD, no thyromegaly or thyroid nodule.  Lungs: Clear to auscultation bilaterally with normal respiratory effort. CV: Nondisplaced PMI.  Heart regular S1/S2, no S3/S4, no murmur.  No peripheral edema.  No carotid bruit.  Normal pedal pulses.  Abdomen: Soft, nontender, no hepatosplenomegaly, no distention.  Skin: Intact without lesions or rashes.  Neurologic: Alert and oriented x 3.  Psych: Normal affect. Extremities: No clubbing or cyanosis.  HEENT: Normal.   ASSESSMENT & PLAN: 1. Chronic Systolic Heart Failure: Nonischemic cardiomyopathy s/p Medtronic CRT-D in 10/15, coronary angiography without CAD in 07/2013. Echo done in 2/16, EF 25-30%.  Repeat echo in 8/16 with EF up to 35-40%.  Echo in 4/19 showed EF up to 45-50%.  However, echo in 4/21 showed EF back down to 30-35%.   LHC/RHC in 5/21 showed no CAD, normal filling pressures, preserved cardiac output.  Of note, Medtronic device had been reprogrammed to VVI as LBBB has been only intermittent => more recently, LBBB has been persistent and CRT was reactivated in 9/21. Echo 3/23 showed EF 35-40%, diffuse hypokinesis, mildly decreased RV systolic function.  NYHA class II symptoms.  Not volume overloaded by exam or Optivol. Weight down 1 lb.  - Increase Coreg to 6.25 mg bid.  - Continue Entresto 49/51 bid. BMET/BNP today.     - Continue Lasix 40 mg daily.  - Will likely need to restart K, check BMET today as above.   - Continue spironolactone 25 mg daily.  - Continue dapagliflozin 10 mg daily.   2. CVA: ?Embolic.  No atrial fibrillation has been detected so far on device.  She will continue Plavix for now. If future device interrogation shows atrial fibrillation, she will need to be anticoagulated.   3. Hyperlipidemia: Goal LDL < 70 with history of CVA.  Continue statin.  - Check lipids today.   Follow up in 3 months with APP  Loralie Champagne  03/22/2022

## 2022-03-22 NOTE — Telephone Encounter (Signed)
She cannot manage this with diet.  She will be at risk for dangerous arrhythmias.  She must take the potassium as I ordered or I will no longer fill her medications as this would be dangerous.  We can give her powder potassium if she would prefer.  There are no other options if she wishes to continue her current cardiac medication regimen which has helped her.

## 2022-03-22 NOTE — Progress Notes (Signed)
EPIC Encounter for ICM Monitoring  Patient Name: [name] is a 72 y.o. female Date: 03/22/2022 Primary Care Physican: [name], MD Primary Cardiologist: Aundra Dubin Electrophysiologist: Vergie Living Pacing: 96%         11/14/2021 Weight: 181 lbs 12/28/2021 Office weight: 181 lbs 03/22/2022 Office Weight: 185 lbs   Battery Longevity: 2 months Next battery check 04/04/2022                                                          Transmission reviewed.   Optivol thoracic impedance suggesting normal fluid levels 1/27-1-29 but was suggesting possible fluid accumulation 1/8-1/26.  Unable to maintain normal fluid levels since 04/2021.   Prescribed:  Furosemide 40 mg take 1 tablet by mouth daily Potassium 20 mEq take 2 tablets by mouth daily (per 3/6 HF clinic OV note, patient has not been compliant with taking Potassium)   Labs: 03/22/2022 Creatinine 0.97, BUN 11, Potassium 2.3, Sodium 140, GFR >60 (low potassium has been addressed by Dr Aundra Dubin on 3/6) 01/30/2022 Creatinine 0.99, BUN 12, Potassium 3.7, Sodium 140  01/25/2022 Creatinine 0.99, BUN 11, Potassium 2.7, Sodium 139  A complete set of results can be found in Results Review.   Recommendations:   Recommendations given at 3/6 OV with Dr Aundra Dubin     Follow-up plan: ICM clinic phone appointment on 04/24/2022.   91 day device clinic remote transmission 04/04/2022.   EP/Cardiology Office Visits:  04/25/2022 with HF clinic.   04/28/2022 with Dr Caryl Comes.   Copy of ICM check sent to Dr. Caryl Comes.  3 month ICM trend: 03/20/2022.    12-14 Month ICM trend:     Rosalene Billings, RN 03/22/2022 4:34 PM

## 2022-03-22 NOTE — Patient Instructions (Signed)
INCREASE Carvedilol to 6.25 mg Twice daily  Labs done today, your results will be available in MyChart, we will contact you for abnormal readings.  Your physician has requested that you have an echocardiogram. Echocardiography is a painless test that uses sound waves to create images of your heart. It provides your doctor with information about the size and shape of your heart and how well your heart's chambers and valves are working. This procedure takes approximately one hour. There are no restrictions for this procedure. Please do NOT wear cologne, perfume, aftershave, or lotions (deodorant is allowed). Please arrive 15 minutes prior to your appointment time.  Your physician recommends that you schedule a follow-up appointment in: 3 months  If you have any questions or concerns before your next appointment please send [place] a message through [employer] or call our office at [phone].    TO LEAVE A MESSAGE FOR THE NURSE SELECT OPTION 2, PLEASE LEAVE A MESSAGE INCLUDING: YOUR NAME DATE OF BIRTH CALL BACK NUMBER REASON FOR CALL**this is important as we prioritize the call backs  YOU WILL RECEIVE A CALL BACK THE SAME DAY AS LONG AS YOU CALL BEFORE 4:00 PM  At the [facility], you and your health needs are our priority. As part of our continuing mission to provide you with exceptional heart care, we have created designated Provider Care Teams. These Care Teams include your primary Cardiologist (physician) and Advanced Practice Providers (APPs- Physician Assistants and Nurse Practitioners) who all work together to provide you with the care you need, when you need it.   You may see any of the following providers on your designated Care Team at your next follow up: Dr Glori Bickers Dr Loralie Champagne Dr. Roxana Hires, NP Lyda Jester, [place] [facility] [place], [place] Forestine Na, NP Audry Riles, PharmD   Please be sure to bring in all your  medications bottles to every appointment.    Thank you for choosing [facility]

## 2022-03-22 NOTE — Telephone Encounter (Signed)
[facility] CALLED WITH CRITICAL REPORT K 2.3

## 2022-03-22 NOTE — Telephone Encounter (Signed)
Results verbally given to Dr [name]  Per VO advised pt to take potassium daily Increase daily dose by 40 meq, with an additional 80 meq today Repeat bmet x 1 week   Pt has not been taking potassium  Declined in the past and would like to manage with diet  Please advise

## 2022-03-22 NOTE — Telephone Encounter (Signed)
Pt aware via husband Will restart potassium 40 meq daily With a total of 80 meq today  Repeat bmet x 1 week

## 2022-03-27 ENCOUNTER — Ambulatory Visit: Payer: Self-pay | Admitting: *Deleted

## 2022-03-27 NOTE — Telephone Encounter (Signed)
Message from Roslynn Amble sent at 03/27/2022  9:54 AM EDT  Summary: Cough/congestion   The patient called in stating she has had a deep dry cough for a few days now. She is also complaining of chest congestion. She has used her albuterol inhaler which has helped some. Please assist patient further.          Call History   Type Contact Phone/Fax User  03/27/2022 09:50 AM EDT Phone (Incoming) Castro, Danielle Jeschke (Self) [phone] Jerilynn Mages) Roslynn Amble   Reason for Disposition  [1] Continuous (nonstop) coughing interferes with work or school AND [2] no improvement using cough treatment per Care Advice  Answer Assessment - Initial Assessment Questions 1. ONSET: "When did the cough begin?"      Friday afternoon 2. SEVERITY: "How bad is the cough today?"      Coughing a lot  Non productive cough.    Coughing a lot while on the phone with me. 3. SPUTUM: "Describe the color of your sputum" (none, dry cough; clear, white, yellow, green)     Nothing coming up 4. HEMOPTYSIS: "Are you coughing up any blood?" If so ask: "How much?" (flecks, streaks, tablespoons, etc.)     Not asked 5. DIFFICULTY BREATHING: "Are you having difficulty breathing?" If Yes, ask: "How bad is it?" (e.g., mild, moderate, severe)    - MILD: No SOB at rest, mild SOB with walking, speaks normally in sentences, can lie down, no retractions, pulse < 100.    - MODERATE: SOB at rest, SOB with minimal exertion and prefers to sit, cannot lie down flat, speaks in phrases, mild retractions, audible wheezing, pulse 100-120.    - SEVERE: Very SOB at rest, speaks in single words, struggling to breathe, sitting hunched forward, retractions, pulse > 120      Moderate I have to sleep sitting up so I don't cough constantly.    I don't normally have to do that.  6. FEVER: "Do you have a fever?" If Yes, ask: "What is your temperature, how was it measured, and when did it start?"     No fver.   No sore throat.     I think it's allergies.    I  feel rattling in my chest when I cough. 7. CARDIAC HISTORY: "Do you have any history of heart disease?" (e.g., heart attack, congestive heart failure)      I am a heart pt.    I have CHF 8. LUNG HISTORY: "Do you have any history of lung disease?"  (e.g., pulmonary embolus, asthma, emphysema)     No 9. PE RISK FACTORS: "Do you have a history of blood clots?" (or: recent major surgery, recent prolonged travel, bedridden)     No 10. OTHER SYMPTOMS: "Do you have any other symptoms?" (e.g., runny nose, wheezing, chest pain)       No sore throat.    A little ear congestion, feels rattling in her chest when coughing. There are no appts. With [facility].    I was giving her information regarding the Mobile Unit and its location and time.    She suddenly said,   "I don't feel like getting out".     I let her know she needed to be evaluated for her cough and she hung up on me.   I'll keep using my albuterol and she hung up 11. PREGNANCY: "Is there any chance you are pregnant?" "When was your last menstrual period?"  N/A due to age 72. TRAVEL: "Have you traveled out of the country in the last month?" (e.g., travel history, exposures)       Not asked  Protocols used: Cough - Acute Non-Productive-A-AH

## 2022-03-27 NOTE — Telephone Encounter (Signed)
  Chief Complaint: non productive cough, feels rattling in her chest when she coughs.   Coughing frequently while on phone with me. Symptoms: Frequent coughing, non productive Frequency: Since Friday afternoon Pertinent Negatives: Patient denies Fever or sore throat. Disposition: [] ED /[] Urgent Care (no appt availability in office) / [] Appointment(In office/virtual)/ []  [place] [facility]/ [] Home Care/ [] Refused Recommended Disposition /[x]  Mobile Bus/ []  Follow-up with PCP Additional Notes: No appts. Available with [facility].    I was giving her information for the [facility].   She was not familiar with it.   She is in the [place] area so I let her know it was at [facility] [place]. Until 4:30 today.   I let her know she needed to  be evaluated and she said,    "I don't feel like getting out".  I again mentioned she needed to be evaluated and she said,   "I'll just keep using my albuterol inhaler to break it up" and hung up.    I sent a message to [facility].

## 2022-03-27 NOTE — Telephone Encounter (Signed)
Spoke with patient patient coughing up clear phlegm and feels ratting in chest . Requesting appointment . Appointment for tomorrow with PCP. Patient refusing to got to ED prefers seeing PCP.

## 2022-03-28 ENCOUNTER — Ambulatory Visit: Payer: 59 | Attending: Critical Care Medicine | Admitting: Critical Care Medicine

## 2022-03-28 ENCOUNTER — Encounter: Payer: Self-pay | Admitting: Critical Care Medicine

## 2022-03-28 VITALS — BP 90/60 | HR 63 | Temp 98.3°F | Ht 64.0 in | Wt 186.6 lb

## 2022-03-28 DIAGNOSIS — R053 Chronic cough: Secondary | ICD-10-CM | POA: Diagnosis not present

## 2022-03-28 DIAGNOSIS — I428 Other cardiomyopathies: Secondary | ICD-10-CM

## 2022-03-28 DIAGNOSIS — J209 Acute bronchitis, unspecified: Secondary | ICD-10-CM | POA: Insufficient documentation

## 2022-03-28 MED ORDER — DOXYCYCLINE HYCLATE 100 MG PO TABS: 100.0000 mg | ORAL_TABLET | Freq: Two times a day (BID) | ORAL | 0 refills | Status: AC

## 2022-03-28 MED ORDER — BENZONATATE 100 MG PO CAPS: 100.0000 mg | ORAL_CAPSULE | Freq: Three times a day (TID) | ORAL | 0 refills | Status: DC | PRN

## 2022-03-28 MED ORDER — ALBUTEROL SULFATE HFA 108 (90 BASE) MCG/ACT IN AERS: 2.0000 | INHALATION_SPRAY | Freq: Every evening | RESPIRATORY_TRACT | 3 refills | Status: DC | PRN

## 2022-03-28 MED ORDER — PREDNISONE 10 MG PO TABS: ORAL_TABLET | ORAL | 0 refills | Status: DC

## 2022-03-28 NOTE — Assessment & Plan Note (Signed)
As per cardiology

## 2022-03-28 NOTE — Progress Notes (Signed)
Established Patient Office Visit  Subjective   Patient ID: [name], female    DOB: [birth year]  Age: 72 y.o. MRN: [id number]  Chief Complaint  Patient presents with   Cough    Last seen 10/19/21 for AWV medicare This is a 72 year old female who has nonischemic cardiomyopathy mitral regurgitation ICD and pacemaker in place, reflux, previous cerebral infarction due to embolism, stage III kidney disease, hyperkalemia in the past, and obesity.  Patient required hospitalization in November since I saw her previously for a Medicare wellness visit in October.  Below is the discharge summary Hosp in November 2023   Discharge Diagnoses: Principal Problem:   Acute kidney injury superimposed on chronic kidney disease ([place]) Active Problems:   Heart failure with reduced ejection fraction ([place])- s/p ICD and pacemaker    GERD (gastroesophageal reflux disease)   History of CVA with residual deficit   Obesity (BMI 30-39.9)   Resolved Problems:   * No resolved hospital problems. *   Hospital Course:   72 year old female with past medical history of HFrEF, hypertension, CVA, dyslipidemia, obesity presented with hyperkalemia.  Patient is dose of furosemide was increased due to volume overload with increased potassium supplements.  She started having nausea vomiting for 2 days along with diarrhea.  As outpatient was found to have hyperkalemia and was referred to hospital for further evaluation. Patient was found to have potassium of 5.8   Assessment and Plan:   Acute kidney injury superimposed on CKD stage III a -Patient baseline creatinine was 1.28 on 11/09/2021 -Creatinine today improved to 1.80 -We will discontinue IV fluids   HFrEF s/p ICD/pacemaker -No clinical signs symptoms of decompensation -Started on gentle IV fluids as above -Diuretics on hold -Discussed with heart failure team, they have seen the patient and recommend to hold diuretics, Entresto, Aldactone -Can restart  taking Coreg 6.25 mg p.o. twice a day from [id number] if systolic blood pressure remains greater than 110     History of CVA with residual deficit -Continue Plavix, atorvastatin     Obesity -BMI 31.5     Following discharge she was seen by cardiology more recently as below [facility] cardiology last note as below  [place] primary cards  [name] is a 72 y.o. female with chronic systolic CHF with nonischemic cardiomypathy (no significant CAD) s/p Medtronic CRT-D, LBBB, mild-mod MR and CVA (09/2013).   Coronary angiography in 7/15 showed no significant CAD.  Admitted in 9/15 with right sided facial droop and expressive aphasia, CVA on MRI. Had TEE showing EF 15%.     QRS narrowed, so in 11/16 device was reprogrammed to VVI.    Echo in 12/17 showed EF up to 45-50%.  Echo in 3/19 showed EF 45-50% with moderate LVH and normal RV.    Echo was done in 4/21 showing EF decreased to 30-35% with LAD territory wall motion abnormalities.  LHC/RHC in 5/21 showed normal filling pressures and cardiac output, no significant CAD.  With persistent LBBB, CRT was activated by Dr. Caryl Comes in 9/21.    Echo 3/23 showed EF 35-40%, diffuse hypokinesis, mildly decreased RV systolic function.    Follow up 11/09/21 volume overloaded & diuretics/GDMT adjusted. Follow up labs showed AKI and hyperkalemia, with SCr up to 5.32 and K 7.4. She had a 2-day history of N/V/D. Instructed to go to ED. She was admitted, GDMT held, given IVF and hyperkalemia corrected. AHF consulted to restart GDMT. Coreg restarted, all other GDMT held. She was discharged home, weight 184  lbs.   Today she returns for HF follow up with her husband. We have slowly added back her GDMT over last couple of visits and she has tolerated this well. Overall feeling fine. Main complaint is leg cramps at night. She is not short of breath walking on flat ground with her cane. Denies palpitations, abnormal bleeding, CP, dizziness, edema, or PND/Orthopnea. Appetite  ok. No fever or chills. Weight at home 185 pounds. Taking all medications.    Medtronic device interrogation: OptiVol down but creeping up, decreased thoracic impedance, no AF/VT, >99% BiV pacing, < 1 hr day/activity.  1. Chronic Systolic Heart Failure: Nonischemic cardiomyopathy s/p Medtronic CRT-D in 10/15, coronary angiography without CAD in 07/2013. Echo done in 2/16, EF 25-30%.  Repeat echo in 8/16 with EF up to 35-40%.  Echo in 4/19 showed EF up to 45-50%.  However, echo in 4/21 showed EF back down to 30-35%.  LHC/RHC in 5/21 showed no CAD, normal filling pressures, preserved cardiac output.  Of note, Medtronic device had been reprogrammed to VVI as LBBB has been only intermittent => more recently, LBBB has been persistent and CRT was reactivated in 9/21. Echo 3/23 showed EF 35-40%, diffuse hypokinesis, mildly decreased RV systolic function.  Improved NYHA II-early III symptoms, she is not overloaded by exam but OptiVol slowly tending up. GDMT limited by recent AKI and hyperkalemia. - Continue Farxiga 10 mg daily. BMET and BNP today. - Continue Coreg 3.125 mg bid.  - Continue spironolactone 25 mg daily. - Continue Entresto 49/51 mg bid. - Continue Lasix 40 mg daily. Can take extra PRN. 2. CVA: ?Embolic.  No atrial fibrillation has been detected so far on device.  She will continue Plavix for now. If future device interrogation shows atrial fibrillation, she will need to be anticoagulated.   3. Hyperlipidemia: Goal LDL < 70 with history of CVA. Continue statin.  - Good lipids 3/23. 4. H/o Gastroenteritis: Resolved. Instructed her to hold GDMT if this recurs and call clinic for further medication instructions. 5. SDOH: Engage HFSW to discuss meals on wheels, transportation and information on DME for the home.   Follow up in 2 months with Dr. Aundra Dubin, as scheduled.   [name] [place] FNP-BC 01/25/2022  Today the patient is requesting a new scale and seeing if she can get Meals on Wheels.  She  did have her RSV vaccine at [facility] 2 weeks ago.  Otherwise she is fully vaccinated.  She would like refills on medications that we are covering.  Her weight is stable at 183 pounds.  The patient just had complete lab screenings with resolution of renal failure creatinine now less than 1 and potassium now normal.  03/28/22 Patient presents with acute onset of off going on for 3 days she coughs to the point where she will have emesis.  She denies heartburn no shortness of breath or chest pain.  She was just seen by cardiology as documented  Acute ov coughing Cards 03/22/22 ASSESSMENT & PLAN: 1. Chronic Systolic Heart Failure: Nonischemic cardiomyopathy s/p Medtronic CRT-D in 10/15, coronary angiography without CAD in 07/2013. Echo done in 2/16, EF 25-30%.  Repeat echo in 8/16 with EF up to 35-40%.  Echo in 4/19 showed EF up to 45-50%.  However, echo in 4/21 showed EF back down to 30-35%.  LHC/RHC in 5/21 showed no CAD, normal filling pressures, preserved cardiac output.  Of note, Medtronic device had been reprogrammed to VVI as LBBB has been only intermittent => more recently, LBBB has  been persistent and CRT was reactivated in 9/21. Echo 3/23 showed EF 35-40%, diffuse hypokinesis, mildly decreased RV systolic function.  NYHA class II symptoms.  Not volume overloaded by exam or Optivol. Weight down 1 lb.  - Increase Coreg to 6.25 mg bid.  - Continue Entresto 49/51 bid. BMET/BNP today.     - Continue Lasix 40 mg daily.  - Will likely need to restart K, check BMET today as above.   - Continue spironolactone 25 mg daily.  - Continue dapagliflozin 10 mg daily.   2. CVA: ?Embolic.  No atrial fibrillation has been detected so far on device.  She will continue Plavix for now. If future device interrogation shows atrial fibrillation, she will need to be anticoagulated.   3. Hyperlipidemia: Goal LDL < 70 with history of CVA.  Continue statin.  - Check lipids today.        Review of Systems   Constitutional:  Negative for chills, diaphoresis, fever, malaise/fatigue and weight loss.  HENT:  Positive for congestion and ear discharge. Negative for ear pain, hearing loss, nosebleeds, sinus pain, sore throat and tinnitus.   Eyes:  Negative for blurred vision, photophobia and redness.  Respiratory:  Positive for cough and wheezing. Negative for hemoptysis, sputum production, shortness of breath and stridor.   Cardiovascular:  Negative for chest pain, palpitations, orthopnea, claudication, leg swelling and PND.  Gastrointestinal:  Negative for abdominal pain, blood in stool, constipation, diarrhea, heartburn, nausea and vomiting.  Genitourinary:  Negative for dysuria, flank pain, frequency, hematuria and urgency.  Musculoskeletal:  Negative for back pain, falls, joint pain, myalgias and neck pain.  Skin:  Negative for itching and rash.  Neurological:  Negative for dizziness, tingling, tremors, sensory change, speech change, focal weakness, seizures, loss of consciousness, weakness and headaches.  Endo/Heme/Allergies:  Negative for environmental allergies and polydipsia. Does not bruise/bleed easily.  Psychiatric/Behavioral:  Negative for depression, memory loss, substance abuse and suicidal ideas. The patient is not nervous/anxious and does not have insomnia.       Objective:     BP 90/60   Pulse 63   Temp 98.3 F (36.8 C)   Ht '5\' 4"'$  (1.626 m)   Wt 186 lb 9.6 oz (84.6 kg)   SpO2 98%   BMI 32.03 kg/m    Physical Exam Vitals reviewed.  Constitutional:      Appearance: Normal appearance. She is well-developed. She is obese. She is not diaphoretic.  HENT:     Head: Normocephalic and atraumatic.     Right Ear: Tympanic membrane normal.     Left Ear: Tympanic membrane normal.     Nose: Nose normal. No nasal deformity, septal deviation, mucosal edema, congestion or rhinorrhea.     Right Sinus: No maxillary sinus tenderness or frontal sinus tenderness.     Left Sinus: No  maxillary sinus tenderness or frontal sinus tenderness.     Mouth/Throat:     Mouth: Mucous membranes are moist.     Pharynx: Oropharynx is clear. No oropharyngeal exudate or posterior oropharyngeal erythema.  Eyes:     General: No scleral icterus.    Conjunctiva/sclera: Conjunctivae normal.     Pupils: Pupils are equal, round, and reactive to light.  Neck:     Thyroid: No thyromegaly.     Vascular: No carotid bruit or JVD.     Trachea: Trachea normal. No tracheal tenderness or tracheal deviation.  Cardiovascular:     Rate and Rhythm: Normal rate and regular rhythm.  Chest Wall: PMI is not displaced.     Pulses: Normal pulses. No decreased pulses.     Heart sounds: Normal heart sounds, S1 normal and S2 normal. Heart sounds not distant. No murmur heard.    No systolic murmur is present.     No diastolic murmur is present.     No friction rub. No gallop. No S3 or S4 sounds.  Pulmonary:     Effort: Pulmonary effort is normal. No tachypnea, accessory muscle usage or respiratory distress.     Breath sounds: No stridor. No decreased breath sounds, wheezing, rhonchi or rales.  Chest:     Chest wall: No tenderness.  Abdominal:     General: Bowel sounds are normal. There is no distension.     Palpations: Abdomen is soft. Abdomen is not rigid.     Tenderness: There is no abdominal tenderness. There is no guarding or rebound.  Musculoskeletal:        General: Normal range of motion.     Cervical back: Normal range of motion and neck supple. No edema, erythema or rigidity. No muscular tenderness. Normal range of motion.  Lymphadenopathy:     Head:     Right side of head: No submental or submandibular adenopathy.     Left side of head: No submental or submandibular adenopathy.     Cervical: No cervical adenopathy.  Skin:    General: Skin is warm and dry.     Coloration: Skin is not pale.     Findings: No rash.     Nails: There is no clubbing.  Neurological:     Mental Status: She is  alert and oriented to person, place, and time.     Sensory: No sensory deficit.  Psychiatric:        Speech: Speech normal.        Behavior: Behavior normal.      No results found for any visits on 03/28/22.    The ASCVD Risk score (Arnett DK, et al., 2019) failed to calculate for the following reasons:   The patient has a prior MI or stroke diagnosis    Assessment & Plan:   Problem List Items Addressed This Visit       Cardiovascular and Mediastinum   Non-ischemic cardiomyopathy ([place]) (Chronic)    As per cardiology        Respiratory   Acute bronchitis - Primary    Acute tracheobronchitis plan to give pulsed prednisone course and short course antibiotic and benzonatate.   follow-up back      Relevant Orders   DG Chest 2 View   Other Visit Diagnoses     Chronic cough       Relevant Medications   albuterol (VENTOLIN HFA) 108 (90 Base) MCG/ACT inhaler     Multiple systems assessed 35 minutes spent  Return in about 2 weeks (around 04/11/2022) for bronchitis.    Asencion Noble, MD

## 2022-03-28 NOTE — Assessment & Plan Note (Signed)
Acute tracheobronchitis plan to give pulsed prednisone course and short course antibiotic and benzonatate.   follow-up back

## 2022-03-28 NOTE — Patient Instructions (Signed)
Go to the [facility] [address] radiology location for a chest x-ray today  Take benzonatate 3 times daily as needed for cough  Take prednisone 4 tablets daily for 3 days then stop  Take doxycycline 1 twice daily for 5 days and stop  Albuterol inhaler was refilled  Return to Dr. Joya Gaskins 2 weeks

## 2022-03-30 ENCOUNTER — Ambulatory Visit (HOSPITAL_COMMUNITY)
Admission: RE | Admit: 2022-03-30 | Discharge: 2022-03-30 | Disposition: A | Discharge status: Home / Self Care | Payer: 59 | Source: Ambulatory Visit | Attending: Critical Care Medicine | Admitting: Critical Care Medicine

## 2022-03-30 DIAGNOSIS — R059 Cough, unspecified: Secondary | ICD-10-CM | POA: Diagnosis not present

## 2022-03-30 DIAGNOSIS — J40 Bronchitis, not specified as acute or chronic: Secondary | ICD-10-CM | POA: Insufficient documentation

## 2022-03-30 DIAGNOSIS — I5022 Chronic systolic (congestive) heart failure: Secondary | ICD-10-CM | POA: Insufficient documentation

## 2022-03-30 DIAGNOSIS — Z95810 Presence of automatic (implantable) cardiac defibrillator: Secondary | ICD-10-CM | POA: Insufficient documentation

## 2022-03-30 DIAGNOSIS — J209 Acute bronchitis, unspecified: Secondary | ICD-10-CM

## 2022-03-30 LAB — BASIC METABOLIC PANEL
Anion gap: 11 (ref 5–15)
BUN: 10 mg/dL (ref 8–23)
CO2: 23 mmol/L (ref 22–32)
Calcium: 9.2 mg/dL (ref 8.9–10.3)
Chloride: 106 mmol/L (ref 98–111)
Creatinine, Ser: 0.95 mg/dL (ref 0.44–1.00)
GFR, Estimated: >60 mL/min (ref >60)
Glucose, Bld: 99 mg/dL (ref 70–99)
Potassium: 3.7 mmol/L (ref 3.5–5.1)
Sodium: 140 mmol/L (ref 135–145)

## 2022-03-31 ENCOUNTER — Telehealth: Payer: Self-pay

## 2022-03-31 NOTE — Telephone Encounter (Signed)
-----   Message from [name], MD sent at 03/30/2022  1:12 PM EDT ----- Let pt know potassium is normal. No medication changes

## 2022-03-31 NOTE — Telephone Encounter (Signed)
Pt was called and vm was left, Information has been sent to nurse pool.   

## 2022-04-03 ENCOUNTER — Telehealth: Payer: Self-pay

## 2022-04-03 NOTE — Progress Notes (Signed)
Let pt know chest xray normal  no fluid cancer or pneumonia

## 2022-04-03 NOTE — Telephone Encounter (Signed)
Pt was called and vm was left, Information has been sent to nurse pool.   

## 2022-04-03 NOTE — Telephone Encounter (Signed)
-----   Message from [name], MD sent at 04/03/2022 10:16 AM EDT ----- Let pt know chest xray normal  no fluid cancer or pneumonia

## 2022-04-04 ENCOUNTER — Ambulatory Visit (INDEPENDENT_AMBULATORY_CARE_PROVIDER_SITE_OTHER): Payer: 59

## 2022-04-04 DIAGNOSIS — I428 Other cardiomyopathies: Secondary | ICD-10-CM | POA: Diagnosis not present

## 2022-04-05 LAB — CUP PACEART REMOTE DEVICE CHECK
Battery Remaining Longevity: 4 mo
Battery Voltage: 2.82 V
Brady Statistic AP VP Percent: 0 %
Brady Statistic AP VS Percent: 0 %
Brady Statistic AS VP Percent: 98.25 %
Brady Statistic AS VS Percent: 1.74 %
Brady Statistic RA Percent Paced: 0.01 %
Brady Statistic RV Percent Paced: 1 %
Date Time Interrogation Session: 20240319072202
HighPow Impedance: 84 Ohm
Implantable Lead Connection Status: 753985
Implantable Lead Connection Status: 753985
Implantable Lead Connection Status: 753985
Implantable Lead Implant Date: 20151021
Implantable Lead Implant Date: 20151021
Implantable Lead Implant Date: 20151021
Implantable Lead Location: 753858
Implantable Lead Location: 753859
Implantable Lead Location: 753860
Implantable Lead Model: 4298
Implantable Lead Model: 5076
Implantable Pulse Generator Implant Date: 20151021
Lead Channel Impedance Value: 1026 Ohm
Lead Channel Impedance Value: 1026 Ohm
Lead Channel Impedance Value: 1121 Ohm
Lead Channel Impedance Value: 418 Ohm
Lead Channel Impedance Value: 456 Ohm
Lead Channel Impedance Value: 513 Ohm
Lead Channel Impedance Value: 532 Ohm
Lead Channel Impedance Value: 589 Ohm
Lead Channel Impedance Value: 589 Ohm
Lead Channel Impedance Value: 703 Ohm
Lead Channel Impedance Value: 779 Ohm
Lead Channel Impedance Value: 950 Ohm
Lead Channel Impedance Value: 988 Ohm
Lead Channel Pacing Threshold Amplitude: 0.375 V
Lead Channel Pacing Threshold Amplitude: 0.5 V
Lead Channel Pacing Threshold Amplitude: 1.125 V
Lead Channel Pacing Threshold Pulse Width: 0.4 ms
Lead Channel Pacing Threshold Pulse Width: 0.4 ms
Lead Channel Pacing Threshold Pulse Width: 0.5 ms
Lead Channel Sensing Intrinsic Amplitude: 2.125 mV
Lead Channel Sensing Intrinsic Amplitude: 2.125 mV
Lead Channel Sensing Intrinsic Amplitude: 4.25 mV
Lead Channel Sensing Intrinsic Amplitude: 4.25 mV
Lead Channel Setting Pacing Amplitude: 1.25 V
Lead Channel Setting Pacing Amplitude: 1.5 V
Lead Channel Setting Pacing Amplitude: 2 V
Lead Channel Setting Pacing Pulse Width: 0.4 ms
Lead Channel Setting Pacing Pulse Width: 1 ms
Lead Channel Setting Sensing Sensitivity: 0.3 mV
Zone Setting Status: 755011

## 2022-04-11 ENCOUNTER — Ambulatory Visit: Payer: 59 | Admitting: Critical Care Medicine

## 2022-04-11 ENCOUNTER — Ambulatory Visit (HOSPITAL_COMMUNITY)
Admission: RE | Admit: 2022-04-11 | Discharge: 2022-04-11 | Disposition: A | Discharge status: Home / Self Care | Payer: 59 | Source: Ambulatory Visit | Attending: Cardiology | Admitting: Cardiology

## 2022-04-11 DIAGNOSIS — Z8673 Personal history of transient ischemic attack (TIA), and cerebral infarction without residual deficits: Secondary | ICD-10-CM | POA: Diagnosis not present

## 2022-04-11 DIAGNOSIS — E785 Hyperlipidemia, unspecified: Secondary | ICD-10-CM | POA: Insufficient documentation

## 2022-04-11 DIAGNOSIS — I5022 Chronic systolic (congestive) heart failure: Secondary | ICD-10-CM | POA: Diagnosis not present

## 2022-04-11 DIAGNOSIS — I447 Left bundle-branch block, unspecified: Secondary | ICD-10-CM | POA: Insufficient documentation

## 2022-04-11 DIAGNOSIS — I4891 Unspecified atrial fibrillation: Secondary | ICD-10-CM | POA: Insufficient documentation

## 2022-04-11 DIAGNOSIS — Z95810 Presence of automatic (implantable) cardiac defibrillator: Secondary | ICD-10-CM | POA: Diagnosis not present

## 2022-04-11 DIAGNOSIS — K219 Gastro-esophageal reflux disease without esophagitis: Secondary | ICD-10-CM | POA: Diagnosis not present

## 2022-04-11 DIAGNOSIS — I340 Nonrheumatic mitral (valve) insufficiency: Secondary | ICD-10-CM | POA: Diagnosis not present

## 2022-04-11 LAB — ECHOCARDIOGRAM COMPLETE
Area-P 1/2: 2.42 cm2
Calc EF: 41.2 %
S' Lateral: 2.7 cm
Single Plane A2C EF: 45.4 %
Single Plane A4C EF: 40.7 %

## 2022-04-11 NOTE — Progress Notes (Signed)
Echocardiogram 2D Echocardiogram has been performed.  Fidel Levy 04/11/2022, 9:51 AM

## 2022-04-19 NOTE — Addendum Note (Signed)
Addended by: Douglass Rivers D on: 04/19/2022 03:13 PM   Modules accepted: Level of Service

## 2022-04-19 NOTE — Progress Notes (Signed)
Remote ICD transmission.   

## 2022-04-24 ENCOUNTER — Ambulatory Visit: Payer: 59 | Attending: Internal Medicine

## 2022-04-24 DIAGNOSIS — Z95810 Presence of automatic (implantable) cardiac defibrillator: Secondary | ICD-10-CM

## 2022-04-24 DIAGNOSIS — I5022 Chronic systolic (congestive) heart failure: Secondary | ICD-10-CM

## 2022-04-25 ENCOUNTER — Ambulatory Visit (HOSPITAL_COMMUNITY)
Admission: RE | Admit: 2022-04-25 | Discharge: 2022-04-25 | Disposition: A | Discharge status: Home / Self Care | Payer: 59 | Source: Ambulatory Visit | Attending: Family Medicine | Admitting: Family Medicine

## 2022-04-25 ENCOUNTER — Encounter (HOSPITAL_COMMUNITY): Payer: Self-pay

## 2022-04-25 VITALS — BP 90/60 | HR 63 | Wt 183.0 lb

## 2022-04-25 DIAGNOSIS — Z7902 Long term (current) use of antithrombotics/antiplatelets: Secondary | ICD-10-CM | POA: Insufficient documentation

## 2022-04-25 DIAGNOSIS — Z8673 Personal history of transient ischemic attack (TIA), and cerebral infarction without residual deficits: Secondary | ICD-10-CM | POA: Insufficient documentation

## 2022-04-25 DIAGNOSIS — I428 Other cardiomyopathies: Secondary | ICD-10-CM | POA: Insufficient documentation

## 2022-04-25 DIAGNOSIS — I6340 Cerebral infarction due to embolism of unspecified cerebral artery: Secondary | ICD-10-CM | POA: Diagnosis not present

## 2022-04-25 DIAGNOSIS — E785 Hyperlipidemia, unspecified: Secondary | ICD-10-CM

## 2022-04-25 DIAGNOSIS — I5022 Chronic systolic (congestive) heart failure: Secondary | ICD-10-CM

## 2022-04-25 DIAGNOSIS — Z95810 Presence of automatic (implantable) cardiac defibrillator: Secondary | ICD-10-CM | POA: Insufficient documentation

## 2022-04-25 NOTE — Patient Instructions (Signed)
No Labs done today.   No other medication changes were made. Please continue all current medications as prescribed.  Your physician recommends that you schedule a follow-up appointment in: 3 months with our NP/PA Clinic here in our office.   If you have any questions or concerns before your next appointment please send [place] a message through [place] or call our office at [phone].    TO LEAVE A MESSAGE FOR THE NURSE SELECT OPTION 2, PLEASE LEAVE A MESSAGE INCLUDING: YOUR NAME DATE OF BIRTH CALL BACK NUMBER REASON FOR CALL**this is important as we prioritize the call backs  YOU WILL RECEIVE A CALL BACK THE SAME DAY AS LONG AS YOU CALL BEFORE 4:00 PM   Do the following things EVERYDAY: Weigh yourself in the morning before breakfast. Write it down and keep it in a log. Take your medicines as prescribed Eat low salt foods--Limit salt (sodium) to 2000 mg per day.  Stay as active as you can everyday Limit all fluids for the day to less than 2 liters   At the [facility], you and your health needs are our priority. As part of our continuing mission to provide you with exceptional heart care, we have created designated Provider Care Teams. These Care Teams include your primary Cardiologist (physician) and Advanced Practice Providers (APPs- Physician Assistants and Nurse Practitioners) who all work together to provide you with the care you need, when you need it.   You may see any of the following providers on your designated Care Team at your next follow up: Dr Arvilla Meres Dr Carron Curie, NP Robbie Lis, [place] Karle Plumber, PharmD   Please be sure to bring in all your medications bottles to every appointment.

## 2022-04-25 NOTE — Progress Notes (Signed)
Patient ID: YANIKA CHANNEL, female   DOB: 1950/01/22, 72 y.o.   MRN: [id number]     PCP: Storm Frisk, MD Neurologist: Dr Pearlean Brownie EP: Dr Graciela Husbands.  HF Cardiologist: Dr Shirlee Latch   HPI: Ms. Molter is a 72 y.o. female with chronic systolic CHF with nonischemic cardiomypathy (no significant CAD) s/p Medtronic CRT-D, LBBB, mild-mod MR and CVA (09/2013).  Coronary angiography in 7/15 showed no significant CAD.  Admitted in 9/15 with right sided facial droop and expressive aphasia, CVA on MRI. Had TEE showing EF 15%.    QRS narrowed, so in 11/16 device was reprogrammed to VVI.   Echo in 12/17 showed EF up to 45-50%.  Echo in 3/19 showed EF 45-50% with moderate LVH and normal RV.   Echo was done in 4/21 showing EF decreased to 30-35% with LAD territory wall motion abnormalities.  LHC/RHC in 5/21 showed normal filling pressures and cardiac output, no significant CAD.  With persistent LBBB, CRT was activated by Dr. Graciela Husbands in 9/21.   Echo 3/23 showed EF 35-40%, diffuse hypokinesis, mildly decreased RV systolic function.   Echo 3/24 showed EF 35-40%, global hypokinesis, mildly reduced RV systolic function.  Today she returns for HF follow up with her husband. Overall feeling fine. In better spirits as her son is coming home from CIR this week after being hit by a car 2 months ago and critically injured. She remains short of breath walking further distances on flat ground, able to get around the house and complete ADLS with no issue. Denies palpitations, abnormal bleeding, CP, dizziness, edema, or PND/Orthopnea. Appetite ok. No fever or chills. Weight at home 177 pounds. Taking all medications, compliant with her KCL regimen.  Medtronic device interrogation: stable thoracic impedance but OptiVol creeping up, no AF/VT, >98.3% BiV pacing, 1 hr/day activity  Labs (2/21): K 3.8, creatinine 0.96, LDL 105  Labs (9/21): LDL 92 Labs (12/21): K 3.7, creatinine 0.87 Labs (4/22): LDL 66 Labs (8/22): K 3.9,  creatinine 1.12 Labs (1/23): K 4, creatinine 1.14 Labs (3/23): LDL 71, HDL 34 Labs (6/23): K 5.0, creatinine 1.61 Labs (10/23): K 4.4, creatinine 1.96 Labs (1/24): K 3.7, creatinine 0.99 Labs (3/24): K 3.7, creatinine 0.95, LDL 66  ECG (personally reviewed): none ordered today  PMH: 1. Nonischemic cardiomyopathy: Echo (7/15) with severe LV dilation, EF 25% with diffuse hypokinesis, mild to moderate MR.  RHC/ LHC 08/08/13 with RA 4, PA 23/8, PCWP 11, CI 2.52; no CAD.  TEE (9/15) with EF 15-20%, severe global hypokinesis, no LV thrombus, moderate MR, normal RV.  Echo (10/2013): EF 25-30%, grade I DD, mild AI. Medtronic CRT-D (10/15).  Echo (2/16) with EF 25-30%, diffuse hypokinesis worse in the septum, grade I diastolic dysfunction, normal RV size and systolic function, mild-moderate MR.  - Echo (8/16) with EF 35-40%, diffuse hypokinesis, mild-moderate mitral regurgitation.  - Echo (12/17): EF 45-50%, mild LVH, mild to moderate MR.  - Echo (3/19): EF 45-50% with moderate LVH, mild MR, normal RV size and systolic function.  - Echo (4/21): EF 30-35% with LAD territory wall motion abnormalities. - LHC/RHC (5/21): No significant CAD; mean RA 6, PA 31/13, mean PCWP 13, CI 3.37.  - Echo (3/23): EF 35-40%, diffuse hypokinesis, mildly decreased RV systolic function. - Echo (3/24): EF 35-40%, grade I DD, RV mildly reduced 2. CVA: 9/15 with dysarthria.  Carotids 9/15 with no significant CAD.  - Carotid dopplers (10/17): mild plaque, normal subclavians.  3. LBBB: Medtronic CRT-D (10/2013) 4. Hyperlipidemia 5. Cholelithiasis 6.  Peripheral arterial dopplers (6/19): Normal  SH: lives with her son and husband. Does not drink or smoke. Currently not working.   FH: Mom had A fib and Heart Failure. Deceased 54        Sister has lupus  ROS: All systems negative except as listed in HPI, PMH and Problem List.  Current Outpatient Medications  Medication Sig Dispense Refill   albuterol (VENTOLIN HFA) 108  (90 Base) MCG/ACT inhaler Inhale 2 puffs into the lungs at bedtime as needed for wheezing or shortness of breath. 18 g 3   atorvastatin (LIPITOR) 80 MG tablet Take 1 tablet (80 mg total) by mouth daily at 6 PM. 90 tablet 3   benzonatate (TESSALON) 100 MG capsule Take 1 capsule (100 mg total) by mouth 3 (three) times daily as needed for cough. 30 capsule 0   carvedilol (COREG) 6.25 MG tablet Take 1 tablet (6.25 mg total) by mouth 2 (two) times daily with a meal. 180 tablet 3   cetirizine (ZYRTEC) 10 MG tablet Take 1 tablet (10 mg total) by mouth daily. 60 tablet 3   clopidogrel (PLAVIX) 75 MG tablet Take 1 tablet (75 mg total) by mouth daily. 90 tablet 3   dapagliflozin propanediol (FARXIGA) 10 MG TABS tablet Take 1 tablet (10 mg total) by mouth daily before breakfast. 30 tablet 11   fluticasone (FLONASE) 50 MCG/ACT nasal spray Place 2 sprays into both nostrils daily. 16 g 1   furosemide (LASIX) 40 MG tablet Take 1 tablet (40 mg total) by mouth daily. 90 tablet 3   pantoprazole (PROTONIX) 40 MG tablet Take 1 tablet (40 mg total) by mouth daily. 60 tablet 3   potassium chloride (KLOR-CON M) 10 MEQ tablet Take 40 mEq by mouth daily.     sacubitril-valsartan (ENTRESTO) 49-51 MG Take 1 tablet by mouth 2 (two) times daily. 180 tablet 3   spironolactone (ALDACTONE) 25 MG tablet Take 1 tablet (25 mg total) by mouth daily. 90 tablet 3   No current facility-administered medications for this encounter.   BP 90/60   Pulse 63   Wt 83 kg (183 lb)   SpO2 99%   BMI 31.41 kg/m   Wt Readings from Last 3 Encounters:  04/25/22 83 kg (183 lb)  03/28/22 84.6 kg (186 lb 9.6 oz)  03/22/22 84.1 kg (185 lb 6.4 oz)   PHYSICAL EXAM: General:  NAD. No resp difficulty, walked into clinic with her cane HEENT: Normal Neck: Supple. No JVD. Carotids 2+ bilat; no bruits. No lymphadenopathy or thryomegaly appreciated. Cor: PMI nondisplaced. Regular rate & rhythm. No rubs, gallops or murmurs. Lungs: Clear Abdomen:  Soft, nontender, nondistended. No hepatosplenomegaly. No bruits or masses. Good bowel sounds. Extremities: No cyanosis, clubbing, rash, edema Neuro: Alert & oriented x 3, cranial nerves grossly intact. Moves all 4 extremities w/o difficulty. Affect pleasant.  ASSESSMENT & PLAN: 1. Chronic Systolic Heart Failure: Nonischemic cardiomyopathy s/p Medtronic CRT-D in 10/15, coronary angiography without CAD in 07/2013. Echo done in 2/16, EF 25-30%.  Repeat echo in 8/16 with EF up to 35-40%.  Echo in 4/19 showed EF up to 45-50%.  However, echo in 4/21 showed EF back down to 30-35%.  LHC/RHC in 5/21 showed no CAD, normal filling pressures, preserved cardiac output.  Of note, Medtronic device had been reprogrammed to VVI as LBBB has been only intermittent => more recently, LBBB has been persistent and CRT was reactivated in 9/21. Echo 3/23 showed EF 35-40%, diffuse hypokinesis, mildly decreased RV systolic function.  Echo (3/24) showed EF 35-40%. NYHA class II symptoms.  Not volume overloaded by exam or Optivol. Weight down 2 lbs.  - Continue Coreg 6.25 mg bid.  - Continue Entresto 49/51 bid. Recent labs stable    - Continue Lasix 40 mg daily + 40 KCL daily.  - Continue spironolactone 25 mg daily.  - Continue dapagliflozin 10 mg daily.  No GU symptoms 2. CVA: ?Embolic.  No atrial fibrillation has been detected so far on device.  She will continue Plavix for now. If future device interrogation shows atrial fibrillation, she will need to be anticoagulated.   3. Hyperlipidemia: Goal LDL < 70 with history of CVA.  Good lipids 3/24. - Continue statin.   Follow up in 3 months with APP.  Anderson MaltaJessica M [facility] FNP-BC 04/25/2022

## 2022-04-26 NOTE — Progress Notes (Signed)
EPIC Encounter for ICM Monitoring  Patient Name: [name] is a 72 y.o. female Date: 04/26/2022 Primary Care Physican: Storm Frisk, MD Primary Cardiologist: Shirlee Latch Electrophysiologist: Joycelyn Schmid Pacing: 97.4%         11/14/2021 Weight: 181 lbs 12/28/2021 Office weight: 181 lbs 03/22/2022 Office Weight: 185 lbs   Battery Longevity: 4 months                                                          Transmission reviewed.   Optivol thoracic impedance normal but was suggesting possible fluid accumulation from 3/4-3/17.     Prescribed:  Furosemide 40 mg take 1 tablet by mouth daily Potassium 10 mEq take 4 tablets by mouth daily    Labs: 03/30/2022 Creatinine 0.95, BUN 10, Potassium 3.7, Sodium 140, GFR >60 03/22/2022 Creatinine 0.97, BUN 11, Potassium 2.3, Sodium 140, GFR >60 (low potassium has been addressed by Dr Shirlee Latch on 3/6) 01/30/2022 Creatinine 0.99, BUN 12, Potassium 3.7, Sodium 140  01/25/2022 Creatinine 0.99, BUN 11, Potassium 2.7, Sodium 139  A complete set of results can be found in Results Review.   Recommendations:   Recommendations given at 4/9 OV with Prince Rome, NP at [facility] clinic     Follow-up plan: ICM clinic phone appointment on 05/29/2022.   91 day device clinic remote transmission 07/17/2022.   EP/Cardiology Office Visits: 07/25/2022 with HF clinic.   04/28/2022 with Dr Graciela Husbands.   Copy of ICM check sent to Dr. Graciela Husbands.  3 month ICM trend: 04/25/2022.    12-14 Month ICM trend:     Karie Soda, RN 04/26/2022 4:38 PM

## 2022-04-28 ENCOUNTER — Encounter: Payer: Self-pay | Admitting: Internal Medicine

## 2022-04-28 ENCOUNTER — Ambulatory Visit: Payer: 59 | Attending: Internal Medicine | Admitting: Internal Medicine

## 2022-04-28 VITALS — BP 108/66 | HR 66 | Ht 64.0 in | Wt 182.8 lb

## 2022-04-28 DIAGNOSIS — I5022 Chronic systolic (congestive) heart failure: Secondary | ICD-10-CM | POA: Diagnosis not present

## 2022-04-28 DIAGNOSIS — Z95810 Presence of automatic (implantable) cardiac defibrillator: Secondary | ICD-10-CM | POA: Diagnosis not present

## 2022-04-28 DIAGNOSIS — I428 Other cardiomyopathies: Secondary | ICD-10-CM

## 2022-04-28 NOTE — Progress Notes (Signed)
Patient Care Team: Storm Frisk, MD as PCP - General (Pulmonary Disease) [name], MD as PCP - Advanced Heart Failure (Cardiology)   HPI  [name] is a 72 y.o. female seen in followup for Medtronic CRT-D 2015 for NICM; intermittent LBBB and CHF subsequent delayed activation of her LV lead with the caveats as noted below, i.e. diaphragmatic stimulation at most vectors and inability despite multiple reprogramming's to flip either lead I or lead V1 but alas...[name](See Below)   Walking the halls of the hospital for the last couple of months, her son was hit in a crosswalk is going home today.  His name is Richard.  No nocturnal dyspnea orthopnea or palpitations  She is S/p CVA without interval detection of atrial fibrillation.       DATE TEST EF   7/15 LHC  No Angiographic CAD  7 /15 Echo  20-25 %   12/17 Echo 45-50 %   3/19 Echo  45-50%   4/21 Echo  30-35%   5/21 LHC  No obstructive CAD  3/23 Echo   35-40%   3/24 Echo  35-40%     Date Cr K Hgb  4/18 0.8 3.4   10/19  0.89 4.3   6/20 0.83 3.7   6/21 0.96 3.5 10.2  11/23 0.85<<5.32 3.7<<7.4 10.0  3/24 0.95 3.7         Past Medical History:  Diagnosis Date   A-fib    Acute kidney injury superimposed on chronic kidney disease 11/21/2021   Arthritis    Automatic implantable cardioverter-defibrillator in situ    Cardiac resynchronization therapy defibrillator (CRT-D) in place    a. placed on 11/05/13.   GERD (gastroesophageal reflux disease)    HLD (hyperlipidemia)    Hyperkalemia 11/21/2021   Hypotension    LBBB (left bundle branch block)    Mitral regurgitation    a. Mild-mod MR by echo 07/2013.   PONV (postoperative nausea and vomiting)    Stage 3a chronic kidney disease 02/22/2021   Stroke 09/23/2013   Systolic CHF    a. Dx 07/2013 - due to NICM. Cath normal cors. EF 25%.    Past Surgical History:  Procedure Laterality Date   BI-VENTRICULAR IMPLANTABLE CARDIOVERTER DEFIBRILLATOR N/A  11/05/2013   Procedure: BI-VENTRICULAR IMPLANTABLE CARDIOVERTER DEFIBRILLATOR  (CRT-D);  Surgeon: Duke Salvia, MD;  Location: [facility] CATH LAB;  Service: Cardiovascular;  Laterality: N/A;   BI-VENTRICULAR IMPLANTABLE CARDIOVERTER DEFIBRILLATOR  (CRT-D)  11/05/2013   CARDIAC CATHETERIZATION  07/2013   KNEE ARTHROSCOPY Right    LEFT AND RIGHT HEART CATHETERIZATION WITH CORONARY ANGIOGRAM N/A 08/08/2013   Procedure: LEFT AND RIGHT HEART CATHETERIZATION WITH CORONARY ANGIOGRAM;  Surgeon: [name], MD;  Location: [facility] CATH LAB;  Service: Cardiovascular;  Laterality: N/A;   RIGHT/LEFT HEART CATH AND CORONARY ANGIOGRAPHY N/A 06/05/2019   Procedure: RIGHT/LEFT HEART CATH AND CORONARY ANGIOGRAPHY;  Surgeon: [name], MD;  Location: [facility] INVASIVE CV LAB;  Service: Cardiovascular;  Laterality: N/A;   TEE WITHOUT CARDIOVERSION N/A 09/26/2013   Procedure: TRANSESOPHAGEAL ECHOCARDIOGRAM (TEE);  Surgeon: [name], MD;  Location: [facility] ENDOSCOPY;  Service: Cardiovascular;  Laterality: N/A;   TONSILLECTOMY     WRIST ARTHROSCOPY  12/21/2010   Procedure: ARTHROSCOPY WRIST;  Surgeon: [name], MD;  Location: [facility];  Service: Orthopedics;  Laterality: Right;  right wrist, repair TFCC on right, open reconstruction ECU sheath    Current Outpatient Medications  Medication Sig  Dispense Refill   albuterol (VENTOLIN HFA) 108 (90 Base) MCG/ACT inhaler Inhale 2 puffs into the lungs at bedtime as needed for wheezing or shortness of breath. 18 g 3   atorvastatin (LIPITOR) 80 MG tablet Take 1 tablet (80 mg total) by mouth daily at 6 PM. 90 tablet 3   carvedilol (COREG) 6.25 MG tablet Take 1 tablet (6.25 mg total) by mouth 2 (two) times daily with a meal. 180 tablet 3   cetirizine (ZYRTEC) 10 MG tablet Take 1 tablet (10 mg total) by mouth daily. 60 tablet 3   clopidogrel (PLAVIX) 75 MG tablet Take 1 tablet (75 mg total) by mouth daily. 90 tablet 3   dapagliflozin propanediol (FARXIGA) 10  MG TABS tablet Take 1 tablet (10 mg total) by mouth daily before breakfast. 30 tablet 11   fluticasone (FLONASE) 50 MCG/ACT nasal spray Place 2 sprays into both nostrils daily. 16 g 1   furosemide (LASIX) 40 MG tablet Take 1 tablet (40 mg total) by mouth daily. 90 tablet 3   pantoprazole (PROTONIX) 40 MG tablet Take 1 tablet (40 mg total) by mouth daily. 60 tablet 3   potassium chloride (KLOR-CON M) 10 MEQ tablet Take 40 mEq by mouth daily.     sacubitril-valsartan (ENTRESTO) 49-51 MG Take 1 tablet by mouth 2 (two) times daily. 180 tablet 3   spironolactone (ALDACTONE) 25 MG tablet Take 1 tablet (25 mg total) by mouth daily. 90 tablet 3   No current facility-administered medications for this visit.    Allergies  Allergen Reactions   Voltaren [Diclofenac] Rash    Review of Systems negative except from HPI and PMH  Physical Exam BP 108/66   Pulse 66   Ht 5\' 4"  (1.626 m)   Wt 182 lb 12.8 oz (82.9 kg)   SpO2 99%   BMI 31.38 kg/m  Well developed and well nourished in no acute distress HENT normal Neck supple with JVP-flat Clear Device pocket well healed; without hematoma or erythema.  There is no tethering  Regular rate and rhythm, no  gallop No / murmur Abd-soft with activ No Clubbing cyanosis  edema Skin-warm and dry A & Oriented  Grossly normal sensory and motor function  ECG sinus rhythm with P synchronous PACING at 66 Intervals 13/12/43  Device function is normal. NO Programming changes   See Paceart for details     ECG sinus rhythm with QRS duration of 148 ms left bundle branch block 2022-- Multiple reprogramming's were attempted.  Unfortunately at 3.5 V every vector attempted resulted in diaphragmatic stimulation.  We then elected 1-4 at 1.5 V at 1.0 ms.  This resulted in narrowing of the QRS by about 20 ms.  At no point could I get QRS upright in lead V1 or negative in lead I without diaphragmatic stimulation    Assessment and  Plan   Nonischemic cardio  myopathy  ICD-CRT Medtronic   Congestive heart failure-chronic systolic  Left bundle branch block   Prior stroke with residual weakness Device is approaching ERI.  About 4 months.  Will see her again in 6 months.  She is currently bulimic.  Continue her on carvedilol furosemide 40 Entresto and spironolactone.

## 2022-04-28 NOTE — Patient Instructions (Signed)
Medication Instructions:  Your physician recommends that you continue on your current medications as directed. Please refer to the Current Medication list given to you today.  *If you need a refill on your cardiac medications before your next appointment, please call your pharmacy*   Lab Work: None ordered.  If you have labs (blood work) drawn today and your tests are completely normal, you will receive your results only by: MyChart Message (if you have MyChart) OR A paper copy in the mail If you have any lab test that is abnormal or we need to change your treatment, we will call you to review the results.   Testing/Procedures: None ordered.    Follow-Up: At [facility], you and your health needs are our priority.  As part of our continuing mission to provide you with exceptional heart care, we have created designated Provider Care Teams.  These Care Teams include your primary Cardiologist (physician) and Advanced Practice Providers (APPs -  Physician Assistants and Nurse Practitioners) who all work together to provide you with the care you need, when you need it.  We recommend signing up for the patient portal called "MyChart".  Sign up information is provided on this After Visit Summary.  MyChart is used to connect with patients for Virtual Visits (Telemedicine).  Patients are able to view lab/test results, encounter notes, upcoming appointments, etc.  Non-urgent messages can be sent to your provider as well.   To learn more about what you can do with MyChart, go to [email or web address].    Your next appointment:   20mo with Dr Odessa Fleming PA

## 2022-05-05 ENCOUNTER — Encounter: Payer: Self-pay | Admitting: Cardiology

## 2022-05-16 NOTE — Progress Notes (Signed)
Remote ICD transmission.   

## 2022-05-22 ENCOUNTER — Telehealth: Payer: Self-pay | Admitting: *Deleted

## 2022-05-22 NOTE — Telephone Encounter (Signed)
Patient left message about a referral that was supposed to be placed. I attempted to call back no answer. LMTCB

## 2022-05-29 ENCOUNTER — Ambulatory Visit: Payer: 59 | Attending: Internal Medicine

## 2022-05-29 DIAGNOSIS — Z95810 Presence of automatic (implantable) cardiac defibrillator: Secondary | ICD-10-CM

## 2022-05-29 DIAGNOSIS — I5022 Chronic systolic (congestive) heart failure: Secondary | ICD-10-CM

## 2022-05-31 NOTE — Progress Notes (Signed)
EPIC Encounter for ICM Monitoring  Patient Name: [name] is a 72 y.o. female Date: 05/31/2022 Primary Care Physican: Storm Frisk, MD Primary Cardiologist: Shirlee Latch Electrophysiologist: Joycelyn Schmid Pacing: 98%         11/14/2021 Weight: 181 lbs 12/28/2021 Office weight: 181 lbs 03/22/2022 Office Weight: 185 lbs   Battery Longevity: 2 months                                                          Spoke with patient and heart failure questions reviewed.  Transmission results reviewed.  Pt asymptomatic for fluid accumulation.  Reports feeling well at this time and voices no complaints.  Son is in current in outpatient rehab and was hit by car in Feb.     Optivol thoracic impedance suggesting possible fluid accumulation from 5/8 and trending back toward baseline.     Prescribed:  Furosemide 40 mg take 1 tablet by mouth daily Potassium 10 mEq take 4 tablets by mouth daily    Labs: 03/30/2022 Creatinine 0.95, BUN 10, Potassium 3.7, Sodium 140, GFR >60 03/22/2022 Creatinine 0.97, BUN 11, Potassium 2.3, Sodium 140, GFR >60 (low potassium has been addressed by Dr Shirlee Latch on 3/6) 01/30/2022 Creatinine 0.99, BUN 12, Potassium 3.7, Sodium 140  01/25/2022 Creatinine 0.99, BUN 11, Potassium 2.7, Sodium 139  A complete set of results can be found in Results Review.   Recommendations:  No changes and encouraged to call if experiencing any fluid symptoms.   Follow-up plan: ICM clinic phone appointment on 07/03/2022.   91 day device clinic remote transmission 07/17/2022.   EP/Cardiology Office Visits: 07/25/2022 with HF clinic.   Recall 10/25/2022 with Dr Graciela Husbands.   Copy of ICM check sent to Dr. Graciela Husbands.   3 month ICM trend: 05/30/2022.    12-14 Month ICM trend:     Karie Soda, RN 05/31/2022 1:18 PM

## 2022-06-13 ENCOUNTER — Ambulatory Visit (INDEPENDENT_AMBULATORY_CARE_PROVIDER_SITE_OTHER): Payer: 59

## 2022-06-13 DIAGNOSIS — I428 Other cardiomyopathies: Secondary | ICD-10-CM

## 2022-06-13 DIAGNOSIS — I5022 Chronic systolic (congestive) heart failure: Secondary | ICD-10-CM

## 2022-06-14 LAB — CUP PACEART REMOTE DEVICE CHECK
Battery Remaining Longevity: 2 mo
Battery Voltage: 2.79 V
Brady Statistic AP VP Percent: 0 %
Brady Statistic AP VS Percent: 0 %
Brady Statistic AS VP Percent: 98.51 %
Brady Statistic AS VS Percent: 1.48 %
Brady Statistic RA Percent Paced: 0.01 %
Brady Statistic RV Percent Paced: 0.97 %
Date Time Interrogation Session: 20240528062341
HighPow Impedance: 94 Ohm
Implantable Lead Connection Status: 753985
Implantable Lead Connection Status: 753985
Implantable Lead Connection Status: 753985
Implantable Lead Implant Date: 20151021
Implantable Lead Implant Date: 20151021
Implantable Lead Implant Date: 20151021
Implantable Lead Location: 753858
Implantable Lead Location: 753859
Implantable Lead Location: 753860
Implantable Lead Model: 4298
Implantable Lead Model: 5076
Implantable Pulse Generator Implant Date: 20151021
Lead Channel Impedance Value: 1007 Ohm
Lead Channel Impedance Value: 1007 Ohm
Lead Channel Impedance Value: 456 Ohm
Lead Channel Impedance Value: 475 Ohm
Lead Channel Impedance Value: 513 Ohm
Lead Channel Impedance Value: 513 Ohm
Lead Channel Impedance Value: 532 Ohm
Lead Channel Impedance Value: 589 Ohm
Lead Channel Impedance Value: 646 Ohm
Lead Channel Impedance Value: 760 Ohm
Lead Channel Impedance Value: 893 Ohm
Lead Channel Impedance Value: 931 Ohm
Lead Channel Impedance Value: 950 Ohm
Lead Channel Pacing Threshold Amplitude: 0.5 V
Lead Channel Pacing Threshold Amplitude: 0.5 V
Lead Channel Pacing Threshold Amplitude: 1.125 V
Lead Channel Pacing Threshold Pulse Width: 0.4 ms
Lead Channel Pacing Threshold Pulse Width: 0.4 ms
Lead Channel Pacing Threshold Pulse Width: 0.5 ms
Lead Channel Sensing Intrinsic Amplitude: 10.25 mV
Lead Channel Sensing Intrinsic Amplitude: 2.125 mV
Lead Channel Sensing Intrinsic Amplitude: 4 mV
Lead Channel Sensing Intrinsic Amplitude: 4 mV
Lead Channel Setting Pacing Amplitude: 1.25 V
Lead Channel Setting Pacing Amplitude: 1.5 V
Lead Channel Setting Pacing Amplitude: 2 V
Lead Channel Setting Pacing Pulse Width: 0.4 ms
Lead Channel Setting Pacing Pulse Width: 1 ms
Lead Channel Setting Sensing Sensitivity: 0.3 mV
Zone Setting Status: 755011

## 2022-07-02 NOTE — Progress Notes (Deleted)
Established Patient Office Visit  Subjective   Patient ID: MILBREY SHAGENA, female    DOB: Sep 21, 1950  Age: 72 y.o. MRN: [id number]  No chief complaint on file.   Last seen 10/19/21 for AWV medicare This is a 72 year old female who has nonischemic cardiomyopathy mitral regurgitation ICD and pacemaker in place, reflux, previous cerebral infarction due to embolism, stage III kidney disease, hyperkalemia in the past, and obesity.  Patient required hospitalization in November since I saw her previously for a Medicare wellness visit in October.  Below is the discharge summary Hosp in November 2023   Discharge Diagnoses: Principal Problem:   Acute kidney injury superimposed on chronic kidney disease (HCC) Active Problems:   Heart failure with reduced ejection fraction (HCC)- s/p ICD and pacemaker    GERD (gastroesophageal reflux disease)   History of CVA with residual deficit   Obesity (BMI 30-39.9)   Resolved Problems:   * No resolved hospital problems. *   Hospital Course:   72 year old female with past medical history of HFrEF, hypertension, CVA, dyslipidemia, obesity presented with hyperkalemia.  Patient is dose of furosemide was increased due to volume overload with increased potassium supplements.  She started having nausea vomiting for 2 days along with diarrhea.  As outpatient was found to have hyperkalemia and was referred to hospital for further evaluation. Patient was found to have potassium of 5.8   Assessment and Plan:   Acute kidney injury superimposed on CKD stage III a -Patient baseline creatinine was 1.28 on 11/09/2021 -Creatinine today improved to 1.80 -We will discontinue IV fluids   HFrEF s/p ICD/pacemaker -No clinical signs symptoms of decompensation -Started on gentle IV fluids as above -Diuretics on hold -Discussed with heart failure team, they have seen the patient and recommend to hold diuretics, Entresto, Aldactone -Can restart taking Coreg 6.25 mg  p.o. twice a day from 11/25/2021 if systolic blood pressure remains greater than 110     History of CVA with residual deficit -Continue Plavix, atorvastatin     Obesity -BMI 31.5     Following discharge she was seen by cardiology more recently as below [facility] cardiology last note as below  [facility] primary cards  Ms. Vallery is a 72 y.o. female with chronic systolic CHF with nonischemic cardiomypathy (no significant CAD) s/p Medtronic CRT-D, LBBB, mild-mod MR and CVA (09/2013).   Coronary angiography in 7/15 showed no significant CAD.  Admitted in 9/15 with right sided facial droop and expressive aphasia, CVA on MRI. Had TEE showing EF 15%.     QRS narrowed, so in 11/16 device was reprogrammed to VVI.    Echo in 12/17 showed EF up to 45-50%.  Echo in 3/19 showed EF 45-50% with moderate LVH and normal RV.    Echo was done in 4/21 showing EF decreased to 30-35% with LAD territory wall motion abnormalities.  LHC/RHC in 5/21 showed normal filling pressures and cardiac output, no significant CAD.  With persistent LBBB, CRT was activated by Dr. Graciela Husbands in 9/21.    Echo 3/23 showed EF 35-40%, diffuse hypokinesis, mildly decreased RV systolic function.    Follow up 11/09/21 volume overloaded & diuretics/GDMT adjusted. Follow up labs showed AKI and hyperkalemia, with SCr up to 5.32 and K 7.4. She had a 2-day history of N/V/D. Instructed to go to ED. She was admitted, GDMT held, given IVF and hyperkalemia corrected. AHF consulted to restart GDMT. Coreg restarted, all other GDMT held. She was discharged home, weight 184 lbs.   Today she  returns for HF follow up with her husband. We have slowly added back her GDMT over last couple of visits and she has tolerated this well. Overall feeling fine. Main complaint is leg cramps at night. She is not short of breath walking on flat ground with her cane. Denies palpitations, abnormal bleeding, CP, dizziness, edema, or PND/Orthopnea. Appetite ok. No fever or  chills. Weight at home 185 pounds. Taking all medications.    Medtronic device interrogation: OptiVol down but creeping up, decreased thoracic impedance, no AF/VT, >99% BiV pacing, < 1 hr day/activity.  1. Chronic Systolic Heart Failure: Nonischemic cardiomyopathy s/p Medtronic CRT-D in 10/15, coronary angiography without CAD in 07/2013. Echo done in 2/16, EF 25-30%.  Repeat echo in 8/16 with EF up to 35-40%.  Echo in 4/19 showed EF up to 45-50%.  However, echo in 4/21 showed EF back down to 30-35%.  LHC/RHC in 5/21 showed no CAD, normal filling pressures, preserved cardiac output.  Of note, Medtronic device had been reprogrammed to VVI as LBBB has been only intermittent => more recently, LBBB has been persistent and CRT was reactivated in 9/21. Echo 3/23 showed EF 35-40%, diffuse hypokinesis, mildly decreased RV systolic function.  Improved NYHA II-early III symptoms, she is not overloaded by exam but OptiVol slowly tending up. GDMT limited by recent AKI and hyperkalemia. - Continue Farxiga 10 mg daily. BMET and BNP today. - Continue Coreg 3.125 mg bid.  - Continue spironolactone 25 mg daily. - Continue Entresto 49/51 mg bid. - Continue Lasix 40 mg daily. Can take extra PRN. 2. CVA: ?Embolic.  No atrial fibrillation has been detected so far on device.  She will continue Plavix for now. If future device interrogation shows atrial fibrillation, she will need to be anticoagulated.   3. Hyperlipidemia: Goal LDL < 70 with history of CVA. Continue statin.  - Good lipids 3/23. 4. H/o Gastroenteritis: Resolved. Instructed her to hold GDMT if this recurs and call clinic for further medication instructions. 5. SDOH: Engage HFSW to discuss meals on wheels, transportation and information on DME for the home.   Follow up in 2 months with Dr. Shirlee Latch, as scheduled.   Anderson Malta Milford FNP-BC 01/25/2022  Today the patient is requesting a new scale and seeing if she can get Meals on Wheels.  She did have her RSV  vaccine at [facility] 2 weeks ago.  Otherwise she is fully vaccinated.  She would like refills on medications that we are covering.  Her weight is stable at 183 pounds.  The patient just had complete lab screenings with resolution of renal failure creatinine now less than 1 and potassium now normal.  03/28/22 Patient presents with acute onset of off going on for 3 days she coughs to the point where she will have emesis.  She denies heartburn no shortness of breath or chest pain.  She was just seen by cardiology as documented  Acute ov coughing Cards 03/22/22 ASSESSMENT & PLAN: 1. Chronic Systolic Heart Failure: Nonischemic cardiomyopathy s/p Medtronic CRT-D in 10/15, coronary angiography without CAD in 07/2013. Echo done in 2/16, EF 25-30%.  Repeat echo in 8/16 with EF up to 35-40%.  Echo in 4/19 showed EF up to 45-50%.  However, echo in 4/21 showed EF back down to 30-35%.  LHC/RHC in 5/21 showed no CAD, normal filling pressures, preserved cardiac output.  Of note, Medtronic device had been reprogrammed to VVI as LBBB has been only intermittent => more recently, LBBB has been persistent and CRT was  reactivated in 9/21. Echo 3/23 showed EF 35-40%, diffuse hypokinesis, mildly decreased RV systolic function.  NYHA class II symptoms.  Not volume overloaded by exam or Optivol. Weight down 1 lb.  - Increase Coreg to 6.25 mg bid.  - Continue Entresto 49/51 bid. BMET/BNP today.     - Continue Lasix 40 mg daily.  - Will likely need to restart K, check BMET today as above.   - Continue spironolactone 25 mg daily.  - Continue dapagliflozin 10 mg daily.   2. CVA: ?Embolic.  No atrial fibrillation has been detected so far on device.  She will continue Plavix for now. If future device interrogation shows atrial fibrillation, she will need to be anticoagulated.   3. Hyperlipidemia: Goal LDL < 70 with history of CVA.  Continue statin.  - Check lipids today.   6/18 HF clinic 04/2022 1. Chronic Systolic Heart Failure:  Nonischemic cardiomyopathy s/p Medtronic CRT-D in 10/15, coronary angiography without CAD in 07/2013. Echo done in 2/16, EF 25-30%.  Repeat echo in 8/16 with EF up to 35-40%.  Echo in 4/19 showed EF up to 45-50%.  However, echo in 4/21 showed EF back down to 30-35%.  LHC/RHC in 5/21 showed no CAD, normal filling pressures, preserved cardiac output.  Of note, Medtronic device had been reprogrammed to VVI as LBBB has been only intermittent => more recently, LBBB has been persistent and CRT was reactivated in 9/21. Echo 3/23 showed EF 35-40%, diffuse hypokinesis, mildly decreased RV systolic function.  Echo (3/24) showed EF 35-40%. NYHA class II symptoms.  Not volume overloaded by exam or Optivol. Weight down 2 lbs.  - Continue Coreg 6.25 mg bid.  - Continue Entresto 49/51 bid. Recent labs stable    - Continue Lasix 40 mg daily + 40 KCL daily.  - Continue spironolactone 25 mg daily.  - Continue dapagliflozin 10 mg daily.  No GU symptoms 2. CVA: ?Embolic.  No atrial fibrillation has been detected so far on device.  She will continue Plavix for now. If future device interrogation shows atrial fibrillation, she will need to be anticoagulated.   3. Hyperlipidemia: Goal LDL < 70 with history of CVA.  Good lipids 3/24. - Continue statin.    Follow up in 3 months with APP.   Anderson Malta Milford FNP-BC       Review of Systems  Constitutional:  Negative for chills, diaphoresis, fever, malaise/fatigue and weight loss.  HENT:  Positive for congestion and ear discharge. Negative for ear pain, hearing loss, nosebleeds, sinus pain, sore throat and tinnitus.   Eyes:  Negative for blurred vision, photophobia and redness.  Respiratory:  Positive for cough and wheezing. Negative for hemoptysis, sputum production, shortness of breath and stridor.   Cardiovascular:  Negative for chest pain, palpitations, orthopnea, claudication, leg swelling and PND.  Gastrointestinal:  Negative for abdominal pain, blood in stool,  constipation, diarrhea, heartburn, nausea and vomiting.  Genitourinary:  Negative for dysuria, flank pain, frequency, hematuria and urgency.  Musculoskeletal:  Negative for back pain, falls, joint pain, myalgias and neck pain.  Skin:  Negative for itching and rash.  Neurological:  Negative for dizziness, tingling, tremors, sensory change, speech change, focal weakness, seizures, loss of consciousness, weakness and headaches.  Endo/Heme/Allergies:  Negative for environmental allergies and polydipsia. Does not bruise/bleed easily.  Psychiatric/Behavioral:  Negative for depression, memory loss, substance abuse and suicidal ideas. The patient is not nervous/anxious and does not have insomnia.       Objective:  There were no vitals taken for this visit.   Physical Exam Vitals reviewed.  Constitutional:      Appearance: Normal appearance. She is well-developed. She is obese. She is not diaphoretic.  HENT:     Head: Normocephalic and atraumatic.     Right Ear: Tympanic membrane normal.     Left Ear: Tympanic membrane normal.     Nose: Nose normal. No nasal deformity, septal deviation, mucosal edema, congestion or rhinorrhea.     Right Sinus: No maxillary sinus tenderness or frontal sinus tenderness.     Left Sinus: No maxillary sinus tenderness or frontal sinus tenderness.     Mouth/Throat:     Mouth: Mucous membranes are moist.     Pharynx: Oropharynx is clear. No oropharyngeal exudate or posterior oropharyngeal erythema.  Eyes:     General: No scleral icterus.    Conjunctiva/sclera: Conjunctivae normal.     Pupils: Pupils are equal, round, and reactive to light.  Neck:     Thyroid: No thyromegaly.     Vascular: No carotid bruit or JVD.     Trachea: Trachea normal. No tracheal tenderness or tracheal deviation.  Cardiovascular:     Rate and Rhythm: Normal rate and regular rhythm.     Chest Wall: PMI is not displaced.     Pulses: Normal pulses. No decreased pulses.     Heart  sounds: Normal heart sounds, S1 normal and S2 normal. Heart sounds not distant. No murmur heard.    No systolic murmur is present.     No diastolic murmur is present.     No friction rub. No gallop. No S3 or S4 sounds.  Pulmonary:     Effort: Pulmonary effort is normal. No tachypnea, accessory muscle usage or respiratory distress.     Breath sounds: No stridor. No decreased breath sounds, wheezing, rhonchi or rales.  Chest:     Chest wall: No tenderness.  Abdominal:     General: Bowel sounds are normal. There is no distension.     Palpations: Abdomen is soft. Abdomen is not rigid.     Tenderness: There is no abdominal tenderness. There is no guarding or rebound.  Musculoskeletal:        General: Normal range of motion.     Cervical back: Normal range of motion and neck supple. No edema, erythema or rigidity. No muscular tenderness. Normal range of motion.  Lymphadenopathy:     Head:     Right side of head: No submental or submandibular adenopathy.     Left side of head: No submental or submandibular adenopathy.     Cervical: No cervical adenopathy.  Skin:    General: Skin is warm and dry.     Coloration: Skin is not pale.     Findings: No rash.     Nails: There is no clubbing.  Neurological:     Mental Status: She is alert and oriented to person, place, and time.     Sensory: No sensory deficit.  Psychiatric:        Speech: Speech normal.        Behavior: Behavior normal.      No results found for any visits on 07/04/22.    The ASCVD Risk score (Arnett DK, et al., 2019) failed to calculate for the following reasons:   The patient has a prior MI or stroke diagnosis    Assessment & Plan:   Problem List Items Addressed This Visit   None Multiple systems assessed 35  minutes spent  No follow-ups on file.    Shan Levans, MD

## 2022-07-04 ENCOUNTER — Ambulatory Visit: Payer: 59 | Admitting: Critical Care Medicine

## 2022-07-05 NOTE — Progress Notes (Signed)
No ICM remote transmission received for 07/03/2022 and next ICM transmission scheduled for 07/18/2022.

## 2022-07-10 NOTE — Addendum Note (Signed)
Addended by: Geralyn Flash D on: 07/10/2022 02:52 PM   Modules accepted: Level of Service

## 2022-07-10 NOTE — Progress Notes (Signed)
Remote ICD transmission.   

## 2022-07-12 ENCOUNTER — Ambulatory Visit: Payer: 59 | Admitting: Critical Care Medicine

## 2022-07-17 ENCOUNTER — Ambulatory Visit (INDEPENDENT_AMBULATORY_CARE_PROVIDER_SITE_OTHER): Payer: 59

## 2022-07-17 DIAGNOSIS — I428 Other cardiomyopathies: Secondary | ICD-10-CM

## 2022-07-17 DIAGNOSIS — I5022 Chronic systolic (congestive) heart failure: Secondary | ICD-10-CM | POA: Diagnosis not present

## 2022-07-18 ENCOUNTER — Ambulatory Visit: Payer: 59 | Attending: Internal Medicine

## 2022-07-18 DIAGNOSIS — Z95810 Presence of automatic (implantable) cardiac defibrillator: Secondary | ICD-10-CM

## 2022-07-18 DIAGNOSIS — I5022 Chronic systolic (congestive) heart failure: Secondary | ICD-10-CM

## 2022-07-18 LAB — CUP PACEART REMOTE DEVICE CHECK
Battery Remaining Longevity: 2 mo
Battery Voltage: 2.78 V
Brady Statistic AP VP Percent: 0 %
Brady Statistic AP VS Percent: 0 %
Brady Statistic AS VP Percent: 98.49 %
Brady Statistic AS VS Percent: 1.5 %
Brady Statistic RA Percent Paced: 0.01 %
Brady Statistic RV Percent Paced: 0.42 %
Date Time Interrogation Session: 20240701082503
HighPow Impedance: 93 Ohm
Implantable Lead Connection Status: 753985
Implantable Lead Connection Status: 753985
Implantable Lead Connection Status: 753985
Implantable Lead Implant Date: 20151021
Implantable Lead Implant Date: 20151021
Implantable Lead Implant Date: 20151021
Implantable Lead Location: 753858
Implantable Lead Location: 753859
Implantable Lead Location: 753860
Implantable Lead Model: 4298
Implantable Lead Model: 5076
Implantable Pulse Generator Implant Date: 20151021
Lead Channel Impedance Value: 1007 Ohm
Lead Channel Impedance Value: 418 Ohm
Lead Channel Impedance Value: 456 Ohm
Lead Channel Impedance Value: 475 Ohm
Lead Channel Impedance Value: 513 Ohm
Lead Channel Impedance Value: 532 Ohm
Lead Channel Impedance Value: 589 Ohm
Lead Channel Impedance Value: 646 Ohm
Lead Channel Impedance Value: 760 Ohm
Lead Channel Impedance Value: 874 Ohm
Lead Channel Impedance Value: 931 Ohm
Lead Channel Impedance Value: 950 Ohm
Lead Channel Impedance Value: 988 Ohm
Lead Channel Pacing Threshold Amplitude: 0.5 V
Lead Channel Pacing Threshold Amplitude: 0.5 V
Lead Channel Pacing Threshold Amplitude: 1.125 V
Lead Channel Pacing Threshold Pulse Width: 0.4 ms
Lead Channel Pacing Threshold Pulse Width: 0.4 ms
Lead Channel Pacing Threshold Pulse Width: 0.5 ms
Lead Channel Sensing Intrinsic Amplitude: 10.25 mV
Lead Channel Sensing Intrinsic Amplitude: 2.125 mV
Lead Channel Sensing Intrinsic Amplitude: 4 mV
Lead Channel Sensing Intrinsic Amplitude: 4 mV
Lead Channel Setting Pacing Amplitude: 1.25 V
Lead Channel Setting Pacing Amplitude: 1.5 V
Lead Channel Setting Pacing Amplitude: 2 V
Lead Channel Setting Pacing Pulse Width: 0.4 ms
Lead Channel Setting Pacing Pulse Width: 1 ms
Lead Channel Setting Sensing Sensitivity: 0.3 mV
Zone Setting Status: 755011

## 2022-07-19 NOTE — Progress Notes (Signed)
EPIC Encounter for ICM Monitoring  Patient Name: [name] is a 72 y.o. female Date: 07/19/2022 Primary Care Physican: Storm Frisk, MD Primary Cardiologist: Shirlee Latch Electrophysiologist: Joycelyn Schmid Pacing: 97.4%         04/28/2022 Office Weight: 182 lbs   Battery Longevity: 2 months                                                          Spoke with patient and heart failure questions reviewed.  Transmission results reviewed.  Pt asymptomatic for fluid accumulation.  Reports feeling well at this time and voices no complaints.     Optivol thoracic impedance suggesting normal fluid levels within the last month.     Prescribed:  Furosemide 40 mg take 1 tablet by mouth daily Potassium 10 mEq take 4 tablets by mouth daily    Labs: 03/30/2022 Creatinine 0.95, BUN 10, Potassium 3.7, Sodium 140, GFR >60 03/22/2022 Creatinine 0.97, BUN 11, Potassium 2.3, Sodium 140, GFR >60 (low potassium has been addressed by Dr Shirlee Latch on 3/6) 01/30/2022 Creatinine 0.99, BUN 12, Potassium 3.7, Sodium 140  01/25/2022 Creatinine 0.99, BUN 11, Potassium 2.7, Sodium 139  A complete set of results can be found in Results Review.   Recommendations:  No changes and encouraged to call if experiencing any fluid symptoms.   Follow-up plan: ICM clinic phone appointment on 08/21/2022.   91 day device clinic remote transmission 10/17/2022.   EP/Cardiology Office Visits: 07/25/2022 with HF clinic.   Recall 10/25/2022 with Dr Graciela Husbands.   Copy of ICM check sent to Dr. Graciela Husbands.    3 month ICM trend: 07/17/2022.    12-14 Month ICM trend:     Karie Soda, RN 07/19/2022 1:09 PM

## 2022-07-24 ENCOUNTER — Other Ambulatory Visit: Payer: Self-pay | Admitting: Critical Care Medicine

## 2022-07-25 ENCOUNTER — Encounter (HOSPITAL_COMMUNITY): Payer: 59

## 2022-07-26 ENCOUNTER — Ambulatory Visit: Payer: 59 | Attending: Critical Care Medicine

## 2022-07-26 VITALS — Ht 64.0 in | Wt 182.0 lb

## 2022-07-26 DIAGNOSIS — Z0000 Encounter for general adult medical examination without abnormal findings: Secondary | ICD-10-CM

## 2022-07-26 NOTE — Progress Notes (Signed)
Subjective:   [name] is a 72 y.o. female who presents for Medicare Annual (Subsequent) preventive examination.  Visit Complete: Virtual  I connected with  [name] on 07/26/22 by a audio enabled telemedicine application and verified that I am speaking with the correct person using two identifiers.  Patient Location: Home  Provider Location: Home Office  I discussed the limitations of evaluation and management by telemedicine. The patient expressed understanding and agreed to proceed.  Review of Systems     Cardiac Risk Factors include: advanced age (>43men, >56 women);dyslipidemia;hypertension;sedentary lifestyle     Objective:    Today's Vitals   07/26/22 1212  Weight: 182 lb (82.6 kg)  Height: 5\' 4"  (1.626 m)   Body mass index is 31.24 kg/m.     07/26/2022   12:17 PM 11/21/2021    3:45 PM 06/05/2019   10:53 AM 07/12/2018    8:50 AM 10/19/2017    8:42 AM 08/03/2017    8:33 AM 07/09/2017    7:59 AM  Advanced Directives  Does Patient Have a Medical Advance Directive? No No  No No No No  Does patient want to make changes to medical advance directive?   Yes (MAU/Ambulatory/Procedural Areas - Information given)      Would patient like information on creating a medical advance directive? Yes (MAU/Ambulatory/Procedural Areas - Information given) No - Patient declined  No - Patient declined No - Patient declined No - Patient declined No - Patient declined    Current Medications (verified) Outpatient Encounter Medications as of 07/26/2022  Medication Sig   albuterol (VENTOLIN HFA) 108 (90 Base) MCG/ACT inhaler Inhale 2 puffs into the lungs at bedtime as needed for wheezing or shortness of breath.   atorvastatin (LIPITOR) 80 MG tablet Take 1 tablet (80 mg total) by mouth daily at 6 PM.   carvedilol (COREG) 6.25 MG tablet Take 1 tablet (6.25 mg total) by mouth 2 (two) times daily with a meal.   cetirizine (ZYRTEC) 10 MG tablet Take 1 tablet (10 mg total) by mouth  daily.   clopidogrel (PLAVIX) 75 MG tablet Take 1 tablet (75 mg total) by mouth daily.   dapagliflozin propanediol (FARXIGA) 10 MG TABS tablet Take 1 tablet (10 mg total) by mouth daily before breakfast.   fluticasone (FLONASE) 50 MCG/ACT nasal spray Place 2 sprays into both nostrils daily.   furosemide (LASIX) 40 MG tablet Take 1 tablet (40 mg total) by mouth daily.   pantoprazole (PROTONIX) 40 MG tablet Take 1 tablet (40 mg total) by mouth daily.   potassium chloride (KLOR-CON M) 10 MEQ tablet Take 40 mEq by mouth daily.   sacubitril-valsartan (ENTRESTO) 49-51 MG Take 1 tablet by mouth 2 (two) times daily.   spironolactone (ALDACTONE) 25 MG tablet Take 1 tablet (25 mg total) by mouth daily.   No facility-administered encounter medications on file as of 07/26/2022.    Allergies (verified) Voltaren [diclofenac]   History: Past Medical History:  Diagnosis Date   A-fib (HCC)    Acute kidney injury superimposed on chronic kidney disease (HCC) 11/21/2021   Arthritis    Automatic implantable cardioverter-defibrillator in situ    Cardiac resynchronization therapy defibrillator (CRT-D) in place    a. placed on 11/05/13.   GERD (gastroesophageal reflux disease)    HLD (hyperlipidemia)    Hyperkalemia 11/21/2021   Hypotension    LBBB (left bundle branch block)    Mitral regurgitation    a. Mild-mod MR by echo 07/2013.   PONV (  postoperative nausea and vomiting)    Stage 3a chronic kidney disease (HCC) 02/22/2021   Stroke (HCC) 09/23/2013   Systolic CHF (HCC)    a. Dx 07/2013 - due to NICM. Cath normal cors. EF 25%.   Past Surgical History:  Procedure Laterality Date   BI-VENTRICULAR IMPLANTABLE CARDIOVERTER DEFIBRILLATOR N/A 11/05/2013   Procedure: BI-VENTRICULAR IMPLANTABLE CARDIOVERTER DEFIBRILLATOR  (CRT-D);  Surgeon: Duke Salvia, MD;  Location: [facility] CATH LAB;  Service: Cardiovascular;  Laterality: N/A;   BI-VENTRICULAR IMPLANTABLE CARDIOVERTER DEFIBRILLATOR  (CRT-D)  11/05/2013    CARDIAC CATHETERIZATION  07/2013   KNEE ARTHROSCOPY Right    LEFT AND RIGHT HEART CATHETERIZATION WITH CORONARY ANGIOGRAM N/A 08/08/2013   Procedure: LEFT AND RIGHT HEART CATHETERIZATION WITH CORONARY ANGIOGRAM;  Surgeon: [name], MD;  Location: [facility] CATH LAB;  Service: Cardiovascular;  Laterality: N/A;   RIGHT/LEFT HEART CATH AND CORONARY ANGIOGRAPHY N/A 06/05/2019   Procedure: RIGHT/LEFT HEART CATH AND CORONARY ANGIOGRAPHY;  Surgeon: [name], MD;  Location: [facility] INVASIVE CV LAB;  Service: Cardiovascular;  Laterality: N/A;   TEE WITHOUT CARDIOVERSION N/A 09/26/2013   Procedure: TRANSESOPHAGEAL ECHOCARDIOGRAM (TEE);  Surgeon: [name], MD;  Location: [facility] ENDOSCOPY;  Service: Cardiovascular;  Laterality: N/A;   TONSILLECTOMY     WRIST ARTHROSCOPY  12/21/2010   Procedure: ARTHROSCOPY WRIST;  Surgeon: [name], MD;  Location: [facility];  Service: Orthopedics;  Laterality: Right;  right wrist, repair TFCC on right, open reconstruction ECU sheath   Family History  Problem Relation Age of Onset   Congestive Heart Failure Mother    Cancer Mother 32       Breast   Cancer Father 30       Oat Cell CA   Lupus Sister    Heart disease Brother    Social History   Socioeconomic History   Marital status: Married    Spouse name: Not on file   Number of children: 1   Years of education: 12   Highest education level: Not on file  Occupational History   Not on file  Tobacco Use   Smoking status: Never   Smokeless tobacco: Never  Vaping Use   Vaping Use: Never used  Substance and Sexual Activity   Alcohol use: No    Alcohol/week: 0.0 standard drinks of alcohol   Drug use: No   Sexual activity: Yes  Other Topics Concern   Not on file  Social History Narrative   Patient is married with one child.   Patient is right handed.   Patient has hs education and 3 yrs of college.   Patient drinks 1 can daily.   Social Determinants of Health   Financial  Resource Strain: Low Risk  (07/26/2022)   Overall Financial Resource Strain (CARDIA)    Difficulty of Paying Living Expenses: Not hard at all  Food Insecurity: No Food Insecurity (07/26/2022)   Hunger Vital Sign    Worried About Running Out of Food in the Last Year: Never true    Ran Out of Food in the Last Year: Never true  Transportation Needs: No Transportation Needs (07/26/2022)   PRAPARE - Administrator, Civil Service (Medical): No    Lack of Transportation (Non-Medical): No  Physical Activity: Insufficiently Active (07/26/2022)   Exercise Vital Sign    Days of Exercise per Week: 3 days    Minutes of Exercise per Session: 30 min  Stress: No Stress Concern Present (07/26/2022)   [employer]  of Occupational Health - Occupational Stress Questionnaire    Feeling of Stress : Not at all  Social Connections: Moderately Integrated (07/26/2022)   Social Connection and Isolation Panel [NHANES]    Frequency of Communication with Friends and Family: More than three times a week    Frequency of Social Gatherings with Friends and Family: Three times a week    Attends Religious Services: 1 to 4 times per year    Active Member of Clubs or Organizations: No    Attends Banker Meetings: Never    Marital Status: Married    Tobacco Counseling Counseling given: Not Answered   Clinical Intake:  Pre-visit preparation completed: Yes  Pain : No/denies pain     Diabetes: No  How often do you need to have someone help you when you read instructions, pamphlets, or other written materials from your doctor or pharmacy?: 1 - Never  Interpreter Needed?: No  Information entered by :: Kandis Fantasia LPN   Activities of Daily Living    07/26/2022   12:16 PM 11/23/2021   12:48 PM  In your present state of health, do you have any difficulty performing the following activities:  Hearing? 0 0  Vision? 0 1  Difficulty concentrating or making decisions? 0 0  Walking or  climbing stairs? 0 1  Dressing or bathing? 0 1  Doing errands, shopping? 0 1  Preparing Food and eating ? N   Using the Toilet? N   In the past six months, have you accidently leaked urine? N   Do you have problems with loss of bowel control? N   Managing your Medications? N   Managing your Finances? N   Housekeeping or managing your Housekeeping? N     Patient Care Team: Storm Frisk, MD as PCP - General (Pulmonary Disease) [name], MD as PCP - Advanced Heart Failure (Cardiology)  Indicate any recent Medical Services you may have received from other than [facility] providers in the past year (date may be approximate).     Assessment:   This is a routine wellness examination for [place].  Hearing/Vision screen Hearing Screening - Comments:: Denies hearing difficulties   Vision Screening - Comments:: No vision problems; will schedule routine eye exam soon    Dietary issues and exercise activities discussed:     Goals Addressed             This Visit's Progress    Remain active and independent        Depression Screen    07/26/2022   12:21 PM 03/28/2022   10:15 AM 02/22/2022   10:35 AM 10/19/2021   11:09 AM 05/24/2021   10:58 AM 02/22/2021    8:39 AM 07/12/2018    8:49 AM  PHQ 2/9 Scores  PHQ - 2 Score 0 0 0 0 0 0 0  PHQ- 9 Score  0 1  0      Fall Risk    07/26/2022   12:16 PM 10/19/2021   11:09 AM 05/24/2021   10:57 AM 02/22/2021    8:39 AM 07/12/2018    8:49 AM  Fall Risk   Falls in the past year? 0 0 1 0 0  Number falls in past yr: 0 0 1 0 0  Injury with Fall? 0 0 1 0   Risk for fall due to : No Fall Risks No Fall Risks Impaired mobility;Impaired balance/gait No Fall Risks   Follow up Falls prevention discussed;Education provided;Falls evaluation completed  Falls evaluation completed Falls evaluation completed      MEDICARE RISK AT HOME:  Medicare Risk at Home - 07/26/22 1216     Any stairs in or around the home? No    If so, are there any without  handrails? No    Home free of loose throw rugs in walkways, pet beds, electrical cords, etc? Yes    Adequate lighting in your home to reduce risk of falls? Yes    Life alert? No    Use of a cane, walker or w/c? No    Grab bars in the bathroom? Yes    Shower chair or bench in shower? No    Elevated toilet seat or a handicapped toilet? No             TIMED UP AND GO:  Was the test performed?  No    Cognitive Function:    10/19/2021   11:16 AM  MMSE - Mini Mental State Exam  Orientation to time 5  Orientation to Place 5  Registration 3  Attention/ Calculation 5  Recall 3  Language- name 2 objects 2  Language- repeat 1  Language- follow 3 step command 3  Language- read & follow direction 1  Write a sentence 1  Copy design 1  Total score 30        07/26/2022   12:17 PM  6CIT Screen  What Year? 0 points  What month? 0 points  What time? 0 points  Count back from 20 0 points  Months in reverse 0 points  Repeat phrase 2 points  Total Score 2 points    Immunizations Immunization History  Administered Date(s) Administered   Covid-19, Mrna,Vaccine(Spikevax)64yrs and older 03/20/2019, 04/17/2019   Fluad Quad(high Dose 65+) 10/19/2021   Influenza, High Dose Seasonal PF 09/15/2016   Influenza,inj,Quad PF,6+ Mos 04/09/2013, 11/06/2013, 09/24/2014, 10/19/2017, 09/16/2018   Influenza-Unspecified 11/17/2015, 10/15/2020   Moderna Sars-Covid-2 Vaccination 09/06/2019, 04/14/2020   PFIZER(Purple Top)SARS-COV-2 Vaccination 09/30/2020, 10/12/2021   PNEUMOCOCCAL CONJUGATE-20 10/19/2021   Pneumococcal Conjugate-13 08/09/2016   Pneumococcal Polysaccharide-23 09/25/2013   Tdap 07/09/2017   Zoster Recombinant(Shingrix) 09/15/2016    TDAP status: Up to date  Pneumococcal vaccine status: Up to date  Covid-19 vaccine status: Information provided on how to obtain vaccines.   Qualifies for Shingles Vaccine? Yes   Zostavax completed No   Shingrix Completed?: No.    Education  has been provided regarding the importance of this vaccine. Patient has been advised to call insurance company to determine out of pocket expense if they have not yet received this vaccine. Advised may also receive vaccine at local pharmacy or Health Dept. Verbalized acceptance and understanding.  Screening Tests Health Maintenance  Topic Date Due   COVID-19 Vaccine (7 - 2023-24 season) 08/11/2022 (Originally 12/07/2021)   Zoster Vaccines- Shingrix (2 of 2) 10/26/2022 (Originally 11/10/2016)   INFLUENZA VACCINE  08/17/2022   MAMMOGRAM  09/16/2022   Medicare Annual Wellness (AWV)  07/26/2023   Colonoscopy  11/08/2024   DTaP/Tdap/Td (2 - Td or Tdap) 07/10/2027   Pneumonia Vaccine 36+ Years old  Completed   DEXA SCAN  Completed   Hepatitis C Screening  Completed   HPV VACCINES  Aged Out    Health Maintenance  There are no preventive care reminders to display for this patient.   Colorectal cancer screening: Type of screening: Colonoscopy. Completed 11/09/14. Repeat every 10 years  Mammogram status: Patient declines at this time  Bone Density status: Completed 01/07/10. Results reflect: Bone  density results: OSTEOPENIA. Repeat every 2 years.  Lung Cancer Screening: (Low Dose CT Chest recommended if Age 80-80 years, 20 pack-year currently smoking OR have quit w/in 15years.) does not qualify.   Lung Cancer Screening Referral: n/a  Additional Screening:  Hepatitis C Screening: does qualify; Completed 02/11/15  Vision Screening: Recommended annual ophthalmology exams for early detection of glaucoma and other disorders of the eye. Is the patient up to date with their annual eye exam?  No  Who is the provider or what is the name of the office in which the patient attends annual eye exams? none If pt is not established with a provider, would they like to be referred to a provider to establish care? No .   Dental Screening: Recommended annual dental exams for proper oral  hygiene  Community Resource Referral / Chronic Care Management: CRR required this visit?  No   CCM required this visit?  No     Plan:     I have personally reviewed and noted the following in the patient's chart:   Medical and social history Use of alcohol, tobacco or illicit drugs  Current medications and supplements including opioid prescriptions. Patient is not currently taking opioid prescriptions. Functional ability and status Nutritional status Physical activity Advanced directives List of other physicians Hospitalizations, surgeries, and ER visits in previous 12 months Vitals Screenings to include cognitive, depression, and falls Referrals and appointments  In addition, I have reviewed and discussed with patient certain preventive protocols, quality metrics, and best practice recommendations. A written personalized care plan for preventive services as well as general preventive health recommendations were provided to patient.     Kandis Fantasia [place], [place]   04/24/8117   After Visit Summary: (MyChart) Due to this being a telephonic visit, the after visit summary with patients personalized plan was offered to patient via MyChart   Nurse Notes: No concerns

## 2022-07-26 NOTE — Patient Instructions (Signed)
[name] , Thank you for taking time to come for your Medicare Wellness Visit. I appreciate your ongoing commitment to your health goals. Please review the following plan we discussed and let me know if I can assist you in the future.   These are the goals we discussed:  Goals      Remain active and independent        This is a list of the screening recommended for you and due dates:  Health Maintenance  Topic Date Due   COVID-19 Vaccine (7 - 2023-24 season) 08/11/2022*   Zoster (Shingles) Vaccine (2 of 2) 10/26/2022*   Flu Shot  08/17/2022   Mammogram  09/16/2022   Medicare Annual Wellness Visit  07/26/2023   Colon Cancer Screening  11/08/2024   DTaP/Tdap/Td vaccine (2 - Td or Tdap) 07/10/2027   Pneumonia Vaccine  Completed   DEXA scan (bone density measurement)  Completed   Hepatitis C Screening  Completed   HPV Vaccine  Aged Out  *Topic was postponed. The date shown is not the original due date.    Advanced directives: Information on Advanced Care Planning can be found at [facility] Advance Health Care Directives Advance Health Care Directives ([email or web address] Please bring a copy of your health care power of attorney and living will to the office to be added to your chart at your convenience.  Conditions/risks identified: Aim for 30 minutes of exercise or brisk walking, 6-8 glasses of water, and 5 servings of fruits and vegetables each day.  Next appointment: Follow up in one year for your annual wellness visit    Preventive Care 65 Years and Older, Female Preventive care refers to lifestyle choices and visits with your health care provider that can promote health and wellness. What does preventive care include? A yearly physical exam. This is also called an annual well check. Dental exams once or twice a year. Routine eye exams. Ask your health care provider how often you should have your eyes checked. Personal lifestyle choices, including: Daily care  of your teeth and gums. Regular physical activity. Eating a healthy diet. Avoiding tobacco and drug use. Limiting alcohol use. Practicing safe sex. Taking low-dose aspirin every day. Taking vitamin and mineral supplements as recommended by your health care provider. What happens during an annual well check? The services and screenings done by your health care provider during your annual well check will depend on your age, overall health, lifestyle risk factors, and family history of disease. Counseling  Your health care provider may ask you questions about your: Alcohol use. Tobacco use. Drug use. Emotional well-being. Home and relationship well-being. Sexual activity. Eating habits. History of falls. Memory and ability to understand (cognition). Work and work Astronomer. Reproductive health. Screening  You may have the following tests or measurements: Height, weight, and BMI. Blood pressure. Lipid and cholesterol levels. These may be checked every 5 years, or more frequently if you are over 55 years old. Skin check. Lung cancer screening. You may have this screening every year starting at age 96 if you have a 30-pack-year history of smoking and currently smoke or have quit within the past 15 years. Fecal occult blood test (FOBT) of the stool. You may have this test every year starting at age 17. Flexible sigmoidoscopy or colonoscopy. You may have a sigmoidoscopy every 5 years or a colonoscopy every 10 years starting at age 27. Hepatitis C blood test. Hepatitis B blood test. Sexually transmitted disease (STD) testing. Diabetes  screening. This is done by checking your blood sugar (glucose) after you have not eaten for a while (fasting). You may have this done every 1-3 years. Bone density scan. This is done to screen for osteoporosis. You may have this done starting at age 39. Mammogram. This may be done every 1-2 years. Talk to your health care provider about how often you  should have regular mammograms. Talk with your health care provider about your test results, treatment options, and if necessary, the need for more tests. Vaccines  Your health care provider may recommend certain vaccines, such as: Influenza vaccine. This is recommended every year. Tetanus, diphtheria, and acellular pertussis (Tdap, Td) vaccine. You may need a Td booster every 10 years. Zoster vaccine. You may need this after age 52. Pneumococcal 13-valent conjugate (PCV13) vaccine. One dose is recommended after age 72. Pneumococcal polysaccharide (PPSV23) vaccine. One dose is recommended after age 61. Talk to your health care provider about which screenings and vaccines you need and how often you need them. This information is not intended to replace advice given to you by your health care provider. Make sure you discuss any questions you have with your health care provider. Document Released: 01/29/2015 Document Revised: 09/22/2015 Document Reviewed: 11/03/2014 Elsevier Interactive Patient Education  2017 [employer].  Fall Prevention in the Home Falls can cause injuries. They can happen to people of all ages. There are many things you can do to make your home safe and to help prevent falls. What can I do on the outside of my home? Regularly fix the edges of walkways and driveways and fix any cracks. Remove anything that might make you trip as you walk through a door, such as a raised step or threshold. Trim any bushes or trees on the path to your home. Use bright outdoor lighting. Clear any walking paths of anything that might make someone trip, such as rocks or tools. Regularly check to see if handrails are loose or broken. Make sure that both sides of any steps have handrails. Any raised decks and porches should have guardrails on the edges. Have any leaves, snow, or ice cleared regularly. Use sand or salt on walking paths during winter. Clean up any spills in your garage right away.  This includes oil or grease spills. What can I do in the bathroom? Use night lights. Install grab bars by the toilet and in the tub and shower. Do not use towel bars as grab bars. Use non-skid mats or decals in the tub or shower. If you need to sit down in the shower, use a plastic, non-slip stool. Keep the floor dry. Clean up any water that spills on the floor as soon as it happens. Remove soap buildup in the tub or shower regularly. Attach bath mats securely with double-sided non-slip rug tape. Do not have throw rugs and other things on the floor that can make you trip. What can I do in the bedroom? Use night lights. Make sure that you have a light by your bed that is easy to reach. Do not use any sheets or blankets that are too big for your bed. They should not hang down onto the floor. Have a firm chair that has side arms. You can use this for support while you get dressed. Do not have throw rugs and other things on the floor that can make you trip. What can I do in the kitchen? Clean up any spills right away. Avoid walking on wet floors. Keep  items that you use a lot in easy-to-reach places. If you need to reach something above you, use a strong step stool that has a grab bar. Keep electrical cords out of the way. Do not use floor polish or wax that makes floors slippery. If you must use wax, use non-skid floor wax. Do not have throw rugs and other things on the floor that can make you trip. What can I do with my stairs? Do not leave any items on the stairs. Make sure that there are handrails on both sides of the stairs and use them. Fix handrails that are broken or loose. Make sure that handrails are as long as the stairways. Check any carpeting to make sure that it is firmly attached to the stairs. Fix any carpet that is loose or worn. Avoid having throw rugs at the top or bottom of the stairs. If you do have throw rugs, attach them to the floor with carpet tape. Make sure that  you have a light switch at the top of the stairs and the bottom of the stairs. If you do not have them, ask someone to add them for you. What else can I do to help prevent falls? Wear shoes that: Do not have high heels. Have rubber bottoms. Are comfortable and fit you well. Are closed at the toe. Do not wear sandals. If you use a stepladder: Make sure that it is fully opened. Do not climb a closed stepladder. Make sure that both sides of the stepladder are locked into place. Ask someone to hold it for you, if possible. Clearly mark and make sure that you can see: Any grab bars or handrails. First and last steps. Where the edge of each step is. Use tools that help you move around (mobility aids) if they are needed. These include: Canes. Walkers. Scooters. Crutches. Turn on the lights when you go into a dark area. Replace any light bulbs as soon as they burn out. Set up your furniture so you have a clear path. Avoid moving your furniture around. If any of your floors are uneven, fix them. If there are any pets around you, be aware of where they are. Review your medicines with your doctor. Some medicines can make you feel dizzy. This can increase your chance of falling. Ask your doctor what other things that you can do to help prevent falls. This information is not intended to replace advice given to you by your health care provider. Make sure you discuss any questions you have with your health care provider. Document Released: 10/29/2008 Document Revised: 06/10/2015 Document Reviewed: 02/06/2014 Elsevier Interactive Patient Education  2017 [employer].

## 2022-07-27 ENCOUNTER — Encounter (HOSPITAL_COMMUNITY): Payer: 59

## 2022-08-07 NOTE — Progress Notes (Signed)
Remote ICD transmission.   

## 2022-08-08 ENCOUNTER — Encounter (HOSPITAL_COMMUNITY): Payer: 59

## 2022-08-21 ENCOUNTER — Ambulatory Visit (INDEPENDENT_AMBULATORY_CARE_PROVIDER_SITE_OTHER): Payer: Medicare Other

## 2022-08-21 ENCOUNTER — Ambulatory Visit: Payer: 59 | Attending: Internal Medicine

## 2022-08-21 DIAGNOSIS — I5022 Chronic systolic (congestive) heart failure: Secondary | ICD-10-CM | POA: Diagnosis not present

## 2022-08-21 DIAGNOSIS — Z95810 Presence of automatic (implantable) cardiac defibrillator: Secondary | ICD-10-CM

## 2022-08-21 DIAGNOSIS — I428 Other cardiomyopathies: Secondary | ICD-10-CM

## 2022-08-23 NOTE — Progress Notes (Signed)
Patient ID: [name], female   DOB: 04-12-1950, 72 y.o.   MRN: [id number]     PCP: Storm Frisk, MD Neurologist: Dr Pearlean Brownie EP: Dr Graciela Husbands.  HF Cardiologist: Dr Shirlee Latch   HPI: [name] is a 72 y.o. female with chronic systolic CHF with nonischemic cardiomypathy (no significant CAD) s/p Medtronic CRT-D, LBBB, mild-mod MR and CVA (09/2013).  Coronary angiography in 7/15 showed no significant CAD.  Admitted in 9/15 with right sided facial droop and expressive aphasia, CVA on MRI. Had TEE showing EF 15%.    QRS narrowed, so in 11/16 device was reprogrammed to VVI.   Echo 12/17: EF up to 45-50%.  Echo 3/19: EF 45-50% with moderate LVH and normal RV.   Echo 4/21 showed EF decreased to 30-35% with LAD territory WMA.  LHC/RHC in 5/21 showed normal filling pressures and cardiac output, no significant CAD.  With persistent LBBB, CRT was activated by Dr. Graciela Husbands in 9/21.   Echo 3/23: EF 35-40%, diffuse HKN, mildly decreased RV systolic function.   Echo 3/24: EF 35-40%, global HKN, mildly reduced RV systolic function.  Today she returns for AHF follow up with her husband and son. Syncopal episode about 1 month ago, got dizzy while walking around [employer]. Resolved with water and chocolate bar. Reports dizziness when she stands up too fast. Overall feeling fine. Denies palpitations, CP, edema, or PND/Orthopnea. SOB with activity. Reports benopnea. Appetite ok, eats small portions. Drinks ~1.5L/day. No fever or chills. Weight at home 174 pounds. Taking all medications.   Medtronic device interrogation: Optivol low, Thoracic impedance below threshold. Activity 0.4hr/day. No AT/AF. Total VP 97.7%  Labs (2/21): K 3.8, creatinine 0.96, LDL 105  Labs (9/21): LDL 92 Labs (12/21): K 3.7, creatinine 0.87 Labs (4/22): LDL 66 Labs (8/22): K 3.9, creatinine 1.12 Labs (1/23): K 4, creatinine 1.14 Labs (3/23): LDL 71, HDL 34 Labs (6/23): K 5.0, creatinine 1.61 Labs (10/23): K 4.4, creatinine 1.96 Labs  (1/24): K 3.7, creatinine 0.99 Labs (3/24): K 3.7, creatinine 0.95, LDL 66  ECG (personally reviewed): none ordered today  PMH: 1. Nonischemic cardiomyopathy: Echo (7/15) with severe LV dilation, EF 25% with diffuse hypokinesis, mild to moderate MR.  RHC/ LHC 08/08/13 with RA 4, PA 23/8, PCWP 11, CI 2.52; no CAD.  TEE (9/15) with EF 15-20%, severe global hypokinesis, no LV thrombus, moderate MR, normal RV.  Echo (10/2013): EF 25-30%, grade I DD, mild AI. Medtronic CRT-D (10/15).  Echo (2/16) with EF 25-30%, diffuse hypokinesis worse in the septum, grade I diastolic dysfunction, normal RV size and systolic function, mild-moderate MR.  - Echo (8/16) with EF 35-40%, diffuse hypokinesis, mild-moderate mitral regurgitation.  - Echo (12/17): EF 45-50%, mild LVH, mild to moderate MR.  - Echo (3/19): EF 45-50% with moderate LVH, mild MR, normal RV size and systolic function.  - Echo (4/21): EF 30-35% with LAD territory wall motion abnormalities. - LHC/RHC (5/21): No significant CAD; mean RA 6, PA 31/13, mean PCWP 13, CI 3.37.  - Echo (3/23): EF 35-40%, diffuse hypokinesis, mildly decreased RV systolic function. - Echo (3/24): EF 35-40%, grade I DD, RV mildly reduced 2. CVA: 9/15 with dysarthria.  Carotids 9/15 with no significant CAD.  - Carotid dopplers (10/17): mild plaque, normal subclavians.  3. LBBB: Medtronic CRT-D (10/2013) 4. Hyperlipidemia 5. Cholelithiasis 6. Peripheral arterial dopplers (6/19): Normal  SH: lives with her son and husband. Does not drink or smoke. Currently not working.   FH: Mom had A fib and Heart  Failure. Deceased 58        Sister has lupus  ROS: All systems negative except as listed in HPI, PMH and Problem List.  Current Outpatient Medications  Medication Sig Dispense Refill   albuterol (VENTOLIN HFA) 108 (90 Base) MCG/ACT inhaler Inhale 2 puffs into the lungs at bedtime as needed for wheezing or shortness of breath. 18 g 3   atorvastatin (LIPITOR) 80 MG tablet  Take 1 tablet (80 mg total) by mouth daily at 6 PM. 90 tablet 3   carvedilol (COREG) 6.25 MG tablet Take 1 tablet (6.25 mg total) by mouth 2 (two) times daily with a meal. 180 tablet 3   cetirizine (ZYRTEC) 10 MG tablet Take 1 tablet (10 mg total) by mouth daily. 60 tablet 3   clopidogrel (PLAVIX) 75 MG tablet Take 1 tablet (75 mg total) by mouth daily. 90 tablet 3   dapagliflozin propanediol (FARXIGA) 10 MG TABS tablet Take 1 tablet (10 mg total) by mouth daily before breakfast. 30 tablet 11   fluticasone (FLONASE) 50 MCG/ACT nasal spray Place 2 sprays into both nostrils daily. 16 g 1   furosemide (LASIX) 40 MG tablet Take 1 tablet (40 mg total) by mouth daily. 90 tablet 3   pantoprazole (PROTONIX) 40 MG tablet Take 1 tablet (40 mg total) by mouth daily. 60 tablet 3   Potassium Chloride (KLOR-CON PO) Take 20 mEq by mouth 2 (two) times daily.     sacubitril-valsartan (ENTRESTO) 49-51 MG Take 1 tablet by mouth 2 (two) times daily. 180 tablet 3   spironolactone (ALDACTONE) 25 MG tablet Take 1 tablet (25 mg total) by mouth daily. 90 tablet 3   No current facility-administered medications for this encounter.   BP (!) 98/56   Pulse 79   Wt 80.8 kg (178 lb 3.2 oz)   SpO2 100%   BMI 30.59 kg/m   Wt Readings from Last 3 Encounters:  08/30/22 80.8 kg (178 lb 3.2 oz)  07/26/22 82.6 kg (182 lb)  04/28/22 82.9 kg (182 lb 12.8 oz)   PHYSICAL EXAM: General:  well appearing.  No respiratory difficulty HEENT: normal Neck: supple. JVD flat. Carotids 2+ bilat; no bruits. No lymphadenopathy or thyromegaly appreciated. Cor: PMI nondisplaced. Regular rate & rhythm. No rubs, gallops or murmurs. Lungs: clear Abdomen: soft, nontender, nondistended. No hepatosplenomegaly. No bruits or masses. Good bowel sounds. Extremities: no cyanosis, clubbing, rash, edema  Neuro: alert & oriented x 3, cranial nerves grossly intact. moves all 4 extremities w/o difficulty. Affect pleasant.   ASSESSMENT & PLAN: 1.  Chronic Systolic Heart Failure: Nonischemic cardiomyopathy s/p Medtronic CRT-D in 10/15, coronary angiography without CAD in 07/2013. Echo done in 2/16, EF 25-30%.  Repeat echo in 8/16 with EF up to 35-40%.  Echo in 4/19 showed EF up to 45-50%.  However, echo in 4/21 showed EF back down to 30-35%.  LHC/RHC in 5/21 showed no CAD, normal filling pressures, preserved cardiac output.  Of note, Medtronic device had been reprogrammed to VVI as LBBB has been only intermittent => more recently, LBBB has been persistent and CRT was reactivated in 9/21. Echo 3/23 showed EF 35-40%, diffuse hypokinesis, mildly decreased RV systolic function.  Echo (3/24) showed EF 35-40%. NYHA class II symptoms.  Not volume overloaded by exam or Optivol. Weight down another 4lbs.  - Decrease Coreg 6.25>3.125 mg bid. BP soft and recurrent dizziness.  - Continue Entresto 49/51 bid.  - Decrease Lasix to 20 mg daily + decrease KCL to daily. Patient aware  and verbalized how to take additional lasix + KDUR if needed (weight gain) - Continue spironolactone 25 mg daily.  - Continue dapagliflozin 10 mg daily.  No GU symptoms - Add compression socks - "Syncopal" episode last month suspect from dehydration, hypotension? and heat. No AT/AF on device interrogation. Resolved with PO intake. Med changes as above. - BMET, BNP today 2. Hx of CVA: ?Embolic.  No atrial fibrillation has been detected so far on device.  She will continue Plavix for now. If future device interrogation shows atrial fibrillation, she will need to be anticoagulated.   3. Hyperlipidemia: Goal LDL < 70 with history of CVA.  Good lipids 3/24. - Continue statin.   Follow up in 2-3 months with APP.  Alen Bleacher AGACNP-BC  08/30/2022

## 2022-08-23 NOTE — Progress Notes (Signed)
EPIC Encounter for ICM Monitoring  Patient Name: [name] is a 72 y.o. female Date: 08/23/2022 Primary Care Physican: Storm Frisk, MD Primary Cardiologist: Shirlee Latch Electrophysiologist: Joycelyn Schmid Pacing: 97.6%         04/28/2022 Office Weight: 182 lbs   Battery Longevity: 2 months                                                          Transmission results reviewed.     Optivol thoracic impedance suggesting normal fluid levels within the last month.     Prescribed:  Furosemide 40 mg take 1 tablet by mouth daily Potassium 10 mEq take 4 tablets by mouth daily    Labs: 03/30/2022 Creatinine 0.95, BUN 10, Potassium 3.7, Sodium 140, GFR >60 03/22/2022 Creatinine 0.97, BUN 11, Potassium 2.3, Sodium 140, GFR >60 (low potassium has been addressed by Dr Shirlee Latch on 3/6) 01/30/2022 Creatinine 0.99, BUN 12, Potassium 3.7, Sodium 140  01/25/2022 Creatinine 0.99, BUN 11, Potassium 2.7, Sodium 139  A complete set of results can be found in Results Review.   Recommendations:  No changes.   Follow-up plan: ICM clinic phone appointment on 09/25/2022.   91 day device clinic remote transmission 10/17/2022.   EP/Cardiology Office Visits: 08/30/2022 with HF clinic.   Recall 10/25/2022 with Dr Graciela Husbands.   Copy of ICM check sent to Dr. Graciela Husbands.   3 month ICM trend: 08/21/2022.    12-14 Month ICM trend:     Karie Soda, RN 08/23/2022 4:07 PM

## 2022-08-30 ENCOUNTER — Encounter (HOSPITAL_COMMUNITY): Payer: Self-pay

## 2022-08-30 ENCOUNTER — Telehealth (HOSPITAL_COMMUNITY): Payer: Self-pay

## 2022-08-30 ENCOUNTER — Ambulatory Visit (HOSPITAL_COMMUNITY)
Admission: RE | Admit: 2022-08-30 | Discharge: 2022-08-30 | Disposition: A | Discharge status: Home / Self Care | Payer: 59 | Source: Ambulatory Visit | Attending: Internal Medicine | Admitting: Internal Medicine

## 2022-08-30 VITALS — BP 98/56 | HR 79 | Wt 178.2 lb

## 2022-08-30 DIAGNOSIS — Z95810 Presence of automatic (implantable) cardiac defibrillator: Secondary | ICD-10-CM | POA: Diagnosis not present

## 2022-08-30 DIAGNOSIS — Z8673 Personal history of transient ischemic attack (TIA), and cerebral infarction without residual deficits: Secondary | ICD-10-CM | POA: Diagnosis not present

## 2022-08-30 DIAGNOSIS — Z7902 Long term (current) use of antithrombotics/antiplatelets: Secondary | ICD-10-CM | POA: Diagnosis not present

## 2022-08-30 DIAGNOSIS — Z79899 Other long term (current) drug therapy: Secondary | ICD-10-CM | POA: Insufficient documentation

## 2022-08-30 DIAGNOSIS — I447 Left bundle-branch block, unspecified: Secondary | ICD-10-CM | POA: Insufficient documentation

## 2022-08-30 DIAGNOSIS — I5022 Chronic systolic (congestive) heart failure: Secondary | ICD-10-CM

## 2022-08-30 DIAGNOSIS — I428 Other cardiomyopathies: Secondary | ICD-10-CM

## 2022-08-30 DIAGNOSIS — Z8249 Family history of ischemic heart disease and other diseases of the circulatory system: Secondary | ICD-10-CM | POA: Insufficient documentation

## 2022-08-30 DIAGNOSIS — E785 Hyperlipidemia, unspecified: Secondary | ICD-10-CM | POA: Insufficient documentation

## 2022-08-30 LAB — BRAIN NATRIURETIC PEPTIDE: B Natriuretic Peptide: 15.2 pg/mL (ref 0.0–100.0)

## 2022-08-30 LAB — BASIC METABOLIC PANEL
Anion gap: 9 (ref 5–15)
BUN: 43 mg/dL — ABNORMAL HIGH (ref 8–23)
CO2: 16 mmol/L — ABNORMAL LOW (ref 22–32)
Calcium: 8.9 mg/dL (ref 8.9–10.3)
Chloride: 109 mmol/L (ref 98–111)
Creatinine, Ser: 1.64 mg/dL — ABNORMAL HIGH (ref 0.44–1.00)
GFR, Estimated: 33 mL/min — ABNORMAL LOW (ref >60)
Glucose, Bld: 97 mg/dL (ref 70–99)
Potassium: 4.4 mmol/L (ref 3.5–5.1)
Sodium: 134 mmol/L — ABNORMAL LOW (ref 135–145)

## 2022-08-30 MED ORDER — POTASSIUM CHLORIDE CRYS ER 20 MEQ PO TBCR: 20.0000 meq | EXTENDED_RELEASE_TABLET | Freq: Every day | ORAL | 3 refills | Status: DC

## 2022-08-30 MED ORDER — FUROSEMIDE 40 MG PO TABS: 20.0000 mg | ORAL_TABLET | Freq: Every day | ORAL | 3 refills | Status: DC

## 2022-08-30 NOTE — Patient Instructions (Addendum)
Thank you for coming in today  If you had labs drawn today, any labs that are abnormal the clinic will call you No news is good news  You have been given a prescription for compression hose. And a list of places who supply them. Please wear your compression hose daily, place them on as soon as you get up in the morning and remove before you go to bed at night.   Medications: Decrease Lasix to 20 mg daily Decrease Potassium 20 meq daily Decrease  Coreg to 3.125 mg 1 tablet twice daily    Follow up appointments:  Your physician recommends that you schedule a follow-up appointment in:  2-3 months in clinic    Do the following things EVERYDAY: Weigh yourself in the morning before breakfast. Write it down and keep it in a log. Take your medicines as prescribed Eat low salt foods--Limit salt (sodium) to 2000 mg per day.  Stay as active as you can everyday Limit all fluids for the day to less than 2 liters   At [facility], you and your health needs are our priority. As part of our continuing mission to provide you with exceptional heart care, we have created designated Provider Care Teams. These Care Teams include your primary Cardiologist (physician) and Advanced Practice Providers (APPs- Physician Assistants and Nurse Practitioners) who all work together to provide you with the care you need, when you need it.   You may see any of the following providers on your designated Care Team at your next follow up: Dr Arvilla Meres Dr [name] Dr. Marcos Eke, NP Robbie Lis, [place] [facility] [place], [place] Brynda Peon, NP Karle Plumber, PharmD   Please be sure to bring in all your medications bottles to every appointment.    Thank you for choosing [facility]  If you have any questions or concerns before your next appointment please send [place] a message through [place] or call our office at  [phone].    TO LEAVE A MESSAGE FOR THE NURSE SELECT OPTION 2, PLEASE LEAVE A MESSAGE INCLUDING: YOUR NAME DATE OF BIRTH CALL BACK NUMBER REASON FOR CALL**this is important as we prioritize the call backs  YOU WILL RECEIVE A CALL BACK THE SAME DAY AS LONG AS YOU CALL BEFORE 4:00 PM

## 2022-08-30 NOTE — Telephone Encounter (Signed)
Patient's lab orders has been placed and her appointment scheduled. In addition, patient will hold the below medications for 3 days as requested. Pt is aware, agreeable, and verbalized understanding.

## 2022-08-30 NOTE — Telephone Encounter (Signed)
-----   Message from Alen Bleacher sent at 08/30/2022 11:18 AM EDT ----- Renal function elevated, suspect secondary to dehydration. Hold Cleda Daub, farxiga, lasix and KDUR for 3 days. Repeat BMET in 1 week.

## 2022-09-05 NOTE — Progress Notes (Signed)
Carelink Summary Report / Loop Recorder 

## 2022-09-05 NOTE — Addendum Note (Signed)
Addended by: Geralyn Flash D on: 09/05/2022 11:52 AM   Modules accepted: Level of Service

## 2022-09-06 ENCOUNTER — Ambulatory Visit (HOSPITAL_COMMUNITY)
Admission: RE | Admit: 2022-09-06 | Discharge: 2022-09-06 | Disposition: A | Discharge status: Home / Self Care | Payer: 59 | Source: Ambulatory Visit | Attending: Internal Medicine | Admitting: Internal Medicine

## 2022-09-06 DIAGNOSIS — I5022 Chronic systolic (congestive) heart failure: Secondary | ICD-10-CM | POA: Insufficient documentation

## 2022-09-06 LAB — BASIC METABOLIC PANEL
Anion gap: 8 (ref 5–15)
BUN: 8 mg/dL (ref 8–23)
CO2: 20 mmol/L — ABNORMAL LOW (ref 22–32)
Calcium: 8.6 mg/dL — ABNORMAL LOW (ref 8.9–10.3)
Chloride: 108 mmol/L (ref 98–111)
Creatinine, Ser: 0.96 mg/dL (ref 0.44–1.00)
GFR, Estimated: >60 mL/min (ref >60)
Glucose, Bld: 87 mg/dL (ref 70–99)
Potassium: 3.9 mmol/L (ref 3.5–5.1)
Sodium: 136 mmol/L (ref 135–145)

## 2022-09-20 NOTE — Progress Notes (Unsigned)
Established Patient Office Visit  Subjective   Patient ID: [name], female    DOB: [birth year]  Age: 72 y.o. MRN: [id number]  No chief complaint on file.   Last seen 10/19/21 for AWV medicare This is a 72 year old female who has nonischemic cardiomyopathy mitral regurgitation ICD and pacemaker in place, reflux, previous cerebral infarction due to embolism, stage III kidney disease, hyperkalemia in the past, and obesity.  Patient required hospitalization in November since I saw her previously for a Medicare wellness visit in October.  Below is the discharge summary Hosp in November 2023   Discharge Diagnoses: Principal Problem:   Acute kidney injury superimposed on chronic kidney disease (HCC) Active Problems:   Heart failure with reduced ejection fraction (HCC)- s/p ICD and pacemaker    GERD (gastroesophageal reflux disease)   History of CVA with residual deficit   Obesity (BMI 30-39.9)   Resolved Problems:   * No resolved hospital problems. *   Hospital Course:   72 year old female with past medical history of HFrEF, hypertension, CVA, dyslipidemia, obesity presented with hyperkalemia.  Patient is dose of furosemide was increased due to volume overload with increased potassium supplements.  She started having nausea vomiting for 2 days along with diarrhea.  As outpatient was found to have hyperkalemia and was referred to hospital for further evaluation. Patient was found to have potassium of 5.8   Assessment and Plan:   Acute kidney injury superimposed on CKD stage III a -Patient baseline creatinine was 1.28 on 11/09/2021 -Creatinine today improved to 1.80 -We will discontinue IV fluids   HFrEF s/p ICD/pacemaker -No clinical signs symptoms of decompensation -Started on gentle IV fluids as above -Diuretics on hold -Discussed with heart failure team, they have seen the patient and recommend to hold diuretics, Entresto, Aldactone -Can restart taking Coreg 6.25 mg  p.o. twice a day from 11/25/2021 if systolic blood pressure remains greater than 110     History of CVA with residual deficit -Continue Plavix, atorvastatin     Obesity -BMI 31.5     Following discharge she was seen by cardiology more recently as below [facility] cardiology last note as below  [facility] primary cards  [name] is a 72 y.o. female with chronic systolic CHF with nonischemic cardiomypathy (no significant CAD) s/p Medtronic CRT-D, LBBB, mild-mod MR and CVA (09/2013).   Coronary angiography in 7/15 showed no significant CAD.  Admitted in 9/15 with right sided facial droop and expressive aphasia, CVA on MRI. Had TEE showing EF 15%.     QRS narrowed, so in 11/16 device was reprogrammed to VVI.    Echo in 12/17 showed EF up to 45-50%.  Echo in 3/19 showed EF 45-50% with moderate LVH and normal RV.    Echo was done in 4/21 showing EF decreased to 30-35% with LAD territory wall motion abnormalities.  LHC/RHC in 5/21 showed normal filling pressures and cardiac output, no significant CAD.  With persistent LBBB, CRT was activated by Dr. Graciela Husbands in 9/21.    Echo 3/23 showed EF 35-40%, diffuse hypokinesis, mildly decreased RV systolic function.    Follow up 11/09/21 volume overloaded & diuretics/GDMT adjusted. Follow up labs showed AKI and hyperkalemia, with SCr up to 5.32 and K 7.4. She had a 2-day history of N/V/D. Instructed to go to ED. She was admitted, GDMT held, given IVF and hyperkalemia corrected. AHF consulted to restart GDMT. Coreg restarted, all other GDMT held. She was discharged home, weight 184 lbs.   Today she  returns for HF follow up with her husband. We have slowly added back her GDMT over last couple of visits and she has tolerated this well. Overall feeling fine. Main complaint is leg cramps at night. She is not short of breath walking on flat ground with her cane. Denies palpitations, abnormal bleeding, CP, dizziness, edema, or PND/Orthopnea. Appetite ok. No fever or  chills. Weight at home 185 pounds. Taking all medications.    Medtronic device interrogation: OptiVol down but creeping up, decreased thoracic impedance, no AF/VT, >99% BiV pacing, < 1 hr day/activity.  1. Chronic Systolic Heart Failure: Nonischemic cardiomyopathy s/p Medtronic CRT-D in 10/15, coronary angiography without CAD in 07/2013. Echo done in 2/16, EF 25-30%.  Repeat echo in 8/16 with EF up to 35-40%.  Echo in 4/19 showed EF up to 45-50%.  However, echo in 4/21 showed EF back down to 30-35%.  LHC/RHC in 5/21 showed no CAD, normal filling pressures, preserved cardiac output.  Of note, Medtronic device had been reprogrammed to VVI as LBBB has been only intermittent => more recently, LBBB has been persistent and CRT was reactivated in 9/21. Echo 3/23 showed EF 35-40%, diffuse hypokinesis, mildly decreased RV systolic function.  Improved NYHA II-early III symptoms, she is not overloaded by exam but OptiVol slowly tending up. GDMT limited by recent AKI and hyperkalemia. - Continue Farxiga 10 mg daily. BMET and BNP today. - Continue Coreg 3.125 mg bid.  - Continue spironolactone 25 mg daily. - Continue Entresto 49/51 mg bid. - Continue Lasix 40 mg daily. Can take extra PRN. 2. CVA: ?Embolic.  No atrial fibrillation has been detected so far on device.  She will continue Plavix for now. If future device interrogation shows atrial fibrillation, she will need to be anticoagulated.   3. Hyperlipidemia: Goal LDL < 70 with history of CVA. Continue statin.  - Good lipids 3/23. 4. H/o Gastroenteritis: Resolved. Instructed her to hold GDMT if this recurs and call clinic for further medication instructions. 5. SDOH: Engage HFSW to discuss meals on wheels, transportation and information on DME for the home.   Follow up in 2 months with Dr. Shirlee Latch, as scheduled.   Anderson Malta Milford FNP-BC 01/25/2022  Today the patient is requesting a new scale and seeing if she can get Meals on Wheels.  She did have her RSV  vaccine at [facility] 2 weeks ago.  Otherwise she is fully vaccinated.  She would like refills on medications that we are covering.  Her weight is stable at 183 pounds.  The patient just had complete lab screenings with resolution of renal failure creatinine now less than 1 and potassium now normal.  03/28/22 Patient presents with acute onset of off going on for 3 days she coughs to the point where she will have emesis.  She denies heartburn no shortness of breath or chest pain.  She was just seen by cardiology as documented  Acute ov coughing Cards 03/22/22 ASSESSMENT & PLAN: 1. Chronic Systolic Heart Failure: Nonischemic cardiomyopathy s/p Medtronic CRT-D in 10/15, coronary angiography without CAD in 07/2013. Echo done in 2/16, EF 25-30%.  Repeat echo in 8/16 with EF up to 35-40%.  Echo in 4/19 showed EF up to 45-50%.  However, echo in 4/21 showed EF back down to 30-35%.  LHC/RHC in 5/21 showed no CAD, normal filling pressures, preserved cardiac output.  Of note, Medtronic device had been reprogrammed to VVI as LBBB has been only intermittent => more recently, LBBB has been persistent and CRT was  reactivated in 9/21. Echo 3/23 showed EF 35-40%, diffuse hypokinesis, mildly decreased RV systolic function.  NYHA class II symptoms.  Not volume overloaded by exam or Optivol. Weight down 1 lb.  - Increase Coreg to 6.25 mg bid.  - Continue Entresto 49/51 bid. BMET/BNP today.     - Continue Lasix 40 mg daily.  - Will likely need to restart K, check BMET today as above.   - Continue spironolactone 25 mg daily.  - Continue dapagliflozin 10 mg daily.   2. CVA: ?Embolic.  No atrial fibrillation has been detected so far on device.  She will continue Plavix for now. If future device interrogation shows atrial fibrillation, she will need to be anticoagulated.   3. Hyperlipidemia: Goal LDL < 70 with history of CVA.  Continue statin.  - Check lipids today.   9/5 Per Chf clinic 08/2022 Chronic Systolic Heart Failure:  Nonischemic cardiomyopathy s/p Medtronic CRT-D in 10/15, coronary angiography without CAD in 07/2013. Echo done in 2/16, EF 25-30%.  Repeat echo in 8/16 with EF up to 35-40%.  Echo in 4/19 showed EF up to 45-50%.  However, echo in 4/21 showed EF back down to 30-35%.  LHC/RHC in 5/21 showed no CAD, normal filling pressures, preserved cardiac output.  Of note, Medtronic device had been reprogrammed to VVI as LBBB has been only intermittent => more recently, LBBB has been persistent and CRT was reactivated in 9/21. Echo 3/23 showed EF 35-40%, diffuse hypokinesis, mildly decreased RV systolic function.  Echo (3/24) showed EF 35-40%. NYHA class II symptoms.  Not volume overloaded by exam or Optivol. Weight down another 4lbs.  - Decrease Coreg 6.25>3.125 mg bid. BP soft and recurrent dizziness.  - Continue Entresto 49/51 bid.  - Decrease Lasix to 20 mg daily + decrease KCL to daily. Patient aware and verbalized how to take additional lasix + KDUR if needed (weight gain) - Continue spironolactone 25 mg daily.  - Continue dapagliflozin 10 mg daily.  No GU symptoms - Add compression socks - "Syncopal" episode last month suspect from dehydration, hypotension? and heat. No AT/AF on device interrogation. Resolved with PO intake. Med changes as above. - BMET, BNP today 2. Hx of CVA: ?Embolic.  No atrial fibrillation has been detected so far on device.  She will continue Plavix for now. If future device interrogation shows atrial fibrillation, she will need to be anticoagulated.   3. Hyperlipidemia: Goal LDL < 70 with history of CVA.  Good lipids 3/24. - Continue statin.       Review of Systems  Constitutional:  Negative for chills, diaphoresis, fever, malaise/fatigue and weight loss.  HENT:  Positive for congestion and ear discharge. Negative for ear pain, hearing loss, nosebleeds, sinus pain, sore throat and tinnitus.   Eyes:  Negative for blurred vision, photophobia and redness.  Respiratory:   Positive for cough and wheezing. Negative for hemoptysis, sputum production, shortness of breath and stridor.   Cardiovascular:  Negative for chest pain, palpitations, orthopnea, claudication, leg swelling and PND.  Gastrointestinal:  Negative for abdominal pain, blood in stool, constipation, diarrhea, heartburn, nausea and vomiting.  Genitourinary:  Negative for dysuria, flank pain, frequency, hematuria and urgency.  Musculoskeletal:  Negative for back pain, falls, joint pain, myalgias and neck pain.  Skin:  Negative for itching and rash.  Neurological:  Negative for dizziness, tingling, tremors, sensory change, speech change, focal weakness, seizures, loss of consciousness, weakness and headaches.  Endo/Heme/Allergies:  Negative for environmental allergies and polydipsia. Does not  bruise/bleed easily.  Psychiatric/Behavioral:  Negative for depression, memory loss, substance abuse and suicidal ideas. The patient is not nervous/anxious and does not have insomnia.       Objective:     There were no vitals taken for this visit.   Physical Exam Vitals reviewed.  Constitutional:      Appearance: Normal appearance. She is well-developed. She is obese. She is not diaphoretic.  HENT:     Head: Normocephalic and atraumatic.     Right Ear: Tympanic membrane normal.     Left Ear: Tympanic membrane normal.     Nose: Nose normal. No nasal deformity, septal deviation, mucosal edema, congestion or rhinorrhea.     Right Sinus: No maxillary sinus tenderness or frontal sinus tenderness.     Left Sinus: No maxillary sinus tenderness or frontal sinus tenderness.     Mouth/Throat:     Mouth: Mucous membranes are moist.     Pharynx: Oropharynx is clear. No oropharyngeal exudate or posterior oropharyngeal erythema.  Eyes:     General: No scleral icterus.    Conjunctiva/sclera: Conjunctivae normal.     Pupils: Pupils are equal, round, and reactive to light.  Neck:     Thyroid: No thyromegaly.      Vascular: No carotid bruit or JVD.     Trachea: Trachea normal. No tracheal tenderness or tracheal deviation.  Cardiovascular:     Rate and Rhythm: Normal rate and regular rhythm.     Chest Wall: PMI is not displaced.     Pulses: Normal pulses. No decreased pulses.     Heart sounds: Normal heart sounds, S1 normal and S2 normal. Heart sounds not distant. No murmur heard.    No systolic murmur is present.     No diastolic murmur is present.     No friction rub. No gallop. No S3 or S4 sounds.  Pulmonary:     Effort: Pulmonary effort is normal. No tachypnea, accessory muscle usage or respiratory distress.     Breath sounds: No stridor. No decreased breath sounds, wheezing, rhonchi or rales.  Chest:     Chest wall: No tenderness.  Abdominal:     General: Bowel sounds are normal. There is no distension.     Palpations: Abdomen is soft. Abdomen is not rigid.     Tenderness: There is no abdominal tenderness. There is no guarding or rebound.  Musculoskeletal:        General: Normal range of motion.     Cervical back: Normal range of motion and neck supple. No edema, erythema or rigidity. No muscular tenderness. Normal range of motion.  Lymphadenopathy:     Head:     Right side of head: No submental or submandibular adenopathy.     Left side of head: No submental or submandibular adenopathy.     Cervical: No cervical adenopathy.  Skin:    General: Skin is warm and dry.     Coloration: Skin is not pale.     Findings: No rash.     Nails: There is no clubbing.  Neurological:     Mental Status: She is alert and oriented to person, place, and time.     Sensory: No sensory deficit.  Psychiatric:        Speech: Speech normal.        Behavior: Behavior normal.     No results found for any visits on 09/21/22.    The ASCVD Risk score (Arnett DK, et al., 2019) failed to calculate for  the following reasons:   The patient has a prior MI or stroke diagnosis    Assessment & Plan:   Problem  List Items Addressed This Visit   None  Multiple systems assessed 35 minutes spent  No follow-ups on file.    Shan Levans, MD

## 2022-09-21 ENCOUNTER — Ambulatory Visit: Payer: 59 | Attending: Critical Care Medicine | Admitting: Critical Care Medicine

## 2022-09-21 ENCOUNTER — Encounter: Payer: Self-pay | Admitting: Critical Care Medicine

## 2022-09-21 VITALS — BP 110/70 | HR 67 | Ht 64.0 in | Wt 181.6 lb

## 2022-09-21 DIAGNOSIS — Z23 Encounter for immunization: Secondary | ICD-10-CM | POA: Diagnosis not present

## 2022-09-21 DIAGNOSIS — Z0000 Encounter for general adult medical examination without abnormal findings: Secondary | ICD-10-CM

## 2022-09-21 DIAGNOSIS — I428 Other cardiomyopathies: Secondary | ICD-10-CM | POA: Diagnosis not present

## 2022-09-21 NOTE — Patient Instructions (Signed)
No change in the medications Flu vaccine given Return new Primary care in 5 months sooner if needed

## 2022-09-21 NOTE — Assessment & Plan Note (Signed)
Declines mammogram

## 2022-09-21 NOTE — Assessment & Plan Note (Signed)
Management per heart failure

## 2022-09-21 NOTE — Progress Notes (Signed)
Flu vaccine

## 2022-10-02 NOTE — Progress Notes (Signed)
No ICM remote transmission received for 09/25/2022 and next ICM transmission scheduled for 10/09/2022.

## 2022-10-09 ENCOUNTER — Ambulatory Visit: Payer: 59 | Attending: Internal Medicine

## 2022-10-09 DIAGNOSIS — Z95810 Presence of automatic (implantable) cardiac defibrillator: Secondary | ICD-10-CM | POA: Diagnosis not present

## 2022-10-09 DIAGNOSIS — I5022 Chronic systolic (congestive) heart failure: Secondary | ICD-10-CM

## 2022-10-10 ENCOUNTER — Telehealth: Payer: Self-pay

## 2022-10-10 NOTE — Progress Notes (Signed)
EPIC Encounter for ICM Monitoring  Patient Name: [name] is a 72 y.o. female Date: 10/10/2022 Primary Care Physican: Storm Frisk, MD Primary Cardiologist: Shirlee Latch Electrophysiologist: Joycelyn Schmid Pacing: 97.3%         814/2024 Office Weight: 178 lbs   Battery Longevity: 1 month                                                          Attempted call to patient and unable to reach.  Transmission reviewed.    Optivol thoracic impedance suggesting possible fluid accumulation starting 8/14 (correlates with decreased Lasix dosage).     Prescribed:  Furosemide take 0.5 tablet (20 mg total) by mouth daily.  Decreased from 40 mg take 1 tablet daily on 8/14. Potassium 20 mEq take 1 tablets(s) by mouth daily  Spironolactone 25 mg take 1 tablet    Labs: 09/06/2022 Creatinine 0.96, BUN 8,   Potassium 3.9, Sodium 136, GFR >60 08/30/2022 Creatinine 1.64, BUN 43, Potassium 4.4, Sodium 134, GFR 33 03/30/2022 Creatinine 0.95, BUN 10, Potassium 3.7, Sodium 140, GFR >60 03/22/2022 Creatinine 0.97, BUN 11, Potassium 2.3, Sodium 140, GFR >60 (low potassium has been addressed by Dr Shirlee Latch on 3/6) 01/30/2022 Creatinine 0.99, BUN 12, Potassium 3.7, Sodium 140  01/25/2022 Creatinine 0.99, BUN 11, Potassium 2.7, Sodium 139  A complete set of results can be found in Results Review.   Recommendations:  Unable to reach.  Will send copy to HF clinic for review if patient is reached.    Follow-up plan: ICM clinic phone appointment on 10/23/2022 to recheck fluid levels.   91 day device clinic remote transmission 10/17/2022.   EP/Cardiology Office Visits: 11/07/2022 with HF clinic.   10/30/2022 with Otilio Saber, PA   Copy of ICM check sent to Dr. Graciela Husbands.   3 month ICM trend: 10/09/2022.    12-14 Month ICM trend:     Karie Soda, RN 10/10/2022 1:38 PM

## 2022-10-10 NOTE — Telephone Encounter (Signed)
Remote ICM transmission received.  Attempted call to patient regarding ICM remote transmission and no answer or voice mail box set up ? ?

## 2022-10-23 ENCOUNTER — Other Ambulatory Visit: Payer: Self-pay | Admitting: Critical Care Medicine

## 2022-10-23 ENCOUNTER — Ambulatory Visit (INDEPENDENT_AMBULATORY_CARE_PROVIDER_SITE_OTHER): Payer: 59

## 2022-10-23 DIAGNOSIS — Z95810 Presence of automatic (implantable) cardiac defibrillator: Secondary | ICD-10-CM

## 2022-10-23 DIAGNOSIS — I5022 Chronic systolic (congestive) heart failure: Secondary | ICD-10-CM

## 2022-10-27 ENCOUNTER — Telehealth: Payer: Self-pay

## 2022-10-27 NOTE — Progress Notes (Signed)
Electrophysiology Office Note:   ID:  [name], DOB 12-30-1950, MRN [id number]  Primary Cardiologist: None Electrophysiologist: Sherryl Manges, MD      History of Present Illness:   [name] is a 72 y.o. female with h/o NICM, intermittent LBBB, and CHF seen today for routine electrophysiology followup.   Since last being seen in our clinic the patient reports doing OK overall.  she denies chest pain, palpitations, dyspnea, PND, orthopnea, nausea, vomiting, dizziness, syncope, edema, weight gain, or early satiety.   Review of systems complete and found to be negative unless listed in HPI.   EP Information / Studies Reviewed:    EKG is ordered today. Personal review as below.  EKG Interpretation Date/Time:  Monday October 30 2022 09:23:19 EDT Ventricular Rate:  91 PR Interval:  122 QRS Duration:  116 QT Interval:  376 QTC Calculation: 462 R Axis:   102  Text Interpretation: Atrial-sensed ventricular-paced rhythm Biventricular pacemaker detected Confirmed by Maxine Glenn [id number]) on 10/30/2022 9:29:58 AM    ICD Interrogation-  reviewed in detail today,  See PACEART report.  Device History: Medtronic BiV ICD implanted 2015 for CHF and LBBB  2022-- Multiple reprogramming's were attempted. Unfortunately at 3.5 V every vector attempted resulted in diaphragmatic stimulation. We then elected 1-4 at 1.5 V at 1.0 ms. This resulted in narrowing of the QRS by about 20 ms. At no point could I get QRS upright in lead V1 or negative in lead I without diaphragmatic stimulation   Physical Exam:   VS:  BP 90/60   Pulse 91   Ht 5' 4.5" (1.638 m)   Wt 181 lb (82.1 kg)   BMI 30.59 kg/m    Wt Readings from Last 3 Encounters:  10/30/22 181 lb (82.1 kg)  09/21/22 181 lb 9.6 oz (82.4 kg)  08/30/22 178 lb 3.2 oz (80.8 kg)     GEN: Well nourished, well developed in no acute distress NECK: No JVD; No carotid bruits CARDIAC: Regular rate and rhythm, no murmurs, rubs,  gallops RESPIRATORY:  Clear to auscultation without rales, wheezing or rhonchi  ABDOMEN: Soft, non-tender, non-distended EXTREMITIES:  No edema; No deformity   ASSESSMENT AND PLAN:    Chronic systolic dysfunction s/p Medtronic CRT-D  NICM LBBB Pt has 1 month to ERI. Prefers to come back prior to scheduling gen change, as she has not yet hit ERI. Discussed that testing today may shorten duration.   We briefly reviewed risks of gen change, and she is agreeable to proceeding once ERI is met.  euvolemic today Stable on an appropriate medical regimen Normal ICD function See Pace Art report No changes today LV amplitude is right around her threshold of 1.25V @ 0.5 ms.  She has been very symptomatic with diaphragmatic stim with any higher amplitude, so not changed today.   Hypotension H/o AKI in August.  She declined labs today as she will require prior to gen change.  She has some dizziness on days when she gets "overheated"  Encouraged hydration and following BP closely, may ultimately need to decrease Entresto.   Prior stroke with residual weakness Stable.   Disposition:   Follow up with EP APP in 4-6 weeks to set up for gen change.    Signed, Graciella Freer, PA-C

## 2022-10-27 NOTE — Progress Notes (Signed)
EPIC Encounter for ICM Monitoring  Patient Name: [name] is a 72 y.o. female Date: 10/27/2022 Primary Care Physican: Storm Frisk, MD Primary Cardiologist: Shirlee Latch Electrophysiologist: Joycelyn Schmid Pacing: 97.8%         08/30/2022 Office Weight: 178 lbs   Battery Longevity: 1 month                                                          Attempted call to patient and unable to reach.  Transmission reviewed.    Optivol thoracic impedance suggesting ongoing possible fluid accumulation starting 8/14 (correlates with decreased Lasix dosage).     Prescribed:  Furosemide take 0.5 tablet (20 mg total) by mouth daily.  Decreased from 40 mg take 1 tablet daily on 8/14. Potassium 20 mEq take 1 tablets(s) by mouth daily  Spironolactone 25 mg take 1 tablet    Labs: 09/06/2022 Creatinine 0.96, BUN 8,   Potassium 3.9, Sodium 136, GFR >60 08/30/2022 Creatinine 1.64, BUN 43, Potassium 4.4, Sodium 134, GFR 33 03/30/2022 Creatinine 0.95, BUN 10, Potassium 3.7, Sodium 140, GFR >60 03/22/2022 Creatinine 0.97, BUN 11, Potassium 2.3, Sodium 140, GFR >60 (low potassium has been addressed by Dr Shirlee Latch on 3/6) 01/30/2022 Creatinine 0.99, BUN 12, Potassium 3.7, Sodium 140  01/25/2022 Creatinine 0.99, BUN 11, Potassium 2.7, Sodium 139  A complete set of results can be found in Results Review.   Recommendations:  Unable to reach.  Will send copy to HF clinic for review if patient is reached.    Follow-up plan: ICM clinic phone appointment on 11/01/2022 to recheck fluid levels.   91 day device clinic remote transmission pending battery replacement.   EP/Cardiology Office Visits: 11/07/2022 with HF clinic.   10/30/2022 with Otilio Saber, PA   Copy of ICM check sent to Dr. Graciela Husbands.   3 month ICM trend: 10/25/2022.    12-14 Month ICM trend:     Karie Soda, RN 10/27/2022 9:58 AM

## 2022-10-27 NOTE — Telephone Encounter (Signed)
Remote ICM transmission received.  Attempted call to patient regarding ICM remote transmission and voice mail box is not set up.    

## 2022-10-30 ENCOUNTER — Ambulatory Visit: Payer: 59 | Admitting: Student

## 2022-10-30 ENCOUNTER — Ambulatory Visit: Payer: 59 | Attending: Student

## 2022-10-30 ENCOUNTER — Encounter: Payer: Self-pay | Admitting: Student

## 2022-10-30 VITALS — BP 90/60 | HR 91 | Ht 64.5 in | Wt 181.0 lb

## 2022-10-30 DIAGNOSIS — I428 Other cardiomyopathies: Secondary | ICD-10-CM | POA: Diagnosis not present

## 2022-10-30 DIAGNOSIS — I5022 Chronic systolic (congestive) heart failure: Secondary | ICD-10-CM

## 2022-10-30 DIAGNOSIS — Z95810 Presence of automatic (implantable) cardiac defibrillator: Secondary | ICD-10-CM | POA: Diagnosis not present

## 2022-10-30 DIAGNOSIS — Z8673 Personal history of transient ischemic attack (TIA), and cerebral infarction without residual deficits: Secondary | ICD-10-CM | POA: Diagnosis not present

## 2022-10-30 LAB — CUP PACEART INCLINIC DEVICE CHECK
Battery Remaining Longevity: 1 mo
Battery Voltage: 2.72 V
Brady Statistic AP VP Percent: 0 %
Brady Statistic AP VS Percent: 0 %
Brady Statistic AS VP Percent: 98.49 %
Brady Statistic AS VS Percent: 1.51 %
Brady Statistic RA Percent Paced: 0 %
Brady Statistic RV Percent Paced: 3.4 %
Date Time Interrogation Session: 20241014101048
HighPow Impedance: 90 Ohm
Implantable Lead Connection Status: 753985
Implantable Lead Connection Status: 753985
Implantable Lead Connection Status: 753985
Implantable Lead Implant Date: 20151021
Implantable Lead Implant Date: 20151021
Implantable Lead Implant Date: 20151021
Implantable Lead Location: 753858
Implantable Lead Location: 753859
Implantable Lead Location: 753860
Implantable Lead Model: 4298
Implantable Lead Model: 5076
Implantable Pulse Generator Implant Date: 20151021
Lead Channel Impedance Value: 1121 Ohm
Lead Channel Impedance Value: 1140 Ohm
Lead Channel Impedance Value: 1140 Ohm
Lead Channel Impedance Value: 1235 Ohm
Lead Channel Impedance Value: 1292 Ohm
Lead Channel Impedance Value: 475 Ohm
Lead Channel Impedance Value: 532 Ohm
Lead Channel Impedance Value: 551 Ohm
Lead Channel Impedance Value: 551 Ohm
Lead Channel Impedance Value: 703 Ohm
Lead Channel Impedance Value: 703 Ohm
Lead Channel Impedance Value: 779 Ohm
Lead Channel Impedance Value: 836 Ohm
Lead Channel Pacing Threshold Amplitude: 0.5 V
Lead Channel Pacing Threshold Amplitude: 0.625 V
Lead Channel Pacing Threshold Amplitude: 1.125 V
Lead Channel Pacing Threshold Pulse Width: 0.4 ms
Lead Channel Pacing Threshold Pulse Width: 0.4 ms
Lead Channel Pacing Threshold Pulse Width: 0.5 ms
Lead Channel Sensing Intrinsic Amplitude: 10.5 mV
Lead Channel Sensing Intrinsic Amplitude: 3 mV
Lead Channel Sensing Intrinsic Amplitude: 4.125 mV
Lead Channel Sensing Intrinsic Amplitude: 5.125 mV
Lead Channel Setting Pacing Amplitude: 1.25 V
Lead Channel Setting Pacing Amplitude: 1.5 V
Lead Channel Setting Pacing Amplitude: 2 V
Lead Channel Setting Pacing Pulse Width: 0.4 ms
Lead Channel Setting Pacing Pulse Width: 1 ms
Lead Channel Setting Sensing Sensitivity: 0.3 mV
Zone Setting Status: 755011

## 2022-10-30 NOTE — Patient Instructions (Signed)
Medication Instructions:  Your physician recommends that you continue on your current medications as directed. Please refer to the Current Medication list given to you today.  *If you need a refill on your cardiac medications before your next appointment, please call your pharmacy*  Lab Work: None ordered If you have labs (blood work) drawn today and your tests are completely normal, you will receive your results only by: MyChart Message (if you have MyChart) OR A paper copy in the mail If you have any lab test that is abnormal or we need to change your treatment, we will call you to review the results.  Follow-Up: At [facility], you and your health needs are our priority.  As part of our continuing mission to provide you with exceptional heart care, we have created designated Provider Care Teams.  These Care Teams include your primary Cardiologist (physician) and Advanced Practice Providers (APPs -  Physician Assistants and Nurse Practitioners) who all work together to provide you with the care you need, when you need it.  Your next appointment:   4-6 week(s)  Provider:   Casimiro Needle "Otilio Saber, PA-C

## 2022-10-31 ENCOUNTER — Telehealth: Payer: Self-pay

## 2022-10-31 DIAGNOSIS — I428 Other cardiomyopathies: Secondary | ICD-10-CM

## 2022-10-31 DIAGNOSIS — Z01812 Encounter for preprocedural laboratory examination: Secondary | ICD-10-CM

## 2022-10-31 DIAGNOSIS — I5020 Unspecified systolic (congestive) heart failure: Secondary | ICD-10-CM

## 2022-10-31 LAB — CUP PACEART REMOTE DEVICE CHECK
Battery Remaining Longevity: 1 mo
Battery Voltage: 2.71 V
Brady Statistic AP VP Percent: 0 %
Brady Statistic AP VS Percent: 0 %
Brady Statistic AS VP Percent: 98.56 %
Brady Statistic AS VS Percent: 1.43 %
Brady Statistic RA Percent Paced: 0.01 %
Brady Statistic RV Percent Paced: 4.72 %
Date Time Interrogation Session: 20241015132326
HighPow Impedance: 85 Ohm
Implantable Lead Connection Status: 753985
Implantable Lead Connection Status: 753985
Implantable Lead Connection Status: 753985
Implantable Lead Implant Date: 20151021
Implantable Lead Implant Date: 20151021
Implantable Lead Implant Date: 20151021
Implantable Lead Location: 753858
Implantable Lead Location: 753859
Implantable Lead Location: 753860
Implantable Lead Model: 4298
Implantable Lead Model: 5076
Implantable Pulse Generator Implant Date: 20151021
Lead Channel Impedance Value: 1026 Ohm
Lead Channel Impedance Value: 1083 Ohm
Lead Channel Impedance Value: 1121 Ohm
Lead Channel Impedance Value: 1140 Ohm
Lead Channel Impedance Value: 1197 Ohm
Lead Channel Impedance Value: 456 Ohm
Lead Channel Impedance Value: 513 Ohm
Lead Channel Impedance Value: 532 Ohm
Lead Channel Impedance Value: 551 Ohm
Lead Channel Impedance Value: 665 Ohm
Lead Channel Impedance Value: 665 Ohm
Lead Channel Impedance Value: 722 Ohm
Lead Channel Impedance Value: 817 Ohm
Lead Channel Pacing Threshold Amplitude: 0.5 V
Lead Channel Pacing Threshold Amplitude: 0.625 V
Lead Channel Pacing Threshold Amplitude: 1.125 V
Lead Channel Pacing Threshold Pulse Width: 0.4 ms
Lead Channel Pacing Threshold Pulse Width: 0.4 ms
Lead Channel Pacing Threshold Pulse Width: 0.5 ms
Lead Channel Sensing Intrinsic Amplitude: 10.5 mV
Lead Channel Sensing Intrinsic Amplitude: 3 mV
Lead Channel Sensing Intrinsic Amplitude: 4 mV
Lead Channel Sensing Intrinsic Amplitude: 4 mV
Lead Channel Setting Pacing Amplitude: 1.25 V
Lead Channel Setting Pacing Amplitude: 1.5 V
Lead Channel Setting Pacing Amplitude: 2 V
Lead Channel Setting Pacing Pulse Width: 0.4 ms
Lead Channel Setting Pacing Pulse Width: 1 ms
Lead Channel Setting Sensing Sensitivity: 0.3 mV
Zone Setting Status: 755011

## 2022-10-31 NOTE — Telephone Encounter (Signed)
Alert received from [employer]:  Scheduled remote reviewed. Normal device function.   RRT triggered 10/31/22, routed to triage for review.

## 2022-11-01 ENCOUNTER — Ambulatory Visit: Payer: 59 | Attending: Internal Medicine

## 2022-11-01 DIAGNOSIS — I5022 Chronic systolic (congestive) heart failure: Secondary | ICD-10-CM

## 2022-11-01 DIAGNOSIS — Z95810 Presence of automatic (implantable) cardiac defibrillator: Secondary | ICD-10-CM | POA: Diagnosis not present

## 2022-11-01 NOTE — Telephone Encounter (Signed)
Called and spoke to patient advised her device reached RRT 10/31/22. Patient agreeable to schedule for procedure and does not need apt in November. Will cancel apt.

## 2022-11-01 NOTE — Telephone Encounter (Signed)
Can we go ahead and get her on the books for a gen change date or does she need to come back in? I see she wanted to come back in 1 month although that was prior to reaching RRT.

## 2022-11-01 NOTE — Telephone Encounter (Signed)
We talked about the risks and the procedure at our office visit.   It is completely up to her!   Happy to see her again and go over it, or to just schedule the gen change at her convenience.

## 2022-11-02 ENCOUNTER — Other Ambulatory Visit (HOSPITAL_COMMUNITY): Payer: Self-pay | Admitting: Family Medicine

## 2022-11-03 NOTE — Progress Notes (Signed)
EPIC Encounter for ICM Monitoring  Patient Name: [name] is a 72 y.o. female Date: 11/03/2022 Primary Care Physican: Storm Frisk, MD Primary Cardiologist: Shirlee Latch Electrophysiologist: Joycelyn Schmid Pacing: 98%         08/30/2022 Office Weight: 178 lbs   Battery Longevity: RRT 10/15                                                          Transmission reviewed.    Optivol thoracic impedance suggesting fluid levels trending back toward baseline.     Prescribed:  Furosemide take 0.5 tablet (20 mg total) by mouth daily.  Decreased from 40 mg take 1 tablet daily on 8/14. Potassium 20 mEq take 1 tablets(s) by mouth daily  Spironolactone 25 mg take 1 tablet    Labs: 09/06/2022 Creatinine 0.96, BUN 8,   Potassium 3.9, Sodium 136, GFR >60 08/30/2022 Creatinine 1.64, BUN 43, Potassium 4.4, Sodium 134, GFR 33 03/30/2022 Creatinine 0.95, BUN 10, Potassium 3.7, Sodium 140, GFR >60 03/22/2022 Creatinine 0.97, BUN 11, Potassium 2.3, Sodium 140, GFR >60 (low potassium has been addressed by Dr Shirlee Latch on 3/6) 01/30/2022 Creatinine 0.99, BUN 12, Potassium 3.7, Sodium 140  01/25/2022 Creatinine 0.99, BUN 11, Potassium 2.7, Sodium 139  A complete set of results can be found in Results Review.   Recommendations:  Recommendations will be given at 10/22 OV.     Follow-up plan: ICM clinic phone appointment on 12/04/2022.   91 day device clinic remote transmission pending battery replacement.   EP/Cardiology Office Visits: 11/07/2022 with HF clinic.      Copy of ICM check sent to Dr. Graciela Husbands.   3 month ICM trend: 10/31/2022.    12-14 Month ICM trend:     Karie Soda, RN 11/03/2022 12:22 PM

## 2022-11-03 NOTE — Addendum Note (Signed)
Addended by: Alois Cliche on: 11/03/2022 05:18 PM   Modules accepted: Orders

## 2022-11-03 NOTE — Telephone Encounter (Signed)
Attempted phone call to pt.  Unable to leave voicemail message as pt does not have voicemail set up.

## 2022-11-03 NOTE — Progress Notes (Incomplete)
Patient ID: [name], female   DOB: 1950/04/02, 72 y.o.   MRN: [id number]     PCP: Storm Frisk, MD Neurologist: Dr. Pearlean Brownie EP: Dr. Graciela Husbands HF Cardiologist: Dr. Shirlee Latch   HPI: [name] is a 72 y.o. female with chronic systolic CHF with nonischemic cardiomypathy (no significant CAD) s/p Medtronic CRT-D, LBBB, mild-mod MR and CVA (09/2013).  Coronary angiography in 7/15 showed no significant CAD.  Admitted in 9/15 with right sided facial droop and expressive aphasia, CVA on MRI. Had TEE showing EF 15%.    QRS narrowed, so in 11/16 device was reprogrammed to VVI.   Echo 12/17: EF up to 45-50%.  Echo 3/19: EF 45-50% with moderate LVH and normal RV.   Echo 4/21 showed EF decreased to 30-35% with LAD territory WMA.  LHC/RHC in 5/21 showed normal filling pressures and cardiac output, no significant CAD.  With persistent LBBB, CRT was activated by Dr. Graciela Husbands in 9/21.   Echo 3/23: EF 35-40%, diffuse HKN, mildly decreased RV systolic function.   Echo 3/24: EF 35-40%, global HKN, mildly reduced RV systolic function.  Today she returns for HF follow up with her son and husband. Overall feeling fine. She has SOB walking further distances on flat ground. She has occasional positional dizziness, worse when she is hot. Main complaint is left knee pain, she is asking if she needs steroid injections. Denies palpitations, CP, abnormal bleeding, edema, or PND/Orthopnea. Appetite ok. No fever or chills. Weight at home 179-180 pounds. Taking all medications. Planning gen change 01/2023. Wearing compression hose. Drinks a lot of Pepsi  Medtronic device interrogation (personally reviewed): OptiVol up, thoracic impedence down, no AF, 0.5 hr/day activity, 97.8% BiV pacing  Labs (2/21): K 3.8, creatinine 0.96, LDL 105  Labs (9/21): LDL 92 Labs (12/21): K 3.7, creatinine 0.87 Labs (4/22): LDL 66 Labs (8/22): K 3.9, creatinine 1.12 Labs (1/23): K 4, creatinine 1.14 Labs (3/23): LDL 71, HDL 34 Labs  (6/23): K 5.0, creatinine 1.61 Labs (10/23): K 4.4, creatinine 1.96 Labs (1/24): K 3.7, creatinine 0.99 Labs (3/24): K 3.7, creatinine 0.95, LDL 66 Labs (8/24): K 3.9, creatinine 0.96  ECG (personally reviewed 10/30/22): a-sensed v-paced  PMH: 1. Nonischemic cardiomyopathy: Echo (7/15) with severe LV dilation, EF 25% with diffuse hypokinesis, mild to moderate MR.  RHC/ LHC 08/08/13 with RA 4, PA 23/8, PCWP 11, CI 2.52; no CAD.  TEE (9/15) with EF 15-20%, severe global hypokinesis, no LV thrombus, moderate MR, normal RV.  Echo (10/2013): EF 25-30%, grade I DD, mild AI. Medtronic CRT-D (10/15).  Echo (2/16) with EF 25-30%, diffuse hypokinesis worse in the septum, grade I diastolic dysfunction, normal RV size and systolic function, mild-moderate MR.  - Echo (8/16) with EF 35-40%, diffuse hypokinesis, mild-moderate mitral regurgitation.  - Echo (12/17): EF 45-50%, mild LVH, mild to moderate MR.  - Echo (3/19): EF 45-50% with moderate LVH, mild MR, normal RV size and systolic function.  - Echo (4/21): EF 30-35% with LAD territory wall motion abnormalities. - LHC/RHC (5/21): No significant CAD; mean RA 6, PA 31/13, mean PCWP 13, CI 3.37.  - Echo (3/23): EF 35-40%, diffuse hypokinesis, mildly decreased RV systolic function. - Echo (3/24): EF 35-40%, grade I DD, RV mildly reduced 2. CVA: 9/15 with dysarthria.  Carotids 9/15 with no significant CAD.  - Carotid dopplers (10/17): mild plaque, normal subclavians.  3. LBBB: Medtronic CRT-D (10/2013) 4. Hyperlipidemia 5. Cholelithiasis 6. Peripheral arterial dopplers (6/19): Normal  SH: lives with her son and husband.  Does not drink or smoke. Currently not working.   FH: Mom had A fib and Heart Failure. Deceased 53        Sister has lupus  ROS: All systems negative except as listed in HPI, PMH and Problem List.  Current Outpatient Medications  Medication Sig Dispense Refill   albuterol (VENTOLIN HFA) 108 (90 Base) MCG/ACT inhaler Inhale 2 puffs  into the lungs at bedtime as needed for wheezing or shortness of breath. 18 g 3   atorvastatin (LIPITOR) 80 MG tablet Take 1 tablet (80 mg total) by mouth daily at 6 PM. 90 tablet 3   carvedilol (COREG) 6.25 MG tablet Take 1 tablet (6.25 mg total) by mouth 2 (two) times daily with a meal. (Patient taking differently: Patient takes 0.5 tablet twice a day.) 180 tablet 3   cetirizine (ZYRTEC) 10 MG tablet Take 1 tablet (10 mg total) by mouth daily. 60 tablet 3   clopidogrel (PLAVIX) 75 MG tablet Take 1 tablet (75 mg total) by mouth daily. 90 tablet 3   FARXIGA 10 MG TABS tablet Take 1 tablet (10 mg total) by mouth daily before breakfast. 30 tablet 11   fluticasone (FLONASE) 50 MCG/ACT nasal spray Place 2 sprays into both nostrils daily. 16 g 1   furosemide (LASIX) 40 MG tablet Take 0.5 tablets (20 mg total) by mouth daily. (Patient taking differently: Take 40 mg by mouth daily.) 45 tablet 3   methocarbamol (ROBAXIN) 500 MG tablet Take 500 mg by mouth 3 (three) times daily as needed.     pantoprazole (PROTONIX) 40 MG tablet Take 1 tablet (40 mg total) by mouth daily. 60 tablet 3   potassium chloride SA (KLOR-CON M) 20 MEQ tablet Take 1 tablet (20 mEq total) by mouth daily. 90 tablet 3   sacubitril-valsartan (ENTRESTO) 49-51 MG Take 1 tablet by mouth 2 (two) times daily. 180 tablet 3   spironolactone (ALDACTONE) 25 MG tablet Take 1 tablet (25 mg total) by mouth daily. 90 tablet 3   No current facility-administered medications for this encounter.   BP 108/64   Pulse 70   Wt 81.9 kg (180 lb 9.6 oz)   SpO2 97%   BMI 30.52 kg/m   Wt Readings from Last 3 Encounters:  11/07/22 81.9 kg (180 lb 9.6 oz)  10/30/22 82.1 kg (181 lb)  09/21/22 82.4 kg (181 lb 9.6 oz)   PHYSICAL EXAM: General:  NAD. No resp difficulty, walked into clinic with cane HEENT: Normal Neck: Supple. JVP 10. Carotids 2+ bilat; no bruits. No lymphadenopathy or thryomegaly appreciated. Cor: PMI nondisplaced. Regular rate & rhythm.  No rubs, gallops or murmurs. Lungs: Clear Abdomen: Soft, nontender, nondistended. No hepatosplenomegaly. No bruits or masses. Good bowel sounds. Extremities: No cyanosis, clubbing, rash, edema; L knee TTP Neuro: Alert & oriented x 3, cranial nerves grossly intact. Moves all 4 extremities w/o difficulty. Affect pleasant.  ASSESSMENT & PLAN: 1. Chronic Systolic Heart Failure: Nonischemic cardiomyopathy s/p Medtronic CRT-D in 10/15, coronary angiography without CAD in 07/2013. Echo done in 2/16, EF 25-30%.  Repeat echo in 8/16 with EF up to 35-40%.  Echo in 4/19 showed EF up to 45-50%.  However, echo in 4/21 showed EF back down to 30-35%.  LHC/RHC in 5/21 showed no CAD, normal filling pressures, preserved cardiac output.  Of note, Medtronic device had been reprogrammed to VVI as LBBB has been only intermittent => more recently, LBBB has been persistent and CRT was reactivated in 9/21. Echo 3/23 showed EF 35-40%, diffuse  hypokinesis, mildly decreased RV systolic function.  Echo (3/24) showed EF 35-40%. NYHA class II-IIb symptoms, she is mildly volume overloaded by exam and OptiVol has been elevated since 09/2022. - Increase Lasix to 80 mg daily, increase KCL to 40 daily. BMET/BNP today, repeat BMET in 10 days. - Continue Coreg 3.125 mg bid.  - Continue Entresto 49/51 bid.  - Continue spironolactone 25 mg daily.  - Continue dapagliflozin 10 mg daily.  No GU symptoms - Continue compression socks - I will ask device RN to enroll in monthly fluid monitoring. 2. Hx of CVA: ?Embolic.  No atrial fibrillation has been detected so far on device.  She will continue Plavix for now. If future device interrogation shows atrial fibrillation, she will need to be anticoagulated.   - No AF on device interrogation today. 3. Hyperlipidemia: Goal LDL < 70 with history of CVA.  Good lipids 3/24. - Continue statin.  4. Chronic knee pain: Likely OA. She is requesting referral to Dr. Jennette Kettle, will arrange.  Follow up in 6  months with Dr. Shirlee Latch.  Anderson [facility] FNP-BC 11/07/2022

## 2022-11-03 NOTE — Telephone Encounter (Signed)
Spoke with pt and reviewed instructions with pt.  Pt verbalizes understanding and agrees with current plan.
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
## 2022-11-06 ENCOUNTER — Telehealth: Payer: Self-pay | Admitting: Internal Medicine

## 2022-11-06 NOTE — Telephone Encounter (Signed)
I spoke to Shari Prows who states a [employer] rep will be able to go to pts apt tomorrow 10/22 to turn off ICD alarm. PAtient was unable to come into device clinic today d/t paying transportation for multiple trips.

## 2022-11-06 NOTE — Telephone Encounter (Signed)
Patient stated her device has been going off and she is due for a battery replacement in January.  Patient wants to know next steps.

## 2022-11-07 ENCOUNTER — Ambulatory Visit (HOSPITAL_COMMUNITY)
Admission: RE | Admit: 2022-11-07 | Discharge: 2022-11-07 | Disposition: A | Discharge status: Home / Self Care | Payer: 59 | Source: Ambulatory Visit | Attending: Family Medicine | Admitting: Family Medicine

## 2022-11-07 ENCOUNTER — Encounter (HOSPITAL_COMMUNITY): Payer: Self-pay

## 2022-11-07 VITALS — BP 108/64 | HR 70 | Wt 180.6 lb

## 2022-11-07 DIAGNOSIS — Z8673 Personal history of transient ischemic attack (TIA), and cerebral infarction without residual deficits: Secondary | ICD-10-CM

## 2022-11-07 DIAGNOSIS — M25562 Pain in left knee: Secondary | ICD-10-CM | POA: Diagnosis not present

## 2022-11-07 DIAGNOSIS — G8929 Other chronic pain: Secondary | ICD-10-CM | POA: Diagnosis not present

## 2022-11-07 DIAGNOSIS — I5022 Chronic systolic (congestive) heart failure: Secondary | ICD-10-CM

## 2022-11-07 DIAGNOSIS — I428 Other cardiomyopathies: Secondary | ICD-10-CM | POA: Insufficient documentation

## 2022-11-07 DIAGNOSIS — E785 Hyperlipidemia, unspecified: Secondary | ICD-10-CM

## 2022-11-07 DIAGNOSIS — Z79899 Other long term (current) drug therapy: Secondary | ICD-10-CM | POA: Insufficient documentation

## 2022-11-07 DIAGNOSIS — R0602 Shortness of breath: Secondary | ICD-10-CM | POA: Diagnosis not present

## 2022-11-07 DIAGNOSIS — Z7902 Long term (current) use of antithrombotics/antiplatelets: Secondary | ICD-10-CM | POA: Insufficient documentation

## 2022-11-07 DIAGNOSIS — R42 Dizziness and giddiness: Secondary | ICD-10-CM | POA: Insufficient documentation

## 2022-11-07 LAB — BASIC METABOLIC PANEL
Anion gap: 10 (ref 5–15)
BUN: 12 mg/dL (ref 8–23)
CO2: 20 mmol/L — ABNORMAL LOW (ref 22–32)
Calcium: 8.8 mg/dL — ABNORMAL LOW (ref 8.9–10.3)
Chloride: 108 mmol/L (ref 98–111)
Creatinine, Ser: 1.04 mg/dL — ABNORMAL HIGH (ref 0.44–1.00)
GFR, Estimated: 57 mL/min — ABNORMAL LOW (ref >60)
Glucose, Bld: 92 mg/dL (ref 70–99)
Potassium: 3.6 mmol/L (ref 3.5–5.1)
Sodium: 138 mmol/L (ref 135–145)

## 2022-11-07 LAB — BRAIN NATRIURETIC PEPTIDE: B Natriuretic Peptide: 23.7 pg/mL (ref 0.0–100.0)

## 2022-11-07 MED ORDER — POTASSIUM CHLORIDE CRYS ER 20 MEQ PO TBCR: 40.0000 meq | EXTENDED_RELEASE_TABLET | Freq: Every day | ORAL | 3 refills | Status: DC

## 2022-11-07 MED ORDER — FUROSEMIDE 40 MG PO TABS: 80.0000 mg | ORAL_TABLET | Freq: Every day | ORAL | 3 refills | Status: DC

## 2022-11-07 MED ORDER — ENTRESTO 24-26 MG PO TABS: 1.0000 | ORAL_TABLET | Freq: Two times a day (BID) | ORAL | 6 refills | Status: DC

## 2022-11-07 NOTE — Addendum Note (Signed)
Encounter addended by: Demetrius Charity, RN on: 11/07/2022 10:06 AM  Actions taken: Order list changed, Diagnosis association updated, Clinical Note Signed

## 2022-11-07 NOTE — Addendum Note (Signed)
Encounter addended by: Jacklynn Ganong, FNP on: 11/07/2022 9:56 AM  Actions taken: Clinical Note Signed

## 2022-11-07 NOTE — Patient Instructions (Addendum)
Thank you for coming in today  If you had labs drawn today, any labs that are abnormal the clinic will call you No news is good news  You have been referred to Sports Medicine their office will call you for further appointment details  Medications: Decrease Entresto to 24/26 1 tablet twice daily Increase Lasix to 80 mg 2 tablets daily Increase Potassium to 40 meq 2 tablets daily  Follow up appointments:  Your physician recommends that you schedule a follow-up appointment in:  6 months With Dr. Earlean Shawl will receive a reminder letter in the mail a few months in advance. If you don't receive a letter, please call our office to schedule the follow-up appointment.    Do the following things EVERYDAY: Weigh yourself in the morning before breakfast. Write it down and keep it in a log. Take your medicines as prescribed Eat low salt foods--Limit salt (sodium) to 2000 mg per day.  Stay as active as you can everyday Limit all fluids for the day to less than 2 liters   At the [facility], you and your health needs are our priority. As part of our continuing mission to provide you with exceptional heart care, we have created designated Provider Care Teams. These Care Teams include your primary Cardiologist (physician) and Advanced Practice Providers (APPs- Physician Assistants and Nurse Practitioners) who all work together to provide you with the care you need, when you need it.   You may see any of the following providers on your designated Care Team at your next follow up: Dr Arvilla Meres Dr [name] Dr. Marcos Eke, NP Robbie Lis, [place] [facility] [place], [place] Brynda Peon, NP Karle Plumber, PharmD   Please be sure to bring in all your medications bottles to every appointment.    Thank you for choosing [facility]  If you have any questions or concerns before your next appointment  please send [place] a message through [place] or call our office at [phone].    TO LEAVE A MESSAGE FOR THE NURSE SELECT OPTION 2, PLEASE LEAVE A MESSAGE INCLUDING: YOUR NAME DATE OF BIRTH CALL BACK NUMBER REASON FOR CALL**this is important as we prioritize the call backs  YOU WILL RECEIVE A CALL BACK THE SAME DAY AS LONG AS YOU CALL BEFORE 4:00 PM

## 2022-11-14 ENCOUNTER — Telehealth: Payer: Self-pay

## 2022-11-14 NOTE — Telephone Encounter (Signed)
Attempted ICM Call to patient to request manual remote transmission to recheck fluid levels for HF clinic.  My chart message sent.

## 2022-11-15 NOTE — Progress Notes (Signed)
Remote ICD transmission.   

## 2022-11-17 ENCOUNTER — Other Ambulatory Visit (HOSPITAL_COMMUNITY): Payer: 59

## 2022-11-20 ENCOUNTER — Other Ambulatory Visit (HOSPITAL_COMMUNITY): Payer: 59

## 2022-11-22 ENCOUNTER — Other Ambulatory Visit: Payer: Self-pay | Admitting: Internal Medicine

## 2022-11-22 ENCOUNTER — Other Ambulatory Visit: Payer: Self-pay | Admitting: Critical Care Medicine

## 2022-11-22 NOTE — Progress Notes (Signed)
No ICM remote transmission (requested by HF clinic for recheck) received for 11/20/2022 and next ICM transmission scheduled for 12/04/2022.

## 2022-11-24 ENCOUNTER — Ambulatory Visit: Payer: 59 | Admitting: Family Medicine

## 2022-11-24 ENCOUNTER — Other Ambulatory Visit (HOSPITAL_COMMUNITY): Payer: 59

## 2022-11-27 ENCOUNTER — Ambulatory Visit (HOSPITAL_COMMUNITY)
Admission: RE | Admit: 2022-11-27 | Discharge: 2022-11-27 | Disposition: A | Discharge status: Home / Self Care | Payer: 59 | Source: Ambulatory Visit | Attending: Cardiology | Admitting: Cardiology

## 2022-11-27 DIAGNOSIS — I5022 Chronic systolic (congestive) heart failure: Secondary | ICD-10-CM | POA: Diagnosis not present

## 2022-11-27 LAB — BASIC METABOLIC PANEL
Anion gap: 6 (ref 5–15)
BUN: 12 mg/dL (ref 8–23)
CO2: 23 mmol/L (ref 22–32)
Calcium: 9.2 mg/dL (ref 8.9–10.3)
Chloride: 108 mmol/L (ref 98–111)
Creatinine, Ser: 0.96 mg/dL (ref 0.44–1.00)
GFR, Estimated: >60 mL/min (ref >60)
Glucose, Bld: 134 mg/dL — ABNORMAL HIGH (ref 70–99)
Potassium: 3.9 mmol/L (ref 3.5–5.1)
Sodium: 137 mmol/L (ref 135–145)

## 2022-12-04 ENCOUNTER — Ambulatory Visit: Payer: 59 | Attending: Internal Medicine

## 2022-12-04 ENCOUNTER — Encounter: Payer: 59 | Admitting: Student

## 2022-12-04 DIAGNOSIS — Z95810 Presence of automatic (implantable) cardiac defibrillator: Secondary | ICD-10-CM | POA: Diagnosis not present

## 2022-12-04 DIAGNOSIS — I5022 Chronic systolic (congestive) heart failure: Secondary | ICD-10-CM | POA: Diagnosis not present

## 2022-12-05 ENCOUNTER — Encounter: Payer: Self-pay | Admitting: Cardiology

## 2022-12-05 NOTE — Progress Notes (Signed)
EPIC Encounter for ICM Monitoring  Patient Name: [name] is a 72 y.o. female Date: 12/05/2022 Primary Care Physican: Storm Frisk, MD Primary Cardiologist: Shirlee Latch Electrophysiologist: Joycelyn Schmid Pacing: 97.8%         08/30/2022 Office Weight: 178 lbs   Battery Longevity: Battery Replacement scheduled for 01/18/2023                                                          Transmission reviewed.    Optivol thoracic impedance suggesting normal fluid levels since 10/28.     Prescribed:  Furosemide 40 mg take 2 tablet(s) (80 mg total) by mouth daily.   Potassium 20 mEq take 2 tablets(s) (40 mEq total) by mouth daily  Spironolactone 25 mg take 1 tablet    Labs: 11/27/2022 Creatinine 0.96, BUN 12, Potassium 3.9, Sodium 137, GFR >60  11/07/2022 Creatinine 1.04, BUN 12, Potassium 3.6, Sodium 138, GFR 57  09/06/2022 Creatinine 0.96, BUN 8,   Potassium 3.9, Sodium 136, GFR >60 08/30/2022 Creatinine 1.64, BUN 43, Potassium 4.4, Sodium 134, GFR 33 03/30/2022 Creatinine 0.95, BUN 10, Potassium 3.7, Sodium 140, GFR >60 03/22/2022 Creatinine 0.97, BUN 11, Potassium 2.3, Sodium 140, GFR >60 (low potassium has been addressed by Dr Shirlee Latch on 3/6) 01/30/2022 Creatinine 0.99, BUN 12, Potassium 3.7, Sodium 140  01/25/2022 Creatinine 0.99, BUN 11, Potassium 2.7, Sodium 139  A complete set of results can be found in Results Review.   Recommendations:  No changes.   Follow-up plan: ICM clinic phone appointment on 01/08/2023.   91 day device clinic remote transmission pending battery replacement.   EP/Cardiology Office Visits: Recall 05/06/2023 with Dr Shirlee Latch.  Pt canceled 12/04/2022 appointment with Otilio Saber PA      Copy of ICM check sent to Dr. Graciela Husbands.    3 month ICM trend: 12/04/2022.    12-14 Month ICM trend:     Karie Soda, RN 12/05/2022 4:09 PM

## 2022-12-08 ENCOUNTER — Ambulatory Visit: Payer: 59 | Admitting: Family Medicine

## 2022-12-18 ENCOUNTER — Other Ambulatory Visit (HOSPITAL_COMMUNITY): Payer: Self-pay | Admitting: Cardiology

## 2022-12-18 ENCOUNTER — Other Ambulatory Visit: Payer: Self-pay | Admitting: Critical Care Medicine

## 2022-12-18 DIAGNOSIS — I6340 Cerebral infarction due to embolism of unspecified cerebral artery: Secondary | ICD-10-CM

## 2022-12-29 ENCOUNTER — Ambulatory Visit: Payer: 59 | Admitting: Family Medicine

## 2023-01-08 DIAGNOSIS — I428 Other cardiomyopathies: Secondary | ICD-10-CM | POA: Diagnosis not present

## 2023-01-08 DIAGNOSIS — I5020 Unspecified systolic (congestive) heart failure: Secondary | ICD-10-CM | POA: Diagnosis not present

## 2023-01-08 DIAGNOSIS — Z01812 Encounter for preprocedural laboratory examination: Secondary | ICD-10-CM | POA: Diagnosis not present

## 2023-01-09 LAB — BASIC METABOLIC PANEL
BUN/Creatinine Ratio: 19 (ref 12–28)
BUN: 18 mg/dL (ref 8–27)
CO2: 17 mmol/L — ABNORMAL LOW (ref 20–29)
Calcium: 9.1 mg/dL (ref 8.7–10.3)
Chloride: 106 mmol/L (ref 96–106)
Creatinine, Ser: 0.96 mg/dL (ref 0.57–1.00)
Glucose: 94 mg/dL (ref 70–99)
Potassium: 3.9 mmol/L (ref 3.5–5.2)
Sodium: 141 mmol/L (ref 134–144)
eGFR: 62 mL/min/{1.73_m2} (ref >59)

## 2023-01-09 LAB — CBC
Hematocrit: 39.1 % (ref 34.0–46.6)
Hemoglobin: 12.5 g/dL (ref 11.1–15.9)
MCH: 27.7 pg (ref 26.6–33.0)
MCHC: 32 g/dL (ref 31.5–35.7)
MCV: 87 fL (ref 79–97)
Platelets: 297 10*3/uL (ref 150–450)
RBC: 4.51 x10E6/uL (ref 3.77–5.28)
RDW: 14.1 % (ref 11.7–15.4)
WBC: 9.4 10*3/uL (ref 3.4–10.8)

## 2023-01-11 NOTE — Progress Notes (Signed)
No ICM remote transmission received for 01/08/2023 and next ICM transmission scheduled for 02/19/2023 after battery replacement.

## 2023-01-16 ENCOUNTER — Telehealth: Payer: Self-pay | Admitting: Student

## 2023-01-16 NOTE — Telephone Encounter (Signed)
 Patient is scheduled in Cath Lab with Dr. Klwin on 01/18/23. She stated she never received the soap per her procedures instructions.   She will not be able to come to office today to pick up the soap. We are closed tomorrow. She would like to know what are her other options

## 2023-01-16 NOTE — Pre-Procedure Instructions (Signed)
 Attempted to call patient regarding procedure instructions for Thursday, unable to leave voicemail, voicemail not set up.    Instructions are as follows: Arrival time 0700 Nothing to eat or drink after midnight No meds AM of procedure Responsible person to drive you home and stay with you for 24 hrs Wash with special soap night before and morning of procedure

## 2023-01-16 NOTE — Telephone Encounter (Signed)
Patient is calling requesting an ETA on the time an RN will be coming to deliver surgical scrubs. Please advise.

## 2023-01-16 NOTE — Telephone Encounter (Signed)
Pt called in stating she is supposed to have a soap to clean with prior to procedure Thursday but she has received. Please advise.

## 2023-01-16 NOTE — Telephone Encounter (Signed)
Spoke with pt and advised per Dr Graciela Husbands, RN will drop off surgical scrub this afternoon to pt's address.  Pt verbalizes understanding and thanked Charity fundraiser for the call.

## 2023-01-16 NOTE — Telephone Encounter (Signed)
Pt received supplies and states they do not need a callback. Please advise

## 2023-01-18 ENCOUNTER — Ambulatory Visit (HOSPITAL_COMMUNITY)
Admission: RE | Admit: 2023-01-18 | Discharge: 2023-01-18 | Disposition: A | Discharge status: Home / Self Care | Payer: 59 | Attending: Internal Medicine | Admitting: Internal Medicine

## 2023-01-18 ENCOUNTER — Encounter (HOSPITAL_COMMUNITY)
Admission: RE | Disposition: A | Discharge status: Home / Self Care | Payer: 59 | Source: Home / Self Care | Attending: Internal Medicine

## 2023-01-18 ENCOUNTER — Other Ambulatory Visit (HOSPITAL_COMMUNITY): Payer: Self-pay | Admitting: Cardiology

## 2023-01-18 ENCOUNTER — Other Ambulatory Visit: Payer: Self-pay

## 2023-01-18 DIAGNOSIS — I447 Left bundle-branch block, unspecified: Secondary | ICD-10-CM | POA: Insufficient documentation

## 2023-01-18 DIAGNOSIS — I5022 Chronic systolic (congestive) heart failure: Secondary | ICD-10-CM | POA: Insufficient documentation

## 2023-01-18 DIAGNOSIS — I428 Other cardiomyopathies: Secondary | ICD-10-CM | POA: Insufficient documentation

## 2023-01-18 DIAGNOSIS — I429 Cardiomyopathy, unspecified: Secondary | ICD-10-CM

## 2023-01-18 DIAGNOSIS — Z452 Encounter for adjustment and management of vascular access device: Secondary | ICD-10-CM | POA: Diagnosis not present

## 2023-01-18 DIAGNOSIS — Z8249 Family history of ischemic heart disease and other diseases of the circulatory system: Secondary | ICD-10-CM | POA: Diagnosis not present

## 2023-01-18 DIAGNOSIS — Z4502 Encounter for adjustment and management of automatic implantable cardiac defibrillator: Secondary | ICD-10-CM | POA: Diagnosis present

## 2023-01-18 DIAGNOSIS — I69359 Hemiplegia and hemiparesis following cerebral infarction affecting unspecified side: Secondary | ICD-10-CM | POA: Insufficient documentation

## 2023-01-18 HISTORY — PX: BIV ICD GENERATOR CHANGEOUT: EP1194

## 2023-01-18 SURGERY — BIV ICD GENERATOR CHANGEOUT
Anesthesia: LOCAL

## 2023-01-18 MED ORDER — CEFAZOLIN SODIUM-DEXTROSE 2-4 GM/100ML-% IV SOLN: 2.0000 g | INTRAVENOUS | Status: AC

## 2023-01-18 MED ORDER — MIDAZOLAM HCL 2 MG/2ML IJ SOLN
INTRAMUSCULAR | Status: AC
  Filled 2023-01-18: qty 2

## 2023-01-18 MED ORDER — SODIUM CHLORIDE 0.9 % IV SOLN: INTRAVENOUS | Status: DC

## 2023-01-18 MED ORDER — CEFAZOLIN SODIUM-DEXTROSE 2-4 GM/100ML-% IV SOLN
INTRAVENOUS | Status: AC
  Administered 2023-01-18: 2 g via INTRAVENOUS
  Filled 2023-01-18: qty 100

## 2023-01-18 MED ORDER — FENTANYL CITRATE (PF) 100 MCG/2ML IJ SOLN
INTRAMUSCULAR | Status: AC
  Filled 2023-01-18: qty 2

## 2023-01-18 MED ORDER — SODIUM CHLORIDE 0.9 % IV SOLN
INTRAVENOUS | Status: AC
  Administered 2023-01-18: 80 mg
  Filled 2023-01-18: qty 2

## 2023-01-18 MED ORDER — LIDOCAINE HCL (PF) 1 % IJ SOLN
INTRAMUSCULAR | Status: AC
  Filled 2023-01-18: qty 60

## 2023-01-18 MED ORDER — FENTANYL CITRATE (PF) 100 MCG/2ML IJ SOLN
INTRAMUSCULAR | Status: DC | PRN
  Administered 2023-01-18 (×3): 25 ug via INTRAVENOUS

## 2023-01-18 MED ORDER — POVIDONE-IODINE 10 % EX SWAB
2.0000 | Freq: Once | CUTANEOUS | Status: AC
  Administered 2023-01-18: 2 via TOPICAL

## 2023-01-18 MED ORDER — LIDOCAINE HCL (PF) 1 % IJ SOLN
INTRAMUSCULAR | Status: DC | PRN
  Administered 2023-01-18: 50 mL

## 2023-01-18 MED ORDER — ACETAMINOPHEN 325 MG PO TABS: 325.0000 mg | ORAL_TABLET | ORAL | Status: DC | PRN

## 2023-01-18 MED ORDER — CHLORHEXIDINE GLUCONATE 4 % EX SOLN
4.0000 | Freq: Once | CUTANEOUS | Status: DC
  Filled 2023-01-18: qty 60

## 2023-01-18 MED ORDER — MIDAZOLAM HCL 2 MG/2ML IJ SOLN
INTRAMUSCULAR | Status: DC | PRN
  Administered 2023-01-18 (×3): 1 mg via INTRAVENOUS

## 2023-01-18 MED ORDER — SODIUM CHLORIDE 0.9 % IV SOLN: 80.0000 mg | INTRAVENOUS | Status: AC

## 2023-01-18 SURGICAL SUPPLY — 7 items
CABLE SURGICAL S-101-97-12 (CABLE) IMPLANT
DEVICE DISSECT PLASMABLAD 3.0S (MISCELLANEOUS) IMPLANT
ICD COBALT XT QUAD CRT DTPA2QQ (ICD Generator) IMPLANT
KIT WRENCH (KITS) IMPLANT
PAD DEFIB RADIO PHYSIO CONN (PAD) IMPLANT
PLASMABLADE 3.0S (MISCELLANEOUS) ×1
TRAY PACEMAKER INSERTION (PACKS) IMPLANT

## 2023-01-18 NOTE — Discharge Instructions (Signed)

## 2023-01-18 NOTE — H&P (Signed)
 Patient Care Team: Brien Belvie BRAVO, MD as PCP - General (Pulmonary Disease) Rolan Ezra RAMAN, MD as PCP - Advanced Heart Failure (Cardiology) Fernande Elspeth BROCKS, MD as PCP - Electrophysiology (Cardiology)   HPI  [name] is a 73 y.o. female dmitted for CRT Medtronic generator replacement. Shortness of breath and fatigue No chest pain or edema  She is S/p CVA without interval detection of atrial fibrillation.        DATE TEST EF    7/15 LHC   No Angiographic CAD  7 /15 Echo  20-25 %    12/17 Echo 45-50 %    3/19 Echo  45-50%    4/21 Echo  30-35%    5/21 LHC   No obstructive CAD  3/23 Echo   35-40%    3/24 Echo  35-40%             Date Cr K Hgb  4/18 0.8 3.4    10/19  0.89 4.3    6/20 0.83 3.7    6/21 0.96 3.5 10.2  11/23 0.85<<5.32 3.7<<7.4 10.0  3/24 0.95 3.7    12/*24 0.96 3.9 12.5     Records and Results Reviewed   Past Medical History:  Diagnosis Date   A-fib (HCC)    Acute kidney injury superimposed on chronic kidney disease (HCC) 11/21/2021   Arthritis    Automatic implantable cardioverter-defibrillator in situ    Cardiac resynchronization therapy defibrillator (CRT-D) in place    a. placed on 11/05/13.   GERD (gastroesophageal reflux disease)    HLD (hyperlipidemia)    Hyperkalemia 11/21/2021   Hypotension    LBBB (left bundle branch block)    Mitral regurgitation    a. Mild-mod MR by echo 07/2013.   PONV (postoperative nausea and vomiting)    Stage 3a chronic kidney disease (HCC) 02/22/2021   Stroke (HCC) 09/23/2013   Systolic CHF (HCC)    a. Dx 07/2013 - due to NICM. Cath normal cors. EF 25%.    Past Surgical History:  Procedure Laterality Date   BI-VENTRICULAR IMPLANTABLE CARDIOVERTER DEFIBRILLATOR N/A 11/05/2013   Procedure: BI-VENTRICULAR IMPLANTABLE CARDIOVERTER DEFIBRILLATOR  (CRT-D);  Surgeon: Elspeth BROCKS Fernande, MD;  Location: [facility] CATH LAB;  Service: Cardiovascular;  Laterality: N/A;   BI-VENTRICULAR IMPLANTABLE CARDIOVERTER  DEFIBRILLATOR  (CRT-D)  11/05/2013   CARDIAC CATHETERIZATION  07/2013   KNEE ARTHROSCOPY Right    LEFT AND RIGHT HEART CATHETERIZATION WITH CORONARY ANGIOGRAM N/A 08/08/2013   Procedure: LEFT AND RIGHT HEART CATHETERIZATION WITH CORONARY ANGIOGRAM;  Surgeon: Lonni JONETTA Cash, MD;  Location: [facility] CATH LAB;  Service: Cardiovascular;  Laterality: N/A;   RIGHT/LEFT HEART CATH AND CORONARY ANGIOGRAPHY N/A 06/05/2019   Procedure: RIGHT/LEFT HEART CATH AND CORONARY ANGIOGRAPHY;  Surgeon: Rolan Ezra RAMAN, MD;  Location: [facility] INVASIVE CV LAB;  Service: Cardiovascular;  Laterality: N/A;   TEE WITHOUT CARDIOVERSION N/A 09/26/2013   Procedure: TRANSESOPHAGEAL ECHOCARDIOGRAM (TEE);  Surgeon: Ezra RAMAN Rolan, MD;  Location: [facility] ENDOSCOPY;  Service: Cardiovascular;  Laterality: N/A;   TONSILLECTOMY     WRIST ARTHROSCOPY  12/21/2010   Procedure: ARTHROSCOPY WRIST;  Surgeon: Arley JONELLE Curia, MD;  Location: [facility];  Service: Orthopedics;  Laterality: Right;  right wrist, repair TFCC on right, open reconstruction ECU sheath    Current Facility-Administered Medications  Medication Dose Route Frequency Provider Last Rate Last Admin   0.9 %  sodium chloride  infusion   Intravenous Continuous Fernande Elspeth BROCKS, MD 50 mL/hr at  01/18/23 0641 New Bag at 01/18/23 0641   ceFAZolin  (ANCEF ) 2-4 GM/100ML-% IVPB            ceFAZolin  (ANCEF ) IVPB 2g/100 mL premix  2 g Intravenous On Call Fernande Elspeth BROCKS, MD       chlorhexidine  (HIBICLENS ) 4 % liquid 4 Application  4 Application Topical Once Fernande Elspeth BROCKS, MD       gentamicin  (GARAMYCIN ) 80 mg in sodium chloride  0.9 % 500 mL irrigation  80 mg Irrigation On Call Fernande Elspeth BROCKS, MD       sodium chloride  0.9 % with gentamicin  (GARAMYCIN ) ADS Med             Allergies  Allergen Reactions   Voltaren  [Diclofenac ] Rash      Social History   Tobacco Use   Smoking status: Never   Smokeless tobacco: Never  Vaping Use   Vaping status: Never Used  Substance Use  Topics   Alcohol use: No    Alcohol/week: 0.0 standard drinks of alcohol   Drug use: No     Family History  Problem Relation Age of Onset   Congestive Heart Failure Mother    Cancer Mother 24       Breast   Cancer Father 25       Oat Cell CA   Lupus Sister    Heart disease Brother      Current Meds  Medication Sig   atorvastatin  (LIPITOR) 80 MG tablet Take 1 tablet (80 mg total) by mouth daily at 6 PM.   carvedilol  (COREG ) 3.125 MG tablet Take 1 tablet (3.125 mg total) by mouth 2 (two) times daily with a meal. (Patient taking differently: Take 6.25 mg by mouth 2 (two) times daily with a meal.)   cetirizine  (ZYRTEC ) 10 MG tablet Take 1 tablet (10 mg total) by mouth daily.   clopidogrel  (PLAVIX ) 75 MG tablet Take 1 tablet (75 mg total) by mouth daily.   FARXIGA  10 MG TABS tablet Take 1 tablet (10 mg total) by mouth daily before breakfast.   fluticasone  (FLONASE ) 50 MCG/ACT nasal spray Place 2 sprays into both nostrils daily.   furosemide  (LASIX ) 40 MG tablet Take 2 tablets (80 mg total) by mouth daily. (Patient taking differently: Take 80 mg by mouth 2 (two) times daily.)   pantoprazole  (PROTONIX ) 40 MG tablet Take 1 tablet (40 mg total) by mouth daily.   potassium chloride  SA (KLOR-CON  M) 20 MEQ tablet Take 2 tablets (40 mEq total) by mouth daily. (Patient taking differently: Take 20 mEq by mouth 2 (two) times daily.)   sacubitril -valsartan  (ENTRESTO ) 24-26 MG Take 1 tablet by mouth 2 (two) times daily.   spironolactone  (ALDACTONE ) 25 MG tablet Take 1 tablet (25 mg total) by mouth daily.     Review of Systems negative except from HPI and PMH  Physical Exam BP (!) 102/58   Pulse 78   Temp 97.6 F (36.4 C) (Oral)   Resp 17   Ht 5' 5 (1.651 m)   Wt 78.9 kg   SpO2 95%   BMI 28.96 kg/m  Well developed and well nourished in no acute distress HENT normal E scleral and icterus clear Neck Supple JVP flat; carotids brisk and full Clear to ausculation Device pocket well  healed; without hematoma or erythema.  There is no tethering but tenderness  \Regular rate and rhythm, no murmurs gallops or rub Soft with active bowel sounds No clubbing cyanosis Trace Edema Alert and oriented, grossly normal motor and  sensory function Skin Warm and Dry    Assessment and  Plan Nonischemic cardio myopathy   ICD-CRT Medtronic    Congestive heart failure-chronic systolic   Left bundle branch block    Prior stroke with residual weakness  Device generator tenderness  For device generator replacement We have reviewed the benefits and risks of generator replacement.  These include but are not limited to lead fracture and infection.  The patient understands, agrees and is willing to proceed.    Will culture the pocket given tenderness

## 2023-01-19 ENCOUNTER — Encounter (HOSPITAL_COMMUNITY): Payer: Self-pay | Admitting: Internal Medicine

## 2023-01-19 ENCOUNTER — Telehealth: Payer: Self-pay

## 2023-01-19 NOTE — Telephone Encounter (Signed)
 Follow-up after same day discharge: Implant date: 01/18/2023 MD: Elspeth Sage, MD Device: ICD MDT Location: L CHEST   Wound check visit: YES 90 day MD follow-up: YES  Remote Transmission received:YES  Dressing/sling removed: YES  Confirm OAC restart on: N/A  Please continue to monitor your cardiac device site for redness, swelling, and drainage. Call the device clinic at [phone] if you experience these symptoms, fever/chills, or have questions about your device.   Remote monitoring is used to monitor your cardiac device from home. This monitoring is scheduled every 91 days by our office. It allows us  to keep an eye on the functioning of your device to ensure it is working properly.

## 2023-01-23 ENCOUNTER — Telehealth: Payer: Self-pay

## 2023-01-23 LAB — AEROBIC/ANAEROBIC CULTURE W GRAM STAIN (SURGICAL/DEEP WOUND)
Culture: NO GROWTH
Gram Stain: NONE SEEN

## 2023-01-23 NOTE — Telephone Encounter (Signed)
 Spoke with pt and advised per Dr Fernande wound cultures positive for Staph and system will have to be extracted.  Discussed with pt the need for another provider to assist with removal.  Pt will be contacted with details re: extraction.  Pt verbalized understanding and agrees with current plan.

## 2023-01-25 ENCOUNTER — Encounter: Payer: Self-pay | Admitting: Internal Medicine

## 2023-01-25 ENCOUNTER — Telehealth: Payer: Self-pay

## 2023-01-25 ENCOUNTER — Ambulatory Visit: Payer: 59 | Admitting: Internal Medicine

## 2023-01-25 ENCOUNTER — Other Ambulatory Visit: Payer: Self-pay

## 2023-01-25 VITALS — BP 101/71 | HR 83 | Temp 97.7°F | Ht 65.0 in | Wt 174.0 lb

## 2023-01-25 DIAGNOSIS — B954 Other streptococcus as the cause of diseases classified elsewhere: Secondary | ICD-10-CM | POA: Diagnosis not present

## 2023-01-25 DIAGNOSIS — T827XXA Infection and inflammatory reaction due to other cardiac and vascular devices, implants and grafts, initial encounter: Secondary | ICD-10-CM

## 2023-01-25 NOTE — Telephone Encounter (Signed)
 Secure chat received from Dr. Luiz. Per Dr. Luiz patient will need appointment for today with her and will need to be scheduled for a picc line setup. Patient scheduled with Dr. Luiz today. I have also scheduled patient with [facility] IR for picc line placement on 01/31/23 at 10:45 am Lela, CMA  will be working with Dr. Luiz today and will advise patient of the appointment and all information regarding the appointment.  Lela also advised to reach out to [facility] with IR appointment and what medication patient will be getting, so first dose can be arranged in the home.  Lucretia Pendley ONEIDA Ligas, CMA

## 2023-01-25 NOTE — Telephone Encounter (Signed)
 New OPAT orders per Dr. Drue Second, orders shared with Jeri Modena, RN at [facility] and [facility] pharmacy staff.   IR appointment: 01/31/23 @ 11am - appointment time and location provided to both patient and [facility].   First dose: in home - [facility]

## 2023-01-25 NOTE — Progress Notes (Signed)
 RFV: new visit for pacemaker pocket infection  Patient ID: [name], female   DOB: 1950-07-30, 73 y.o.   MRN: [id number]  HPI  [name] is a 73yo F with Chronic systolic dysfunction s/p Medtronic CRT-D  NICM, LBBB, and history of stroke. She underwent  CRT Medtronic generator replacement on 1/2. The patient did do pre-procedure skin wash with CHG. She states since the procedure still has some soreness from the procedure that was 1 week ago.  Dr Fernande did take a swab cx of the pocket which found few staph capitis + cx from pace maker battery change/pocket. She denied having erythema to pocket/skin redness prior to the procedure however did disclose pain to dr fernande for which prompted him to swab the pocket. The patient denies any symptoms of fever, chills, nightsweats  Outpatient Encounter Medications as of 01/25/2023  Medication Sig   albuterol  (VENTOLIN  HFA) 108 (90 Base) MCG/ACT inhaler Inhale 2 puffs into the lungs at bedtime as needed for wheezing or shortness of breath.   atorvastatin  (LIPITOR) 80 MG tablet Take 1 tablet (80 mg total) by mouth daily at 6 PM.   carvedilol  (COREG ) 3.125 MG tablet Take 1 tablet (3.125 mg total) by mouth 2 (two) times daily with a meal. (Patient taking differently: Take 6.25 mg by mouth 2 (two) times daily with a meal.)   cetirizine  (ZYRTEC ) 10 MG tablet Take 1 tablet (10 mg total) by mouth daily.   clopidogrel  (PLAVIX ) 75 MG tablet Take 1 tablet (75 mg total) by mouth daily.   FARXIGA  10 MG TABS tablet Take 1 tablet (10 mg total) by mouth daily before breakfast.   fluticasone  (FLONASE ) 50 MCG/ACT nasal spray Place 2 sprays into both nostrils daily.   furosemide  (LASIX ) 40 MG tablet Take 2 tablets (80 mg total) by mouth daily. (Patient taking differently: Take 80 mg by mouth 2 (two) times daily.)   pantoprazole  (PROTONIX ) 40 MG tablet Take 1 tablet (40 mg total) by mouth daily.   potassium chloride  SA (KLOR-CON  M) 20 MEQ tablet Take 2 tablets (40 mEq  total) by mouth daily. (Patient taking differently: Take 20 mEq by mouth 2 (two) times daily.)   sacubitril -valsartan  (ENTRESTO ) 24-26 MG Take 1 tablet by mouth 2 (two) times daily.   spironolactone  (ALDACTONE ) 25 MG tablet Take 1 tablet (25 mg total) by mouth daily.   No facility-administered encounter medications on file as of 01/25/2023.     Patient Active Problem List   Diagnosis Date Noted   ICD (implantable cardioverter-defibrillator) in place - MDT 12/27/2021   Obesity (BMI 30-39.9) 11/22/2021   History of CVA with residual deficit 02/22/2021   Heart failure with reduced ejection fraction (HCC)- s/p ICD and pacemaker  10/19/2017   Allergic rhinitis 11/09/2014   Cerebral infarction due to embolism of cerebral artery (HCC) 12/03/2013   GERD (gastroesophageal reflux disease)    Non-ischemic cardiomyopathy (HCC) 08/11/2013   Mitral regurgitation    Healthcare maintenance 04/09/2013     Health Maintenance Due  Topic Date Due   MAMMOGRAM  09/16/2022   COVID-19 Vaccine (7 - 2024-25 season) 09/17/2022     Review of Systems 12 point ros is negative - except for mild soreness at cardiac device pocket from recent insertion  Physical Exam   BP 101/71   Pulse 83   Temp 97.7 F (36.5 C) (Oral)   Ht 5' 5 (1.651 m)   Wt 174 lb (78.9 kg)   SpO2 98%   BMI 28.96 kg/m  Physical Exam  Constitutional:  oriented to person, place, and time. appears well-developed and well-nourished. No distress.  HENT: [place]/AT, PERRLA, no scleral icterus Mouth/Throat: Oropharynx is clear and moist. No oropharyngeal exudate.  Cardiovascular: Normal rate, regular rhythm and normal heart sounds. Exam reveals no gallop and no friction rub.  No murmur heard.  Chest wall = incision healing. No oozing. No erythema Pulmonary/Chest: Effort normal and breath sounds normal. No respiratory distress.  has no wheezes.  Neck = supple, no nuchal rigidity Abdominal: Soft. Bowel sounds are normal.  exhibits no  distension. There is no tenderness.  Lymphadenopathy: no cervical adenopathy. No axillary adenopathy Neurological: alert and oriented to person, place, and time.  Skin: Skin is warm and dry. No rash noted. No erythema.  Psychiatric: a normal mood and affect.  behavior is normal.    CBC Lab Results  Component Value Date   WBC 9.4 01/08/2023   RBC 4.51 01/08/2023   HGB 12.5 01/08/2023   HCT 39.1 01/08/2023   PLT 297 01/08/2023   MCV 87 01/08/2023   MCH 27.7 01/08/2023   MCHC 32.0 01/08/2023   RDW 14.1 01/08/2023   LYMPHSABS 2.7 02/22/2021   MONOABS 0.5 11/03/2013   EOSABS 0.4 02/22/2021    BMET Lab Results  Component Value Date   NA 141 01/08/2023   K 3.9 01/08/2023   CL 106 01/08/2023   CO2 17 (L) 01/08/2023   GLUCOSE 94 01/08/2023   BUN 18 01/08/2023   CREATININE 0.96 01/08/2023   CALCIUM  9.1 01/08/2023   GFRNONAA >60 11/27/2022   GFRAA >60 10/15/2019     Assessment and Plan  Possible cardiac pocket infection = will check labs to include inflammatory markers. Conservative approach is to treat and device extraction.  Discussed with dr fernande that patient's complaint of discomfort prior to device extraction is what prompted culture despite pocket looking clean/not inflamed but suspicion for infection is there.  Have arranged with his partners for upcoming device extraction and plan for treatment  Diagnosis: Cardiac device pocket infection  Culture Result: staph epi  Allergies  Allergen Reactions   Voltaren  [Diclofenac ] Rash    OPAT Orders Discharge antibiotics to be given via PICC line Discharge antibiotics: Per pharmacy protocol cefazolin  2gm iv Q8 hr  Duration: 4 weeks  End Date: Feb 13th  [facility] Care Per Protocol:  Home health RN for IV administration and teaching; PICC line care and labs.    Labs weekly while on IV antibiotics: _x_ CBC with differential _x_ BMP __ CMP   x__ Please pull PIC at completion of IV antibiotics  Fax weekly labs  to [phone]  Clinic Follow Up Appt: 4 wk  @ [facility]

## 2023-01-26 LAB — CBC WITH DIFFERENTIAL/PLATELET
Absolute Lymphocytes: 1544 {cells}/uL (ref 850–3900)
Absolute Monocytes: 972 {cells}/uL — ABNORMAL HIGH (ref 200–950)
Basophils Absolute: 65 {cells}/uL (ref 0–200)
Basophils Relative: 0.6 %
Eosinophils Absolute: 432 {cells}/uL (ref 15–500)
Eosinophils Relative: 4 %
HCT: 41.3 % (ref 35.0–45.0)
Hemoglobin: 13.1 g/dL (ref 11.7–15.5)
MCH: 27.8 pg (ref 27.0–33.0)
MCHC: 31.7 g/dL — ABNORMAL LOW (ref 32.0–36.0)
MCV: 87.7 fL (ref 80.0–100.0)
MPV: 10.6 fL (ref 7.5–12.5)
Monocytes Relative: 9 %
Neutro Abs: 7787 {cells}/uL (ref 1500–7800)
Neutrophils Relative %: 72.1 %
Platelets: 303 10*3/uL (ref 140–400)
RBC: 4.71 10*6/uL (ref 3.80–5.10)
RDW: 13.8 % (ref 11.0–15.0)
Total Lymphocyte: 14.3 %
WBC: 10.8 10*3/uL (ref 3.8–10.8)

## 2023-01-26 LAB — BASIC METABOLIC PANEL WITH GFR
BUN: 10 mg/dL (ref 7–25)
CO2: 22 mmol/L (ref 20–32)
Calcium: 9.7 mg/dL (ref 8.6–10.4)
Chloride: 101 mmol/L (ref 98–110)
Creat: 0.98 mg/dL (ref 0.60–1.00)
Glucose, Bld: 98 mg/dL (ref 65–99)
Potassium: 3.7 mmol/L (ref 3.5–5.3)
Sodium: 138 mmol/L (ref 135–146)

## 2023-01-26 LAB — C-REACTIVE PROTEIN: CRP: 53.8 mg/L — ABNORMAL HIGH (ref <8.0)

## 2023-01-26 NOTE — Telephone Encounter (Signed)
 Thank you :)

## 2023-01-29 ENCOUNTER — Other Ambulatory Visit (HOSPITAL_COMMUNITY): Payer: Self-pay | Admitting: Cardiology

## 2023-01-29 ENCOUNTER — Ambulatory Visit: Payer: 59 | Attending: Internal Medicine

## 2023-01-29 DIAGNOSIS — I5022 Chronic systolic (congestive) heart failure: Secondary | ICD-10-CM

## 2023-01-29 DIAGNOSIS — Z95810 Presence of automatic (implantable) cardiac defibrillator: Secondary | ICD-10-CM | POA: Diagnosis not present

## 2023-01-31 ENCOUNTER — Ambulatory Visit: Payer: 59

## 2023-01-31 ENCOUNTER — Ambulatory Visit (HOSPITAL_COMMUNITY): Payer: 59

## 2023-01-31 ENCOUNTER — Telehealth: Payer: Self-pay

## 2023-01-31 NOTE — Progress Notes (Signed)
Electrophysiology Office Note:    Date:  02/01/2023   ID:  AFI CHESTANG, DOB [birth year], MRN [id number]  [facility] Cardiologist:  None  [facility] Electrophysiologist:  Sherryl Manges, MD   Referring MD: Storm Frisk, MD   Chief Complaint: 6 device associated infection  History of Present Illness:   [name] is a 73 year old woman who I am seeing today for a CIED associated infection at the request of Dr. Graciela Husbands.  The patient has a history of nonischemic cardiomyopathy, intermittent left bundle branch block and heart failure.  The patient has a Medtronic CRT-D implanted in 2015 for heart failure and a left bundle branch block.  She underwent generator replacement January 18, 2023.  The patient reported pocket pain at the time of generator replacement.  For this reason, cultures were taken of the pocket.  These cultures returned positive for staph capitis.  The patient was referred to infectious disease and saw Dr. Marcha Dutton in January 25, 2023.  Dr. Graciela Husbands discussed her case with Dr. Drue Second.  After thoroughly going through the case, Dr. Drue Second has recommended device removal/extraction.  The patient is doing well today.  She does not have a PICC line in place.  She was due to start IV antibiotics later this week.  She still has some pain in the left shoulder.  She has done some research on lead extraction prior to today's appointment.     Their past medical, social and family history was reviewed.   ROS:   Please see the history of present illness.    All other systems reviewed and are negative.  EKGs/Labs/Other Studies Reviewed:    The following studies were reviewed today:  February 01, 2023 in clinic device interrogation personally reviewed Battery longevity 11.5 years Presenting rhythm a sensed, V paced Underlying rhythm a sensed, V sense Lead parameter stable All 3 leads were implanted November 05, 2013 Today I reprogrammed the device to VVI 40.  With this  programming she did not pace  April 11, 2022 echo EF 35-40 RV function mildly reduced Mild MR  March 14th 2024 2 view chest x-ray CRT-D in situ  EKG Interpretation Date/Time:  Thursday February 01 2023 11:17:58 EST Ventricular Rate:  86 PR Interval:  110 QRS Duration:  124 QT Interval:  390 QTC Calculation: 466 R Axis:   104  Text Interpretation: Atrial-sensed ventricular-paced rhythm Confirmed by Steffanie Dunn [phone]) on 02/01/2023 11:50:46 AM    Physical Exam:    VS:  BP 110/68   Pulse 86   Ht 5' 4.5" (1.638 m)   Wt 174 lb (78.9 kg)   BMI 29.41 kg/m     Wt Readings from Last 3 Encounters:  02/01/23 174 lb (78.9 kg)  01/25/23 174 lb (78.9 kg)  01/18/23 174 lb (78.9 kg)     GEN: no distress.  Poor dentition CARD: RRR, No MRG.  ICD pocket healing well RESP: No IWOB. CTAB.        ASSESSMENT AND PLAN:    1. Heart failure with reduced ejection fraction [facility])- s/p ICD and pacemaker    2. Non-ischemic cardiomyopathy (HCC)   3. Presence of cardiac resynchronization therapy defibrillator (CRT-D)   4. Infection involving implantable cardioverter-defibrillator (ICD), initial encounter (HCC)      #CRT-D in situ #CIED associated infection [name] is a 73 year old woman who I am seeing for evaluation of CIED associated infection.  This was discovered due to chronic pocket pain and cultures positive from the time of  generator replacement for Staph capitis.  ID has been consulted and has recommended device extraction for definitive management of her infection.  I had a long discussion with the patient today about CRT-D extraction, IV antibiotics, device reimplantation.  We discussed the procedural details, need for cardiothoracic surgical backup, risks and recovery.  She would like to proceed with scheduling.  Thankfully she is not dependent on her devices.  I have reprogrammed her VVI 40 to confirm this.  I do not anticipate reimplanting or implanting a temp-permanent  pacemaker.  This will allow her to have a device free interval.  She should not proceed with PICC line implant at this time.  I would wait until after the device has been extracted before considering a PICC line.  I will discuss with Dr. Drue Second from ID today.  Risks, benefits, alternatives to ICD extraction were discussed in detail with the patient today. The patient understands that the risks include but are not limited to bleeding, infection, pneumothorax, perforation, tamponade, vascular damage, renal failure, MI, stroke, death, inappropriate shocks, severe bleeding requiring emergent open heart surgery, and lead dislodgement and wishes to proceed.  We will therefore schedule device implantation at the next available time.  She will need a CT scan lead extraction protocol prior to the procedure.  She will hold her Plavix for 5 days prior to the procedure.    Signed, Rossie Muskrat. Lalla Brothers, MD, [facility], [facility] 02/01/2023 11:51 AM    Electrophysiology [facility]

## 2023-01-31 NOTE — H&P (View-Only) (Signed)
Electrophysiology Office Note:    Date:  02/01/2023   ID:  [name], DOB 1950/06/08, MRN [id number]  [facility] Cardiologist:  None  [facility] Electrophysiologist:  Sherryl Manges, MD   Referring MD: Storm Frisk, MD   Chief Complaint: 6 device associated infection  History of Present Illness:   [name] is a 73 year old woman who I am seeing today for a CIED associated infection at the request of Dr. Graciela Husbands.  The patient has a history of nonischemic cardiomyopathy, intermittent left bundle branch block and heart failure.  The patient has a Medtronic CRT-D implanted in 2015 for heart failure and a left bundle branch block.  She underwent generator replacement January 18, 2023.  The patient reported pocket pain at the time of generator replacement.  For this reason, cultures were taken of the pocket.  These cultures returned positive for staph capitis.  The patient was referred to infectious disease and saw Dr. Marcha Dutton in January 25, 2023.  Dr. Graciela Husbands discussed her case with Dr. Drue Second.  After thoroughly going through the case, Dr. Drue Second has recommended device removal/extraction.  The patient is doing well today.  She does not have a PICC line in place.  She was due to start IV antibiotics later this week.  She still has some pain in the left shoulder.  She has done some research on lead extraction prior to today's appointment.     Their past medical, social and family history was reviewed.   ROS:   Please see the history of present illness.    All other systems reviewed and are negative.  EKGs/Labs/Other Studies Reviewed:    The following studies were reviewed today:  February 01, 2023 in clinic device interrogation personally reviewed Battery longevity 11.5 years Presenting rhythm a sensed, V paced Underlying rhythm a sensed, V sense Lead parameter stable All 3 leads were implanted November 05, 2013 Today I reprogrammed the device to VVI 40.  With this  programming she did not pace  April 11, 2022 echo EF 35-40 RV function mildly reduced Mild MR  March 14th 2024 2 view chest x-ray CRT-D in situ  EKG Interpretation Date/Time:  Thursday February 01 2023 11:17:58 EST Ventricular Rate:  86 PR Interval:  110 QRS Duration:  124 QT Interval:  390 QTC Calculation: 466 R Axis:   104  Text Interpretation: Atrial-sensed ventricular-paced rhythm Confirmed by Steffanie Dunn [phone]) on 02/01/2023 11:50:46 AM    Physical Exam:    VS:  BP 110/68   Pulse 86   Ht 5' 4.5" (1.638 m)   Wt 174 lb (78.9 kg)   BMI 29.41 kg/m     Wt Readings from Last 3 Encounters:  02/01/23 174 lb (78.9 kg)  01/25/23 174 lb (78.9 kg)  01/18/23 174 lb (78.9 kg)     GEN: no distress.  Poor dentition CARD: RRR, No MRG.  ICD pocket healing well RESP: No IWOB. CTAB.        ASSESSMENT AND PLAN:    1. Heart failure with reduced ejection fraction [facility])- s/p ICD and pacemaker    2. Non-ischemic cardiomyopathy (HCC)   3. Presence of cardiac resynchronization therapy defibrillator (CRT-D)   4. Infection involving implantable cardioverter-defibrillator (ICD), initial encounter (HCC)      #CRT-D in situ #CIED associated infection [name] is a 72 year old woman who I am seeing for evaluation of CIED associated infection.  This was discovered due to chronic pocket pain and cultures positive from the time of  generator replacement for Staph capitis.  ID has been consulted and has recommended device extraction for definitive management of her infection.  I had a long discussion with the patient today about CRT-D extraction, IV antibiotics, device reimplantation.  We discussed the procedural details, need for cardiothoracic surgical backup, risks and recovery.  She would like to proceed with scheduling.  Thankfully she is not dependent on her devices.  I have reprogrammed her VVI 40 to confirm this.  I do not anticipate reimplanting or implanting a temp-permanent  pacemaker.  This will allow her to have a device free interval.  She should not proceed with PICC line implant at this time.  I would wait until after the device has been extracted before considering a PICC line.  I will discuss with Dr. Drue Second from ID today.  Risks, benefits, alternatives to ICD extraction were discussed in detail with the patient today. The patient understands that the risks include but are not limited to bleeding, infection, pneumothorax, perforation, tamponade, vascular damage, renal failure, MI, stroke, death, inappropriate shocks, severe bleeding requiring emergent open heart surgery, and lead dislodgement and wishes to proceed.  We will therefore schedule device implantation at the next available time.  She will need a CT scan lead extraction protocol prior to the procedure.  She will hold her Plavix for 5 days prior to the procedure.    Signed, Rossie Muskrat. Lalla Brothers, MD, [facility], [facility] 02/01/2023 11:51 AM    Electrophysiology [facility]

## 2023-01-31 NOTE — Telephone Encounter (Signed)
 Attempted to call patient to schedule an appointment with Dr. Marven Slimmer to discuss extraction of her device. Unable to leave voicemail.

## 2023-02-01 ENCOUNTER — Telehealth (HOSPITAL_BASED_OUTPATIENT_CLINIC_OR_DEPARTMENT_OTHER): Payer: Self-pay | Admitting: Licensed Clinical Social Worker

## 2023-02-01 ENCOUNTER — Ambulatory Visit: Payer: 59

## 2023-02-01 ENCOUNTER — Telehealth (HOSPITAL_COMMUNITY): Payer: Self-pay | Admitting: *Deleted

## 2023-02-01 ENCOUNTER — Ambulatory Visit: Payer: 59 | Attending: Cardiology | Admitting: Cardiology

## 2023-02-01 ENCOUNTER — Other Ambulatory Visit: Payer: Self-pay

## 2023-02-01 VITALS — BP 110/68 | HR 86 | Ht 64.5 in | Wt 174.0 lb

## 2023-02-01 DIAGNOSIS — T827XXA Infection and inflammatory reaction due to other cardiac and vascular devices, implants and grafts, initial encounter: Secondary | ICD-10-CM | POA: Diagnosis not present

## 2023-02-01 DIAGNOSIS — I428 Other cardiomyopathies: Secondary | ICD-10-CM

## 2023-02-01 DIAGNOSIS — I5020 Unspecified systolic (congestive) heart failure: Secondary | ICD-10-CM

## 2023-02-01 DIAGNOSIS — Z95810 Presence of automatic (implantable) cardiac defibrillator: Secondary | ICD-10-CM

## 2023-02-01 LAB — CUP PACEART INCLINIC DEVICE CHECK
Date Time Interrogation Session: 20250116123540
Implantable Lead Connection Status: 753985
Implantable Lead Connection Status: 753985
Implantable Lead Connection Status: 753985
Implantable Lead Implant Date: 20151021
Implantable Lead Implant Date: 20151021
Implantable Lead Implant Date: 20151021
Implantable Lead Location: 753858
Implantable Lead Location: 753859
Implantable Lead Location: 753860
Implantable Lead Model: 4298
Implantable Lead Model: 5076
Implantable Pulse Generator Implant Date: 20250102

## 2023-02-01 NOTE — Patient Instructions (Addendum)
Medication Instructions:  Your physician recommends that you continue on your current medications as directed. Please refer to the Current Medication list given to you today.  *If you need a refill on your cardiac medications before your next appointment, please call your pharmacy*   Lab Work: None ordered.   If you have labs (blood work) drawn today and your tests are completely normal, you will receive your results only by: MyChart Message (if you have MyChart) OR A paper copy in the mail If you have any lab test that is abnormal or we need to change your treatment, we will call you to review the results.   Testing/Procedures: Dr Lalla Brothers plans to remove your Bi-Ventricular ICD system - you will be contacted with details and instructions.   Follow-Up: At [facility], you and your health needs are our priority.  As part of our continuing mission to provide you with exceptional heart care, we have created designated Provider Care Teams.  These Care Teams include your primary Cardiologist (physician) and Advanced Practice Providers (APPs -  Physician Assistants and Nurse Practitioners) who all work together to provide you with the care you need, when you need it.  We recommend signing up for the patient portal called "MyChart".  Sign up information is provided on this After Visit Summary.  MyChart is used to connect with patients for Virtual Visits (Telemedicine).  Patients are able to view lab/test results, encounter notes, upcoming appointments, etc.  Non-urgent messages can be sent to your provider as well.   To learn more about what you can do with MyChart, go to [email or web address].    Your next appointment:   To be scheduled

## 2023-02-01 NOTE — Telephone Encounter (Signed)
Attempted to call patient regarding upcoming cardiac CT appointment. Unable to leave VM (VM not set up). Danielle Frame RN Navigator Cardiac Imaging [facility] [phone] Office

## 2023-02-01 NOTE — Telephone Encounter (Addendum)
Per Dr. Drue Second lets reschedule her picc line and abtx until 1/25. Dr Lalla Brothers mentioned she is scheduled for extraction on 1/24. Will have picc line placed while admitted.  Will update [facility] team.  Not able to reach pt for update. Juanita Laster, RMA.

## 2023-02-01 NOTE — Progress Notes (Signed)
Heart and Vascular Care Navigation  02/01/2023  [name] 1950/05/28 [id number]  Reason for Referral: transportation issues for CT in < 24 hrs Patient is participating in a Managed Medicaid Plan: No, [employer]   Engaged with patient by telephone for initial visit for Heart and Vascular Care Coordination.                                                                                                   Assessment:                                     LCSW received request for transportation assistance for pt. Stat CT scheduled for tomorrow so she can be considered for device extraction. Was unable to reach pt initially at [phone], called twice and vociemail not set up. Pt spouse number listed is also out of service. April, CMA, was able to reach her and coordinate her calling me.   Spoke with pt- introduced self, role, reason for call. Pt confirmed address on [name], resides with her husband and has a son. She shares they use the same number now since her husband's phone doesn't work. She and husband use cabs through [employer] to get to appts but financially they have so many upcoming visits/needs that they dont have the money to add another ride. Received permission to arrange a ride through [employer] taxi, contracted with [facility], tomorrow at 8am. Pt will be ready and took down [employer] number. Vouchers sent in, pt will need to sign waiver tomorrow.   Otherwise pt denies any issues with obtaining or affording food, medications, or housing at this time. They do their best with utilities and other costs. We discussed sending additional resources for transportation, food and utilities to use as needed. Pt agreeable to those being mailed at this time- no additional questions.   HRT/VAS Care Coordination     Patients Home Cardiology Office [facility]   Outpatient Care Team Social Worker   Social Worker Name: Octavio Graves, [place], [phone]    Living arrangements for the past 2 months Apartment   Lives with: Spouse   Patient Current Insurance Coverage Managed Medicare   Patient Has Concern With Paying Medical Bills No   Does Patient Have Prescription Coverage? Yes   Home Assistive Devices/Equipment Cane (specify quad or straight)       Social History:                                                                             SDOH Screenings   Food Insecurity: No Food Insecurity (02/01/2023)  Housing: Low Risk  (02/01/2023)  Transportation Needs: Unmet Transportation Needs (02/01/2023)  Utilities: Not At Risk (02/01/2023)  Alcohol Screen: Low Risk  (07/26/2022)  Depression (PHQ2-9): Low Risk  (09/21/2022)  Financial Resource Strain: Low Risk  (02/01/2023)  Physical Activity: Insufficiently Active (07/26/2022)  Social Connections: Moderately Integrated (07/26/2022)  Stress: No Stress Concern Present (07/26/2022)  Tobacco Use: Low Risk  (01/25/2023)  Health Literacy: Adequate Health Literacy (02/01/2023)    SDOH Interventions: Financial Resources:  Financial Strain Interventions: Intervention Not Indicated DSS for financial assistance  Food Insecurity:  Food Insecurity Interventions: Other (Comment), Intervention Not Indicated (will mail Food and Nutrition Services card from [employer] if interested)  Housing Insecurity:  Housing Interventions: Intervention Not Indicated  Transportation:   Transportation Interventions: Taxi Voucher Given, Government social research officer     Other Care Navigation Interventions:     Provided Pharmacy assistance resources  Pt denies any issues in this domain- is getting free home delivery from [employer]   Follow-up plan:   Ride scheduled for tomorrow at [employer] with [employer] Information added to appt notes. Will mail food, transportation and utility assistance to pt home address along with my card.

## 2023-02-02 ENCOUNTER — Ambulatory Visit (HOSPITAL_COMMUNITY): Payer: 59

## 2023-02-02 ENCOUNTER — Telehealth: Payer: Self-pay | Admitting: Licensed Clinical Social Worker

## 2023-02-02 ENCOUNTER — Ambulatory Visit (HOSPITAL_COMMUNITY)
Admission: RE | Admit: 2023-02-02 | Discharge: 2023-02-02 | Disposition: A | Discharge status: Home / Self Care | Payer: 59 | Source: Ambulatory Visit | Attending: Cardiology | Admitting: Cardiology

## 2023-02-02 DIAGNOSIS — T827XXA Infection and inflammatory reaction due to other cardiac and vascular devices, implants and grafts, initial encounter: Secondary | ICD-10-CM | POA: Diagnosis present

## 2023-02-02 DIAGNOSIS — Y718 Miscellaneous cardiovascular devices associated with adverse incidents, not elsewhere classified: Secondary | ICD-10-CM | POA: Insufficient documentation

## 2023-02-02 DIAGNOSIS — I5020 Unspecified systolic (congestive) heart failure: Secondary | ICD-10-CM | POA: Diagnosis not present

## 2023-02-02 DIAGNOSIS — Y848 Other medical procedures as the cause of abnormal reaction of the patient, or of later complication, without mention of misadventure at the time of the procedure: Secondary | ICD-10-CM | POA: Diagnosis not present

## 2023-02-02 DIAGNOSIS — R918 Other nonspecific abnormal finding of lung field: Secondary | ICD-10-CM | POA: Diagnosis not present

## 2023-02-02 DIAGNOSIS — I700 Atherosclerosis of aorta: Secondary | ICD-10-CM | POA: Insufficient documentation

## 2023-02-02 DIAGNOSIS — E041 Nontoxic single thyroid nodule: Secondary | ICD-10-CM | POA: Insufficient documentation

## 2023-02-02 DIAGNOSIS — K449 Diaphragmatic hernia without obstruction or gangrene: Secondary | ICD-10-CM | POA: Insufficient documentation

## 2023-02-02 MED ORDER — IOHEXOL 350 MG/ML SOLN
95.0000 mL | Freq: Once | INTRAVENOUS | Status: AC | PRN
  Administered 2023-02-02: 95 mL via INTRAVENOUS

## 2023-02-02 NOTE — H&P (View-Only) (Signed)
EPIC Encounter for ICM Monitoring  Patient Name: RANDAL YEPIZ is a 73 y.o. female Date: 02/02/2023 Primary Care Physican: Storm Frisk, MD Primary Cardiologist: Shirlee Latch Electrophysiologist: Joycelyn Schmid Pacing: 96.8%         08/30/2022 Office Weight: 178 lbs                                                          Transmission reviewed.   Pt scheduled for device extraction 02/09/23 due to infection.  Per Dr Lovena Neighbours note, he does not anticipate a reimplantation or or implanting a temp-permanent pacemaker. This will allow her to have a device free interval.    Optivol thoracic impedance suggesting normal fluid levels since battery replacement.     Prescribed:  Furosemide 40 mg take 2 tablet(s) (80 mg total) by mouth daily.   Potassium 20 mEq take 2 tablets(s) (40 mEq total) by mouth daily  Spironolactone 25 mg take 1 tablet    Labs: 11/27/2022 Creatinine 0.96, BUN 12, Potassium 3.9, Sodium 137, GFR >60  11/07/2022 Creatinine 1.04, BUN 12, Potassium 3.6, Sodium 138, GFR 57  09/06/2022 Creatinine 0.96, BUN 8,   Potassium 3.9, Sodium 136, GFR >60 08/30/2022 Creatinine 1.64, BUN 43, Potassium 4.4, Sodium 134, GFR 33 03/30/2022 Creatinine 0.95, BUN 10, Potassium 3.7, Sodium 140, GFR >60 03/22/2022 Creatinine 0.97, BUN 11, Potassium 2.3, Sodium 140, GFR >60 (low potassium has been addressed by Dr Shirlee Latch on 3/6) 01/30/2022 Creatinine 0.99, BUN 12, Potassium 3.7, Sodium 140  01/25/2022 Creatinine 0.99, BUN 11, Potassium 2.7, Sodium 139  A complete set of results can be found in Results Review.   Recommendations:  No changes.   Follow-up plan: No further ICM clinic phone appointment due to device extraction.      EP/Cardiology Office Visits: Recall 05/06/2023 with Dr Shirlee Latch.       Copy of ICM check sent to Dr. Graciela Husbands.    3 month ICM trend: 01/28/2023.    12-14 Month ICM trend:     Karie Soda, RN 02/02/2023 1:43 PM

## 2023-02-02 NOTE — Telephone Encounter (Signed)
H&V Care Navigation CSW Progress Note  Clinical Social Worker  received waiver  to submit to [facility] transportation. Pt was able to get to CT with assistance of [employer] taxi, ride home arranged as well. She and her husband have already scheduled a ride to her appt on Friday for extraction. Remain available as needed.  Patient is participating in a Managed Medicaid Plan:  No, [employer]  SDOH Screenings   Food Insecurity: No Food Insecurity (02/01/2023)  Housing: Low Risk  (02/01/2023)  Transportation Needs: Unmet Transportation Needs (02/01/2023)  Utilities: Not At Risk (02/01/2023)  Alcohol Screen: Low Risk  (07/26/2022)  Depression (PHQ2-9): Low Risk  (09/21/2022)  Financial Resource Strain: Low Risk  (02/01/2023)  Physical Activity: Insufficiently Active (07/26/2022)  Social Connections: Moderately Integrated (07/26/2022)  Stress: No Stress Concern Present (07/26/2022)  Tobacco Use: Low Risk  (01/25/2023)  Health Literacy: Adequate Health Literacy (02/01/2023)     Octavio Graves, MSW, LCSW Clinical Social Worker II [facility] Heart/Vascular Care Navigation  [phone]- work cell phone (preferred) [phone]- desk phone

## 2023-02-02 NOTE — Progress Notes (Signed)
EPIC Encounter for ICM Monitoring  Patient Name: [name] is a 73 y.o. female Date: 02/02/2023 Primary Care Physican: Storm Frisk, MD Primary Cardiologist: Shirlee Latch Electrophysiologist: Joycelyn Schmid Pacing: 96.8%         08/30/2022 Office Weight: 178 lbs                                                          Transmission reviewed.   Pt scheduled for device extraction 02/09/23 due to infection.  Per Dr Lovena Neighbours note, he does not anticipate a reimplantation or or implanting a temp-permanent pacemaker. This will allow her to have a device free interval.    Optivol thoracic impedance suggesting normal fluid levels since battery replacement.     Prescribed:  Furosemide 40 mg take 2 tablet(s) (80 mg total) by mouth daily.   Potassium 20 mEq take 2 tablets(s) (40 mEq total) by mouth daily  Spironolactone 25 mg take 1 tablet    Labs: 11/27/2022 Creatinine 0.96, BUN 12, Potassium 3.9, Sodium 137, GFR >60  11/07/2022 Creatinine 1.04, BUN 12, Potassium 3.6, Sodium 138, GFR 57  09/06/2022 Creatinine 0.96, BUN 8,   Potassium 3.9, Sodium 136, GFR >60 08/30/2022 Creatinine 1.64, BUN 43, Potassium 4.4, Sodium 134, GFR 33 03/30/2022 Creatinine 0.95, BUN 10, Potassium 3.7, Sodium 140, GFR >60 03/22/2022 Creatinine 0.97, BUN 11, Potassium 2.3, Sodium 140, GFR >60 (low potassium has been addressed by Dr Shirlee Latch on 3/6) 01/30/2022 Creatinine 0.99, BUN 12, Potassium 3.7, Sodium 140  01/25/2022 Creatinine 0.99, BUN 11, Potassium 2.7, Sodium 139  A complete set of results can be found in Results Review.   Recommendations:  No changes.   Follow-up plan: No further ICM clinic phone appointment due to device extraction.      EP/Cardiology Office Visits: Recall 05/06/2023 with Dr Shirlee Latch.       Copy of ICM check sent to Dr. Graciela Husbands.    3 month ICM trend: 01/28/2023.    12-14 Month ICM trend:     Karie Soda, RN 02/02/2023 1:43 PM

## 2023-02-07 ENCOUNTER — Other Ambulatory Visit: Payer: Self-pay

## 2023-02-07 ENCOUNTER — Encounter (HOSPITAL_COMMUNITY): Payer: Self-pay | Admitting: Cardiology

## 2023-02-07 ENCOUNTER — Encounter: Payer: Self-pay | Admitting: General Practice

## 2023-02-07 NOTE — Progress Notes (Signed)
PCP -   PULMONARY Storm Frisk, MD  Cardiologist - [name], MD Electrophysiologist Sherryl Manges, MD  Infectious Disease Judyann Munson, MD    PPM/ICD - Medtronic being removed due to infection Device Orders -  Rep Notified -   Chest x-ray - 04-01-22 EKG - 02-01-23 Stress Test -  ECHO - 3-36-24 Cardiac Cath - 06-05-19  CPAP - denies  DM denies Blood Thinner Instructions: denies Aspirin Instructions: n/a  ERAS Protcol - NPO  COVID TEST-   Anesthesia review: Yes, Hx of MI CHF, ICD infection  Patient verbally denies any shortness of breath, fever, cough and chest pain during phone call   -------------  SDW INSTRUCTIONS given:  Your procedure is scheduled on February 09, 2023.  Report to [facility] Main Entrance "A" at 1430 A.M., and check in at the Admitting office.  Call this number if you have problems the morning of surgery:  [phone]   Remember:  Do not eat or drink after midnight the night before your surgery     Take these medicines the morning of surgery with A SIP OF WATER NO  MEDS PER DR. Lalla Brothers INSTRUCTIONS clopidogrel (PLAVIX)  LAST DOSE 02-03-23 FARXIGA LAST DOSE 02-05-23  As of today, STOP taking any Aspirin (unless otherwise instructed by your surgeon) Aleve, Naproxen, Ibuprofen, Motrin, Advil, Goody's, BC's, all herbal medications, fish oil, and all vitamins.                      Do not wear jewelry, make up, or nail polish            Do not wear lotions, powders, perfumes/colognes, or deodorant.            Do not shave 48 hours prior to surgery.  Men may shave face and neck.            Do not bring valuables to the hospital.            [facility] is not responsible for any belongings or valuables.  Do NOT Smoke (Tobacco/Vaping) 24 hours prior to your procedure If you use a CPAP at night, you may bring all equipment for your overnight stay.   Contacts, glasses, dentures or bridgework may not be worn into surgery.      For patients  admitted to the hospital, discharge time will be determined by your treatment team.   Patients discharged the day of surgery will not be allowed to drive home, and someone needs to stay with them for 24 hours.    Special instructions:   [place]- Preparing For Surgery  Before surgery, you can play an important role. Because skin is not sterile, your skin needs to be as free of germs as possible. You can reduce the number of germs on your skin by washing with CHG (chlorahexidine gluconate) Soap before surgery.  CHG is an antiseptic cleaner which kills germs and bonds with the skin to continue killing germs even after washing.    Oral Hygiene is also important to reduce your risk of infection.  Remember - BRUSH YOUR TEETH THE MORNING OF SURGERY WITH YOUR REGULAR TOOTHPASTE  Please do not use if you have an allergy to CHG or antibacterial soaps. If your skin becomes reddened/irritated stop using the CHG.  Do not shave (including legs and underarms) for at least 48 hours prior to first CHG shower. It is OK to shave your face.  Please follow these instructions carefully.  Shower the NIGHT BEFORE SURGERY and the MORNING OF SURGERY with DIAL Soap.   Pat yourself dry with a CLEAN TOWEL.  Wear CLEAN PAJAMAS to bed the night before surgery  Place CLEAN SHEETS on your bed the night of your first shower and DO NOT SLEEP WITH PETS.   Day of Surgery: Please shower morning of surgery  Wear Clean/Comfortable clothing the morning of surgery Do not apply any deodorants/lotions.   Remember to brush your teeth WITH YOUR REGULAR TOOTHPASTE.   Questions were answered. Patient verbalized understanding of instructions.

## 2023-02-08 ENCOUNTER — Telehealth: Payer: Self-pay | Admitting: Cardiology

## 2023-02-08 NOTE — Progress Notes (Signed)
Pt called pre-op front desk to confirm arrival time for surgery, tomorrow, 02/09/23. I verified that her surgery time was 1700 and that pt needed to arrive to [facility] Main Entrance "A" by 1430. This was also noted by pre-admission nurse, Victorino Dike, RN when pt received a pre-op call yesterday, 02/07/23. Pt became belligerent and screamed "I've been told multiple times and y'all need to get your stuff together." I reiterated that she was told the correct arrival time and it had not changed. Pt disconnected the call before conversation was completed.   Michael Boston, RN

## 2023-02-08 NOTE — Telephone Encounter (Signed)
Spoke with Victorino Dike and confirmed the time of the patient's lead extraction tomorrow. Also confirmed that patient will need to stay overnight.

## 2023-02-08 NOTE — Progress Notes (Signed)
Spoke with patient confirming time of arrival at 1200 (noon) for 3:00 PM start on 02-09-23 patient voiced understanding. Patient also aware of staying overnight. Dr. Lalla Brothers aware of transportation issues and patient would be taking a uber or taxi home on discharge day

## 2023-02-08 NOTE — Telephone Encounter (Signed)
Victorino Dike from [facility] pre admin testing requesting cb to discuss pt's upcoming procedure.

## 2023-02-09 ENCOUNTER — Ambulatory Visit (HOSPITAL_COMMUNITY): Payer: 59

## 2023-02-09 ENCOUNTER — Ambulatory Visit (HOSPITAL_COMMUNITY)
Admission: RE | Disposition: A | Discharge status: Home / Self Care | Payer: 59 | Source: Home / Self Care | Attending: Cardiology

## 2023-02-09 ENCOUNTER — Other Ambulatory Visit: Payer: Self-pay

## 2023-02-09 ENCOUNTER — Ambulatory Visit: Payer: 59 | Admitting: Family Medicine

## 2023-02-09 ENCOUNTER — Ambulatory Visit (HOSPITAL_BASED_OUTPATIENT_CLINIC_OR_DEPARTMENT_OTHER): Payer: 59 | Admitting: Physician Assistant

## 2023-02-09 ENCOUNTER — Ambulatory Visit (HOSPITAL_COMMUNITY): Payer: 59 | Admitting: Physician Assistant

## 2023-02-09 ENCOUNTER — Encounter (HOSPITAL_COMMUNITY): Payer: Self-pay | Admitting: Cardiology

## 2023-02-09 ENCOUNTER — Ambulatory Visit (HOSPITAL_COMMUNITY)
Admission: RE | Admit: 2023-02-09 | Discharge: 2023-02-10 | Disposition: A | Discharge status: Home / Self Care | Payer: 59 | Attending: Cardiology | Admitting: Cardiology

## 2023-02-09 DIAGNOSIS — I340 Nonrheumatic mitral (valve) insufficiency: Secondary | ICD-10-CM

## 2023-02-09 DIAGNOSIS — I130 Hypertensive heart and chronic kidney disease with heart failure and stage 1 through stage 4 chronic kidney disease, or unspecified chronic kidney disease: Secondary | ICD-10-CM | POA: Insufficient documentation

## 2023-02-09 DIAGNOSIS — Y831 Surgical operation with implant of artificial internal device as the cause of abnormal reaction of the patient, or of later complication, without mention of misadventure at the time of the procedure: Secondary | ICD-10-CM | POA: Diagnosis not present

## 2023-02-09 DIAGNOSIS — I5022 Chronic systolic (congestive) heart failure: Secondary | ICD-10-CM | POA: Insufficient documentation

## 2023-02-09 DIAGNOSIS — I447 Left bundle-branch block, unspecified: Secondary | ICD-10-CM | POA: Insufficient documentation

## 2023-02-09 DIAGNOSIS — N189 Chronic kidney disease, unspecified: Secondary | ICD-10-CM | POA: Diagnosis not present

## 2023-02-09 DIAGNOSIS — T827XXD Infection and inflammatory reaction due to other cardiac and vascular devices, implants and grafts, subsequent encounter: Secondary | ICD-10-CM

## 2023-02-09 DIAGNOSIS — Z8673 Personal history of transient ischemic attack (TIA), and cerebral infarction without residual deficits: Secondary | ICD-10-CM | POA: Insufficient documentation

## 2023-02-09 DIAGNOSIS — I428 Other cardiomyopathies: Secondary | ICD-10-CM | POA: Insufficient documentation

## 2023-02-09 DIAGNOSIS — I10 Essential (primary) hypertension: Secondary | ICD-10-CM | POA: Insufficient documentation

## 2023-02-09 DIAGNOSIS — Z95810 Presence of automatic (implantable) cardiac defibrillator: Secondary | ICD-10-CM

## 2023-02-09 DIAGNOSIS — E785 Hyperlipidemia, unspecified: Secondary | ICD-10-CM | POA: Diagnosis not present

## 2023-02-09 DIAGNOSIS — B958 Unspecified staphylococcus as the cause of diseases classified elsewhere: Secondary | ICD-10-CM | POA: Diagnosis not present

## 2023-02-09 DIAGNOSIS — T827XXA Infection and inflammatory reaction due to other cardiac and vascular devices, implants and grafts, initial encounter: Secondary | ICD-10-CM | POA: Diagnosis present

## 2023-02-09 DIAGNOSIS — T82118A Breakdown (mechanical) of other cardiac electronic device, initial encounter: Secondary | ICD-10-CM

## 2023-02-09 DIAGNOSIS — I5020 Unspecified systolic (congestive) heart failure: Secondary | ICD-10-CM | POA: Diagnosis present

## 2023-02-09 HISTORY — PX: LEAD EXTRACTION: EP1211

## 2023-02-09 HISTORY — DX: Personal history of other diseases of the digestive system: Z87.19

## 2023-02-09 HISTORY — PX: TRANSESOPHAGEAL ECHOCARDIOGRAM (CATH LAB): EP1270

## 2023-02-09 LAB — ABO/RH: ABO/RH(D): B NEG

## 2023-02-09 LAB — PREPARE RBC (CROSSMATCH)

## 2023-02-09 SURGERY — LEAD EXTRACTION
Anesthesia: General

## 2023-02-09 MED ORDER — ONDANSETRON HCL 4 MG/2ML IJ SOLN
INTRAMUSCULAR | Status: AC
  Filled 2023-02-09: qty 2

## 2023-02-09 MED ORDER — SODIUM CHLORIDE 0.9% IV SOLUTION: Freq: Once | INTRAVENOUS | Status: DC

## 2023-02-09 MED ORDER — FUROSEMIDE 40 MG PO TABS
40.0000 mg | ORAL_TABLET | Freq: Two times a day (BID) | ORAL | Status: DC
Start: 2023-02-10 — End: 2023-02-10
  Administered 2023-02-10: 40 mg via ORAL
  Filled 2023-02-09: qty 1

## 2023-02-09 MED ORDER — OXYCODONE HCL 5 MG PO TABS: 5.0000 mg | ORAL_TABLET | Freq: Once | ORAL | Status: DC | PRN

## 2023-02-09 MED ORDER — SUGAMMADEX SODIUM 200 MG/2ML IV SOLN
INTRAVENOUS | Status: DC | PRN
  Administered 2023-02-09: 150 mg via INTRAVENOUS
  Administered 2023-02-09: 50 mg via INTRAVENOUS

## 2023-02-09 MED ORDER — SODIUM CHLORIDE 0.9% FLUSH: 3.0000 mL | Freq: Two times a day (BID) | INTRAVENOUS | Status: DC

## 2023-02-09 MED ORDER — PHENYLEPHRINE 80 MCG/ML (10ML) SYRINGE FOR IV PUSH (FOR BLOOD PRESSURE SUPPORT)
PREFILLED_SYRINGE | INTRAVENOUS | Status: DC | PRN
  Administered 2023-02-09: 80 ug via INTRAVENOUS

## 2023-02-09 MED ORDER — OXYCODONE HCL 5 MG/5ML PO SOLN: 5.0000 mg | Freq: Once | ORAL | Status: DC | PRN

## 2023-02-09 MED ORDER — SPIRONOLACTONE 25 MG PO TABS
25.0000 mg | ORAL_TABLET | Freq: Every day | ORAL | Status: DC
  Administered 2023-02-10: 25 mg via ORAL
  Filled 2023-02-09: qty 1

## 2023-02-09 MED ORDER — CEFAZOLIN SODIUM-DEXTROSE 2-4 GM/100ML-% IV SOLN
INTRAVENOUS | Status: AC
  Filled 2023-02-09: qty 100

## 2023-02-09 MED ORDER — BUPIVACAINE HCL (PF) 0.25 % IJ SOLN
INTRAMUSCULAR | Status: DC | PRN
  Administered 2023-02-09: 30 mL

## 2023-02-09 MED ORDER — SODIUM CHLORIDE 0.9 % IV SOLN: INTRAVENOUS | Status: DC

## 2023-02-09 MED ORDER — FENTANYL CITRATE (PF) 100 MCG/2ML IJ SOLN
INTRAMUSCULAR | Status: AC
  Filled 2023-02-09: qty 2

## 2023-02-09 MED ORDER — CEFAZOLIN IV (FOR PTA / DISCHARGE USE ONLY): 2.0000 g | Freq: Three times a day (TID) | INTRAVENOUS | 0 refills | Status: AC

## 2023-02-09 MED ORDER — HEPARIN (PORCINE) IN NACL 1000-0.9 UT/500ML-% IV SOLN
INTRAVENOUS | Status: DC | PRN
  Administered 2023-02-09: 500 mL

## 2023-02-09 MED ORDER — SODIUM CHLORIDE 0.9 % IV SOLN
80.0000 mg | INTRAVENOUS | Status: AC
  Administered 2023-02-09: 80 mg

## 2023-02-09 MED ORDER — POTASSIUM CHLORIDE CRYS ER 20 MEQ PO TBCR
20.0000 meq | EXTENDED_RELEASE_TABLET | Freq: Two times a day (BID) | ORAL | Status: DC
  Administered 2023-02-10: 20 meq via ORAL
  Filled 2023-02-09: qty 1

## 2023-02-09 MED ORDER — SACUBITRIL-VALSARTAN 24-26 MG PO TABS
1.0000 | ORAL_TABLET | Freq: Two times a day (BID) | ORAL | Status: DC
  Administered 2023-02-10: 1 via ORAL
  Filled 2023-02-09: qty 1

## 2023-02-09 MED ORDER — PANTOPRAZOLE SODIUM 40 MG PO TBEC
40.0000 mg | DELAYED_RELEASE_TABLET | Freq: Every day | ORAL | Status: DC
  Administered 2023-02-10: 40 mg via ORAL
  Filled 2023-02-09: qty 1

## 2023-02-09 MED ORDER — BUPIVACAINE HCL (PF) 0.25 % IJ SOLN
INTRAMUSCULAR | Status: AC
  Filled 2023-02-09: qty 30

## 2023-02-09 MED ORDER — ALBUTEROL SULFATE (2.5 MG/3ML) 0.083% IN NEBU: 3.0000 mL | INHALATION_SOLUTION | Freq: Every evening | RESPIRATORY_TRACT | Status: DC | PRN

## 2023-02-09 MED ORDER — PROPOFOL 10 MG/ML IV BOLUS
INTRAVENOUS | Status: DC | PRN
  Administered 2023-02-09: 70 mg via INTRAVENOUS
  Administered 2023-02-09: 50 mg via INTRAVENOUS

## 2023-02-09 MED ORDER — CARVEDILOL 3.125 MG PO TABS
3.1250 mg | ORAL_TABLET | Freq: Two times a day (BID) | ORAL | Status: DC
  Administered 2023-02-10: 3.125 mg via ORAL
  Filled 2023-02-09: qty 1

## 2023-02-09 MED ORDER — POVIDONE-IODINE 10 % EX SWAB
2.0000 | Freq: Once | CUTANEOUS | Status: AC
  Administered 2023-02-09: 2 via TOPICAL

## 2023-02-09 MED ORDER — PHENYLEPHRINE HCL-NACL 20-0.9 MG/250ML-% IV SOLN
INTRAVENOUS | Status: DC | PRN
  Administered 2023-02-09: 20 ug/min via INTRAVENOUS

## 2023-02-09 MED ORDER — ONDANSETRON HCL 4 MG/2ML IJ SOLN
INTRAMUSCULAR | Status: DC | PRN
  Administered 2023-02-09: 4 mg via INTRAVENOUS

## 2023-02-09 MED ORDER — FENTANYL CITRATE (PF) 250 MCG/5ML IJ SOLN
INTRAMUSCULAR | Status: DC | PRN
  Administered 2023-02-09: 25 ug via INTRAVENOUS
  Administered 2023-02-09: 100 ug via INTRAVENOUS

## 2023-02-09 MED ORDER — FENTANYL CITRATE (PF) 100 MCG/2ML IJ SOLN
25.0000 ug | INTRAMUSCULAR | Status: DC | PRN
  Administered 2023-02-09: 50 ug via INTRAVENOUS

## 2023-02-09 MED ORDER — ROCURONIUM BROMIDE 10 MG/ML (PF) SYRINGE
PREFILLED_SYRINGE | INTRAVENOUS | Status: DC | PRN
  Administered 2023-02-09: 10 mg via INTRAVENOUS
  Administered 2023-02-09: 100 mg via INTRAVENOUS

## 2023-02-09 MED ORDER — CHLORHEXIDINE GLUCONATE 0.12 % MT SOLN
OROMUCOSAL | Status: AC
  Administered 2023-02-09: 15 mL
  Filled 2023-02-09: qty 15

## 2023-02-09 MED ORDER — ATORVASTATIN CALCIUM 80 MG PO TABS: 80.0000 mg | ORAL_TABLET | Freq: Every day | ORAL | Status: DC

## 2023-02-09 MED ORDER — LIDOCAINE 2% (20 MG/ML) 5 ML SYRINGE
INTRAMUSCULAR | Status: DC | PRN
  Administered 2023-02-09: 100 mg via INTRAVENOUS

## 2023-02-09 MED ORDER — CEFAZOLIN SODIUM-DEXTROSE 2-4 GM/100ML-% IV SOLN
2.0000 g | INTRAVENOUS | Status: AC
  Administered 2023-02-09: 2 g via INTRAVENOUS
  Filled 2023-02-09: qty 100

## 2023-02-09 MED ORDER — ONDANSETRON HCL 4 MG/2ML IJ SOLN: 4.0000 mg | Freq: Four times a day (QID) | INTRAMUSCULAR | Status: DC | PRN

## 2023-02-09 MED ORDER — DEXAMETHASONE SODIUM PHOSPHATE 10 MG/ML IJ SOLN
INTRAMUSCULAR | Status: DC | PRN
  Administered 2023-02-09: 5 mg via INTRAVENOUS

## 2023-02-09 MED ORDER — ONDANSETRON HCL 4 MG/2ML IJ SOLN
4.0000 mg | Freq: Once | INTRAMUSCULAR | Status: AC | PRN
Start: 2023-02-09 — End: 2023-02-09
  Administered 2023-02-09: 4 mg via INTRAVENOUS

## 2023-02-09 MED ORDER — ACETAMINOPHEN 10 MG/ML IV SOLN: 1000.0000 mg | Freq: Once | INTRAVENOUS | Status: DC | PRN

## 2023-02-09 MED ORDER — SODIUM CHLORIDE 0.9 % IV SOLN
INTRAVENOUS | Status: AC
  Filled 2023-02-09: qty 2

## 2023-02-09 MED ORDER — ACETAMINOPHEN 325 MG PO TABS
325.0000 mg | ORAL_TABLET | ORAL | Status: DC | PRN
Start: 2023-02-09 — End: 2023-02-10
  Administered 2023-02-09 – 2023-02-10 (×2): 325 mg via ORAL
  Filled 2023-02-09: qty 1
  Filled 2023-02-09: qty 2

## 2023-02-09 MED ORDER — SODIUM CHLORIDE 0.9% FLUSH: 3.0000 mL | INTRAVENOUS | Status: DC | PRN

## 2023-02-09 MED ORDER — CHLORHEXIDINE GLUCONATE 4 % EX SOLN
4.0000 | Freq: Once | CUTANEOUS | Status: DC
  Filled 2023-02-09: qty 60

## 2023-02-09 SURGICAL SUPPLY — 15 items
BALLN OCL BRIDGE 80X90X.9 60 (BALLOONS) ×1
BALLOON OCL BRIDGE 80X90X.9 60 (BALLOONS) IMPLANT
BRIDGE PREP KIT (KITS) ×1
DEVICE LCKNG LEAD CARDIAC (CATHETERS) IMPLANT
KIT BRIDGE PREP (KITS) IMPLANT
KIT LEAD ACCESSORY 6056M PINCH (KITS) IMPLANT
KIT WRENCH (KITS) IMPLANT
MAT PREVALON FULL STRYKER (MISCELLANEOUS) IMPLANT
PACEMAKER STYLET BLUE (MISCELLANEOUS) IMPLANT
PAD DEFIB RADIO PHYSIO CONN (PAD) ×1 IMPLANT
REMOVAL LLD CARDIAC LEAD EZ (CATHETERS) ×2
SHEATH LASER 16 FR GLIDELIGHT (SHEATH) IMPLANT
SHEATH TIGHTRAIL MECH 13F (SHEATH) IMPLANT
STYLET LOCKING LLD E (CATHETERS) IMPLANT
TRAY PACEMAKER INSERTION (PACKS) ×1 IMPLANT

## 2023-02-09 NOTE — Transfer of Care (Signed)
Immediate Anesthesia Transfer of Care Note  Patient: [name]  Procedure(s) Performed: LEAD EXTRACTION TRANSESOPHAGEAL ECHOCARDIOGRAM  Patient Location: PACU  Anesthesia Type:General  Level of Consciousness: awake, alert , and oriented  Airway & Oxygen Therapy: Patient Spontanous Breathing  Post-op Assessment: Report given to RN and Post -op Vital signs reviewed and stable  Post vital signs: Reviewed and stable  Last Vitals:  Vitals Value Taken Time  BP 128/68 02/09/23 1745  Temp 36.8 C 02/09/23 1745  Pulse 91 02/09/23 1749  Resp 14 02/09/23 1749  SpO2 96 % 02/09/23 1749  Vitals shown include unfiled device data.  Last Pain:  Vitals:   02/09/23 1203  TempSrc:   PainSc: 0-No pain         Complications: There were no known notable events for this encounter.

## 2023-02-09 NOTE — Progress Notes (Signed)
PHARMACY CONSULT NOTE FOR:  OUTPATIENT  PARENTERAL ANTIBIOTIC THERAPY (OPAT)  Indication: ICD infection  Regimen: Cefazolin 2 gm IV Q 8 hours  End date: 03/09/23  IV antibiotic discharge orders are pended. To discharging provider:  please sign these orders via discharge navigator,  Select New Orders & click on the button choice - Manage This Unsigned Work.     Thank you for allowing pharmacy to be a part of this patient's care.  Sharin Mons, PharmD, BCPS, BCIDP Infectious Diseases Clinical Pharmacist Phone: [phone] 02/09/2023, 3:30 PM

## 2023-02-09 NOTE — Consult Note (Cosign Needed Addendum)
[facility]    Date of Admission:  02/09/2023     Total days of antibiotics 0              Reason for Consult: Infected cardiac device    Referring Provider: Drue Castro (ID continuity patient)  Primary Care Provider: Storm Frisk, MD   Assessment: [name] is a 73 y.o. female admitted from home with:   Infected Cardiac Device -  Staphylococcal Capitis -  Undergoing system extraction with [name] today for device infection - recent generator change but prior to change she did describe pain over the pocket site.  CRP significantly elevated 53.8  Planning 4 week course of cefazolin for this infection with EOT 03/09/23.   Vascular Access -  -PICC ordered - will be placed after extraction today.  -Home health orders to maintain PICC line care and education for patient described below   Discharge Planning / Coordination of Care -  -Outpatient antibiotics set -Discussed with [name], ID pharmacy team - planning only overnight obs to coordinate PICC and abx.   Medication Monitoring -  -Safety labs ordered and detailed below to be followed in OPAT clinic    Plan: PICC line after extraction for long term home abx  IV OPAT as outlined below.    OPAT ORDERS:  Diagnosis: Cardiac device infection s/p extraction   Culture Result: Staph Capitis (pansensitive)  Allergies  Allergen Reactions   Voltaren [Diclofenac] Rash     Discharge antibiotics to be given via PICC line:  Cefazolin 2 gm IV TID    Duration: 4 weeks   End Date: Feb 21   [facility] Care Per Protocol with [employer] Use: Home health RN for IV administration and teaching, line care and labs.    Labs weekly while on IV antibiotics: _x_ CBC with differential __ BMP **TWICE WEEKLY ON VANCOMYCIN  _x_ CMP _x_ CRP _x_ ESR __ Vancomycin trough TWICE WEEKLY __ CK  _x_ Please pull PIC at completion of IV antibiotics __ Please leave PIC in place until doctor has  seen patient or been notified  Fax weekly labs to [phone]  Clinic Follow Up Appt: 2/21 @ 9:30 am with [name]    Active Problems:   Infection associated with cardiac device (HCC)    sodium chloride   Intravenous Once   chlorhexidine  4 Application Topical Once   gentamicin (GARAMYCIN) 80 mg in sodium chloride 0.9 % 500 mL irrigation  80 mg Irrigation On Call   sodium chloride 0.9 % with gentamicin (GARAMYCIN) ADS Med       sodium chloride flush  3-10 mL Intravenous Q12H    HPI: [name] is a 73 y.o. female admitted under observation for extraction of infected cardiac device.   PMHx chronic systolic dysfunction s/p Medtronic CRT-D implanted 2015, NICM, LBBB, CVA.   She underwent replacement of generator for Medtronic CRT-D system on 01/18/23. Since the procedure she has had some soreness and drainage - [name] did a culture swab that grew out few staphylococcal capitis. No changes to the skin overlying the generator prior to exchange, though she did have some pain she d/w [name] before. No systemic symptoms of infection noted. She saw my partner [name] on 01/25/23 in clinic.   She has not had PICC line placed yet.   Review of Systems: ROS  Past Medical History:  Diagnosis Date   A-fib (HCC)  Acute kidney injury superimposed on chronic kidney disease (HCC) 11/21/2021   Arthritis    Automatic implantable cardioverter-defibrillator in situ    Cardiac resynchronization therapy defibrillator (CRT-D) in place    a. placed on 11/05/13.   GERD (gastroesophageal reflux disease)    History of hiatal hernia    HLD (hyperlipidemia)    Hyperkalemia 11/21/2021   Hypotension    LBBB (left bundle branch block)    Mitral regurgitation    a. Mild-mod MR by echo 07/2013.   PONV (postoperative nausea and vomiting)    Stage 3a chronic kidney disease (HCC) 02/22/2021   Stroke (HCC) 09/23/2013   Systolic CHF (HCC)    a. Dx 07/2013 - due to NICM. Cath normal cors. EF  25%.    Social History   Tobacco Use   Smoking status: Never   Smokeless tobacco: Never  Vaping Use   Vaping status: Never Used  Substance Use Topics   Alcohol use: No    Alcohol/week: 0.0 standard drinks of alcohol   Drug use: No    Family History  Problem Relation Age of Onset   Congestive Heart Failure Mother    Cancer Mother 61       Breast   Cancer Father 75       Oat Cell CA   Lupus Sister    Heart disease Brother    Allergies  Allergen Reactions   Voltaren [Diclofenac] Rash    OBJECTIVE: Blood pressure 132/71, pulse 85, temperature 98.2 F (36.8 C), temperature source Oral, resp. rate 18, height 5\' 4"  (1.626 m), weight 78 kg, SpO2 93%.  Physical Exam  Lab Results Lab Results  Component Value Date   WBC 10.8 01/25/2023   HGB 13.1 01/25/2023   HCT 41.3 01/25/2023   MCV 87.7 01/25/2023   PLT 303 01/25/2023    Lab Results  Component Value Date   CREATININE 0.98 01/25/2023   BUN 10 01/25/2023   NA 138 01/25/2023   K 3.7 01/25/2023   CL 101 01/25/2023   CO2 22 01/25/2023    Lab Results  Component Value Date   ALT 13 11/21/2021   AST 15 11/21/2021   ALKPHOS 113 11/21/2021   BILITOT 1.3 (H) 11/21/2021     Microbiology: No results found for this or any previous visit (from the past 240 hours).  Danielle Alberts, MSN, NP-C [facility] for Infectious Disease [facility] Pager: [phone]  02/09/2023 3:26 PM

## 2023-02-09 NOTE — Progress Notes (Signed)
  Echocardiogram Echocardiogram Transesophageal has been performed.  Delcie Roch 02/09/2023, 4:57 PM

## 2023-02-09 NOTE — Anesthesia Preprocedure Evaluation (Signed)
Anesthesia Evaluation  Patient identified by MRN, date of birth, ID band Patient awake    Reviewed: Allergy & Precautions, NPO status , Patient's Chart, lab work & pertinent test results, reviewed documented beta blocker date and time   History of Anesthesia Complications (+) history of anesthetic complications  Airway Mallampati: II  TM Distance: >3 FB Neck ROM: Full    Dental  (+) Poor Dentition,    Pulmonary neg COPD, neg PE   breath sounds clear to auscultation       Cardiovascular (-) hypertension+CHF (EF35-40%)  + dysrhythmias + Cardiac Defibrillator  Rhythm:Regular Rate:Normal     Neuro/Psych CVA, Residual Symptoms    GI/Hepatic hiatal hernia,GERD  ,,  Endo/Other    Renal/GU CRFRenal disease     Musculoskeletal  (+) Arthritis ,    Abdominal   Peds  Hematology   Anesthesia Other Findings   Reproductive/Obstetrics                             Anesthesia Physical Anesthesia Plan  ASA: 3  Anesthesia Plan: General   Post-op Pain Management:    Induction: Intravenous  PONV Risk Score and Plan: 2 and Ondansetron  Airway Management Planned:   Additional Equipment:   Intra-op Plan:   Post-operative Plan: Extubation in OR  Informed Consent: I have reviewed the patients History and Physical, chart, labs and discussed the procedure including the risks, benefits and alternatives for the proposed anesthesia with the patient or authorized representative who has indicated his/her understanding and acceptance.     Dental advisory given  Plan Discussed with: CRNA  Anesthesia Plan Comments:        Anesthesia Quick Evaluation

## 2023-02-09 NOTE — Interval H&P Note (Signed)
History and Physical Interval Note:  02/09/2023 11:39 AM  [name]  has presented today for surgery, with the diagnosis of lead malfunction.  The various methods of treatment have been discussed with the patient and family. After consideration of risks, benefits and other options for treatment, the patient has consented to  Procedure(s): LEAD EXTRACTION (N/A) as a surgical intervention.  The patient's history has been reviewed, patient examined, no change in status, stable for surgery.  I have reviewed the patient's chart and labs.  Questions were answered to the patient's satisfaction.     [name]

## 2023-02-09 NOTE — Progress Notes (Signed)
Pt states she "rise" her mouth with some pickle juice around 0730 am. Pt denies drinking it; per pt it was clear. Dr. Ace Gins is aware

## 2023-02-09 NOTE — Interval H&P Note (Signed)
History and Physical Interval Note:  02/09/2023 11:40 AM  [name]  has presented today for surgery, with the diagnosis of lead malfunction.  The various methods of treatment have been discussed with the patient and family. After consideration of risks, benefits and other options for treatment, the patient has consented to  Procedure(s): LEAD EXTRACTION (N/A) as a surgical intervention.  The patient's history has been reviewed, patient examined, no change in status, stable for surgery.  I have reviewed the patient's chart and labs.  Questions were answered to the patient's satisfaction.     Valin Massie T Jaman Aro

## 2023-02-09 NOTE — Anesthesia Procedure Notes (Signed)
Procedure Name: Intubation Date/Time: 02/09/2023 1:50 PM  Performed by: Aundria Rud, CRNAPre-anesthesia Checklist: Patient identified, Patient being monitored, Timeout performed, Emergency Drugs available and Suction available Patient Re-evaluated:Patient Re-evaluated prior to induction Oxygen Delivery Method: Circle system utilized Preoxygenation: Pre-oxygenation with 100% oxygen Induction Type: IV induction Ventilation: Mask ventilation without difficulty Laryngoscope Size: Mac and 3 Grade View: Grade I Tube type: Oral Tube size: 7.5 mm Number of attempts: 1 Airway Equipment and Method: Stylet Placement Confirmation: ETT inserted through vocal cords under direct vision, positive ETCO2 and breath sounds checked- equal and bilateral Secured at: 21 cm Tube secured with: Tape Dental Injury: Teeth and Oropharynx as per pre-operative assessment

## 2023-02-10 ENCOUNTER — Ambulatory Visit (HOSPITAL_COMMUNITY): Payer: 59

## 2023-02-10 DIAGNOSIS — T827XXD Infection and inflammatory reaction due to other cardiac and vascular devices, implants and grafts, subsequent encounter: Secondary | ICD-10-CM | POA: Diagnosis not present

## 2023-02-10 DIAGNOSIS — I5020 Unspecified systolic (congestive) heart failure: Secondary | ICD-10-CM

## 2023-02-10 DIAGNOSIS — I10 Essential (primary) hypertension: Secondary | ICD-10-CM | POA: Insufficient documentation

## 2023-02-10 DIAGNOSIS — T827XXA Infection and inflammatory reaction due to other cardiac and vascular devices, implants and grafts, initial encounter: Secondary | ICD-10-CM | POA: Diagnosis not present

## 2023-02-10 LAB — BASIC METABOLIC PANEL
Anion gap: 9 (ref 5–15)
BUN: 15 mg/dL (ref 8–23)
CO2: 21 mmol/L — ABNORMAL LOW (ref 22–32)
Calcium: 8.7 mg/dL — ABNORMAL LOW (ref 8.9–10.3)
Chloride: 103 mmol/L (ref 98–111)
Creatinine, Ser: 1.06 mg/dL — ABNORMAL HIGH (ref 0.44–1.00)
GFR, Estimated: 55 mL/min — ABNORMAL LOW (ref >60)
Glucose, Bld: 108 mg/dL — ABNORMAL HIGH (ref 70–99)
Potassium: 3.3 mmol/L — ABNORMAL LOW (ref 3.5–5.1)
Sodium: 133 mmol/L — ABNORMAL LOW (ref 135–145)

## 2023-02-10 LAB — CBC
HCT: 31.5 % — ABNORMAL LOW (ref 36.0–46.0)
Hemoglobin: 10.3 g/dL — ABNORMAL LOW (ref 12.0–15.0)
MCH: 28.3 pg (ref 26.0–34.0)
MCHC: 32.7 g/dL (ref 30.0–36.0)
MCV: 86.5 fL (ref 80.0–100.0)
Platelets: 237 10*3/uL (ref 150–400)
RBC: 3.64 MIL/uL — ABNORMAL LOW (ref 3.87–5.11)
RDW: 15.1 % (ref 11.5–15.5)
WBC: 17.8 10*3/uL — ABNORMAL HIGH (ref 4.0–10.5)
nRBC: 0 % (ref 0.0–0.2)

## 2023-02-10 LAB — SEDIMENTATION RATE: Sed Rate: 38 mm/h — ABNORMAL HIGH (ref 0–22)

## 2023-02-10 LAB — C-REACTIVE PROTEIN: CRP: 1.9 mg/dL — ABNORMAL HIGH (ref <1.0)

## 2023-02-10 MED ORDER — HEPARIN SOD (PORK) LOCK FLUSH 100 UNIT/ML IV SOLN
250.0000 [IU] | Freq: Once | INTRAVENOUS | Status: AC
  Administered 2023-02-10: 250 [IU] via INTRAVENOUS

## 2023-02-10 MED ORDER — SODIUM CHLORIDE 0.9% FLUSH: 10.0000 mL | INTRAVENOUS | Status: DC | PRN

## 2023-02-10 MED ORDER — CAMPHOR-MENTHOL 0.5-0.5 % EX LOTN
TOPICAL_LOTION | CUTANEOUS | Status: DC | PRN
  Filled 2023-02-10: qty 222

## 2023-02-10 MED ORDER — CEFAZOLIN SODIUM-DEXTROSE 2-4 GM/100ML-% IV SOLN
2.0000 g | Freq: Three times a day (TID) | INTRAVENOUS | Status: DC
  Administered 2023-02-10: 2 g via INTRAVENOUS
  Filled 2023-02-10: qty 100

## 2023-02-10 MED ORDER — SODIUM CHLORIDE 0.9% FLUSH
10.0000 mL | Freq: Two times a day (BID) | INTRAVENOUS | Status: DC
Start: 2023-02-10 — End: 2023-02-10
  Administered 2023-02-10: 20 mL

## 2023-02-10 MED ORDER — CHLORHEXIDINE GLUCONATE CLOTH 2 % EX PADS: 6.0000 | MEDICATED_PAD | Freq: Every day | CUTANEOUS | Status: DC

## 2023-02-10 NOTE — Discharge Summary (Signed)
ELECTROPHYSIOLOGY PROCEDURE DISCHARGE SUMMARY    Patient ID: [name] MRN: [id number]; DOB: Sep 01, 1950  Admit date: 02/09/2023 Discharge date: 02/10/2023  PCP:  Storm Frisk, MD   [facility] Providers Cardiologist:  None  Electrophysiologist:  Sherryl Manges, MD  Advanced Heart Failure:  [name], MD     Discharge Diagnoses    Principal Problem:   ICD (implantable cardioverter-defibrillator) infection [facility] [place]) Active Problems:   Hyperlipidemia   Heart failure with reduced ejection fraction [facility])- s/p ICD and pacemaker    Infection associated with cardiac device [facility])   Hypertension  Diagnostic Studies/Procedures    02/09/23 Lead Extraction and TEE  CONCLUSIONS:   1. Chronic systolic heart failure with an CRT-D in situ  2. CRT-D pocket infection  3. Successful CRT-D system extraction  4. TEE showing small pericardial effusion at baseline. Unchanged findings after device extraction  5.  No early apparent complications.   6. Hold Plavix for 5 days (OK to restart 02/15/2023)  7. Infectious disease consult today. PICC line ordered. Patient to receive outpatient IV antibiotics per ID recommendations. _____________   History of Present Illness     [name] is a 73 y.o. female with chronic systolic CHF with nonischemic cardiomypathy (no significant CAD) s/p [employer] CRT-D, LBBB, mild-mod MR and CVA (09/2013).   Patient was recently seen by Dr. Lalla Brothers at the request of Dr. Graciela Husbands for evaluation of CIED associated infection. She underwent generator replacement January 18, 2023. The patient reported pocket pain at the time of generator replacement. For this reason, cultures were taken of the pocket. These cultures returned positive for staph capitis. The patient was referred to infectious disease and saw Dr. Marcha Dutton in January 25, 2023. Dr. Graciela Husbands discussed her case with Dr. Drue Second. After thoroughly going through the case, Dr. Drue Second recommended device  removal/extraction.   Dr. Lalla Brothers saw patient 02/01/23 and plans maade for CRT-D extraction.   Hospital Course     Consultants: Infectious Disease   CRT-D-CIED associated infection Staphylococcal Capitis  As noted above, patient referred for ICD extraction in the setting of positive pocket culture after recent generator change. At her office visit prior to procedure, patient's device VVI reprogrammed to 40 with confirmation that she was not dependent on device. She underwent extraction successfully with Dr. Lalla Brothers on 02/08/22.  Following procedure, patient seen by ID with recommendation for 4 week course of Cefazolin with EOT 03/09/23.  PICC line placed and abx coordinated with pharmacy.  Cefazolin 2g q8hr x28 days  Chronic Systolic Heart Failure  Echo (7/82) showed EF 35-40%. Volume status stable on day of discharge.  Continue Coreg 3.125mg  BID Continue Entresto 24-26mg  BID Continue Spironolactone 25mg  Continue Farxiga 10mg   Continue Lasix 80mg  daily  S/P CVA  Patient with hx several small cerebrovascular events. Suspected small vessel etiology.  Resume Plavix  Hyperlipidemia  Continue home statin.  Hypokalemia  Continue home potassium supplementation.   Did the patient have an acute coronary syndrome (MI, NSTEMI, STEMI, etc) this admission?:  No                               Did the patient have a percutaneous coronary intervention (stent / angioplasty)?:  No.    _____________  Discharge Vitals Blood pressure 130/65, pulse 87, temperature 98.1 F (36.7 C), temperature source Oral, resp. rate 19, height 5\' 4"  (1.626 m), weight 77.7 kg, SpO2 97%.  Filed Weights  02/07/23 1645 02/09/23 1136 02/09/23 1941  Weight: 78.9 kg 78 kg 77.7 kg   See attestation for physical exam.  Labs & Radiologic Studies    CBC Recent Labs    02/10/23 1228  WBC 17.8*  HGB 10.3*  HCT 31.5*  MCV 86.5  PLT 237   Basic Metabolic Panel Recent Labs    56/38/75 1228  NA 133*  K  3.3*  CL 103  CO2 21*  GLUCOSE 108*  BUN 15  CREATININE 1.06*  CALCIUM 8.7*   Liver Function Tests No results for input(s): "AST", "ALT", "ALKPHOS", "BILITOT", "PROT", "ALBUMIN" in the last 72 hours. No results for input(s): "LIPASE", "AMYLASE" in the last 72 hours. High Sensitivity Troponin:   No results for input(s): "TROPONINIHS" in the last 720 hours.  BNP Invalid input(s): "POCBNP" D-Dimer No results for input(s): "DDIMER" in the last 72 hours. Hemoglobin A1C No results for input(s): "HGBA1C" in the last 72 hours. Fasting Lipid Panel No results for input(s): "CHOL", "HDL", "LDLCALC", "TRIG", "CHOLHDL", "LDLDIRECT" in the last 72 hours. Thyroid Function Tests No results for input(s): "TSH", "T4TOTAL", "T3FREE", "THYROIDAB" in the last 72 hours.  Invalid input(s): "FREET3" _____________  DG Chest 2 View Result Date: 02/10/2023 CLINICAL DATA:  ICD infection. EXAM: CHEST - 2 VIEW COMPARISON:  03/30/2022 FINDINGS: The cardio pericardial silhouette is enlarged. The lungs are clear without focal pneumonia, edema, pneumothorax or pleural effusion. Left-sided permanent pacemaker/AICD seen previously has been retrieved in the interval. Telemetry leads overlie the chest. IMPRESSION: Interval retrieval of left-sided permanent pacemaker/AICD. No acute cardiopulmonary findings. Electronically Signed   By: Kennith Center M.D.   On: 02/10/2023 10:25   EP PPM/ICD IMPLANT Result Date: 02/09/2023  CONCLUSIONS:  1. Chronic systolic heart failure with an CRT-D in situ  2. CRT-D pocket infection  3. Successful CRT-D system extraction  4. TEE showing small pericardial effusion at baseline. Unchanged findings after device extraction  5.  No early apparent complications.  6. Hold Plavix for 5 days (OK to restart 02/15/2023)  7. Infectious disease consult today. PICC line ordered. Patient to receive outpatient IV antibiotics per ID recommendations.   EP STUDY Result Date: 02/09/2023  CONCLUSIONS:  1.  Chronic systolic heart failure with an CRT-D in situ  2. CRT-D pocket infection  3. Successful CRT-D system extraction  4. TEE showing small pericardial effusion at baseline. Unchanged findings after device extraction  5.  No early apparent complications.  6. Hold Plavix for 5 days (OK to restart 02/15/2023)  7. Infectious disease consult today. PICC line ordered. Patient to receive outpatient IV antibiotics per ID recommendations.   [place] EKG SITE RITE Result Date: 02/09/2023 If [facility] image not attached, placement could not be confirmed due to current cardiac rhythm.  CT LEAD EXTRACTION W/CONTRAST Addendum Date: 02/02/2023 ADDENDUM REPORT: 02/02/2023 18:05 EXAM: OVER-READ INTERPRETATION  CT CHEST The following report is an over-read performed by radiologist Dr. Schuyler Amor [facility] on 02/02/2023. This over-read does not include interpretation of cardiac or coronary anatomy or pathology. The CT lead extraction interpretation by the cardiologist is attached. COMPARISON:  None FINDINGS: Vascular: No acute abnormality. Left chest wall ICD noted with leads in the coronary sinus, right ventricle and right atrium. Aortic atherosclerosis noted. Heart size appears normal. No pericardial effusion. Mediastinum/nodes: Enhancing nodule within the left lobe of thyroid gland measures 3.1 cm. This has been evaluated on previous imaging. (ref: J Am Coll Radiol. 2015 Feb;12(2): 143-50). The trachea appears patent and midline. Esophagus appears  patulous which may reflect sequelae of chronic dysmotility. No enlarged supraclavicular, axillary, mediastinal, or hilar lymph nodes. Lungs/pleura: No pleural effusion identified. No interstitial edema or airspace consolidation. Linear areas of scarring noted within the medial left apex, lingula and right middle lobe. Subpleural nodule overlying the lateral right lower lobe measures 4 mm, image 58/6. Posterior right lower lobe lung nodule measures 4 mm, image 65/6.  Upper abdomen: Small hiatal hernia. No acute findings within the imaged portions of the upper abdomen. Musculoskeletal: No chest wall mass identified. No acute or suspicious osseous findings. T9 vertebral hemangioma. No acute or suspicious osseous findings. IMPRESSION: 1. No acute findings within the chest. 2. Small right lower lobe lung nodules measuring up to 4 mm. No follow-up needed if patient is low-risk. Non-contrast chest CT can be considered in 12 months if patient is high-risk. This recommendation follows the consensus statement: Guidelines for Management of Incidental Pulmonary Nodules Detected on CT Images: From the Fleischner Society 2017; Radiology 2017; 284:228-243. 3. Small hiatal hernia. 4.  Aortic Atherosclerosis (ICD10-I70.0). Electronically Signed   By: Signa Kell M.D.   On: 02/02/2023 18:05   Result Date: 02/02/2023 CLINICAL DATA:  Cardiovascular Implantable Electronic Device (CIED) lead extraction Planning EXAM: Gated Cardiac CT cardiac TECHNIQUE: A 120 kV prospective scan was performed 50 seconds after contrast injection to optimize venous structures. The field of view included the subclavian veins to the heart. Gantry rotation speed was 250 msecs and collimation was 0.6 mm. The 3D data set was reconstructed in 5% intervals of the 55-75 % of the R-R cycle. Diastolic phases were analyzed on a dedicated work station using MPR, MIP and VRT modes. The patient received 80 cc of contrast. FINDINGS: Image quality: good. Noise artifact is: Duplication vs interpolation artifact. CIED: Course: There is a left side CIED. There are 3 CIED leads present that course through the left subclavian vein, left brachiocephalic vein, and superior vena cava. There is a single lead that terminates in the right atrium, and a single lead that terminates in the right ventricle. A third lead terminates in the coronary sinus. The relationship between CIED leads is further detailed below: Superior Vena Cava: There are  3 CIED that course through the SVC. The CIED leads are located centrally within the lumen. The CIED leads touch the wall of the SVC for >= 1 cm (16 mm in length). Right Atrium: There is a CIED lead that is embedded within the RA appendage. The CIED does not terminate beyond the RA wall. Right Ventricle: There is a CIED lead the is embedded within the RV apex free wall. The CIED lead does not terminate beyond the RV wall. Coronary sinus: There is a CIED lead in the coronary sinus and terminates a high anterolateral branch. The CIED lead is within the lumen, cannot fully evaluate if embedded within the venous wall. CS lead does not terminate beyond the venous wall. Lead Fracture: No CIED lead fractures were noted. Thrombus: No CIED lead thrombus was detected. Central Veins: The subclavian vein, brachiocephalic vein, and superior vena cava are patent without stenoses. Right Atrium: Right atrial size is within normal limits. Right Ventricle: The right ventricular cavity is within normal limits. Left Atrium: Left atrial size is dilated. Left Ventricle: The ventricular cavity size is within normal limits. Pulmonary arteries: Normal in size. Pulmonary veins: Normal pulmonary venous drainage. Pericardium: Normal thickness with no significant effusion or calcium present. Aorta: Normal caliber without significant disease. Extra-cardiac findings: See attached radiology report for non-cardiac  structures. Abnormalities of the left lobe of the thyroid will be commented on in radiologist report. IMPRESSION: 1. The CIED leads are located centrally within the SVC, and touches the wall of the SVC for 16 mm. 2. A CIED lead is embedded within the RA wall without high-risk features for extraction. 3. A CIED lead is embedded within the RV apex without high-risk features for extraction. 4. No lead fractures or CIED lead thrombus detected. 5. Overall, these findings are moderate risk for CIED lead extraction. Weston Brass, MD  Electronically Signed: By: Weston Brass M.D. On: 02/02/2023 16:54   CUP PACEART INCLINIC DEVICE CHECK Result Date: 02/01/2023 Full device interrogation not performed. Underlying rhythm AS/VS 88. Pt reprogrammed VVI 40.Jenny Smith, BSN, RN  Disposition   Pt is being discharged home today in good condition.  Follow-up Plans & Appointments     Follow-up Information     Comer, Belia Heman, MD Follow up on 03/09/2023.   Specialty: Infectious Diseases Why: Hospital Discharge Follow Up 9:30 am appointment Contact information: [address] [place] [address] [phone]                Discharge Instructions     [facility] pharmacist to adjust dose for Vancomycin, Aminoglycosides and other anti-infective therapies as requested by physician.   Complete by: As directed    [facility] to provide Cath Flo 2mg    Complete by: As directed    Administer for PICC line occlusion and as ordered by physician for other access device issues.   Anaphylaxis Kit: Provided to treat any anaphylactic reaction to the medication being provided to the patient if First Dose or when requested by physician   Complete by: As directed    Epinephrine 1mg /ml vial / amp: Administer 0.3mg  (0.50ml) subcutaneously once for moderate to severe anaphylaxis, nurse to call physician and pharmacy when reaction occurs and call 911 if needed for immediate care   Diphenhydramine 50mg /ml IV vial: Administer 25-50mg  IV/IM PRN for first dose reaction, rash, itching, mild reaction, nurse to call physician and pharmacy when reaction occurs   Sodium Chloride 0.9% NS IV: Administer if needed for hypovolemic blood pressure drop or as ordered by physician after call to physician with anaphylactic reaction   Change dressing on IV access line weekly and PRN   Complete by: As directed    Flush IV access with Sodium Chloride 0.9% and Heparin 10 units/ml or 100 units/ml   Complete by: As  directed    Home infusion instructions - [facility]   Complete by: As directed    Instructions: Flush IV access with Sodium Chloride 0.9% and Heparin 10units/ml or 100units/ml   Change dressing on IV access line: Weekly and PRN   Instructions Cath Flo 2mg : Administer for PICC Line occlusion and as ordered by physician for other access device   [facility] pharmacist to adjust dose for: Vancomycin, Aminoglycosides and other anti-infective therapies as requested by physician   Method of administration may be changed at the discretion of home infusion pharmacist based upon assessment of the patient and/or caregiver's ability to self-administer the medication ordered   Complete by: As directed         Discharge Medications   Allergies as of 02/10/2023       Reactions   Voltaren [diclofenac] Rash        Medication List     TAKE these medications    albuterol 108 (90 Base) MCG/ACT  inhaler Commonly known as: VENTOLIN HFA Inhale 2 puffs into the lungs at bedtime as needed for wheezing or shortness of breath.   atorvastatin 80 MG tablet Commonly known as: LIPITOR Take 1 tablet (80 mg total) by mouth daily at 6 PM.   carvedilol 3.125 MG tablet Commonly known as: COREG Take 1 tablet (3.125 mg total) by mouth 2 (two) times daily with a meal.   ceFAZolin IVPB Commonly known as: ANCEF Inject 2 g into the vein every 8 (eight) hours for 28 days. Indication:  ICD infection  First Dose: Yes Last Day of Therapy:  03/09/23 Labs - Once weekly:  CBC/D and BMP, Labs - Once weekly: ESR and CRP Method of administration: IV Push Method of administration may be changed at the discretion of home infusion pharmacist based upon assessment of the patient and/or caregiver's ability to self-administer the medication ordered.   cetirizine 10 MG tablet Commonly known as: ZYRTEC Take 1 tablet (10 mg total) by mouth daily.   clopidogrel 75 MG tablet Commonly known as:  PLAVIX Take 1 tablet (75 mg total) by mouth daily.   Entresto 24-26 MG Generic drug: sacubitril-valsartan Take 1 tablet by mouth 2 (two) times daily.   Farxiga 10 MG Tabs tablet Generic drug: dapagliflozin propanediol Take 1 tablet (10 mg total) by mouth daily before breakfast.   fluticasone 50 MCG/ACT nasal spray Commonly known as: FLONASE Place 2 sprays into both nostrils daily.   furosemide 40 MG tablet Commonly known as: LASIX Take 2 tablets (80 mg total) by mouth daily. What changed:  how much to take when to take this   pantoprazole 40 MG tablet Commonly known as: PROTONIX Take 1 tablet (40 mg total) by mouth daily.   potassium chloride SA 20 MEQ tablet Commonly known as: KLOR-CON M Take 2 tablets (40 mEq total) by mouth daily. What changed:  how much to take when to take this   spironolactone 25 MG tablet Commonly known as: ALDACTONE Take 1 tablet (25 mg total) by mouth daily.               Discharge Care Instructions  (From admission, onward)           Start     Ordered   02/09/23 0000  Change dressing on IV access line weekly and PRN  (Home infusion instructions - [facility] )        02/09/23 1612               Outstanding Labs/Studies   Outpatient f/u per ID  Duration of Discharge Encounter: APP Time: 25 minutes   Signed,  Laverda Page, NP

## 2023-02-10 NOTE — Discharge Instructions (Signed)
Implantable Cardiac Device Extraction, Care After  This sheet gives you information about how to care for yourself after your procedure. Your health care provider may also give you more specific instructions. If you have problems or questions, contact your health care provider.  What can I expect after the procedure? After your procedure, it is common to have: Pain or soreness at the site where the cardiac device was removed. Mild Swelling at the site where the cardiac device was inserted.  Follow these instructions at home: Incision care  Keep the incision clean and dry. Do not take baths, swim, or use a hot tub until after your wound check.  Do not shower for at least 7 days, or as directed by your health care provider. Pat the area dry with a clean towel. Do not rub the area. This may cause bleeding. Follow instructions from your health care provider about how to take care of your incision. Make sure you: Leave stitches (sutures), skin glue, or adhesive strips in place. These skin closures may need to stay in place for 2 weeks or longer. If adhesive strip edges start to loosen and curl up, you may trim the loose edges. Do not remove adhesive strips completely unless your health care provider tells you to do that. Check around your incision area every day for signs of infection. Check for: More redness, swelling, or pain. More fluid or blood. Warmth. Pus or a bad smell. Activity Do not lift anything that is heavier than 10 lb (4.5 kg) until your health care provider says it is okay to do so. For the first week, or as long as told by your health care provider: Avoid lifting your affected arm higher than your shoulder. Avoid strenuous exercise. Ask your health care provider when it is okay to: Resume your normal activities. Return to work or school. Resume sexual activity. Contact a health care provider if: You have any of these around your incision site or coming from it: More  redness, swelling, or pain. Fluid or blood. Warmth to the touch. Pus or a bad smell. You have a fever. Get help right away if: You experience chest pain that is different from the pain at the cardiac device site. You develop a red streak that extends above or below the incision site. You experience shortness of breath. You have light-headedness that does not go away quickly. You faint or have dizzy spells. Your pulse suddenly drops or increases rapidly and does not return to normal. You begin to gain weight and your legs and ankles swell. Summary After your procedure, it is common to have pain, soreness, and some swelling where the cardiac device was removed. Make sure to keep your incision clean and dry. Follow instructions from your health care provider about how to take care of your incision. Check your incision every day for signs of infection, such as more pain or swelling, pus or a bad smell, warmth, or leaking fluid and blood. Avoid strenuous exercise and lifting your left arm higher than your shoulder for 2 weeks, or as long as told by your health care provider. This information is not intended to replace advice given to you by your health care provider. Make sure you discuss any questions you have with your health care provider.

## 2023-02-10 NOTE — Anesthesia Postprocedure Evaluation (Signed)
Anesthesia Post Note  Patient: [name]  Procedure(s) Performed: LEAD EXTRACTION TRANSESOPHAGEAL ECHOCARDIOGRAM     Patient location during evaluation: PACU Anesthesia Type: General Level of consciousness: awake and alert Pain management: pain level controlled Vital Signs Assessment: post-procedure vital signs reviewed and stable Respiratory status: spontaneous breathing, nonlabored ventilation, respiratory function stable and patient connected to nasal cannula oxygen Cardiovascular status: blood pressure returned to baseline and stable Postop Assessment: no apparent nausea or vomiting Anesthetic complications: no   There were no known notable events for this encounter.  Last Vitals:    Last Pain:                 Collene Schlichter

## 2023-02-10 NOTE — TOC Transition Note (Signed)
Transition of Care [facility]) - Discharge Note   Patient Details  Name: [name] MRN: [id number] Date of Birth: [birth year]  Transition of Care [facility]) CM/SW Contact:  Ronny Bacon, RN Phone Number: 02/10/2023, 12:28 PM   Clinical Narrative:   Sherron Monday with Pam with [facility], following patient for home IV abx infusions. Patient will need her afternoon dose before discharged and will receive meds at home later this evening. Interdisciplinary team aware.           Patient Goals and CMS Choice            Discharge Placement                       Discharge Plan and Services Additional resources added to the After Visit Summary for                                       Social Drivers of Health (SDOH) Interventions SDOH Screenings   Food Insecurity: No Food Insecurity (02/01/2023)  Housing: Low Risk  (02/01/2023)  Transportation Needs: Unmet Transportation Needs (02/01/2023)  Utilities: Not At Risk (02/01/2023)  Alcohol Screen: Low Risk  (07/26/2022)  Depression (PHQ2-9): Low Risk  (09/21/2022)  Financial Resource Strain: Low Risk  (02/01/2023)  Physical Activity: Insufficiently Active (07/26/2022)  Social Connections: Moderately Integrated (07/26/2022)  Stress: No Stress Concern Present (07/26/2022)  Tobacco Use: Low Risk  (02/09/2023)  Health Literacy: Adequate Health Literacy (02/01/2023)     Readmission Risk Interventions     No data to display

## 2023-02-10 NOTE — Progress Notes (Addendum)
[facility]    Date of Admission:  02/09/2023   Total days of antibiotics 2   ID: [name] is a 73 y.o. female with  staph capitis cardiac device pocket infection Principal Problem:   ICD (implantable cardioverter-defibrillator) infection (HCC) Active Problems:   Infection associated with cardiac device (HCC)    Subjective: Poor sleep due to pain. She often gets nightly discomfort of her feet for which she uses icy-hot cream to help manage at home. She also has soreness to chest wall from procedure. Remains afebrile.  Medications:   sodium chloride   Intravenous Once   atorvastatin  80 mg Oral q1800   carvedilol  3.125 mg Oral BID WC   furosemide  40 mg Oral BID   pantoprazole  40 mg Oral Daily   potassium chloride SA  20 mEq Oral BID   sacubitril-valsartan  1 tablet Oral BID   spironolactone  25 mg Oral Daily    Objective: Vital signs in last 24 hours: Temp:  [97.3 F (36.3 C)-98.2 F (36.8 C)] 97.6 F (36.4 C) (01/25 0835) Pulse Rate:  [73-99] 94 (01/25 1119) Resp:  [12-25] 14 (01/25 0835) BP: (101-133)/(64-83) 117/64 (01/25 1119) SpO2:  [93 %-99 %] 97 % (01/25 0835) Arterial Line BP: (80-137)/(63-90) 80/75 (01/25 0355) Weight:  [77.7 kg-78 kg] 77.7 kg (01/24 1941) Physical Exam  Constitutional:  oriented to person, place, and time. appears well-developed and well-nourished. No distress.  HENT: [place]/AT, PERRLA, no scleral icterus Mouth/Throat: Oropharynx is clear and moist. No oropharyngeal exudate.  Cardiovascular: Normal rate, regular rhythm and normal heart sounds. Exam reveals no gallop and no friction rub.  No murmur heard.  Chest wall = bandage in place Pulmonary/Chest: Effort normal and breath sounds normal. No respiratory distress.  has no wheezes.  Neck = supple, no nuchal rigidity Lymphadenopathy: no cervical adenopathy. No axillary adenopathy Neurological: alert and oriented to person, place, and time.  Skin: Skin is  warm and dry. No rash noted. No erythema.  Psychiatric: a normal mood and affect.  behavior is normal.    Lab Results No results for input(s): "WBC", "HGB", "HCT", "NA", "K", "CL", "CO2", "BUN", "CREATININE", "GLU" in the last 72 hours.  Invalid input(s): "PLATELETS" Liver Panel No results for input(s): "PROT", "ALBUMIN", "AST", "ALT", "ALKPHOS", "BILITOT", "BILIDIR", "IBILI" in the last 72 hours. Sedimentation Rate No results for input(s): "ESRSEDRATE" in the last 72 hours. C-Reactive Protein No results for input(s): "CRP" in the last 72 hours.  Microbiology: Prior cx showed staph capitis Studies/Results: DG Chest 2 View Result Date: 02/10/2023 CLINICAL DATA:  ICD infection. EXAM: CHEST - 2 VIEW COMPARISON:  03/30/2022 FINDINGS: The cardio pericardial silhouette is enlarged. The lungs are clear without focal pneumonia, edema, pneumothorax or pleural effusion. Left-sided permanent pacemaker/AICD seen previously has been retrieved in the interval. Telemetry leads overlie the chest. IMPRESSION: Interval retrieval of left-sided permanent pacemaker/AICD. No acute cardiopulmonary findings. Electronically Signed   By: Kennith Center M.D.   On: 02/10/2023 10:25   EP PPM/ICD IMPLANT Result Date: 02/09/2023  CONCLUSIONS:  1. Chronic systolic heart failure with an CRT-D in situ  2. CRT-D pocket infection  3. Successful CRT-D system extraction  4. TEE showing small pericardial effusion at baseline. Unchanged findings after device extraction  5.  No early apparent complications.  6. Hold Plavix for 5 days (OK to restart 02/15/2023)  7. Infectious disease consult today. PICC line ordered. Patient to receive outpatient IV antibiotics per ID recommendations.  EP STUDY Result Date: 02/09/2023  CONCLUSIONS:  1. Chronic systolic heart failure with an CRT-D in situ  2. CRT-D pocket infection  3. Successful CRT-D system extraction  4. TEE showing small pericardial effusion at baseline. Unchanged findings after  device extraction  5.  No early apparent complications.  6. Hold Plavix for 5 days (OK to restart 02/15/2023)  7. Infectious disease consult today. PICC line ordered. Patient to receive outpatient IV antibiotics per ID recommendations.   [place] EKG SITE RITE Result Date: 02/09/2023 If [facility] image not attached, placement could not be confirmed due to current cardiac rhythm.    Assessment/Plan: Cardiac device pocket infection with staph capitis = plan for 4 wk of cefazolin 2gm IV Q 8hr. Currently on day 2 of iv abtx. Awaiting for picc line to be placed so that can arrange for discharge home and abtx administration and teaching  Long term monitoring = will check cbc bmp, sed rate and crp.  Foot pain = will do a trial of sarna cream to see if that helps her symptoms, in lieu of icy-hot which is menthol based. Will avoid sedating medication. Also getting acetominophen for discomfort  Patient required complex antimicrobial therapy counseling and treatment. (Modifier [id number])  I have personally spent 50 minutes involved in face-to-face and non-face-to-face activities for this patient on the day of the visit. Professional time spent includes the following activities: Preparing to see the patient (review of tests), Obtaining and/or reviewing separately obtained history (admission/discharge record), Performing a medically appropriate examination and/or evaluation , Ordering medications/tests/procedures, referring and communicating with other health care professionals, Documenting clinical information in the EMR, Independently interpreting results (not separately reported), Communicating results to the patient/family/caregiver, Counseling and educating the patient/family/caregiver and Care coordination (not separately reported).     [facility] Pager: [phone]  02/10/2023, 11:20 AM

## 2023-02-10 NOTE — Progress Notes (Signed)
Peripherally Inserted Central Catheter Placement  The IV Nurse has discussed with the patient and/or persons authorized to consent for the patient, the purpose of this procedure and the potential benefits and risks involved with this procedure.  The benefits include less needle sticks, lab draws from the catheter, and the patient may be discharged home with the catheter. Risks include, but not limited to, infection, bleeding, blood clot (thrombus formation), and puncture of an artery; nerve damage and irregular heartbeat and possibility to perform a PICC exchange if needed/ordered by physician.  Alternatives to this procedure were also discussed.  Bard Power PICC patient education guide, fact sheet on infection prevention and patient information card has been provided to patient /or left at bedside.    PICC Placement Documentation  PICC Single Lumen 02/10/23 Right Basilic 38 cm 0 cm (Active)  Indication for Insertion or Continuance of Line Home intravenous therapies (PICC only) 02/10/23 1203  Exposed Catheter (cm) 0 cm 02/10/23 1203  Site Assessment Clean, Dry, Intact 02/10/23 1203  Line Status Flushed;Saline locked;Blood return noted 02/10/23 1203  Dressing Type Securing device;Transparent 02/10/23 1203  Dressing Status Antimicrobial disc/dressing in place;Clean, Dry, Intact 02/10/23 1203  Line Care Connections checked and tightened 02/10/23 1203  Line Adjustment (NICU/IV Team Only) No 02/10/23 1203  Dressing Intervention New dressing;Adhesive placed at insertion site (IV team only);Adhesive placed around edges of dressing (IV team/ICU RN only) 02/10/23 1203  Dressing Change Due 02/17/23 02/10/23 1203       Elliot Dally 02/10/2023, 12:04 PM

## 2023-02-11 LAB — BPAM RBC
Blood Product Expiration Date: 202502042359
Blood Product Expiration Date: 202502062359
Blood Product Expiration Date: 202502192359
Blood Product Expiration Date: 202502242359
ISSUE DATE / TIME: 202501241329
ISSUE DATE / TIME: 202501241329
Unit Type and Rh: 1700
Unit Type and Rh: 1700
Unit Type and Rh: 7300
Unit Type and Rh: 7300

## 2023-02-11 LAB — TYPE AND SCREEN
ABO/RH(D): B NEG
Antibody Screen: NEGATIVE
Unit division: 0
Unit division: 0
Unit division: 0
Unit division: 0

## 2023-02-12 ENCOUNTER — Telehealth: Payer: Self-pay | Admitting: Internal Medicine

## 2023-02-12 ENCOUNTER — Encounter (HOSPITAL_COMMUNITY): Payer: Self-pay | Admitting: Cardiology

## 2023-02-12 ENCOUNTER — Other Ambulatory Visit (HOSPITAL_COMMUNITY): Payer: 59

## 2023-02-12 NOTE — Telephone Encounter (Signed)
I canceled all upcoming remotes. I put a note in paceart and took the device out of paceart. I took the patient out of [facility].

## 2023-02-12 NOTE — Telephone Encounter (Signed)
Patient is calling to have the remote home checks removed due to no longer having a device inserted. Please advise.

## 2023-02-13 MED FILL — Fentanyl Citrate Soln Prefilled Syringe 100 MCG/2ML: INTRAMUSCULAR | Qty: 2.5 | Status: AC

## 2023-02-14 LAB — AEROBIC/ANAEROBIC CULTURE W GRAM STAIN (SURGICAL/DEEP WOUND): Gram Stain: NONE SEEN

## 2023-02-20 ENCOUNTER — Telehealth: Payer: Self-pay

## 2023-02-20 NOTE — Telephone Encounter (Signed)
Patient left voicemail requesting call back to review results from home health. Will forward message to provider and pharmacy team. [name], RMA

## 2023-02-21 NOTE — Telephone Encounter (Signed)
 Spoke with pt regarding results. No questions at this time.  Juanita Laster, RMA

## 2023-02-21 NOTE — Telephone Encounter (Signed)
 I have not reviewed faxes yet, but I see her 02/18/23 lab results in [facility] link. WBC has trended down nicely from ~17 to ~12 now. ESR not performed d/t insufficient quantity. CRP is 60 - fairly similar to ~54 in the hospital - nothing to worry about. Will continue to monitor.

## 2023-02-23 ENCOUNTER — Encounter: Payer: Self-pay | Admitting: Internal Medicine

## 2023-02-23 ENCOUNTER — Ambulatory Visit: Payer: 59 | Attending: Internal Medicine | Admitting: Internal Medicine

## 2023-02-23 VITALS — BP 125/68 | HR 80 | Temp 98.0°F | Ht 64.0 in | Wt 176.0 lb

## 2023-02-23 DIAGNOSIS — T827XXA Infection and inflammatory reaction due to other cardiac and vascular devices, implants and grafts, initial encounter: Secondary | ICD-10-CM

## 2023-02-23 DIAGNOSIS — Z8673 Personal history of transient ischemic attack (TIA), and cerebral infarction without residual deficits: Secondary | ICD-10-CM

## 2023-02-23 DIAGNOSIS — D649 Anemia, unspecified: Secondary | ICD-10-CM | POA: Diagnosis not present

## 2023-02-23 DIAGNOSIS — I428 Other cardiomyopathies: Secondary | ICD-10-CM

## 2023-02-23 DIAGNOSIS — Z7984 Long term (current) use of oral hypoglycemic drugs: Secondary | ICD-10-CM

## 2023-02-23 DIAGNOSIS — I5022 Chronic systolic (congestive) heart failure: Secondary | ICD-10-CM

## 2023-02-23 DIAGNOSIS — Z5986 Financial insecurity: Secondary | ICD-10-CM

## 2023-02-23 MED ORDER — ATORVASTATIN CALCIUM 80 MG PO TABS: 80.0000 mg | ORAL_TABLET | Freq: Every day | ORAL | 3 refills | Status: DC

## 2023-02-23 MED ORDER — FLUTICASONE PROPIONATE 50 MCG/ACT NA SUSP: 2.0000 | Freq: Every day | NASAL | 1 refills | Status: DC

## 2023-02-23 MED ORDER — PANTOPRAZOLE SODIUM 40 MG PO TBEC: 40.0000 mg | DELAYED_RELEASE_TABLET | Freq: Every day | ORAL | 3 refills | Status: DC

## 2023-02-23 MED ORDER — ALBUTEROL SULFATE HFA 108 (90 BASE) MCG/ACT IN AERS: 2.0000 | INHALATION_SPRAY | Freq: Every evening | RESPIRATORY_TRACT | 3 refills | Status: AC | PRN

## 2023-02-23 NOTE — Patient Instructions (Signed)
 Please ask the visiting nurse who draws your blood to draw blood for iron level and vitamin B12 level.  I have entered the order in the system through [facility].

## 2023-02-23 NOTE — Progress Notes (Signed)
 Patient ID: [name], female    DOB: [birth year]  MRN: [id number]  CC: Congestive Heart Failure (Heart failure f/u. /No questions / concerns/Already received flu vax)   Subjective: [name] is a 73 y.o. female who presents for chronic ds management. Husband is with her. PCP was Dr. Brien who has since retired. 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  Culture grew staph capitis.  ICD/PM removed by Dr. Cindie.  Plan to replace after infection treated but husband says they may not because heart function has improved. Has PICC line in place, get Ancef  TID.  Followed by Dr. Luiz; f/u 03/09/2023. Has appt with cardiology 02/26/23 Last K+ level was 3.3.  On Furosemide  and K 20 meq 2 tabs daily Also has anemia with H/H going from 13/41 to 10.3/31.5 at discharge.  CHF/NICM:  Sees Dr. Rolan.  On Coreg  3.25 mg BID, Lasix  40 mg daily (instead of 80 mg daily per pt), Farxiga  10 mg, Spiro 25 mg daily and Entresto  24/26 BID (reports being told to decrease to 1x/day by cardiology NP on last visit 10/2022) -no LE/SOB/CP/PND  Hx of CVA: on Plavix  and Lipitor  HM:  due for MMG.  Wants to defer until wound on LT upper chest heals and has completed Abx.   Patient Active Problem List   Diagnosis Date Noted   Hypertension 02/10/2023   Infection associated with cardiac device (HCC) 02/09/2023   ICD (implantable cardioverter-defibrillator) infection (HCC) 02/09/2023   ICD (implantable cardioverter-defibrillator) in place - MDT 12/27/2021   Obesity (BMI 30-39.9) 11/22/2021   History of CVA with residual deficit 02/22/2021   Heart failure with reduced ejection fraction (HCC)- s/p ICD and pacemaker  10/19/2017   Allergic rhinitis 11/09/2014   Cerebral infarction  due to embolism of cerebral artery (HCC) 12/03/2013   GERD (gastroesophageal reflux disease)    Hyperlipidemia    Non-ischemic cardiomyopathy (HCC) 08/11/2013   Mitral regurgitation    Healthcare maintenance 04/09/2013     Current Outpatient Medications on File Prior to Visit  Medication Sig Dispense Refill   carvedilol  (COREG ) 3.125 MG tablet Take 1 tablet (3.125 mg total) by mouth 2 (two) times daily with a meal. 180 tablet 3   ceFAZolin  (ANCEF ) IVPB Inject 2 g into the vein every 8 (eight) hours for 28 days. Indication:  ICD infection  First Dose: Yes Last Day of Therapy:  03/09/23 Labs - Once weekly:  CBC/D and BMP, Labs - Once weekly: ESR and CRP Method of administration: IV Push Method of administration may be changed at the discretion of home infusion pharmacist based upon assessment of the patient and/or caregiver's ability to self-administer the medication ordered. 84 Units 0   cetirizine  (ZYRTEC ) 10 MG tablet Take 1 tablet (10 mg total) by mouth daily. 60 tablet 3   clopidogrel  (PLAVIX ) 75 MG tablet Take 1 tablet (75 mg total) by mouth daily. 90 tablet 3   FARXIGA  10 MG TABS tablet Take 1 tablet (10 mg total) by mouth daily before breakfast. 30 tablet 11   furosemide  (LASIX ) 40 MG tablet Take 2 tablets (80 mg total) by mouth daily. (Patient taking differently: Take 40 mg by mouth 2 (two) times daily.) 90 tablet 3   potassium chloride  SA (KLOR-CON  M) 20 MEQ tablet Take  2 tablets (40 mEq total) by mouth daily. (Patient taking differently: Take 20 mEq by mouth 2 (two) times daily.) 180 tablet 3   sacubitril -valsartan  (ENTRESTO ) 24-26 MG Take 1 tablet by mouth 2 (two) times daily. 60 tablet 6   spironolactone  (ALDACTONE ) 25 MG tablet Take 1 tablet (25 mg total) by mouth daily. 90 tablet 3   No current facility-administered medications on file prior to visit.    Allergies  Allergen Reactions   Voltaren  [Diclofenac ] Rash    Social History   Socioeconomic History   Marital  status: Married    Spouse name: Not on file   Number of children: 1   Years of education: 12   Highest education level: Not on file  Occupational History   Not on file  Tobacco Use   Smoking status: Never   Smokeless tobacco: Never  Vaping Use   Vaping status: Never Used  Substance and Sexual Activity   Alcohol use: No    Alcohol/week: 0.0 standard drinks of alcohol   Drug use: No   Sexual activity: Yes  Other Topics Concern   Not on file  Social History Narrative   Patient is married with one child.   Patient is right handed.   Patient has hs education and 3 yrs of college.   Patient drinks 1 can daily.   Social Drivers of Health   Financial Resource Strain: Medium Risk (02/23/2023)   Overall Financial Resource Strain (CARDIA)    Difficulty of Paying Living Expenses: Somewhat hard  Food Insecurity: No Food Insecurity (02/23/2023)   Hunger Vital Sign    Worried About Running Out of Food in the Last Year: Never true    Ran Out of Food in the Last Year: Never true  Transportation Needs: No Transportation Needs (02/23/2023)   PRAPARE - Administrator, Civil Service (Medical): No    Lack of Transportation (Non-Medical): No  Recent Concern: Transportation Needs - Unmet Transportation Needs (02/01/2023)   PRAPARE - Administrator, Civil Service (Medical): Yes    Lack of Transportation (Non-Medical): No  Physical Activity: Sufficiently Active (02/23/2023)   Exercise Vital Sign    Days of Exercise per Week: 5 days    Minutes of Exercise per Session: 30 min  Stress: No Stress Concern Present (02/23/2023)   [employer] of Occupational Health - Occupational Stress Questionnaire    Feeling of Stress : Not at all  Social Connections: Moderately Integrated (02/23/2023)   Social Connection and Isolation Panel [NHANES]    Frequency of Communication with Friends and Family: Once a week    Frequency of Social Gatherings with Friends and Family: Never    Attends  Religious Services: More than 4 times per year    Active Member of [employer] or Organizations: Yes    Attends Engineer, Structural: More than 4 times per year    Marital Status: Married  Catering Manager Violence: Not At Risk (02/23/2023)   Humiliation, Afraid, Rape, and Kick questionnaire    Fear of Current or Ex-Partner: No    Emotionally Abused: No    Physically Abused: No    Sexually Abused: No    Family History  Problem Relation Age of Onset   Congestive Heart Failure Mother    Cancer Mother 71       Breast   Cancer Father 48       Oat Cell CA   Lupus Sister    Heart disease Brother  Past Surgical History:  Procedure Laterality Date   BI-VENTRICULAR IMPLANTABLE CARDIOVERTER DEFIBRILLATOR N/A 11/05/2013   Procedure: BI-VENTRICULAR IMPLANTABLE CARDIOVERTER DEFIBRILLATOR  (CRT-D);  Surgeon: Elspeth JAYSON Sage, MD;  Location: [facility] CATH LAB;  Service: Cardiovascular;  Laterality: N/A;   BI-VENTRICULAR IMPLANTABLE CARDIOVERTER DEFIBRILLATOR  (CRT-D)  11/05/2013   BIV ICD GENERATOR CHANGEOUT N/A 01/18/2023   Procedure: BIV ICD GENERATOR CHANGEOUT;  Surgeon: Sage Elspeth JAYSON, MD;  Location: [facility] INVASIVE CV LAB;  Service: Cardiovascular;  Laterality: N/A;   CARDIAC CATHETERIZATION  07/2013   KNEE ARTHROSCOPY Right    LEAD EXTRACTION N/A 02/09/2023   Procedure: LEAD EXTRACTION;  Surgeon: Cindie Ole DASEN, MD;  Location: [facility] INVASIVE CV LAB;  Service: Cardiovascular;  Laterality: N/A;   LEFT AND RIGHT HEART CATHETERIZATION WITH CORONARY ANGIOGRAM N/A 08/08/2013   Procedure: LEFT AND RIGHT HEART CATHETERIZATION WITH CORONARY ANGIOGRAM;  Surgeon: Lonni JONETTA Cash, MD;  Location: [facility] CATH LAB;  Service: Cardiovascular;  Laterality: N/A;   RIGHT/LEFT HEART CATH AND CORONARY ANGIOGRAPHY N/A 06/05/2019   Procedure: RIGHT/LEFT HEART CATH AND CORONARY ANGIOGRAPHY;  Surgeon: Rolan Ezra RAMAN, MD;  Location: [facility] INVASIVE CV LAB;  Service: Cardiovascular;  Laterality: N/A;   TEE WITHOUT  CARDIOVERSION N/A 09/26/2013   Procedure: TRANSESOPHAGEAL ECHOCARDIOGRAM (TEE);  Surgeon: Ezra RAMAN Rolan, MD;  Location: [facility] ENDOSCOPY;  Service: Cardiovascular;  Laterality: N/A;   TONSILLECTOMY     TRANSESOPHAGEAL ECHOCARDIOGRAM (CATH LAB) N/A 02/09/2023   Procedure: TRANSESOPHAGEAL ECHOCARDIOGRAM;  Surgeon: Cindie Ole DASEN, MD;  Location: [facility] INVASIVE CV LAB;  Service: Cardiovascular;  Laterality: N/A;   WRIST ARTHROSCOPY  12/21/2010   Procedure: ARTHROSCOPY WRIST;  Surgeon: Arley JONELLE Curia, MD;  Location: [facility];  Service: Orthopedics;  Laterality: Right;  right wrist, repair TFCC on right, open reconstruction ECU sheath    ROS: Review of Systems Negative except as stated above  PHYSICAL EXAM: BP 125/68 (BP Location: Left Arm, Patient Position: Sitting, Cuff Size: Large)   Pulse 80   Temp 98 F (36.7 C) (Oral)   Ht 5' 4 (1.626 m)   Wt 176 lb (79.8 kg)   SpO2 99%   BMI 30.21 kg/m   Wt Readings from Last 3 Encounters:  02/23/23 176 lb (79.8 kg)  02/09/23 171 lb 4.8 oz (77.7 kg)  02/01/23 174 lb (78.9 kg)    Physical Exam  General appearance - alert, well appearing, elderly caucasian female and in no distress Neck - supple, no significant adenopathy Chest - clear to auscultation, no wheezes, rales or rhonchi, symmetric air entry Heart - normal rate, regular rhythm, normal S1, S2, no murmurs, rubs, clicks or gallops Musculoskeletal - has cane; needed assistance from spouse to get on and off exam table Extremities - no LE edema Skin:  surgical incision LT upper anterior chest - several sutures in place.  No signs of wound dehiscence     Latest Ref Rng & Units 02/10/2023   12:28 PM 01/25/2023    1:35 AM 01/08/2023   10:37 AM  CMP  Glucose 70 - 99 mg/dL 891  98  94   BUN 8 - 23 mg/dL 15  10  18    Creatinine 0.44 - 1.00 mg/dL 8.93  9.01  9.03   Sodium 135 - 145 mmol/L 133  138  141   Potassium 3.5 - 5.1 mmol/L 3.3  3.7  3.9   Chloride 98 - 111 mmol/L 103   101  106   CO2 22 - 32 mmol/L 21  22  17   Calcium  8.9 - 10.3 mg/dL 8.7  9.7  9.1    Lipid Panel     Component Value Date/Time   CHOL 123 03/22/2022 0927   TRIG 128 03/22/2022 0927   HDL 31 (L) 03/22/2022 0927   CHOLHDL 4.0 03/22/2022 0927   VLDL 26 03/22/2022 0927   LDLCALC 66 03/22/2022 0927    CBC    Component Value Date/Time   WBC 17.8 (H) 02/10/2023 1228   RBC 3.64 (L) 02/10/2023 1228   HGB 10.3 (L) 02/10/2023 1228   HGB 12.5 01/08/2023 1029   HCT 31.5 (L) 02/10/2023 1228   HCT 39.1 01/08/2023 1029   PLT 237 02/10/2023 1228   PLT 297 01/08/2023 1029   MCV 86.5 02/10/2023 1228   MCV 87 01/08/2023 1029   MCH 28.3 02/10/2023 1228   MCHC 32.7 02/10/2023 1228   RDW 15.1 02/10/2023 1228   RDW 14.1 01/08/2023 1029   LYMPHSABS 2.7 02/22/2021 0937   MONOABS 0.5 11/03/2013 1006   EOSABS 432 01/25/2023 0135   EOSABS 0.4 02/22/2021 0937   BASOSABS 65 01/25/2023 0135   BASOSABS 0.1 02/22/2021 0937    ASSESSMENT AND PLAN: 1. Non-ischemic cardiomyopathy (HCC) (Primary) 2. Chronic systolic congestive heart failure (HCC) Stable.  Continue GDMT as ordered by cardiology including carvedilol  3.125 mg twice a day, Farxiga  10 mg daily, furosemide  40 mg daily with potassium supplement, Entresto  24/26 mg (supposed to be twice a day but patient states she was told to take it once a day by cardiology NP) and spironolactone  25 mg daily  3. Infection associated with cardiac device, initial encounter [facility]) Plan for treatment for 6 wks.  Followed by ID  4. Normocytic anemia I wanted to draw blood today but pt tells me she a nurse coming out this afternoon who draws blood for Dr. Luiz and sends to [facility].  I requested they have her draw iron studies and Vit B 12 levels and send to me.  Order enter in system  - Iron, TIBC and Ferritin Panel - Vitamin B12  5. History of cardioembolic cerebrovascular accident (CVA) Continue Plavix  and Lipitor - atorvastatin  (LIPITOR) 80 MG tablet; Take 1  tablet (80 mg total) by mouth daily at 6 PM.  Dispense: 90 tablet; Refill: 3  HM: We will discuss and order mammogram on next visit.  Patient was given the opportunity to ask questions.  Patient verbalized understanding of the plan and was able to repeat key elements of the plan.   This documentation was completed using Paediatric nurse.  Any transcriptional errors are unintentional.  Orders Placed This Encounter  Procedures   Iron, TIBC and Ferritin Panel   Vitamin B12     Requested Prescriptions   Signed Prescriptions Disp Refills   albuterol  (VENTOLIN  HFA) 108 (90 Base) MCG/ACT inhaler 18 g 3    Sig: Inhale 2 puffs into the lungs at bedtime as needed for wheezing or shortness of breath.   atorvastatin  (LIPITOR) 80 MG tablet 90 tablet 3    Sig: Take 1 tablet (80 mg total) by mouth daily at 6 PM.   fluticasone  (FLONASE ) 50 MCG/ACT nasal spray 48 g 1    Sig: Place 2 sprays into both nostrils daily.   pantoprazole  (PROTONIX ) 40 MG tablet 60 tablet 3    Sig: Take 1 tablet (40 mg total) by mouth daily.    Return in about 4 months (around 06/23/2023).  Danielle Louder, MD, FACP

## 2023-02-25 NOTE — Progress Notes (Signed)
 Wound / Device Check Visit   Patient Profile:  73 y/o F with PMH of NICM s/p CRT-D, HFrEF, intermittent LBBB, embolic CVA, GERD, thyroid  nodule (previously declined biopsy) who underwent generator change on 01/18/23 and at the time complained of pocket pain.  For this reason, the pocket was cultured and was positive for staph capitis.  She was evaluated by ID and system removal/extraction was recommended. On 02/09/23 she underwent CRT-D removal in the setting of pocket infection.   Device Hx:  MDT CRT-D 11/06/2023 Generator replacement on 01/18/23 CRT-D pocket infection s/p extraction 02/09/23   Exam:  Awake / alert, NAD Device site with 5 interrupted sutures in place, sutures clipped, wound well healed, no drainage or erythema, no edema, hematoma PICC line site with mild erythema at insertion site, no streaking or warmth   -ongoing wound care reviewed with patient and husband -pt remains on Ancef  per ID via PICC line through 03/09/23 -follow up with ID, EP APP in 3 months post extraction     Creighton Doffing, NP-C, AGACNP-BC [facility] - Electrophysiology  02/25/2023, 12:54 PM

## 2023-02-26 ENCOUNTER — Encounter: Payer: Self-pay | Admitting: Pulmonary Disease

## 2023-02-26 ENCOUNTER — Ambulatory Visit: Payer: 59 | Attending: Pulmonary Disease | Admitting: Pulmonary Disease

## 2023-02-26 VITALS — BP 106/68 | HR 74 | Ht 64.5 in | Wt 175.2 lb

## 2023-02-26 DIAGNOSIS — I5022 Chronic systolic (congestive) heart failure: Secondary | ICD-10-CM | POA: Diagnosis not present

## 2023-02-26 DIAGNOSIS — I428 Other cardiomyopathies: Secondary | ICD-10-CM

## 2023-02-26 DIAGNOSIS — T827XXD Infection and inflammatory reaction due to other cardiac and vascular devices, implants and grafts, subsequent encounter: Secondary | ICD-10-CM

## 2023-02-26 DIAGNOSIS — T827XXA Infection and inflammatory reaction due to other cardiac and vascular devices, implants and grafts, initial encounter: Secondary | ICD-10-CM

## 2023-02-26 NOTE — Patient Instructions (Signed)
 Medication Instructions:  Your physician recommends that you continue on your current medications as directed. Please refer to the Current Medication list given to you today.  *If you need a refill on your cardiac medications before your next appointment, please call your pharmacy*  Lab Work: None ordered If you have labs (blood work) drawn today and your tests are completely normal, you will receive your results only by: MyChart Message (if you have MyChart) OR A paper copy in the mail If you have any lab test that is abnormal or we need to change your treatment, we will call you to review the results.  Follow-Up: At [facility], you and your health needs are our priority.  As part of our continuing mission to provide you with exceptional heart care, we have created designated Provider Care Teams.  These Care Teams include your primary Cardiologist (physician) and Advanced Practice Providers (APPs -  Physician Assistants and Nurse Practitioners) who all work together to provide you with the care you need, when you need it.  Your next appointment:   As scheduled

## 2023-03-02 ENCOUNTER — Telehealth: Payer: Self-pay

## 2023-03-02 NOTE — Telephone Encounter (Signed)
Per Dr.Snider - pull PICC line today 03/02/2023 - patient should be done with IV abx.   Message sent to [facility].    Kalee Broxton Lesli Albee, CMA

## 2023-03-06 ENCOUNTER — Ambulatory Visit: Payer: 59 | Attending: Internal Medicine

## 2023-03-06 VITALS — Ht 64.0 in | Wt 171.0 lb

## 2023-03-06 DIAGNOSIS — Z0000 Encounter for general adult medical examination without abnormal findings: Secondary | ICD-10-CM | POA: Diagnosis not present

## 2023-03-06 NOTE — Progress Notes (Signed)
 Subjective:   [name] is a 73 y.o. female who presents for Medicare Annual (Subsequent) preventive examination.  Visit Complete: Virtual I connected with  [name] on 03/06/23 by a audio enabled telemedicine application and verified that I am speaking with the correct person using two identifiers.  Patient Location: Home  Provider Location: Home Office  I discussed the limitations of evaluation and management by telemedicine. The patient expressed understanding and agreed to proceed.  Vital Signs: Because this visit was a virtual/telehealth visit, some criteria may be missing or patient reported. Any vitals not documented were not able to be obtained and vitals that have been documented are patient reported.  This patient declined Interactive audio and Acupuncturist. Therefore the visit was completed with audio only.  Cardiac Risk Factors include: advanced age (>4men, >34 women)     Objective:    Today's Vitals   03/06/23 0841  Weight: 171 lb (77.6 kg)  Height: 5\' 4"  (1.626 m)  PainSc: 0-No pain   Body mass index is 29.35 kg/m.     03/06/2023    8:43 AM 01/18/2023    6:30 AM 07/26/2022   12:17 PM 11/21/2021    3:45 PM 06/05/2019   10:53 AM 07/12/2018    8:50 AM 10/19/2017    8:42 AM  Advanced Directives  Does Patient Have a Medical Advance Directive? No Yes No No  No No  Type of Advance Directive  Living will       Does patient want to make changes to medical advance directive?  No - Guardian declined   Yes (MAU/Ambulatory/Procedural Areas - Information given)    Would patient like information on creating a medical advance directive? No - Patient declined  Yes (MAU/Ambulatory/Procedural Areas - Information given) No - Patient declined  No - Patient declined No - Patient declined    Current Medications (verified) Outpatient Encounter Medications as of 03/06/2023  Medication Sig   albuterol (VENTOLIN HFA) 108 (90 Base) MCG/ACT inhaler Inhale 2 puffs  into the lungs at bedtime as needed for wheezing or shortness of breath.   atorvastatin (LIPITOR) 80 MG tablet Take 1 tablet (80 mg total) by mouth daily at 6 PM.   carvedilol (COREG) 3.125 MG tablet Take 1 tablet (3.125 mg total) by mouth 2 (two) times daily with a meal. (Patient taking differently: Take 3.125 mg by mouth daily.)   ceFAZolin (ANCEF) IVPB Inject 2 g into the vein every 8 (eight) hours for 28 days. Indication:  ICD infection  First Dose: Yes Last Day of Therapy:  03/09/23 Labs - Once weekly:  CBC/D and BMP, Labs - Once weekly: ESR and CRP Method of administration: IV Push Method of administration may be changed at the discretion of home infusion pharmacist based upon assessment of the patient and/or caregiver's ability to self-administer the medication ordered.   cetirizine (ZYRTEC) 10 MG tablet Take 1 tablet (10 mg total) by mouth daily.   clopidogrel (PLAVIX) 75 MG tablet Take 1 tablet (75 mg total) by mouth daily.   FARXIGA 10 MG TABS tablet Take 1 tablet (10 mg total) by mouth daily before breakfast.   fluticasone (FLONASE) 50 MCG/ACT nasal spray Place 2 sprays into both nostrils daily.   furosemide (LASIX) 40 MG tablet Take 2 tablets (80 mg total) by mouth daily. (Patient taking differently: Take 40 mg by mouth daily.)   pantoprazole (PROTONIX) 40 MG tablet Take 1 tablet (40 mg total) by mouth daily.   potassium chloride SA (  KLOR-CON M) 20 MEQ tablet Take 2 tablets (40 mEq total) by mouth daily. (Patient taking differently: Take 20 mEq by mouth 2 (two) times daily.)   sacubitril-valsartan (ENTRESTO) 24-26 MG Take 1 tablet by mouth 2 (two) times daily. (Patient taking differently: Take 1 tablet by mouth daily.)   spironolactone (ALDACTONE) 25 MG tablet Take 1 tablet (25 mg total) by mouth daily.   No facility-administered encounter medications on file as of 03/06/2023.    Allergies (verified) Voltaren [diclofenac]   History: Past Medical History:  Diagnosis Date   A-fib  (HCC)    Acute kidney injury superimposed on chronic kidney disease (HCC) 11/21/2021   Arthritis    Automatic implantable cardioverter-defibrillator in situ    Cardiac resynchronization therapy defibrillator (CRT-D) in place    a. placed on 11/05/13.   GERD (gastroesophageal reflux disease)    History of hiatal hernia    HLD (hyperlipidemia)    Hyperkalemia 11/21/2021   Hypotension    LBBB (left bundle branch block)    Mitral regurgitation    a. Mild-mod MR by echo 07/2013.   PONV (postoperative nausea and vomiting)    Stage 3a chronic kidney disease (HCC) 02/22/2021   Stroke (HCC) 09/23/2013   Systolic CHF (HCC)    a. Dx 07/2013 - due to NICM. Cath normal cors. EF 25%.   Past Surgical History:  Procedure Laterality Date   BI-VENTRICULAR IMPLANTABLE CARDIOVERTER DEFIBRILLATOR N/A 11/05/2013   Procedure: BI-VENTRICULAR IMPLANTABLE CARDIOVERTER DEFIBRILLATOR  (CRT-D);  Surgeon: Duke Salvia, MD;  Location: [facility] CATH LAB;  Service: Cardiovascular;  Laterality: N/A;   BI-VENTRICULAR IMPLANTABLE CARDIOVERTER DEFIBRILLATOR  (CRT-D)  11/05/2013   BIV ICD GENERATOR CHANGEOUT N/A 01/18/2023   Procedure: BIV ICD GENERATOR CHANGEOUT;  Surgeon: Duke Salvia, MD;  Location: [facility] INVASIVE CV LAB;  Service: Cardiovascular;  Laterality: N/A;   CARDIAC CATHETERIZATION  07/2013   KNEE ARTHROSCOPY Right    LEAD EXTRACTION N/A 02/09/2023   Procedure: LEAD EXTRACTION;  Surgeon: Lanier Prude, MD;  Location: [facility] INVASIVE CV LAB;  Service: Cardiovascular;  Laterality: N/A;   LEFT AND RIGHT HEART CATHETERIZATION WITH CORONARY ANGIOGRAM N/A 08/08/2013   Procedure: LEFT AND RIGHT HEART CATHETERIZATION WITH CORONARY ANGIOGRAM;  Surgeon: [name], MD;  Location: [facility] CATH LAB;  Service: Cardiovascular;  Laterality: N/A;   RIGHT/LEFT HEART CATH AND CORONARY ANGIOGRAPHY N/A 06/05/2019   Procedure: RIGHT/LEFT HEART CATH AND CORONARY ANGIOGRAPHY;  Surgeon: [name], MD;  Location: [facility] INVASIVE CV  LAB;  Service: Cardiovascular;  Laterality: N/A;   TEE WITHOUT CARDIOVERSION N/A 09/26/2013   Procedure: TRANSESOPHAGEAL ECHOCARDIOGRAM (TEE);  Surgeon: [name], MD;  Location: [facility] ENDOSCOPY;  Service: Cardiovascular;  Laterality: N/A;   TONSILLECTOMY     TRANSESOPHAGEAL ECHOCARDIOGRAM (CATH LAB) N/A 02/09/2023   Procedure: TRANSESOPHAGEAL ECHOCARDIOGRAM;  Surgeon: Lanier Prude, MD;  Location: [facility] INVASIVE CV LAB;  Service: Cardiovascular;  Laterality: N/A;   WRIST ARTHROSCOPY  12/21/2010   Procedure: ARTHROSCOPY WRIST;  Surgeon: [name], MD;  Location: [facility];  Service: Orthopedics;  Laterality: Right;  right wrist, repair TFCC on right, open reconstruction ECU sheath   Family History  Problem Relation Age of Onset   Congestive Heart Failure Mother    Cancer Mother 37       Breast   Cancer Father 40       Oat Cell CA   Lupus Sister    Heart disease Brother    Social History   Socioeconomic  History   Marital status: Married    Spouse name: Not on file   Number of children: 1   Years of education: 12   Highest education level: Not on file  Occupational History   Not on file  Tobacco Use   Smoking status: Never   Smokeless tobacco: Never  Vaping Use   Vaping status: Never Used  Substance and Sexual Activity   Alcohol use: No    Alcohol/week: 0.0 standard drinks of alcohol   Drug use: No   Sexual activity: Yes  Other Topics Concern   Not on file  Social History Narrative   Patient is married with one child.   Patient is right handed.   Patient has hs education and 3 yrs of college.   Patient drinks 1 can daily.   Social Drivers of Health   Financial Resource Strain: Medium Risk (03/06/2023)   Overall Financial Resource Strain (CARDIA)    Difficulty of Paying Living Expenses: Somewhat hard  Food Insecurity: No Food Insecurity (03/06/2023)   Hunger Vital Sign    Worried About Running Out of Food in the Last Year: Never true    Ran Out  of Food in the Last Year: Never true  Transportation Needs: No Transportation Needs (03/06/2023)   PRAPARE - Administrator, Civil Service (Medical): No    Lack of Transportation (Non-Medical): No  Recent Concern: Transportation Needs - Unmet Transportation Needs (02/01/2023)   PRAPARE - Administrator, Civil Service (Medical): Yes    Lack of Transportation (Non-Medical): No  Physical Activity: Sufficiently Active (03/06/2023)   Exercise Vital Sign    Days of Exercise per Week: 5 days    Minutes of Exercise per Session: 30 min  Stress: No Stress Concern Present (03/06/2023)   [employer] of Occupational Health - Occupational Stress Questionnaire    Feeling of Stress : Not at all  Social Connections: Moderately Integrated (03/06/2023)   Social Connection and Isolation Panel [NHANES]    Frequency of Communication with Friends and Family: Once a week    Frequency of Social Gatherings with Friends and Family: Never    Attends Religious Services: More than 4 times per year    Active Member of [employer] or Organizations: Yes    Attends Engineer, structural: More than 4 times per year    Marital Status: Married    Tobacco Counseling Counseling given: Not Answered   Clinical Intake:  Pre-visit preparation completed: Yes  Pain : No/denies pain Pain Score: 0-No pain     BMI - recorded: 29.35 Nutritional Status: BMI 25 -29 Overweight Nutritional Risks: None Diabetes: No  How often do you need to have someone help you when you read instructions, pamphlets, or other written materials from your doctor or pharmacy?: 1 - Never What is the last grade level you completed in school?: HSG; 3 years of college  Interpreter Needed?: No  Information entered by :: Lindamarie Maclachlan N. Darrin Koman, LPN.   Activities of Daily Living    03/06/2023    8:45 AM 02/09/2023   12:04 PM  In your present state of health, do you have any difficulty performing the following  activities:  Hearing? 0   Vision? 0   Difficulty concentrating or making decisions? 0   Walking or climbing stairs? 0   Dressing or bathing? 0   Doing errands, shopping? 0 0  Preparing Food and eating ? N   Using the Toilet? N   In the  past six months, have you accidently leaked urine? N   Do you have problems with loss of bowel control? N   Managing your Medications? N   Managing your Finances? N   Housekeeping or managing your Housekeeping? N     Patient Care Team: Marcine Matar, MD as PCP - General (Internal Medicine) [name], MD as PCP - Advanced Heart Failure (Cardiology) Duke Salvia, MD as PCP - Electrophysiology (Cardiology) [facility] as Consulting Physician (Optometry)  Indicate any recent Medical Services you may have received from other than [facility] providers in the past year (date may be approximate).     Assessment:   This is a routine wellness examination for Ethan.  Hearing/Vision screen Hearing Screening - Comments:: Denies hearing difficulties.   Vision Screening - Comments:: Wears rx glasses - up to date with routine eye exams with [facility]    Goals Addressed             This Visit's Progress    To stay healthy and out of the hospital.        Depression Screen    02/23/2023    9:01 AM 09/21/2022    9:12 AM 07/26/2022   12:21 PM 03/28/2022   10:15 AM 02/22/2022   10:35 AM 10/19/2021   11:09 AM 05/24/2021   10:58 AM  PHQ 2/9 Scores  PHQ - 2 Score 0 0 0 0 0 0 0  PHQ- 9 Score 0   0 1  0    Fall Risk    03/06/2023    8:44 AM 02/23/2023    9:01 AM 01/25/2023   12:55 PM 07/26/2022   12:16 PM 10/19/2021   11:09 AM  Fall Risk   Falls in the past year? 0 0 0 0 0  Number falls in past yr: 0 0  0 0  Injury with Fall? 0 0  0 0  Risk for fall due to : No Fall Risks No Fall Risks Impaired balance/gait No Fall Risks No Fall Risks  Follow up Falls prevention discussed;Falls evaluation completed Falls evaluation  completed Falls evaluation completed Falls prevention discussed;Education provided;Falls evaluation completed Falls evaluation completed    MEDICARE RISK AT HOME: Medicare Risk at Home Any stairs in or around the home?: No If so, are there any without handrails?: No Home free of loose throw rugs in walkways, pet beds, electrical cords, etc?: Yes Adequate lighting in your home to reduce risk of falls?: Yes Life alert?: No Use of a cane, walker or w/c?: No Grab bars in the bathroom?: Yes Shower chair or bench in shower?: Yes Elevated toilet seat or a handicapped toilet?: Yes  TIMED UP AND GO:  Was the test performed?  No    Cognitive Function:    03/06/2023    8:45 AM 10/19/2021   11:16 AM  MMSE - Mini Mental State Exam  Not completed: Unable to complete   Orientation to time  5  Orientation to Place  5  Registration  3  Attention/ Calculation  5  Recall  3  Language- name 2 objects  2  Language- repeat  1  Language- follow 3 step command  3  Language- read & follow direction  1  Write a sentence  1  Copy design  1  Total score  30        03/06/2023    8:44 AM 07/26/2022   12:17 PM  6CIT Screen  What Year?  0 points 0 points  What month? 0 points 0 points  What time? 0 points 0 points  Count back from 20 0 points 0 points  Months in reverse 0 points 0 points  Repeat phrase 0 points 2 points  Total Score 0 points 2 points    Immunizations Immunization History  Administered Date(s) Administered   Fluad Quad(high Dose 65+) 10/19/2021   Fluad Trivalent(High Dose 65+) 09/21/2022   Influenza, High Dose Seasonal PF 09/15/2016   Influenza,inj,Quad PF,6+ Mos 04/09/2013, 11/06/2013, 09/24/2014, 10/19/2017, 09/16/2018   Influenza-Unspecified 11/17/2015, 10/15/2020   Moderna Covid-19 Fall Seasonal Vaccine 67yrs & older 03/20/2019, 04/17/2019   Moderna Sars-Covid-2 Vaccination 09/06/2019, 04/14/2020   PFIZER(Purple Top)SARS-COV-2 Vaccination 09/30/2020, 10/12/2021    PNEUMOCOCCAL CONJUGATE-20 10/19/2021   Pneumococcal Conjugate-13 08/09/2016, 09/28/2017   Pneumococcal Polysaccharide-23 09/25/2013   Tdap 07/09/2017   Zoster Recombinant(Shingrix) 09/15/2016, 02/11/2018    TDAP status: Up to date  Flu Vaccine status: Up to date  Pneumococcal vaccine status: Up to date  Covid-19 vaccine status: Completed vaccines  Qualifies for Shingles Vaccine? Yes   Zostavax completed No   Shingrix Completed?: Yes  Screening Tests Health Maintenance  Topic Date Due   COVID-19 Vaccine (7 - 2024-25 season) 03/11/2023 (Originally 09/17/2022)   MAMMOGRAM  06/15/2023 (Originally 09/16/2022)   Medicare Annual Wellness (AWV)  03/05/2024   Colonoscopy  11/08/2024   DTaP/Tdap/Td (2 - Td or Tdap) 07/10/2027   Pneumonia Vaccine 4+ Years old  Completed   INFLUENZA VACCINE  Completed   DEXA SCAN  Completed   Hepatitis C Screening  Completed   Zoster Vaccines- Shingrix  Completed   HPV VACCINES  Aged Out    Health Maintenance  There are no preventive care reminders to display for this patient.  Colorectal cancer screening: Type of screening: Colonoscopy. Completed 11/09/2014. Repeat every 10 years  Mammogram status: Completed 09/15/2020. Repeat every year-postponed until 06/15/2023  Bone Density status: Completed 01/07/2010. Results reflect: Bone density results: OSTEOPENIA. Repeat every 2-3 years.-overdue  Lung Cancer Screening: (Low Dose CT Chest recommended if Age 28-80 years, 20 pack-year currently smoking OR have quit w/in 15years.) does not qualify.   Lung Cancer Screening Referral: no  Additional Screening:  Hepatitis C Screening: does qualify; Completed 02/11/2015  Vision Screening: Recommended annual ophthalmology exams for early detection of glaucoma and other disorders of the eye. Is the patient up to date with their annual eye exam?  Yes  Who is the provider or what is the name of the office in which the patient attends annual eye exams? [facility] If pt is not established with a provider, would they like to be referred to a provider to establish care? No .   Dental Screening: Recommended annual dental exams for proper oral hygiene   Community Resource Referral / Chronic Care Management: CRR required this visit?  No   CCM required this visit?  No     Plan:     I have personally reviewed and noted the following in the patient's chart:   Medical and social history Use of alcohol, tobacco or illicit drugs  Current medications and supplements including opioid prescriptions. Patient is not currently taking opioid prescriptions. Functional ability and status Nutritional status Physical activity Advanced directives List of other physicians Hospitalizations, surgeries, and ER visits in previous 12 months Vitals Screenings to include cognitive, depression, and falls Referrals and appointments  In addition, I have reviewed and discussed with patient certain preventive protocols, quality metrics, and best practice recommendations.  A written personalized care plan for preventive services as well as general preventive health recommendations were provided to patient.     Mickeal Needy, LPN   09/30/7827   After Visit Summary: (MyChart) Due to this being a telephonic visit, the after visit summary with patients personalized plan was offered to patient via MyChart   Nurse Notes: None at this time.

## 2023-03-06 NOTE — Patient Instructions (Addendum)
 [name] , Thank you for taking time to come for your Medicare Wellness Visit. I appreciate your ongoing commitment to your health goals. Please review the following plan we discussed and let me know if I can assist you in the future.   Referrals/Orders/Follow-Ups/Clinician Recommendations: Yes; Keep maintaining your health by keeping your appointments with Dr. Laural Benes and any specialists that you may see.  Call us if you need anything.  Have a great year!!!!  This is a list of the screening recommended for you and due dates:  Health Maintenance  Topic Date Due   COVID-19 Vaccine (7 - 2024-25 season) 03/11/2023*   Mammogram  06/15/2023*   Medicare Annual Wellness Visit  03/05/2024   Colon Cancer Screening  11/08/2024   DTaP/Tdap/Td vaccine (2 - Td or Tdap) 07/10/2027   Pneumonia Vaccine  Completed   Flu Shot  Completed   DEXA scan (bone density measurement)  Completed   Hepatitis C Screening  Completed   Zoster (Shingles) Vaccine  Completed   HPV Vaccine  Aged Out  *Topic was postponed. The date shown is not the original due date.    Advanced directives: (Declined) Advance directive discussed with you today. Even though you declined this today, please call our office should you change your mind, and we can give you the proper paperwork for you to fill out.  Next Medicare Annual Wellness Visit scheduled for next year: Yes

## 2023-03-09 ENCOUNTER — Ambulatory Visit: Payer: 59 | Admitting: Internal Medicine

## 2023-03-13 ENCOUNTER — Encounter (HOSPITAL_COMMUNITY): Payer: 59

## 2023-04-09 ENCOUNTER — Telehealth: Payer: Self-pay | Admitting: Internal Medicine

## 2023-04-09 NOTE — Telephone Encounter (Signed)
 Just an FYI pt called stating she wanted to cancel her PHYS DEFIB CK appt due to her stating she no longer has the ICD. Please advise.

## 2023-04-14 ENCOUNTER — Other Ambulatory Visit: Payer: Self-pay | Admitting: Internal Medicine

## 2023-04-18 ENCOUNTER — Encounter: Payer: 59 | Admitting: Student

## 2023-04-18 ENCOUNTER — Encounter: Payer: 59 | Admitting: Physician Assistant

## 2023-05-14 ENCOUNTER — Encounter: Payer: 59 | Admitting: Pulmonary Disease

## 2023-05-15 ENCOUNTER — Other Ambulatory Visit: Payer: Self-pay | Admitting: Family Medicine

## 2023-05-18 ENCOUNTER — Encounter (HOSPITAL_COMMUNITY)

## 2023-05-31 ENCOUNTER — Other Ambulatory Visit (HOSPITAL_COMMUNITY): Payer: Self-pay | Admitting: Family Medicine

## 2023-06-01 ENCOUNTER — Encounter (HOSPITAL_COMMUNITY)

## 2023-06-05 ENCOUNTER — Encounter (HOSPITAL_COMMUNITY)

## 2023-06-25 ENCOUNTER — Ambulatory Visit: Payer: 59 | Admitting: Internal Medicine

## 2023-06-26 ENCOUNTER — Telehealth (HOSPITAL_COMMUNITY): Payer: Self-pay

## 2023-06-26 NOTE — Telephone Encounter (Signed)
 Called to confirm/remind patient of their appointment at the [facility] on 06/27/23.   Appointment:   [x] Confirmed  [] Left mess   [] No answer/No voice mail  [] VM Full/unable to leave message  [] Phone not in service  Patient reminded to bring all medications and/or complete list.  Confirmed patient has transportation. Gave directions, instructed to utilize valet parking.

## 2023-06-27 ENCOUNTER — Ambulatory Visit (HOSPITAL_COMMUNITY): Payer: Self-pay | Admitting: Family Medicine

## 2023-06-27 ENCOUNTER — Encounter (HOSPITAL_COMMUNITY): Payer: Self-pay

## 2023-06-27 ENCOUNTER — Ambulatory Visit (HOSPITAL_COMMUNITY)
Admission: RE | Admit: 2023-06-27 | Discharge: 2023-06-27 | Disposition: A | Discharge status: Home / Self Care | Source: Ambulatory Visit | Attending: Family Medicine | Admitting: Family Medicine

## 2023-06-27 VITALS — BP 110/62 | HR 77 | Ht 66.0 in | Wt 172.2 lb

## 2023-06-27 DIAGNOSIS — Z7902 Long term (current) use of antithrombotics/antiplatelets: Secondary | ICD-10-CM | POA: Diagnosis not present

## 2023-06-27 DIAGNOSIS — I5022 Chronic systolic (congestive) heart failure: Secondary | ICD-10-CM | POA: Diagnosis present

## 2023-06-27 DIAGNOSIS — Z95810 Presence of automatic (implantable) cardiac defibrillator: Secondary | ICD-10-CM | POA: Diagnosis not present

## 2023-06-27 DIAGNOSIS — I447 Left bundle-branch block, unspecified: Secondary | ICD-10-CM | POA: Insufficient documentation

## 2023-06-27 DIAGNOSIS — Z79899 Other long term (current) drug therapy: Secondary | ICD-10-CM | POA: Diagnosis not present

## 2023-06-27 DIAGNOSIS — I428 Other cardiomyopathies: Secondary | ICD-10-CM | POA: Diagnosis not present

## 2023-06-27 DIAGNOSIS — I340 Nonrheumatic mitral (valve) insufficiency: Secondary | ICD-10-CM | POA: Insufficient documentation

## 2023-06-27 DIAGNOSIS — E785 Hyperlipidemia, unspecified: Secondary | ICD-10-CM | POA: Insufficient documentation

## 2023-06-27 DIAGNOSIS — Z8673 Personal history of transient ischemic attack (TIA), and cerebral infarction without residual deficits: Secondary | ICD-10-CM | POA: Insufficient documentation

## 2023-06-27 LAB — BASIC METABOLIC PANEL WITH GFR
Anion gap: 9 (ref 5–15)
BUN: 16 mg/dL (ref 8–23)
CO2: 22 mmol/L (ref 22–32)
Calcium: 9.6 mg/dL (ref 8.9–10.3)
Chloride: 106 mmol/L (ref 98–111)
Creatinine, Ser: 1.04 mg/dL — ABNORMAL HIGH (ref 0.44–1.00)
GFR, Estimated: 56 mL/min — ABNORMAL LOW (ref >60)
Glucose, Bld: 88 mg/dL (ref 70–99)
Potassium: 3.9 mmol/L (ref 3.5–5.1)
Sodium: 137 mmol/L (ref 135–145)

## 2023-06-27 LAB — BRAIN NATRIURETIC PEPTIDE: B Natriuretic Peptide: 19.9 pg/mL (ref 0.0–100.0)

## 2023-06-27 LAB — LIPID PANEL
Cholesterol: 128 mg/dL (ref 0–200)
HDL: 37 mg/dL — ABNORMAL LOW (ref >40)
LDL Cholesterol: 69 mg/dL (ref 0–99)
Total CHOL/HDL Ratio: 3.5 ratio
Triglycerides: 110 mg/dL (ref <150)
VLDL: 22 mg/dL (ref 0–40)

## 2023-06-27 MED ORDER — METOPROLOL SUCCINATE ER 25 MG PO TB24: 12.5000 mg | ORAL_TABLET | Freq: Every day | ORAL | 1 refills | Status: AC

## 2023-06-27 NOTE — Patient Instructions (Addendum)
 Good to see you today!  STOP Coreg   START  Toprol 12.5mg  daily ( 1/2 tablet )   Continue Entresto  Twice daily  Your physician has requested that you have an echocardiogram. Echocardiography is a painless test that uses sound waves to create images of your heart. It provides your doctor with information about the size and shape of your heart and how well your heart's chambers and valves are working. This procedure takes approximately one hour. There are no restrictions for this procedure. Please do NOT wear cologne, perfume, aftershave, or lotions (deodorant is allowed). Please arrive 15 minutes prior to your appointment time.  Please note: We ask at that you not bring children with you during ultrasound (echo/ vascular) testing. Due to room size and safety concerns, children are not allowed in the ultrasound rooms during exams. Our front office staff cannot provide observation of children in our lobby area while testing is being conducted. An adult accompanying a patient to their appointment will only be allowed in the ultrasound room at the discretion of the ultrasound technician under special circumstances. We apologize for any inconvenience.  Labs done today, your results will be available in MyChart, we will contact you for abnormal readings.  Your physician recommends that you schedule a follow-up appointment as scheduled  If you have any questions or concerns before your next appointment please send us  a message through [place] or call our office at [phone].    TO LEAVE A MESSAGE FOR THE NURSE SELECT OPTION 2, PLEASE LEAVE A MESSAGE INCLUDING: YOUR NAME DATE OF BIRTH CALL BACK NUMBER REASON FOR CALL**this is important as we prioritize the call backs  YOU WILL RECEIVE A CALL BACK THE SAME DAY AS LONG AS YOU CALL BEFORE 4:00 PM At the [facility], you and your health needs are our priority. As part of our continuing mission to provide you with exceptional heart  care, we have created designated Provider Care Teams. These Care Teams include your primary Cardiologist (physician) and Advanced Practice Providers (APPs- Physician Assistants and Nurse Practitioners) who all work together to provide you with the care you need, when you need it.   You may see any of the following providers on your designated Care Team at your next follow up: Dr Jules Oar Dr Peder Bourdon Dr. Alwin Baars Dr. Arta Lark Amy Marijane Shoulders, NP Ruddy Corral, [place] [facility] [place], [place] Dennise Fitz, NP [place] Lee, NP Shawnee Dellen, NP Luster Salters, PharmD Bevely Brush, PharmD   Please be sure to bring in all your medications bottles to every appointment.    Thank you for choosing  [facility]

## 2023-06-27 NOTE — Progress Notes (Signed)
 Patient ID: [name], female   DOB: 1950-09-21, 73 y.o.   MRN: [id number]     PCP: Lawrance Presume, MD Neurologist: Dr. Janett Medin EP: Dr. Rodolfo Clan HF Cardiologist: Dr. Mitzie Anda   HPI: [name] is a 73 y.o. female with chronic systolic CHF with nonischemic cardiomypathy (no significant CAD) s/p Medtronic CRT-D, LBBB, mild-mod MR and CVA (09/2013).  Coronary angiography in 7/15 showed no significant CAD.  Admitted in 9/15 with right sided facial droop and expressive aphasia, CVA on MRI. Had TEE showing EF 15%.    QRS narrowed, so in 11/16 device was reprogrammed to VVI.   Echo 12/17: EF up to 45-50%.  Echo 3/19: EF 45-50% with moderate LVH and normal RV.   Echo 4/21 showed EF decreased to 30-35% with LAD territory WMA.  LHC/RHC in 5/21 showed normal filling pressures and cardiac output, no significant CAD.  With persistent LBBB, CRT was activated by Dr. Rodolfo Clan in 9/21.   Echo 3/23: EF 35-40%, diffuse HKN, mildly decreased RV systolic function.   Echo 3/24: EF 35-40%, global HKN, mildly reduced RV systolic function.  S/p gen change out then ultimately required ICD explant 2/2 to pocket infection with staph capitis. Required IV cefazolin  via PICC. TEE 1/25 showed small pericardial effusion.  Today she returns for HF follow up with her son and husband. Overall feeling fine. No SOB unless she pushes herself walking further distances on flat ground. Denies palpitations, abnormal bleeding, CP, dizziness, edema, or PND/Orthopnea. Appetite ok. Weight at home 168 pounds. Taking all medications, has been taking Coreg  once a day. Drinks pickle juice and pepsi.  Labs (1/24): K 3.7, creatinine 0.99 Labs (3/24): K 3.7, creatinine 0.95, LDL 66 Labs (8/24): K 3.9, creatinine 0.96 Labs (1/25): K 3.3, creatinine 1.06  ECG (personally reviewed): NSR, LBBB 74 bpm  PMH: 1. Nonischemic cardiomyopathy: Echo (7/15) with severe LV dilation, EF 25% with diffuse hypokinesis, mild to moderate MR.  RHC/ LHC  08/08/13 with RA 4, PA 23/8, PCWP 11, CI 2.52; no CAD.  TEE (9/15) with EF 15-20%, severe global hypokinesis, no LV thrombus, moderate MR, normal RV.  Echo (10/2013): EF 25-30%, grade I DD, mild AI. Medtronic CRT-D (10/15).  Echo (2/16) with EF 25-30%, diffuse hypokinesis worse in the septum, grade I diastolic dysfunction, normal RV size and systolic function, mild-moderate MR.  - Echo (8/16) with EF 35-40%, diffuse hypokinesis, mild-moderate mitral regurgitation.  - Echo (12/17): EF 45-50%, mild LVH, mild to moderate MR.  - Echo (3/19): EF 45-50% with moderate LVH, mild MR, normal RV size and systolic function.  - Echo (4/21): EF 30-35% with LAD territory wall motion abnormalities. - LHC/RHC (5/21): No significant CAD; mean RA 6, PA 31/13, mean PCWP 13, CI 3.37.  - Echo (3/23): EF 35-40%, diffuse hypokinesis, mildly decreased RV systolic function. - Echo (3/24): EF 35-40%, grade I DD, RV mildly reduced 2. CVA: 9/15 with dysarthria.  Carotids 9/15 with no significant CAD.  - Carotid dopplers (10/17): mild plaque, normal subclavians.  3. LBBB: Medtronic CRT-D (10/2013) - s/p device explant 1/25 2/2 pocket infection. 4. Hyperlipidemia 5. Cholelithiasis 6. Peripheral arterial dopplers (6/19): Normal  SH: lives with her son and husband. Does not drink or smoke. Currently not working.   FH: Mom had A fib and Heart Failure. Deceased 17        Sister has lupus  ROS: All systems negative except as listed in HPI, PMH and Problem List.  Current Outpatient Medications  Medication Sig Dispense Refill  albuterol  (VENTOLIN  HFA) 108 (90 Base) MCG/ACT inhaler Inhale 2 puffs into the lungs at bedtime as needed for wheezing or shortness of breath. 18 g 3   atorvastatin  (LIPITOR) 80 MG tablet Take 1 tablet (80 mg total) by mouth daily at 6 PM. 90 tablet 3   carvedilol  (COREG ) 3.125 MG tablet Take 1 tablet (3.125 mg total) by mouth 2 (two) times daily with a meal. (Patient taking differently: Take 3.125 mg  by mouth daily.) 180 tablet 3   cetirizine  (ZYRTEC ) 10 MG tablet Take 1 tablet (10 mg total) by mouth daily. 60 tablet 3   clopidogrel  (PLAVIX ) 75 MG tablet Take 1 tablet (75 mg total) by mouth daily. 90 tablet 3   ENTRESTO  24-26 MG Take 1 tablet by mouth 2 (two) times daily. 60 tablet 6   FARXIGA  10 MG TABS tablet Take 1 tablet (10 mg total) by mouth daily before breakfast. 30 tablet 11   fluticasone  (FLONASE ) 50 MCG/ACT nasal spray Place 2 sprays into both nostrils daily. 48 g 1   furosemide  (LASIX ) 40 MG tablet Take 2 tablets (80 mg total) by mouth daily. (Patient taking differently: Take 40 mg by mouth daily.) 90 tablet 3   pantoprazole  (PROTONIX ) 40 MG tablet Take 1 tablet (40 mg total) by mouth daily. 60 tablet 3   potassium chloride  SA (KLOR-CON  M) 20 MEQ tablet Take 2 tablets (40 mEq total) by mouth daily. (Patient taking differently: Take 20 mEq by mouth 2 (two) times daily.) 180 tablet 3   spironolactone  (ALDACTONE ) 25 MG tablet Take 1 tablet (25 mg total) by mouth daily. 90 tablet 3   No current facility-administered medications for this encounter.   BP 110/62   Pulse 77   Ht 5' 6 (1.676 m)   Wt 78.1 kg (172 lb 3.2 oz)   SpO2 98%   BMI 27.79 kg/m   Wt Readings from Last 3 Encounters:  06/27/23 78.1 kg (172 lb 3.2 oz)  03/06/23 77.6 kg (171 lb)  02/26/23 79.5 kg (175 lb 3.2 oz)   PHYSICAL EXAM: General:  NAD. No resp difficulty, walked into clinic with cane HEENT: Normal Neck: Supple. No JVD. Cor: Regular rate & rhythm. No rubs, gallops or murmurs. Lungs: Clear Abdomen: Soft, nontender, nondistended.  Extremities: No cyanosis, clubbing, rash, edema Neuro: Alert & oriented x 3, moves all 4 extremities w/o difficulty. Affect pleasant.  ASSESSMENT & PLAN: 1. Chronic Systolic Heart Failure: Nonischemic cardiomyopathy s/p Medtronic CRT-D in 10/15, coronary angiography without CAD in 07/2013. Echo done in 2/16, EF 25-30%.  Repeat echo in 8/16 with EF up to 35-40%.  Echo in  4/19 showed EF up to 45-50%.  However, echo in 4/21 showed EF back down to 30-35%.  LHC/RHC in 5/21 showed no CAD, normal filling pressures, preserved cardiac output.  Of note, Medtronic device had been reprogrammed to VVI as LBBB has been only intermittent => more recently, LBBB has been persistent and CRT was reactivated in 9/21. Echo 3/23 showed EF 35-40%, diffuse hypokinesis, mildly decreased RV systolic function.  Echo (3/24) showed EF 35-40%. NYHA class II-IIb  symptoms, she is not volume overloaded on exam. - Stop Coreg  - Start Toprol XL 12.5 mg daily to reduce pill burden - Continue Lasix  40 mg daily + 20 KCL bid  - Continue Entresto  24/26 bid.  - Continue spironolactone  25 mg daily.  - Continue dapagliflozin  10 mg daily.  No GU symptoms - Continue compression socks. - Update echo 2. Hx of CVA: ? Embolic.  No atrial fibrillation has been detected on device before explant.  She will continue Plavix  for now. She will need to be anticoagulated if she has documented AF.   3. Hyperlipidemia: Goal LDL < 70 with history of CVA.  - Continue atorvastatin  80 mg daily. Check lipids today.  Update echo. Follow up in 4 months with Dr. Mitzie Anda.  [facility] FNP-BC 06/27/2023

## 2023-07-10 ENCOUNTER — Ambulatory Visit (HOSPITAL_COMMUNITY): Admission: RE | Admit: 2023-07-10 | Source: Ambulatory Visit

## 2023-07-30 ENCOUNTER — Other Ambulatory Visit (HOSPITAL_COMMUNITY): Payer: Self-pay | Admitting: Family Medicine

## 2023-07-30 LAB — ECHO TEE
AV Mean grad: 3 mmHg
AV Peak grad: 5.7 mmHg
Ao pk vel: 1.19 m/s
Height: 64 in
Weight: 2752 [oz_av]

## 2023-08-15 ENCOUNTER — Ambulatory Visit (HOSPITAL_COMMUNITY)

## 2023-08-23 ENCOUNTER — Ambulatory Visit (HOSPITAL_COMMUNITY)

## 2023-09-03 ENCOUNTER — Other Ambulatory Visit: Payer: Self-pay | Admitting: Internal Medicine

## 2023-09-25 ENCOUNTER — Ambulatory Visit (HOSPITAL_COMMUNITY): Payer: Self-pay | Admitting: Cardiology

## 2023-09-25 ENCOUNTER — Encounter (HOSPITAL_COMMUNITY): Payer: Self-pay | Admitting: Cardiology

## 2023-09-25 ENCOUNTER — Ambulatory Visit (HOSPITAL_COMMUNITY)
Admission: RE | Admit: 2023-09-25 | Discharge: 2023-09-25 | Disposition: A | Discharge status: Home / Self Care | Source: Ambulatory Visit | Attending: Cardiology | Admitting: Cardiology

## 2023-09-25 VITALS — BP 120/80 | HR 75 | Wt 175.0 lb

## 2023-09-25 DIAGNOSIS — I5022 Chronic systolic (congestive) heart failure: Secondary | ICD-10-CM | POA: Insufficient documentation

## 2023-09-25 DIAGNOSIS — Z8673 Personal history of transient ischemic attack (TIA), and cerebral infarction without residual deficits: Secondary | ICD-10-CM | POA: Diagnosis not present

## 2023-09-25 DIAGNOSIS — Z7902 Long term (current) use of antithrombotics/antiplatelets: Secondary | ICD-10-CM | POA: Diagnosis not present

## 2023-09-25 DIAGNOSIS — I493 Ventricular premature depolarization: Secondary | ICD-10-CM | POA: Insufficient documentation

## 2023-09-25 DIAGNOSIS — Z79899 Other long term (current) drug therapy: Secondary | ICD-10-CM | POA: Diagnosis not present

## 2023-09-25 DIAGNOSIS — E785 Hyperlipidemia, unspecified: Secondary | ICD-10-CM | POA: Insufficient documentation

## 2023-09-25 DIAGNOSIS — I428 Other cardiomyopathies: Secondary | ICD-10-CM | POA: Insufficient documentation

## 2023-09-25 LAB — BASIC METABOLIC PANEL WITH GFR
Anion gap: 12 (ref 5–15)
BUN: 16 mg/dL (ref 8–23)
CO2: 23 mmol/L (ref 22–32)
Calcium: 9.3 mg/dL (ref 8.9–10.3)
Chloride: 102 mmol/L (ref 98–111)
Creatinine, Ser: 1.01 mg/dL — ABNORMAL HIGH (ref 0.44–1.00)
GFR, Estimated: 58 mL/min — ABNORMAL LOW (ref >60)
Glucose, Bld: 95 mg/dL (ref 70–99)
Potassium: 3.9 mmol/L (ref 3.5–5.1)
Sodium: 137 mmol/L (ref 135–145)

## 2023-09-25 LAB — BRAIN NATRIURETIC PEPTIDE: B Natriuretic Peptide: 17.6 pg/mL (ref 0.0–100.0)

## 2023-09-25 MED ORDER — FUROSEMIDE 40 MG PO TABS: 40.0000 mg | ORAL_TABLET | Freq: Every day | ORAL | Status: DC

## 2023-09-25 MED ORDER — SACUBITRIL-VALSARTAN 49-51 MG PO TABS: 1.0000 | ORAL_TABLET | Freq: Two times a day (BID) | ORAL | 11 refills | Status: AC

## 2023-09-25 NOTE — Progress Notes (Signed)
 Patient ID: [name], female   DOB: [birth year], 73 y.o.   MRN: [id number]     PCP: Vicci Barnie NOVAK, MD Neurologist: Dr. Rosemarie EP: Dr. Fernande HF Cardiologist: Dr. Rolan   Chief complaint: CHF  HPI: [name] is a 73 y.o. female with chronic systolic CHF with nonischemic cardiomypathy (no significant CAD) s/p Medtronic CRT-D, LBBB, mild-mod MR and CVA (09/2013).  Coronary angiography in 7/15 showed no significant CAD.  Admitted in 9/15 with right sided facial droop and expressive aphasia, CVA on MRI. Had TEE showing EF 15%.    QRS narrowed, so in 11/16 device was reprogrammed to VVI.   Echo 12/17: EF up to 45-50%.  Echo 3/19: EF 45-50% with moderate LVH and normal RV.   Echo 4/21 showed EF decreased to 30-35% with LAD territory WMA.  LHC/RHC in 5/21 showed normal filling pressures and cardiac output, no significant CAD.  With persistent LBBB, CRT was activated by Dr. Fernande in 9/21.   Echo 3/23: EF 35-40%, diffuse HKN, mildly decreased RV systolic function.   Echo 3/24: EF 35-40%, global HKN, mildly reduced RV systolic function.  S/p CRT-D generator change out in 1/25 then ultimately required ICD explant due to to pocket infection with Staph capitis. Required IV cefazolin  via PICC.   Today she returns for HF follow up with her son and husband. She is taking Lasix  40 mg daily.  She is symptomatically doing well.  No exertional dyspnea or chest pain.  No orthopnea/PND.  No lightheadedness. She is, however, not very active. Weight is up 3 lbs.    Labs (1/24): K 3.7, creatinine 0.99 Labs (3/24): K 3.7, creatinine 0.95, LDL 66 Labs (8/24): K 3.9, creatinine 0.96 Labs (1/25): K 3.3, creatinine 1.06 Labs (6/25): K 3.9, creatinine 1.04, LDL 69, BNP 20  ECG (personally reviewed): NSR, poor RWP, QRS 110 msec   PMH: 1. Nonischemic cardiomyopathy: Echo (7/15) with severe LV dilation, EF 25% with diffuse hypokinesis, mild to moderate MR.  RHC/ LHC 08/08/13 with RA 4, PA 23/8, PCWP 11,  CI 2.52; no CAD.  TEE (9/15) with EF 15-20%, severe global hypokinesis, no LV thrombus, moderate MR, normal RV.  Echo (10/2013): EF 25-30%, grade I DD, mild AI. Medtronic CRT-D (10/15).  Echo (2/16) with EF 25-30%, diffuse hypokinesis worse in the septum, grade I diastolic dysfunction, normal RV size and systolic function, mild-moderate MR.  - Echo (8/16) with EF 35-40%, diffuse hypokinesis, mild-moderate mitral regurgitation.  - Echo (12/17): EF 45-50%, mild LVH, mild to moderate MR.  - Echo (3/19): EF 45-50% with moderate LVH, mild MR, normal RV size and systolic function.  - Echo (4/21): EF 30-35% with LAD territory wall motion abnormalities. - LHC/RHC (5/21): No significant CAD; mean RA 6, PA 31/13, mean PCWP 13, CI 3.37.  - Echo (3/23): EF 35-40%, diffuse hypokinesis, mildly decreased RV systolic function. - Echo (3/24): EF 35-40%, grade I DD, RV mildly reduced 2. CVA: 9/15 with dysarthria.  Carotids 9/15 with no significant CAD.  - Carotid dopplers (10/17): mild plaque, normal subclavians.  3. LBBB: Medtronic CRT-D (10/2013) - s/p device explant 1/25 due to pocket infection. 4. Hyperlipidemia 5. Cholelithiasis 6. Peripheral arterial dopplers (6/19): Normal  SH: lives with her son and husband. Does not drink or smoke. Currently not working.   FH: Mom had A fib and Heart Failure. Deceased 53        Sister has lupus  ROS: All systems negative except as listed in HPI, PMH and Problem  List.  Current Outpatient Medications  Medication Sig Dispense Refill   albuterol  (VENTOLIN  HFA) 108 (90 Base) MCG/ACT inhaler Inhale 2 puffs into the lungs at bedtime as needed for wheezing or shortness of breath. 18 g 3   atorvastatin  (LIPITOR) 80 MG tablet Take 1 tablet (80 mg total) by mouth daily at 6 PM. 90 tablet 3   cetirizine  (ZYRTEC ) 10 MG tablet Take 1 tablet (10 mg total) by mouth daily. 60 tablet 3   clopidogrel  (PLAVIX ) 75 MG tablet Take 1 tablet (75 mg total) by mouth daily. 90 tablet 3    FARXIGA  10 MG TABS tablet TAKE ONE TABLET (10mg ) BY MOUTH EVERY DAY BEFORE breakfast 90 tablet 3   fluticasone  (FLONASE ) 50 MCG/ACT nasal spray Place 2 sprays into both nostrils daily. 48 g 1   metoprolol  succinate (TOPROL  XL) 25 MG 24 hr tablet Take 0.5 tablets (12.5 mg total) by mouth daily. 90 tablet 1   pantoprazole  (PROTONIX ) 40 MG tablet Take 1 tablet (40 mg total) by mouth daily. 60 tablet 3   potassium chloride  SA (KLOR-CON  M) 20 MEQ tablet Take 2 tablets (40 mEq total) by mouth daily. 180 tablet 3   sacubitril -valsartan  (ENTRESTO ) 49-51 MG Take 1 tablet by mouth 2 (two) times daily. 60 tablet 11   spironolactone  (ALDACTONE ) 25 MG tablet Take 1 tablet (25 mg total) by mouth daily. 90 tablet 3   furosemide  (LASIX ) 40 MG tablet Take 1 tablet (40 mg total) by mouth daily.     No current facility-administered medications for this encounter.   BP 120/80   Pulse 75   Wt 79.4 kg (175 lb)   SpO2 96%   BMI 28.25 kg/m   Wt Readings from Last 3 Encounters:  09/25/23 79.4 kg (175 lb)  06/27/23 78.1 kg (172 lb 3.2 oz)  03/06/23 77.6 kg (171 lb)   PHYSICAL EXAM: General:  NAD. No resp difficulty, walked into clinic with cane HEENT: Normal Neck: Supple. No JVD. Cor: Regular rate & rhythm. No rubs, gallops or murmurs. Lungs: Clear Abdomen: Soft, nontender, nondistended.  Extremities: No cyanosis, clubbing, rash, edema Neuro: Alert & oriented x 3, moves all 4 extremities w/o difficulty. Affect pleasant.  ASSESSMENT & PLAN: 1. Chronic Systolic Heart Failure: Nonischemic cardiomyopathy s/p Medtronic CRT-D in 10/15, coronary angiography without CAD in 07/2013. Echo done in 2/16, EF 25-30%.  Repeat echo in 8/16 with EF up to 35-40%.  Echo in 4/19 showed EF up to 45-50%.  However, echo in 4/21 showed EF back down to 30-35%.  LHC/RHC in 5/21 showed no CAD, normal filling pressures, preserved cardiac output.  Of note, Medtronic device had been reprogrammed to VVI as LBBB has been only intermittent  => more recently, LBBB had been persistent and CRT was reactivated in 9/21. Echo 3/23 showed EF 35-40%, diffuse hypokinesis, mildly decreased RV systolic function.  Echo (3/24) showed EF 35-40%. MDT CRT-D device was explanted in 1/25 due to pocket infection after generator change. She feels no different after CRT-D explantation.  NYHA class II probably, not volume overloaded on exam.  - Continue Toprol  XL 12.5 mg daily.  - Continue Lasix  40 mg daily + KCl 40.  - Increase Entresto  to 49/51 bid, BMET/BNP today and BMET again in 10 days.  - Continue spironolactone  25 mg daily.  - Continue dapagliflozin  10 mg daily.   - I will arrange for repeat echo.  QRS is not prolonged today so currently not CRT candidate.  If EF < 35%, will need  to discuss +/- replacement of ICD with her.  2. Hx of CVA: ? Embolic.  No atrial fibrillation has been detected on device before explant.  She will continue Plavix  for now. She will need to be anticoagulated if she has documented AF.   3. Hyperlipidemia: Goal LDL < 70 with history of CVA.  - Continue atorvastatin  80 mg daily, lipids acceptable in 6/25.   Followup 6 wks with APP (after echo).  I spent 32 minutes reviewing records, interviewing/examining patient, and managing orders.   Ezra Shuck  09/25/2023

## 2023-09-25 NOTE — Addendum Note (Signed)
 Encounter addended by: Rolan Ezra RAMAN, MD on: 09/25/2023 6:42 PM  Actions taken: Clinical Note Signed, Level of Service modified

## 2023-09-25 NOTE — Patient Instructions (Signed)
 INCREASE Entresto  to 49/51 mg Twice daily  Labs done today, your results will be available in MyChart, we will contact you for abnormal readings.  Repeat blood work in 10 days.  Your physician recommends that you schedule a follow-up appointment in: 6 weeks.  If you have any questions or concerns before your next appointment please send us  a message through [facility] or call our office at [phone].    TO LEAVE A MESSAGE FOR THE NURSE SELECT OPTION 2, PLEASE LEAVE A MESSAGE INCLUDING: YOUR NAME DATE OF BIRTH CALL BACK NUMBER REASON FOR CALL**this is important as we prioritize the call backs  YOU WILL RECEIVE A CALL BACK THE SAME DAY AS LONG AS YOU CALL BEFORE 4:00 PM  At the [facility], you and your health needs are our priority. As part of our continuing mission to provide you with exceptional heart care, we have created designated Provider Care Teams. These Care Teams include your primary Cardiologist (physician) and Advanced Practice Providers (APPs- Physician Assistants and Nurse Practitioners) who all work together to provide you with the care you need, when you need it.   You may see any of the following providers on your designated Care Team at your next follow up: Dr Toribio Fuel Dr Ezra Shuck Dr. Ria Commander Dr. Morene Brownie Amy Lenetta, NP Caffie Shed, [place] [facility] [place], [place] Beckey Coe, NP [place] Lee, NP Ellouise Class, NP Tinnie Redman, PharmD Jaun Bash, PharmD   Please be sure to bring in all your medications bottles to every appointment.    Thank you for choosing  [facility]

## 2023-10-05 ENCOUNTER — Inpatient Hospital Stay (HOSPITAL_COMMUNITY): Admission: RE | Admit: 2023-10-05 | Source: Ambulatory Visit

## 2023-10-12 ENCOUNTER — Other Ambulatory Visit (HOSPITAL_COMMUNITY)

## 2023-10-23 ENCOUNTER — Other Ambulatory Visit (HOSPITAL_COMMUNITY)

## 2023-10-30 ENCOUNTER — Ambulatory Visit (HOSPITAL_COMMUNITY)
Admission: RE | Admit: 2023-10-30 | Discharge: 2023-10-30 | Disposition: A | Discharge status: Home / Self Care | Source: Ambulatory Visit | Attending: Internal Medicine | Admitting: Internal Medicine

## 2023-10-30 DIAGNOSIS — I5022 Chronic systolic (congestive) heart failure: Secondary | ICD-10-CM | POA: Insufficient documentation

## 2023-10-30 DIAGNOSIS — I080 Rheumatic disorders of both mitral and aortic valves: Secondary | ICD-10-CM | POA: Diagnosis not present

## 2023-10-30 DIAGNOSIS — I447 Left bundle-branch block, unspecified: Secondary | ICD-10-CM | POA: Insufficient documentation

## 2023-10-30 LAB — BASIC METABOLIC PANEL WITH GFR
Anion gap: 11 (ref 5–15)
BUN: 20 mg/dL (ref 8–23)
CO2: 21 mmol/L — ABNORMAL LOW (ref 22–32)
Calcium: 8.9 mg/dL (ref 8.9–10.3)
Chloride: 102 mmol/L (ref 98–111)
Creatinine, Ser: 1.22 mg/dL — ABNORMAL HIGH (ref 0.44–1.00)
GFR, Estimated: 47 mL/min — ABNORMAL LOW (ref >60)
Glucose, Bld: 85 mg/dL (ref 70–99)
Potassium: 3.7 mmol/L (ref 3.5–5.1)
Sodium: 134 mmol/L — ABNORMAL LOW (ref 135–145)

## 2023-10-30 LAB — ECHOCARDIOGRAM COMPLETE
Area-P 1/2: 2.93 cm2
Calc EF: 49 %
S' Lateral: 3.5 cm
Single Plane A2C EF: 48.2 %
Single Plane A4C EF: 47.1 %

## 2023-10-30 NOTE — Progress Notes (Signed)
  Echocardiogram 2D Echocardiogram has been performed.  Norleen ORN [facility] 10/30/2023, 11:32 AM

## 2023-11-01 ENCOUNTER — Other Ambulatory Visit (HOSPITAL_COMMUNITY): Payer: Self-pay | Admitting: Cardiology

## 2023-11-06 ENCOUNTER — Encounter (HOSPITAL_COMMUNITY)

## 2023-11-19 ENCOUNTER — Telehealth (HOSPITAL_COMMUNITY): Payer: Self-pay

## 2023-11-19 NOTE — Telephone Encounter (Signed)
 Called to confirm/remind patient of their appointment at the [facility] on 11/20/23.   Appointment:   [x] Confirmed  [] Left mess   [] No answer/No voice mail  [] VM Full/unable to leave message  [] Phone not in service  Patient reminded to bring all medications and/or complete list.  Confirmed patient has transportation. Gave directions, instructed to utilize valet parking.

## 2023-11-19 NOTE — Progress Notes (Signed)
 Patient ID: [name], female   DOB: Jun 29, 1950, 73 y.o.   MRN: [id number]     PCP: Vicci Barnie NOVAK, MD Neurologist: Dr. Rosemarie EP: Dr. Fernande HF Cardiologist: Dr. Rolan   HPI: [name] is a 73 y.o. female with chronic systolic CHF with nonischemic cardiomypathy (no significant CAD) s/p Medtronic CRT-D, LBBB, mild-mod MR and CVA (09/2013).  Coronary angiography in 7/15 showed no significant CAD.  Admitted in 9/15 with right sided facial droop and expressive aphasia, CVA on MRI. Had TEE showing EF 15%.    QRS narrowed, so in 11/16 device was reprogrammed to VVI.   Echo 12/17: EF up to 45-50%.  Echo 3/19: EF 45-50% with moderate LVH and normal RV.   Echo 4/21 showed EF decreased to 30-35% with LAD territory WMA.  LHC/RHC in 5/21 showed normal filling pressures and cardiac output, no significant CAD.  With persistent LBBB, CRT was activated by Dr. Fernande in 9/21.   Echo 3/23: EF 35-40%, diffuse HKN, mildly decreased RV systolic function.   Echo 3/24: EF 35-40%, global HKN, mildly reduced RV systolic function.  S/p CRT-D generator change out in 1/25 then ultimately required ICD explant due to to pocket infection with Staph capitis. Required IV cefazolin  via PICC.   Echo 10/25 showed improved EF 45-50%, normal RV  Today she returns for HF follow up. Overall feeling fine. Has URI and cough, but otherwise no undue dyspnea. No SOB walking on flat ground or with ADLs. Walks ~ 2 miles/day for exercise. Denies palpitations, abnormal bleeding, CP, dizziness, edema, or PND/Orthopnea. Appetite ok. Weight at home 171 pounds. Taking all medications.   ECG (personally reviewed):  none ordered today  Labs (1/24): K 3.7, creatinine 0.99 Labs (3/24): K 3.7, creatinine 0.95, LDL 66 Labs (8/24): K 3.9, creatinine 0.96 Labs (1/25): K 3.3, creatinine 1.06 Labs (6/25): K 3.9, creatinine 1.04, LDL 69, BNP 20 Labs (10/25): K 3.7, creatinine 1.22  PMH: 1. Nonischemic cardiomyopathy: Echo (7/15)  with severe LV dilation, EF 25% with diffuse hypokinesis, mild to moderate MR.  RHC/ LHC 08/08/13 with RA 4, PA 23/8, PCWP 11, CI 2.52; no CAD.  TEE (9/15) with EF 15-20%, severe global hypokinesis, no LV thrombus, moderate MR, normal RV.  Echo (10/2013): EF 25-30%, grade I DD, mild AI. Medtronic CRT-D (10/15).  Echo (2/16) with EF 25-30%, diffuse hypokinesis worse in the septum, grade I diastolic dysfunction, normal RV size and systolic function, mild-moderate MR.  - Echo (8/16) with EF 35-40%, diffuse hypokinesis, mild-moderate mitral regurgitation.  - Echo (12/17): EF 45-50%, mild LVH, mild to moderate MR.  - Echo (3/19): EF 45-50% with moderate LVH, mild MR, normal RV size and systolic function.  - Echo (4/21): EF 30-35% with LAD territory wall motion abnormalities. - LHC/RHC (5/21): No significant CAD; mean RA 6, PA 31/13, mean PCWP 13, CI 3.37.  - Echo (3/23): EF 35-40%, diffuse hypokinesis, mildly decreased RV systolic function. - Echo (3/24): EF 35-40%, grade I DD, RV mildly reduced - Echo (10/25): EF 45-50%, RV normal, mild MR 2. CVA: 9/15 with dysarthria.  Carotids 9/15 with no significant CAD.  - Carotid dopplers (10/17): mild plaque, normal subclavians.  3. LBBB: Medtronic CRT-D (10/2013) - s/p device explant 1/25 due to pocket infection. 4. Hyperlipidemia 5. Cholelithiasis 6. Peripheral arterial dopplers (6/19): Normal  SH: lives with her son and husband. Does not drink or smoke. Currently not working.   FH: Mom had A fib and Heart Failure. Deceased 12/06/1994  Sister has lupus  ROS: All systems negative except as listed in HPI, PMH and Problem List.  Current Outpatient Medications  Medication Sig Dispense Refill   albuterol  (VENTOLIN  HFA) 108 (90 Base) MCG/ACT inhaler Inhale 2 puffs into the lungs at bedtime as needed for wheezing or shortness of breath. 18 g 3   atorvastatin  (LIPITOR) 80 MG tablet Take 1 tablet (80 mg total) by mouth daily at 6 PM. 90 tablet 3   cetirizine   (ZYRTEC ) 10 MG tablet Take 1 tablet (10 mg total) by mouth daily. 60 tablet 3   clopidogrel  (PLAVIX ) 75 MG tablet Take 1 tablet (75 mg total) by mouth daily. 90 tablet 3   FARXIGA  10 MG TABS tablet TAKE ONE TABLET (10mg ) BY MOUTH EVERY DAY BEFORE breakfast 90 tablet 3   fluticasone  (FLONASE ) 50 MCG/ACT nasal spray Place 2 sprays into both nostrils daily. 48 g 1   furosemide  (LASIX ) 40 MG tablet Take 1 tablet (40 mg total) by mouth daily.     metoprolol  succinate (TOPROL  XL) 25 MG 24 hr tablet Take 0.5 tablets (12.5 mg total) by mouth daily. 90 tablet 1   pantoprazole  (PROTONIX ) 40 MG tablet Take 1 tablet (40 mg total) by mouth daily. 60 tablet 3   potassium chloride  SA (KLOR-CON  M) 20 MEQ tablet Take 2 tablets (40 mEq total) by mouth daily. 180 tablet 3   sacubitril -valsartan  (ENTRESTO ) 49-51 MG Take 1 tablet by mouth 2 (two) times daily. 60 tablet 11   spironolactone  (ALDACTONE ) 25 MG tablet Take 1 tablet (25 mg total) by mouth daily. 90 tablet 3   No current facility-administered medications for this encounter.   BP 122/80   Pulse 78   Ht 5' 6 (1.676 m)   Wt 79.2 kg (174 lb 9.6 oz)   SpO2 98%   BMI 28.18 kg/m   Wt Readings from Last 3 Encounters:  11/20/23 79.2 kg (174 lb 9.6 oz)  09/25/23 79.4 kg (175 lb)  06/27/23 78.1 kg (172 lb 3.2 oz)   PHYSICAL EXAM: General:  NAD. No resp difficulty, walked into clinic with cane, elderly HEENT: Normal Neck: Supple. No JVD. Cor: Regular rate & rhythm. No rubs, gallops or murmurs. Lungs: Clear Abdomen: Soft, nontender, nondistended.  Extremities: No cyanosis, clubbing, rash, edema Neuro: Alert & oriented x 3, moves all 4 extremities w/o difficulty. Affect pleasant.  ASSESSMENT & PLAN: 1. Chronic Systolic Heart Failure: Nonischemic cardiomyopathy s/p Medtronic CRT-D in 10/15, coronary angiography without CAD in 07/2013. Echo 2/16, EF 25-30%.  Repeat echo in 8/16 with EF up to 35-40%.  Echo in 4/19 showed EF up to 45-50%.  However, echo in  4/21 showed EF back down to 30-35%.  LHC/RHC in 5/21 showed no CAD, normal filling pressures, preserved cardiac output.  Of note, Medtronic device had been reprogrammed to VVI as LBBB has been only intermittent => more recently, LBBB had been persistent and CRT was reactivated in 9/21. Echo 3/23 showed EF 35-40%, diffuse hypokinesis, mildly decreased RV systolic function.  Echo (3/24) showed EF 35-40%. MDT CRT-D device was explanted in 1/25 due to pocket infection after generator change. She feels no different after CRT-D explantation. Echo 10/25 showed EF 45-50%, normal RV. NYHA I-II, not volume overloaded on exam.  - Continue Toprol  XL 12.5 mg daily.  - Continue Lasix  40 mg daily + 40 KCl daily. Labs reviewed from 10/30/23 and are stable, K 3.7, creatinine 1.22 - Continue Entresto  49/51 bid. - Continue spironolactone  25 mg daily.  - Continue  dapagliflozin  10 mg daily.   2. Hx of CVA: ? Embolic.  No atrial fibrillation has been detected on device before explant.  She will continue Plavix  for now. She will need to be anticoagulated if she has documented AF.   3. Hyperlipidemia: Goal LDL < 70 with history of CVA.  - Continue atorvastatin  80 mg daily, lipids acceptable in 6/25.   Follow up in 4 months with APP.  Harlene HERO [facility] FNP-BC 11/20/2023

## 2023-11-20 ENCOUNTER — Encounter (HOSPITAL_COMMUNITY): Payer: Self-pay

## 2023-11-20 ENCOUNTER — Ambulatory Visit (HOSPITAL_COMMUNITY)
Admission: RE | Admit: 2023-11-20 | Discharge: 2023-11-20 | Disposition: A | Discharge status: Home / Self Care | Source: Ambulatory Visit | Attending: Family Medicine | Admitting: Family Medicine

## 2023-11-20 VITALS — BP 122/80 | HR 78 | Ht 66.0 in | Wt 174.6 lb

## 2023-11-20 DIAGNOSIS — I340 Nonrheumatic mitral (valve) insufficiency: Secondary | ICD-10-CM | POA: Diagnosis not present

## 2023-11-20 DIAGNOSIS — Z7902 Long term (current) use of antithrombotics/antiplatelets: Secondary | ICD-10-CM | POA: Diagnosis not present

## 2023-11-20 DIAGNOSIS — I5022 Chronic systolic (congestive) heart failure: Secondary | ICD-10-CM | POA: Insufficient documentation

## 2023-11-20 DIAGNOSIS — Z8673 Personal history of transient ischemic attack (TIA), and cerebral infarction without residual deficits: Secondary | ICD-10-CM | POA: Insufficient documentation

## 2023-11-20 DIAGNOSIS — I447 Left bundle-branch block, unspecified: Secondary | ICD-10-CM | POA: Diagnosis not present

## 2023-11-20 DIAGNOSIS — Z79899 Other long term (current) drug therapy: Secondary | ICD-10-CM | POA: Insufficient documentation

## 2023-11-20 DIAGNOSIS — I428 Other cardiomyopathies: Secondary | ICD-10-CM | POA: Diagnosis not present

## 2023-11-20 DIAGNOSIS — E785 Hyperlipidemia, unspecified: Secondary | ICD-10-CM | POA: Diagnosis not present

## 2023-11-20 DIAGNOSIS — R059 Cough, unspecified: Secondary | ICD-10-CM | POA: Diagnosis not present

## 2023-11-20 DIAGNOSIS — J069 Acute upper respiratory infection, unspecified: Secondary | ICD-10-CM | POA: Diagnosis not present

## 2023-11-20 NOTE — Addendum Note (Signed)
 Encounter addended by: Berna Gitto B, RN on: 11/20/2023 8:38 AM  Actions taken: Clinical Note Signed

## 2023-11-20 NOTE — Patient Instructions (Signed)
   Follow-Up in: ON 03/25/2023 AS SCHEDULED   At [facility], you and your health needs are our priority. We have a designated team specialized in the treatment of Heart Failure. This Care Team includes your primary Heart Failure Specialized Cardiologist (physician), Advanced Practice Providers (APPs- Physician Assistants and Nurse Practitioners), and Pharmacist who all work together to provide you with the care you need, when you need it.   You may see any of the following providers on your designated Care Team at your next follow up:  Dr. Toribio Fuel Dr. Ezra Shuck Dr. Odis Brownie Greig Mosses, NP Caffie Shed, [place] [facility] [place], [place] Beckey Coe, NP Jordan Lee, NP Tinnie Redman, PharmD   Please be sure to bring in all your medications bottles to every appointment.   Need to Contact Us :  If you have any questions or concerns before your next appointment please send us  a message through [place] or call our office at [phone].    TO LEAVE A MESSAGE FOR THE NURSE SELECT OPTION 2, PLEASE LEAVE A MESSAGE INCLUDING: YOUR NAME DATE OF BIRTH CALL BACK NUMBER REASON FOR CALL**this is important as we prioritize the call backs  YOU WILL RECEIVE A CALL BACK THE SAME DAY AS LONG AS YOU CALL BEFORE 4:00 PM

## 2023-12-06 ENCOUNTER — Other Ambulatory Visit: Payer: Self-pay | Admitting: Internal Medicine

## 2023-12-06 NOTE — Telephone Encounter (Signed)
 Copied from CRM #[id number]. Topic: Clinical - Medication Refill >> Dec 06, 2023 12:41 PM Victoria B wrote: Medication: pantoprazole  (PROTONIX ) 40 MG tablet  Has the patient contacted their pharmacy? yes (Agent: If yes, when and what did the pharmacy advise?)contact pcp  This is the patient's preferred pharmacy:  [facility] - [place], [place] - [address] [place] [address] Phone: [phone] Fax: [phone]   Is this the correct pharmacy for this prescription? yes    Has the prescription been filled recently? no  Is the patient out of the medication? yes  Has the patient been seen for an appointment in the last year OR does the patient have an upcoming appointment? yes  Can we respond through MyChart? yes  Agent: Please be advised that Rx refills may take up to 3 business days. We ask that you follow-up with your pharmacy.

## 2023-12-08 MED ORDER — PANTOPRAZOLE SODIUM 40 MG PO TBEC: 40.0000 mg | DELAYED_RELEASE_TABLET | Freq: Every day | ORAL | 0 refills | Status: DC

## 2023-12-08 NOTE — Telephone Encounter (Signed)
 Requested Prescriptions  Pending Prescriptions Disp Refills   pantoprazole  (PROTONIX ) 40 MG tablet 90 tablet 0    Sig: Take 1 tablet (40 mg total) by mouth daily.     Gastroenterology: Proton Pump Inhibitors Passed - 12/08/2023  8:56 AM      Passed - Valid encounter within last 12 months    Recent Outpatient Visits           9 months ago Non-ischemic cardiomyopathy (HCC)   [place] [facility] [place]. [facility] Barnie NOVAK, MD   1 year ago Non-ischemic cardiomyopathy [facility])   [place] [facility] Of [place]. [facility] Brien Belvie BRAVO, MD   1 year ago Acute bronchitis, unspecified organism   [place] [facility] - [facility] Dept Of [place]. [facility] Brien Belvie BRAVO, MD   1 year ago Heart failure with reduced ejection fraction [facility])- s/p ICD and pacemaker     [facility] - A Dept Of [place]. [facility] Brien Belvie BRAVO, MD   2 years ago Encounter for [employer] annual wellness exam    [facility] [place] - A Dept Of [place]. [facility] Brien Belvie BRAVO, MD

## 2023-12-10 ENCOUNTER — Other Ambulatory Visit: Payer: Self-pay | Admitting: Family Medicine

## 2023-12-10 DIAGNOSIS — I6340 Cerebral infarction due to embolism of unspecified cerebral artery: Secondary | ICD-10-CM

## 2023-12-11 NOTE — Telephone Encounter (Signed)
 Requested medications are due for refill today.  yes  Requested medications are on the active medications list.  yes  Last refill. 12/18/2022 #90 3 rf  Future visit scheduled.   yes  Notes to clinic.  Expired labs - pt is overdue for an OV    Requested Prescriptions  Pending Prescriptions Disp Refills   clopidogrel  (PLAVIX ) 75 MG tablet [Pharmacy Med Name: clopidogrel  75 mg tablet] 90 tablet 3    Sig: TAKE ONE TABLET (75mg 0 BY MOUTH EVERY DAY     Hematology: Antiplatelets - clopidogrel  Failed - 12/11/2023  3:50 PM      Failed - HCT in normal range and within 180 days    HCT  Date Value Ref Range Status  02/10/2023 31.5 (L) 36.0 - 46.0 % Final   Hematocrit  Date Value Ref Range Status  01/08/2023 39.1 34.0 - 46.6 % Final         Failed - HGB in normal range and within 180 days    Hemoglobin  Date Value Ref Range Status  02/10/2023 10.3 (L) 12.0 - 15.0 g/dL Final  87/76/7975 87.4 11.1 - 15.9 g/dL Final         Failed - PLT in normal range and within 180 days    Platelets  Date Value Ref Range Status  02/10/2023 237 150 - 400 K/uL Final  01/08/2023 297 150 - 450 x10E3/uL Final         Failed - Cr in normal range and within 360 days    Creat  Date Value Ref Range Status  01/25/2023 0.98 0.60 - 1.00 mg/dL Final   Creatinine, Ser  Date Value Ref Range Status  10/30/2023 1.22 (H) 0.44 - 1.00 mg/dL Final         Failed - Valid encounter within last 6 months    Recent Outpatient Visits           9 months ago Non-ischemic cardiomyopathy (HCC)   [place] [facility] Of [place]. [facility] Vic[facility] Barnie NOVAK, MD   1 year ago Non-ischemic cardiomyopathy [facility])   [place] [facility] - [facility] Dept Of [place]. [facility] Brien Belvie BRAVO, MD   1 year ago Acute bronchitis, unspecified organism   [place] [facility] - A Dept Of [place]. [facility] Brien Belvie BRAVO, MD   1 year ago Heart failure  with reduced ejection fraction [facility])- s/p ICD and pacemaker    [place] [facility] - A Dept Of [place]. [facility] Brien Belvie BRAVO, MD   2 years ago Encounter for [employer] annual wellness exam   [place] [facility] [place] - A Dept Of [place]. [facility] Brien Belvie BRAVO, MD

## 2023-12-25 ENCOUNTER — Other Ambulatory Visit: Payer: Self-pay | Admitting: Internal Medicine

## 2023-12-25 ENCOUNTER — Other Ambulatory Visit (HOSPITAL_COMMUNITY): Payer: Self-pay | Admitting: Family Medicine

## 2023-12-28 ENCOUNTER — Ambulatory Visit: Admitting: Internal Medicine

## 2024-01-09 ENCOUNTER — Other Ambulatory Visit: Payer: Self-pay | Admitting: Internal Medicine

## 2024-01-09 DIAGNOSIS — Z8673 Personal history of transient ischemic attack (TIA), and cerebral infarction without residual deficits: Secondary | ICD-10-CM

## 2024-01-11 NOTE — Telephone Encounter (Signed)
 Requested Prescriptions  Pending Prescriptions Disp Refills   atorvastatin  (LIPITOR) 80 MG tablet [Pharmacy Med Name: atorvastatin  80 mg tablet] 90 tablet 1    Sig: Take 1 tablet (80 mg total) by mouth daily at 6 PM.     Cardiovascular:  Antilipid - Statins Failed - 01/11/2024  2:21 PM      Failed - Lipid Panel in normal range within the last 12 months    Cholesterol  Date Value Ref Range Status  06/27/2023 128 0 - 200 mg/dL Final   LDL Cholesterol  Date Value Ref Range Status  06/27/2023 69 0 - 99 mg/dL Final    Comment:           Total Cholesterol/HDL:CHD Risk Coronary Heart Disease Risk Table                     Men   Women  1/2 Average Risk   3.4   3.3  Average Risk       5.0   4.4  2 X Average Risk   9.6   7.1  3 X Average Risk  23.4   11.0        Use the calculated Patient Ratio above and the CHD Risk Table to determine the patient's CHD Risk.        ATP III CLASSIFICATION (LDL):  <100     mg/dL   Optimal  899-870  mg/dL   Near or Above                    Optimal  130-159  mg/dL   Borderline  839-810  mg/dL   High  >809     mg/dL   Very High Performed at [facility], [address] [place], [place] [address]    HDL  Date Value Ref Range Status  06/27/2023 37 (L) >40 mg/dL Final   Triglycerides  Date Value Ref Range Status  06/27/2023 110 <150 mg/dL Final         Passed - Patient is not pregnant      Passed - Valid encounter within last 12 months    Recent Outpatient Visits           10 months ago Non-ischemic cardiomyopathy (HCC)   [place] [facility] Of [place]. [facility] Vicci Barnie NOVAK, MD   1 year ago Non-ischemic cardiomyopathy [facility])   [place] [facility] - A Dept Of [place]. [facility] Brien Belvie BRAVO, MD   1 year ago Acute bronchitis, unspecified organism   [place] [facility] - A Dept Of [place]. [facility] Brien Belvie BRAVO, MD   1 year ago Heart  failure with reduced ejection fraction [facility])- s/p ICD and pacemaker    [place] [facility] - A Dept Of [place]. [facility] Brien Belvie BRAVO, MD   2 years ago Encounter for [employer] annual wellness exam   [place] [facility] [place] - A Dept Of [place]. [facility] Brien Belvie BRAVO, MD

## 2024-01-29 ENCOUNTER — Ambulatory Visit: Admitting: Internal Medicine

## 2024-03-11 ENCOUNTER — Ambulatory Visit: Payer: 59

## 2024-03-24 ENCOUNTER — Ambulatory Visit (HOSPITAL_COMMUNITY)

## 2024-03-28 ENCOUNTER — Ambulatory Visit: Payer: Self-pay | Admitting: Internal Medicine
